# Patient Record
Sex: Male | Born: 1955
Health system: Southern US, Community
[De-identification: ages and names within clinical notes are randomized; demographics above are authoritative.]

## PROBLEM LIST (undated history)

## (undated) DIAGNOSIS — A549 Gonococcal infection, unspecified: Secondary | ICD-10-CM

## (undated) DIAGNOSIS — F200 Paranoid schizophrenia: Secondary | ICD-10-CM

## (undated) DIAGNOSIS — K5900 Constipation, unspecified: Secondary | ICD-10-CM

## (undated) DIAGNOSIS — R2689 Other abnormalities of gait and mobility: Secondary | ICD-10-CM

## (undated) DIAGNOSIS — IMO0002 Reserved for concepts with insufficient information to code with codable children: Secondary | ICD-10-CM

## (undated) DIAGNOSIS — F4310 Post-traumatic stress disorder, unspecified: Secondary | ICD-10-CM

## (undated) DIAGNOSIS — Z7409 Other reduced mobility: Secondary | ICD-10-CM

## (undated) DIAGNOSIS — C801 Malignant (primary) neoplasm, unspecified: Secondary | ICD-10-CM

## (undated) DIAGNOSIS — Z789 Other specified health status: Secondary | ICD-10-CM

## (undated) DIAGNOSIS — N319 Neuromuscular dysfunction of bladder, unspecified: Secondary | ICD-10-CM

## (undated) DIAGNOSIS — G8929 Other chronic pain: Secondary | ICD-10-CM

## (undated) HISTORY — PX: BACK SURGERY: SHX140

---

## 2006-12-13 ENCOUNTER — Emergency Department (HOSPITAL_COMMUNITY): Admission: EM | Admit: 2006-12-13 | Discharge: 2006-12-14 | Payer: Self-pay | Admitting: Emergency Medicine

## 2006-12-13 IMAGING — CR DG CHEST 2V
2 series · 2 of 2 positions shown · non-contrast
Comparison: none

CLINICAL DATA: Swelling in legs.  Weakness.  Fatigue. 
 CHEST ? 2 VIEW:

[w chest pa]
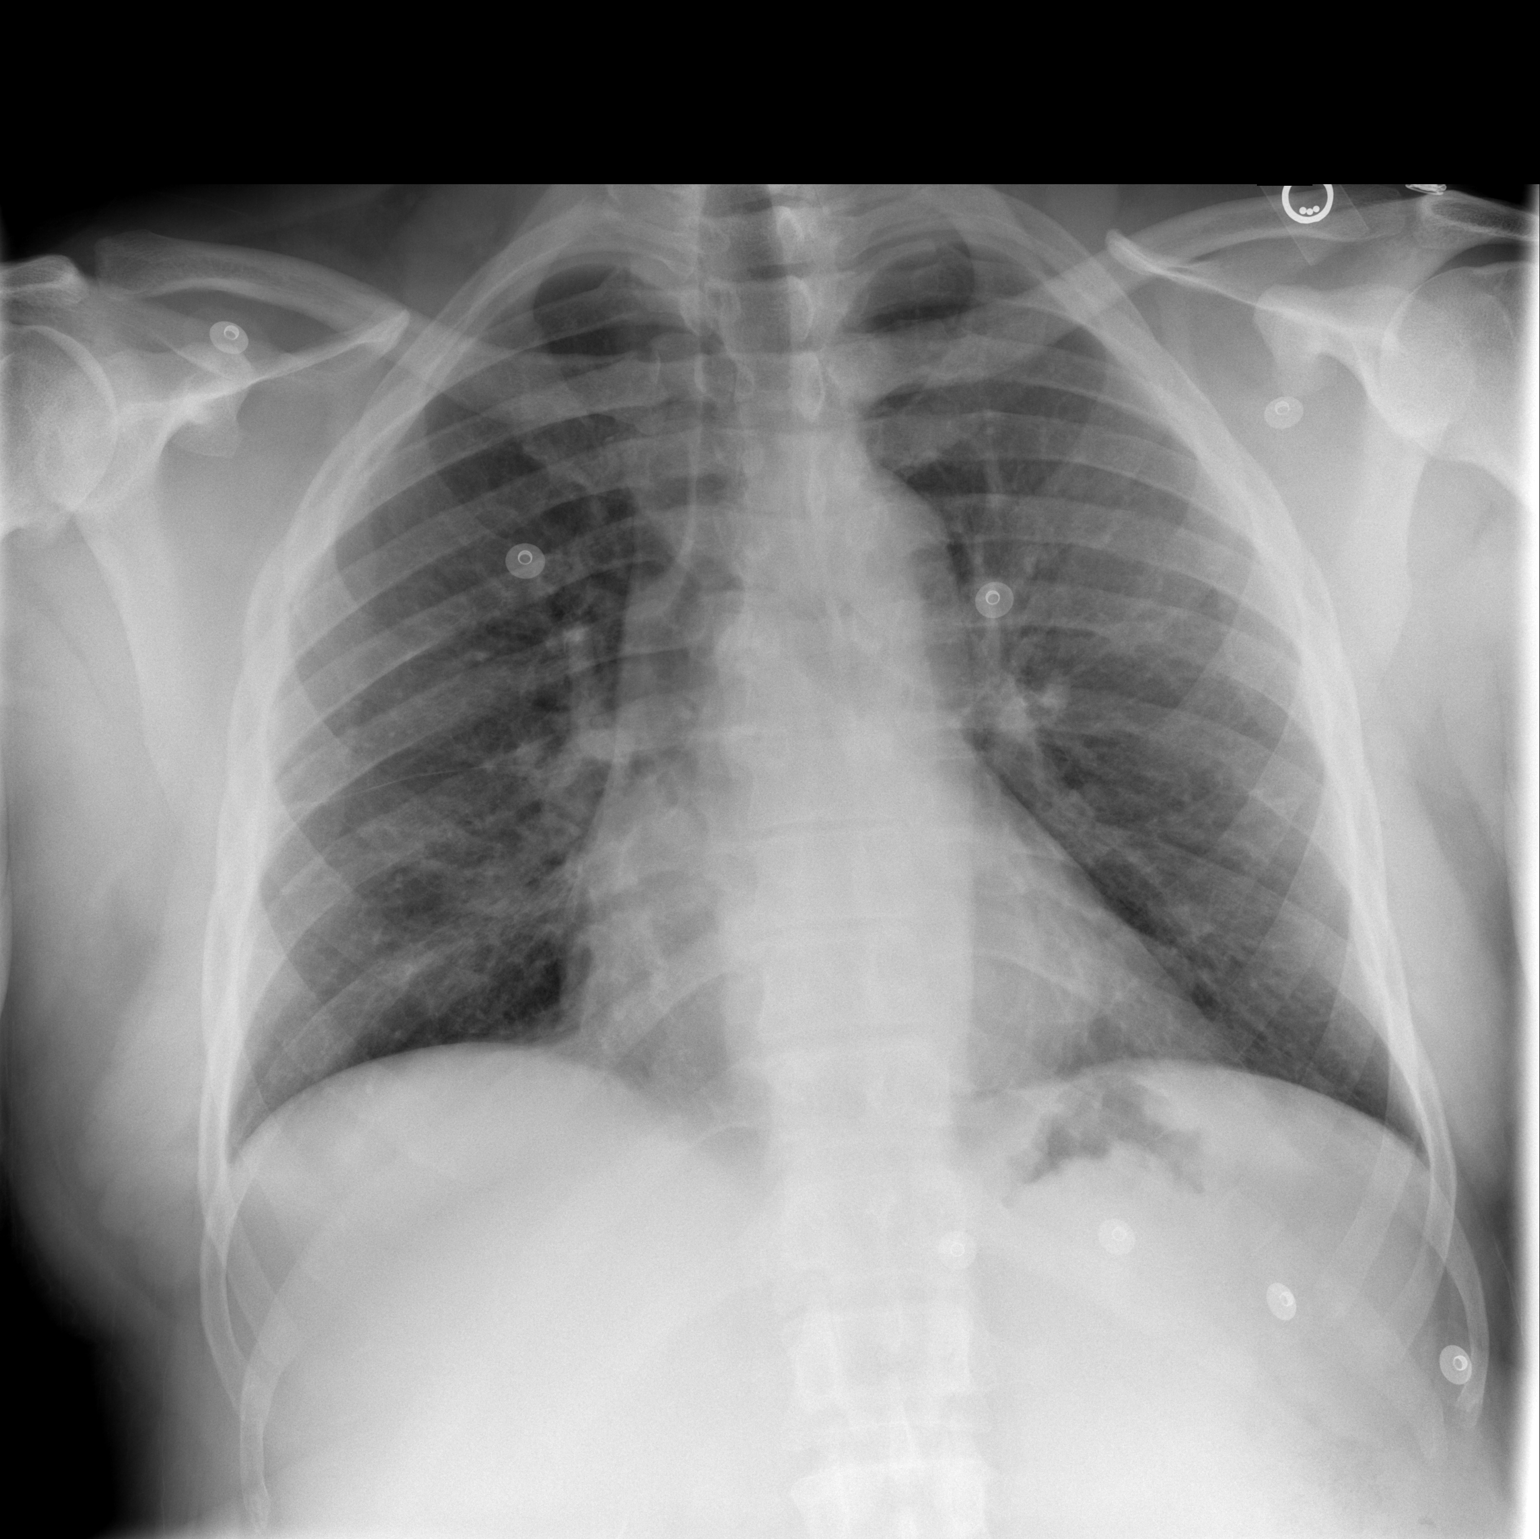

[w chest lat]
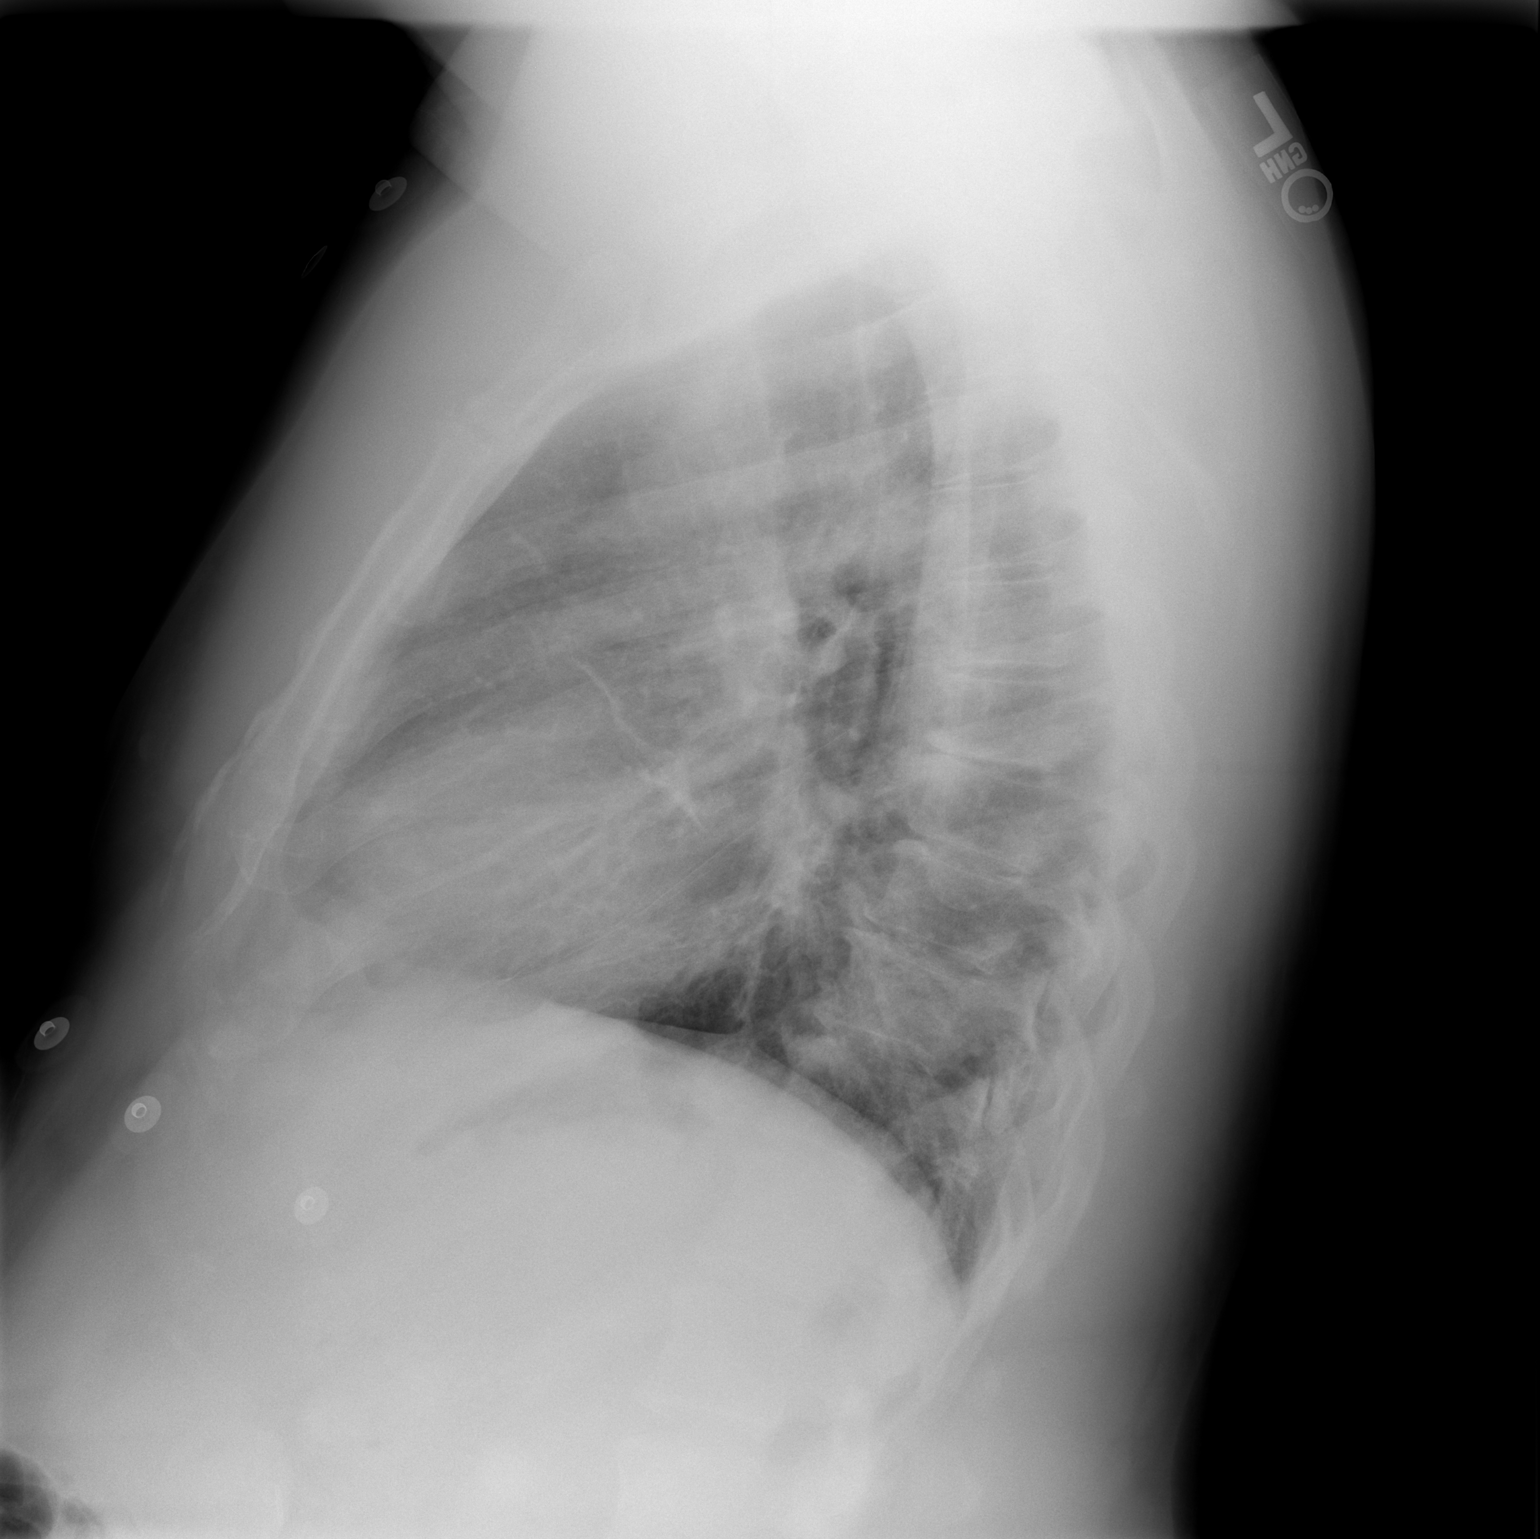

[2 of 2 positions shown; findings below may reference images not displayed]

FINDINGS: Two views of the chest show no active infiltrate or effusion.  The heart is within normal limits in size.   Mild degenerative changes are noted in the thoracic spine.
IMPRESSION: No active lung disease.

## 2011-03-03 LAB — COMPREHENSIVE METABOLIC PANEL
ALT: 30
Albumin: 3.6
Alkaline Phosphatase: 58
BUN: 10
Calcium: 9
Potassium: 3.6
Sodium: 138
Total Protein: 6.4

## 2011-03-03 LAB — DIFFERENTIAL
Basophils Relative: 0
Lymphs Abs: 1.6
Monocytes Absolute: 0.5
Monocytes Relative: 9
Neutro Abs: 2.8

## 2011-03-03 LAB — CBC
Platelets: 248
RDW: 13.4

## 2011-03-03 LAB — B-NATRIURETIC PEPTIDE (CONVERTED LAB): Pro B Natriuretic peptide (BNP): <30

## 2011-03-03 LAB — URINALYSIS, ROUTINE W REFLEX MICROSCOPIC
Protein, ur: NEGATIVE
Urobilinogen, UA: 0.2

## 2011-03-03 LAB — POCT CARDIAC MARKERS: CKMB, poc: 5.1

## 2016-01-21 ENCOUNTER — Emergency Department (HOSPITAL_COMMUNITY)
Admission: EM | Admit: 2016-01-21 | Discharge: 2016-01-21 | Disposition: A | Discharge status: Home / Self Care | Payer: Non-veteran care | Attending: Emergency Medicine | Admitting: Emergency Medicine

## 2016-01-21 ENCOUNTER — Encounter (HOSPITAL_COMMUNITY): Payer: Self-pay | Admitting: Emergency Medicine

## 2016-01-21 DIAGNOSIS — G8929 Other chronic pain: Secondary | ICD-10-CM | POA: Diagnosis not present

## 2016-01-21 DIAGNOSIS — H9202 Otalgia, left ear: Secondary | ICD-10-CM | POA: Insufficient documentation

## 2016-01-21 DIAGNOSIS — M545 Low back pain: Secondary | ICD-10-CM | POA: Diagnosis present

## 2016-01-21 DIAGNOSIS — M549 Dorsalgia, unspecified: Secondary | ICD-10-CM

## 2016-01-21 HISTORY — DX: Malignant (primary) neoplasm, unspecified: C80.1

## 2016-01-21 HISTORY — DX: Gonococcal infection, unspecified: A54.9

## 2016-01-21 HISTORY — DX: Reserved for concepts with insufficient information to code with codable children: IMO0002

## 2016-01-21 NOTE — ED Notes (Signed)
Bed: [id number] Expected date:  Expected time:  Means of arrival:  Comments: EMS- 60yo M, chronic back pain/CA Pt

## 2016-01-21 NOTE — ED Notes (Signed)
Pt ambulated into hallway and asked directions to the bus stop.  Pt reports that he will take the bus to [facility] and knows how to get home from there.  Security to take Pt to bus stop.

## 2016-01-21 NOTE — ED Provider Notes (Signed)
[facility] DEPT Provider Note   CSN: [id number] Arrival date & time: 01/21/16  0746     History   Chief Complaint Chief Complaint  Patient presents with  . Back Pain    HPI Timothy [name] is a 60 y.o. male.  60 yo M with a cc of back pain.  Has a hx of chronic back pain.  On arrival to the ED symptoms resolved. Now requesting I look in his ears.     The history is provided by the patient.  Back Pain   This is a new problem. The current episode started less than 1 hour ago. The problem occurs constantly. The problem has not changed since onset.The pain is associated with no known injury. The pain is present in the lumbar spine. The quality of the pain is described as aching. The pain does not radiate. The pain is at a severity of 6/10. The pain is mild. The symptoms are aggravated by bending. The pain is worse during the day. Pertinent negatives include no chest pain, no fever, no headaches and no abdominal pain. He has tried nothing for the symptoms. The treatment provided no relief.    Past Medical History:  Diagnosis Date  . Behavioral disorder   . Cancer ([place])   . Gonorrhea     There are no active problems to display for this patient.   History reviewed. No pertinent surgical history.     Home Medications    Prior to Admission medications   Not on File    Family History No family history on file.  Social History Social History  Substance Use Topics  . Smoking status: Never Smoker  . Smokeless tobacco: Never Used  . Alcohol use No     Allergies   Review of patient's allergies indicates not on file.   Review of Systems Review of Systems  Constitutional: Negative for chills and fever.  HENT: Negative for congestion and facial swelling.   Eyes: Negative for discharge and visual disturbance.  Respiratory: Negative for shortness of breath.   Cardiovascular: Negative for chest pain and palpitations.  Gastrointestinal: Negative for abdominal pain,  diarrhea and vomiting.  Musculoskeletal: Positive for back pain. Negative for arthralgias and myalgias.  Skin: Negative for color change and rash.  Neurological: Negative for tremors, syncope and headaches.  Psychiatric/Behavioral: Negative for confusion and dysphoric mood.     Physical Exam Updated Vital Signs BP 128/85 (BP Location: Left Arm)   Pulse 86   Temp 98.6 F (37 C) (Oral)   Resp 16   SpO2 97%   Physical Exam  Constitutional: He is oriented to person, place, and time. He appears well-developed and well-nourished.  HENT:  Head: Normocephalic and atraumatic.  Left TM normal  Eyes: Conjunctivae and EOM are normal. Pupils are equal, round, and reactive to light.  Neck: Normal range of motion. No JVD present.  Cardiovascular: Normal rate and regular rhythm.   Pulmonary/Chest: Effort normal. No stridor. No respiratory distress.  Abdominal: He exhibits no distension. There is no tenderness. There is no guarding.  Musculoskeletal: Normal range of motion. He exhibits no edema.  Neurological: He is alert and oriented to person, place, and time.  Skin: Skin is warm and dry.  Psychiatric: He has a normal mood and affect. His behavior is normal.     ED Treatments / Results  Labs (all labs ordered are listed, but only abnormal results are displayed) Labs Reviewed - No data to display  EKG  EKG  Interpretation None       Radiology No results found.  Procedures Procedures (including critical care time)  Medications Ordered in ED Medications - No data to display   Initial Impression / Assessment and Plan / ED Course  I have reviewed the triage vital signs and the nursing notes.  Pertinent labs & imaging results that were available during my care of the patient were reviewed by me and considered in my medical decision making (see chart for details).  Clinical Course    60 yo M with a cc of low back pain. Pain resolved spontaneously prior to my eval.  Patient  with chronic left ear pain but no other complaints, will have follow up with his PCP.   8:09 AM:  I have discussed the diagnosis/risks/treatment options with the patient and believe the pt to be eligible for discharge home to follow-up with PCP. We also discussed returning to the ED immediately if new or worsening sx occur. We discussed the sx which are most concerning (e.g., sudden worsening pain, fever, inability to tolerate by mouth) that necessitate immediate return. Medications administered to the patient during their visit and any new prescriptions provided to the patient are listed below.  Medications given during this visit Medications - No data to display   The patient appears reasonably screen and/or stabilized for discharge and I doubt any other medical condition or other [facility] requiring further screening, evaluation, or treatment in the ED at this time prior to discharge.    Final Clinical Impressions(s) / ED Diagnoses   Final diagnoses:  Otalgia of left ear  Chronic back pain    New Prescriptions New Prescriptions   No medications on file     Deno Etienne, DO 01/21/16 0809

## 2016-01-21 NOTE — ED Triage Notes (Signed)
Per EMS, Pt sts he fell a couple of days ago and hurts his knees and legs. Denies back injury at the time but today c/o back pain and L shoulder pain - at an incision site where he had "back cancer removed" a few years ago. Hx of chronic back pain. Pt also wants to be tested for AIDS. Pt sts he thinks he has AIDS because his caregiver gave him a black and yellow pill. A&Ox4.

## 2016-01-21 NOTE — ED Notes (Signed)
Upon assessment, pt denies any pain at this time. Sts "My pain is gone miraculously. It went away when you took my blood pressure." Pt A&O. Unsure of baseline, When told Dr. Tyrone Nine was the doctor today pt sts "Sleepy floyd?"  Pt sts, "I do think I have AIDS because I've been having lots of stupid sex."

## 2016-01-21 NOTE — ED Notes (Signed)
Pt asking staff to contact caregiver prior to transport.  Staff attempted to call number provided and it went to voicemail.  Pt informed and verbalized understanding.

## 2016-01-31 ENCOUNTER — Emergency Department (HOSPITAL_COMMUNITY)
Admission: EM | Admit: 2016-01-31 | Discharge: 2016-02-02 | Disposition: A | Discharge status: Home / Self Care | Payer: Non-veteran care | Attending: Emergency Medicine | Admitting: Emergency Medicine

## 2016-01-31 ENCOUNTER — Encounter (HOSPITAL_COMMUNITY): Payer: Self-pay | Admitting: Emergency Medicine

## 2016-01-31 DIAGNOSIS — Z0000 Encounter for general adult medical examination without abnormal findings: Secondary | ICD-10-CM

## 2016-01-31 DIAGNOSIS — F200 Paranoid schizophrenia: Secondary | ICD-10-CM | POA: Diagnosis present

## 2016-01-31 DIAGNOSIS — N390 Urinary tract infection, site not specified: Secondary | ICD-10-CM | POA: Insufficient documentation

## 2016-01-31 DIAGNOSIS — K5900 Constipation, unspecified: Secondary | ICD-10-CM

## 2016-01-31 DIAGNOSIS — Z79899 Other long term (current) drug therapy: Secondary | ICD-10-CM | POA: Insufficient documentation

## 2016-01-31 LAB — CBC WITH DIFFERENTIAL/PLATELET
BASOS ABS: 0 10*3/uL (ref 0.0–0.1)
BASOS PCT: 1 %
Eosinophils Absolute: 0.4 10*3/uL (ref 0.0–0.7)
Eosinophils Relative: 6 %
HEMATOCRIT: 40.1 % (ref 39.0–52.0)
HEMOGLOBIN: 13.7 g/dL (ref 13.0–17.0)
LYMPHS PCT: 17 %
Lymphs Abs: 1.3 10*3/uL (ref 0.7–4.0)
MCH: 29.5 pg (ref 26.0–34.0)
MCHC: 34.2 g/dL (ref 30.0–36.0)
MCV: 86.2 fL (ref 78.0–100.0)
MONOS PCT: 7 %
Monocytes Absolute: 0.5 10*3/uL (ref 0.1–1.0)
NEUTROS ABS: 5.4 10*3/uL (ref 1.7–7.7)
NEUTROS PCT: 69 %
Platelets: 264 10*3/uL (ref 150–400)
RBC: 4.65 MIL/uL (ref 4.22–5.81)
RDW: 13.9 % (ref 11.5–15.5)
WBC: 7.7 10*3/uL (ref 4.0–10.5)

## 2016-01-31 LAB — URINALYSIS, ROUTINE W REFLEX MICROSCOPIC
Bilirubin Urine: NEGATIVE
Glucose, UA: NEGATIVE mg/dL
Ketones, ur: NEGATIVE mg/dL
Nitrite: POSITIVE — AB
PROTEIN: NEGATIVE mg/dL
Specific Gravity, Urine: 1.019 (ref 1.005–1.030)
pH: 6.5 (ref 5.0–8.0)

## 2016-01-31 LAB — COMPREHENSIVE METABOLIC PANEL
ALBUMIN: 3.9 g/dL (ref 3.5–5.0)
ALT: 15 U/L — ABNORMAL LOW (ref 17–63)
ANION GAP: 6 (ref 5–15)
AST: 15 U/L (ref 15–41)
Alkaline Phosphatase: 94 U/L (ref 38–126)
BILIRUBIN TOTAL: 0.6 mg/dL (ref 0.3–1.2)
BUN: 7 mg/dL (ref 6–20)
CHLORIDE: 103 mmol/L (ref 101–111)
CO2: 29 mmol/L (ref 22–32)
Calcium: 9.1 mg/dL (ref 8.9–10.3)
Creatinine, Ser: 1.11 mg/dL (ref 0.61–1.24)
GFR calc Af Amer: >60 mL/min (ref >60)
GFR calc non Af Amer: >60 mL/min (ref >60)
GLUCOSE: 103 mg/dL — AB (ref 65–99)
POTASSIUM: 3.9 mmol/L (ref 3.5–5.1)
Sodium: 138 mmol/L (ref 135–145)
TOTAL PROTEIN: 7.3 g/dL (ref 6.5–8.1)

## 2016-01-31 LAB — RAPID URINE DRUG SCREEN, HOSP PERFORMED
Amphetamines: NOT DETECTED
BARBITURATES: NOT DETECTED
BENZODIAZEPINES: NOT DETECTED
COCAINE: NOT DETECTED
Opiates: NOT DETECTED
TETRAHYDROCANNABINOL: POSITIVE — AB

## 2016-01-31 LAB — URINE MICROSCOPIC-ADD ON

## 2016-01-31 LAB — CBG MONITORING, ED: GLUCOSE-CAPILLARY: 90 mg/dL (ref 65–99)

## 2016-01-31 LAB — ETHANOL

## 2016-01-31 MED ORDER — PANTOPRAZOLE SODIUM 40 MG PO TBEC
40.0000 mg | DELAYED_RELEASE_TABLET | Freq: Every day | ORAL | Status: DC
  Administered 2016-02-01 – 2016-02-02 (×2): 40 mg via ORAL
  Filled 2016-01-31 (×2): qty 1

## 2016-01-31 MED ORDER — CALCIUM CARBONATE-VITAMIN D 500-200 MG-UNIT PO TABS
1.0000 | ORAL_TABLET | Freq: Every day | ORAL | Status: DC
  Administered 2016-02-01 – 2016-02-02 (×2): 1 via ORAL
  Filled 2016-01-31 (×2): qty 1

## 2016-01-31 MED ORDER — POLYETHYLENE GLYCOL 3350 17 G PO PACK
17.0000 g | PACK | ORAL | Status: DC
  Administered 2016-02-01: 17 g via ORAL
  Filled 2016-01-31: qty 1

## 2016-01-31 MED ORDER — BISACODYL 5 MG PO TBEC
5.0000 mg | DELAYED_RELEASE_TABLET | Freq: Every day | ORAL | Status: DC | PRN
  Administered 2016-02-02: 5 mg via ORAL
  Filled 2016-01-31 (×2): qty 1

## 2016-01-31 MED ORDER — DIPHENHYDRAMINE HCL 25 MG PO CAPS
25.0000 mg | ORAL_CAPSULE | Freq: Every evening | ORAL | Status: DC | PRN
  Administered 2016-02-01 (×2): 25 mg via ORAL
  Filled 2016-01-31 (×2): qty 1

## 2016-01-31 MED ORDER — SULFAMETHOXAZOLE-TRIMETHOPRIM 800-160 MG PO TABS
1.0000 | ORAL_TABLET | Freq: Two times a day (BID) | ORAL | Status: DC
  Administered 2016-02-01 – 2016-02-02 (×4): 1 via ORAL
  Filled 2016-01-31 (×4): qty 1

## 2016-01-31 MED ORDER — BACLOFEN 5 MG HALF TABLET
5.0000 mg | ORAL_TABLET | Freq: Three times a day (TID) | ORAL | Status: DC | PRN
  Filled 2016-01-31: qty 1

## 2016-01-31 MED ORDER — LACTULOSE 10 GM/15ML PO SOLN
20.0000 g | ORAL | Status: DC
  Administered 2016-02-01: 20 g via ORAL
  Filled 2016-01-31: qty 30

## 2016-01-31 MED ORDER — DIAZEPAM 5 MG PO TABS: 2.5000 mg | ORAL_TABLET | Freq: Every evening | ORAL | Status: DC | PRN

## 2016-01-31 MED ORDER — OXYBUTYNIN CHLORIDE 5 MG PO TABS
5.0000 mg | ORAL_TABLET | Freq: Three times a day (TID) | ORAL | Status: DC | PRN
  Filled 2016-01-31: qty 1

## 2016-01-31 MED ORDER — HALOPERIDOL 5 MG PO TABS
5.0000 mg | ORAL_TABLET | Freq: Every day | ORAL | Status: DC
  Administered 2016-02-01 (×2): 5 mg via ORAL
  Filled 2016-01-31 (×2): qty 1

## 2016-01-31 NOTE — ED Notes (Signed)
Bed: WLPT4 Expected date:  Expected time:  Means of arrival:  Comments: 

## 2016-01-31 NOTE — ED Provider Notes (Signed)
[facility] DEPT Provider Note   CSN: [id number] Arrival date & time: 01/31/16  1749     History   Chief Complaint Chief Complaint  Patient presents with  . Altered Mental Status    HPI Timothy [name] is a 60 y.o. male.  Pt presents to the ED with altered mental status.  The pt's friend is with him and she said that he is usually seen at the [facility] for his schizophrenia.  The pt said that he has been taking his meds as directed, but his friend said that the [place] has been changing his meds around.  She said that he's been seeing things and has been calling the bank multiple times to check and see if he still has money in his account.  He thinks people are stealing from him.  She said that he has been giving it away.        Past Medical History:  Diagnosis Date  . Behavioral disorder   . Cancer ([place])   . Gonorrhea     There are no active problems to display for this patient.   History reviewed. No pertinent surgical history.     Home Medications    Prior to Admission medications   Medication Sig Start Date End Date Taking? Authorizing Provider  baclofen (LIORESAL) 10 MG tablet Take 5 mg by mouth 3 (three) times daily as needed for muscle spasms.   Yes Historical Provider, MD  bisacodyl (BISACODYL) 5 MG EC tablet Take 5 mg by mouth daily as needed for moderate constipation.   Yes Historical Provider, MD  Calcium Carb-Cholecalciferol (CALCIUM-VITAMIN D) 500-400 MG-UNIT TABS Take 1 tablet by mouth daily.   Yes Historical Provider, MD  diazepam (VALIUM) 5 MG tablet Take 2.5 mg by mouth at bedtime as needed for anxiety.   Yes Historical Provider, MD  diphenhydrAMINE (BENADRYL) 25 mg capsule Take 25 mg by mouth at bedtime as needed for itching or sleep.   Yes Historical Provider, MD  haloperidol (HALDOL) 5 MG tablet Take 5 mg by mouth at bedtime.   Yes Historical Provider, MD  haloperidol decanoate (HALDOL DECANOATE) 50 MG/ML injection Inject 50 mg into the muscle every  28 (twenty-eight) days.   Yes Historical Provider, MD  lactulose (CHRONULAC) 10 GM/15ML solution Take 20 g by mouth 3 (three) times a week.   Yes Historical Provider, MD  omeprazole (PRILOSEC) 20 MG capsule Take 40 mg by mouth 2 (two) times daily before a meal.   Yes Historical Provider, MD  oxybutynin (DITROPAN) 5 MG tablet Take 5 mg by mouth 3 (three) times daily as needed for bladder spasms.   Yes Historical Provider, MD  polyethylene glycol (MIRALAX / GLYCOLAX) packet Take 17 g by mouth 3 (three) times a week.   Yes Historical Provider, MD  pseudoephedrine (SUDAFED) 30 MG tablet Take 30 mg by mouth every 6 (six) hours as needed for congestion.   Yes Historical Provider, MD  VIAGRA 100 MG tablet Take 100 mg by mouth as needed for erectile dysfunction.  01/24/16  Yes Historical Provider, MD    Family History No family history on file.  Social History Social History  Substance Use Topics  . Smoking status: Never Smoker  . Smokeless tobacco: Never Used  . Alcohol use No     Allergies   Penicillins   Review of Systems Review of Systems  Psychiatric/Behavioral: Positive for confusion.  All other systems reviewed and are negative.    Physical Exam Updated Vital Signs  BP 133/91 (BP Location: Right Arm)   Pulse 76   Temp 98.5 F (36.9 C) (Oral)   Resp 16   Ht 6\' 2"  (1.88 m)   Wt 250 lb (113.4 kg)   SpO2 98%   BMI 32.10 kg/m   Physical Exam  Constitutional: He is oriented to person, place, and time. He appears well-developed and well-nourished.  HENT:  Head: Normocephalic and atraumatic.  Right Ear: External ear normal.  Left Ear: External ear normal.  Nose: Nose normal.  Mouth/Throat: Oropharynx is clear and moist.  Eyes: Conjunctivae and EOM are normal. Pupils are equal, round, and reactive to light.  Neck: Normal range of motion. Neck supple.  Cardiovascular: Normal rate, regular rhythm, normal heart sounds and intact distal pulses.   Pulmonary/Chest: Effort normal  and breath sounds normal.  Abdominal: Soft. Bowel sounds are normal.  Musculoskeletal: Normal range of motion.  Neurological: He is alert and oriented to person, place, and time.  Psychiatric: His speech is tangential. He is slowed. Thought content is paranoid. Cognition and memory are impaired.  Nursing note and vitals reviewed.    ED Treatments / Results  Labs (all labs ordered are listed, but only abnormal results are displayed) Labs Reviewed  COMPREHENSIVE METABOLIC PANEL - Abnormal; Notable for the following:       Result Value   Glucose, Bld 103 (*)    ALT 15 (*)    All other components within normal limits  URINE RAPID DRUG SCREEN, HOSP PERFORMED - Abnormal; Notable for the following:    Tetrahydrocannabinol POSITIVE (*)    All other components within normal limits  URINALYSIS, ROUTINE W REFLEX MICROSCOPIC (NOT AT [facility]) - Abnormal; Notable for the following:    APPearance CLOUDY (*)    Hgb urine dipstick SMALL (*)    Nitrite POSITIVE (*)    Leukocytes, UA SMALL (*)    All other components within normal limits  URINE MICROSCOPIC-ADD ON - Abnormal; Notable for the following:    Squamous Epithelial / LPF 0-5 (*)    Bacteria, UA MANY (*)    All other components within normal limits  ETHANOL  CBC WITH DIFFERENTIAL/PLATELET  CBG MONITORING, ED    EKG  EKG Interpretation None       Radiology No results found.  Procedures Procedures (including critical care time)  Medications Ordered in ED Medications  baclofen (LIORESAL) tablet 5 mg (not administered)  bisacodyl (DULCOLAX) EC tablet 5 mg (not administered)  Calcium-Vitamin D 500-400 MG-UNIT TABS 1 tablet (not administered)  diazepam (VALIUM) tablet 2.5 mg (not administered)  diphenhydrAMINE (BENADRYL) capsule 25 mg (not administered)  haloperidol (HALDOL) tablet 5 mg (not administered)  lactulose (CHRONULAC) 10 GM/15ML solution 20 g (not administered)  pantoprazole (PROTONIX) EC tablet 40 mg (not  administered)  oxybutynin (DITROPAN) tablet 5 mg (not administered)  polyethylene glycol (MIRALAX / GLYCOLAX) packet 17 g (not administered)  sulfamethoxazole-trimethoprim (BACTRIM DS,SEPTRA DS) 800-160 MG per tablet 1 tablet (not administered)     Initial Impression / Assessment and Plan / ED Course  I have reviewed the triage vital signs and the nursing notes.  Pertinent labs & imaging results that were available during my care of the patient were reviewed by me and considered in my medical decision making (see chart for details).  Clinical Course   Consult placed for TTS.  Pt will be treated for his UTI with Bactrim.    Pt signed out to Dr. Florina Ou pending TTS consult.  Final Clinical Impressions(s) / ED  Diagnoses   Final diagnoses:  Paranoid schizophrenia ([place])  UTI (lower urinary tract infection)    New Prescriptions New Prescriptions   No medications on file     Isla Pence, MD 02/05/16 1349

## 2016-01-31 NOTE — ED Triage Notes (Addendum)
Here via [facility] from car in parking lot. EMS reports altered mental status. Pt denies pain or injury. States that he has been under a lot of stress lately and called 911 because he felt like his BP was high and his heart was racing. Hx of schizophrenia and pt states he takes haldol daily and that he took his dose today. Patient with some confusion in triage.

## 2016-02-01 DIAGNOSIS — F200 Paranoid schizophrenia: Secondary | ICD-10-CM | POA: Diagnosis present

## 2016-02-01 MED ORDER — DICLOFENAC SODIUM 1 % TD GEL
2.0000 g | Freq: Four times a day (QID) | TRANSDERMAL | Status: DC
  Administered 2016-02-01 – 2016-02-02 (×2): 2 g via TOPICAL
  Filled 2016-02-01 (×2): qty 100

## 2016-02-01 NOTE — BHH Counselor (Addendum)
Clinician attempted assessment however pt was too drowsy. Clinician spoke to [place], [place] and asked her to removed TTS consult and resubmit once pt wakes.   Edd Fabian, MS, [facility], [facility] Triage Specialist [phone]

## 2016-02-01 NOTE — ED Notes (Signed)
Assumed care of patient from T. Waters, Therapist, sports.  Patient ambulated from hall to room with walker.  Patient self-cathed himself.  Placed on hospital bed.  Patient alert, cooperative and pleasant.

## 2016-02-01 NOTE — BH Assessment (Addendum)
[facility] Assessment Progress Note [employer] is at capacity per [facility] is at capacity per [place], [place] is at capacity per [facility] is at capacity per [employer]. Sent pt's referral information to [place], [place], [place], [place], [place], Teacher, music, [place], [place].

## 2016-02-01 NOTE — ED Notes (Signed)
Pt performing in and out cath on self, NA at bedside

## 2016-02-01 NOTE — ED Notes (Signed)
Pt's caregiver brought patient food to eat. RN allowed grapes, watermelon, and soda. RN explained to caregiver that outside food isn't allowed per policy but would allow pt to have fruit and drink. Caregiver also brought Voltaren cream from home and placed on pt's knees.

## 2016-02-01 NOTE — ED Notes (Signed)
Bed: WA30 Expected date:  Expected time:  Means of arrival:  Comments: 

## 2016-02-01 NOTE — ED Notes (Signed)
Patient's belongings placed in locker 41.
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
## 2016-02-02 ENCOUNTER — Emergency Department (HOSPITAL_COMMUNITY): Payer: Self-pay

## 2016-02-02 ENCOUNTER — Encounter (HOSPITAL_COMMUNITY): Payer: Self-pay | Admitting: Registered Nurse

## 2016-02-02 DIAGNOSIS — N390 Urinary tract infection, site not specified: Secondary | ICD-10-CM | POA: Insufficient documentation

## 2016-02-02 DIAGNOSIS — F200 Paranoid schizophrenia: Secondary | ICD-10-CM

## 2016-02-02 DIAGNOSIS — K5900 Constipation, unspecified: Secondary | ICD-10-CM

## 2016-02-02 IMAGING — CR DG CHEST 2V
2 series · 2 of 2 positions shown · non-contrast
Comparison: Chest radiograph [DATE]

CLINICAL DATA: Patient with tachycardia.

EXAM:
CHEST  2 VIEW

[w chest pa]
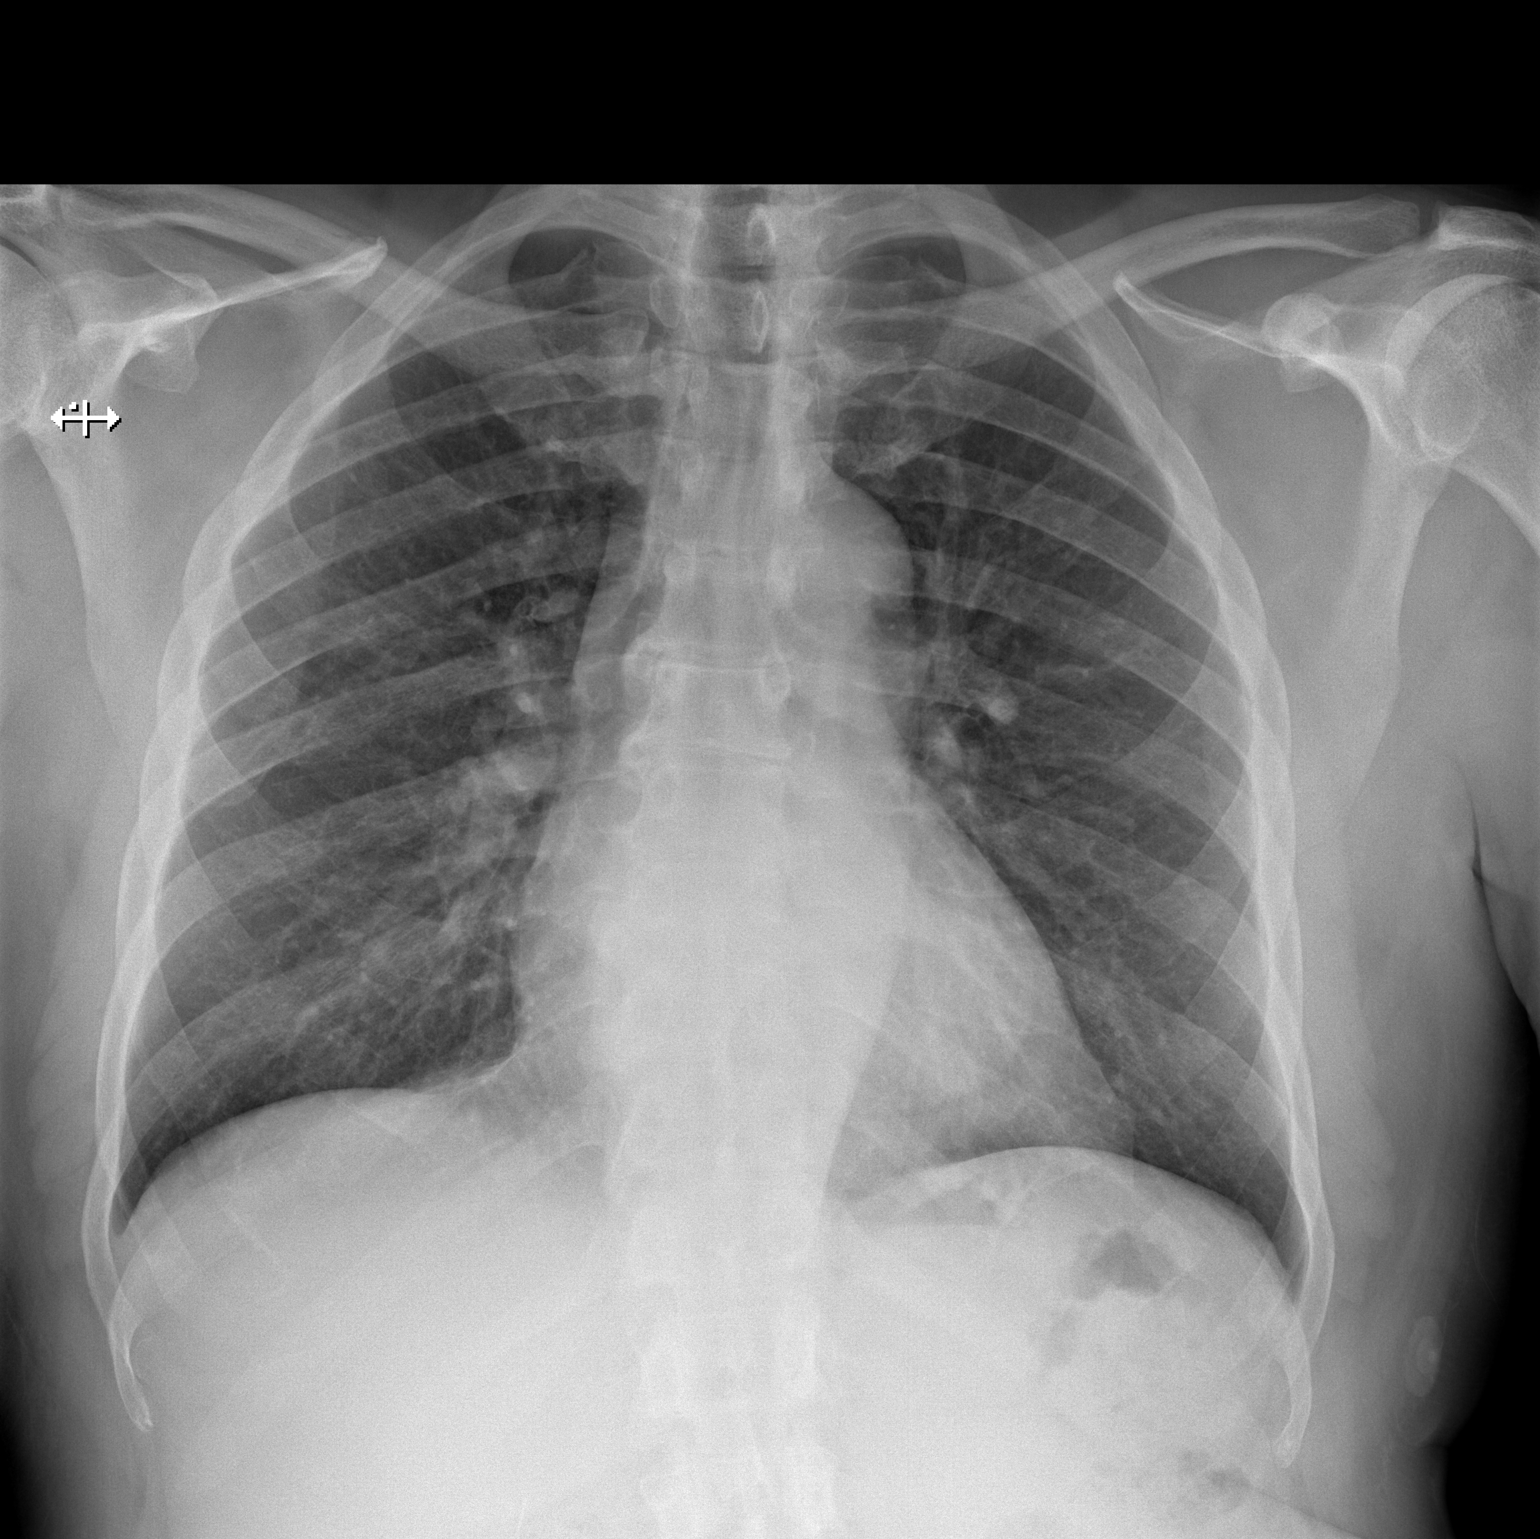

[w chest lat]
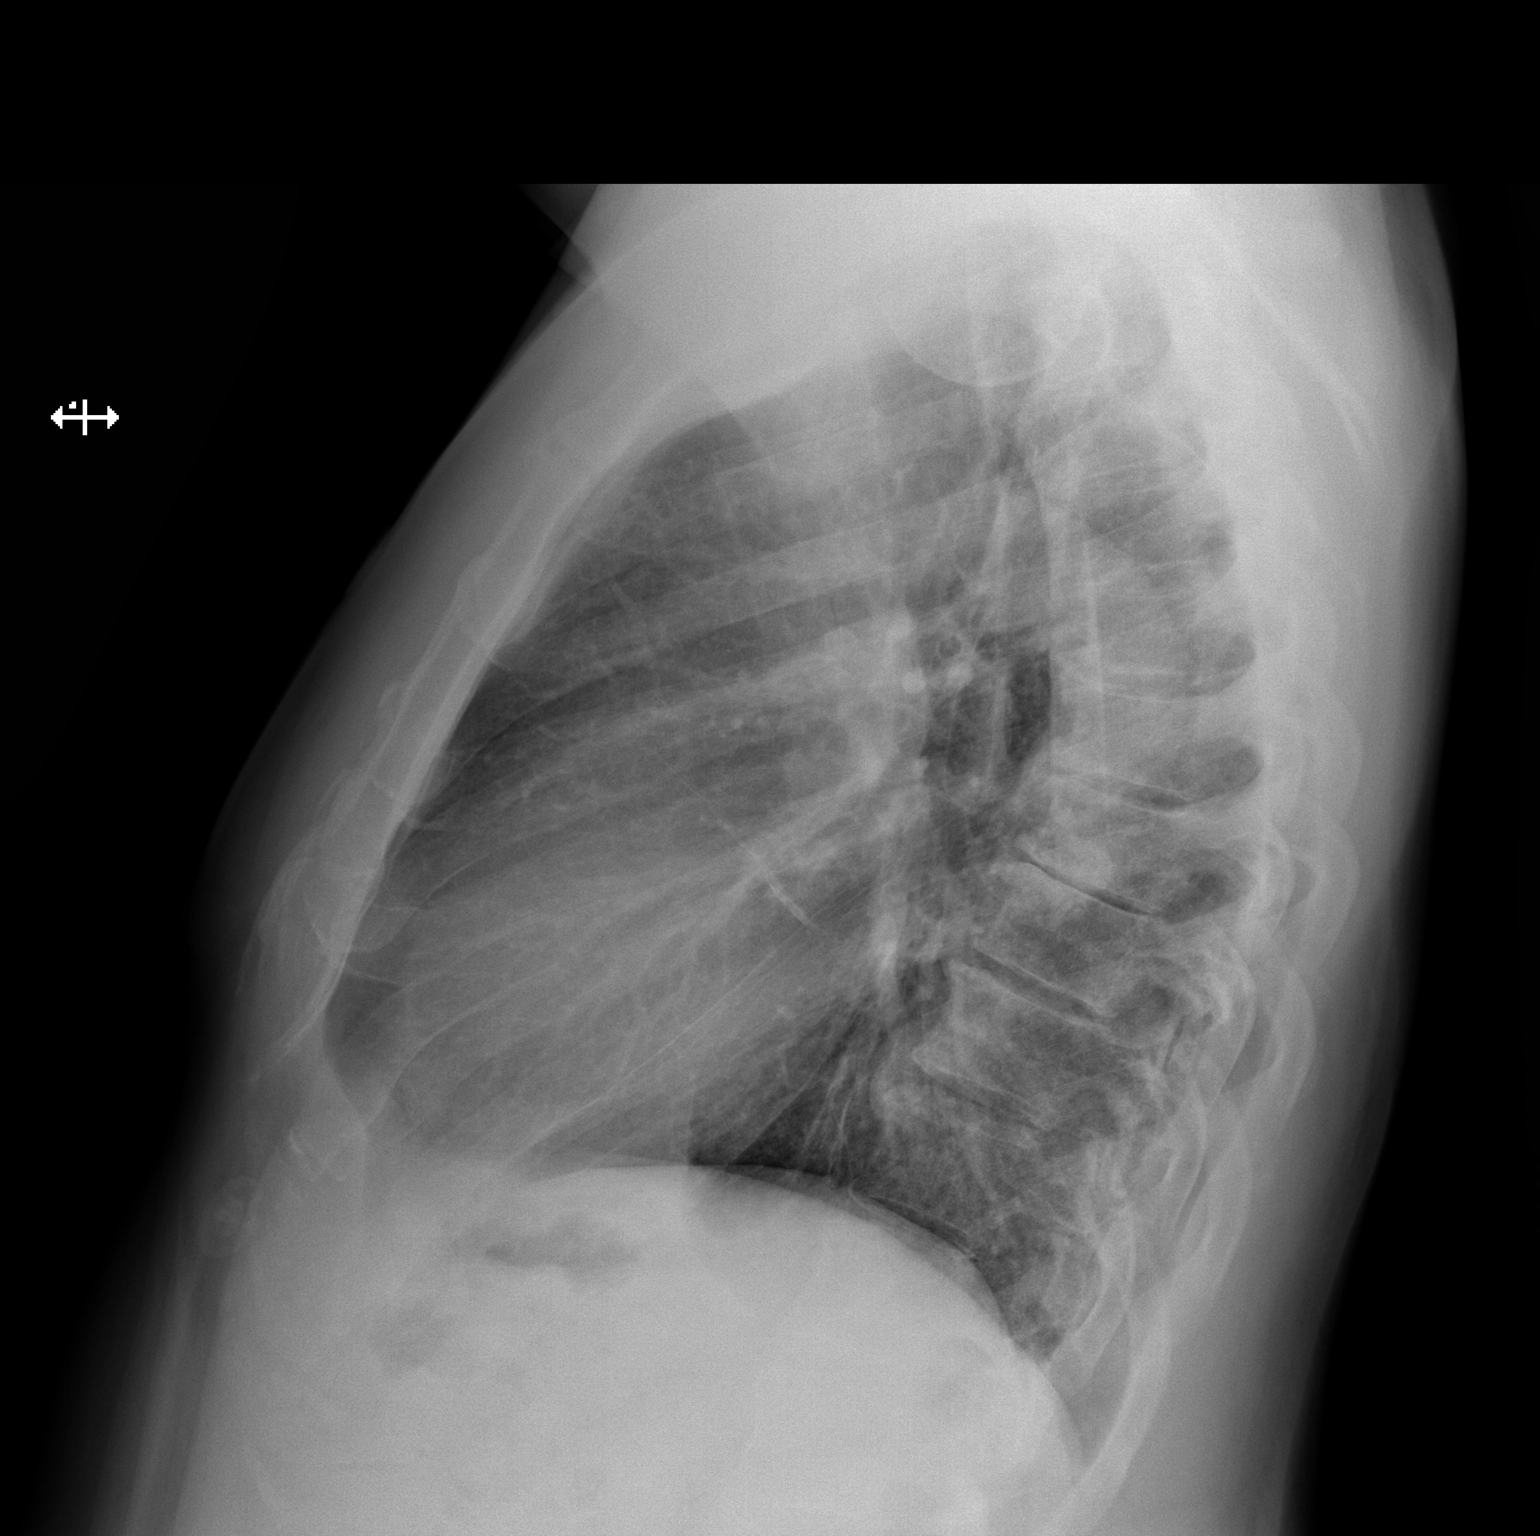

[2 of 2 positions shown; findings below may reference images not displayed]

FINDINGS: Normal cardiac contours. Mild tortuosity of the thoracic aorta. No
consolidative pulmonary opacities. No pleural effusion pneumothorax.
Thoracic spine degenerative changes.
IMPRESSION: No active cardiopulmonary disease.

## 2016-02-02 MED ORDER — SULFAMETHOXAZOLE-TRIMETHOPRIM 800-160 MG PO TABS: 1.0000 | ORAL_TABLET | Freq: Two times a day (BID) | ORAL | 0 refills | Status: DC

## 2016-02-02 MED ORDER — POLYVINYL ALCOHOL 1.4 % OP SOLN
1.0000 [drp] | OPHTHALMIC | Status: DC | PRN
  Filled 2016-02-02: qty 15

## 2016-02-02 NOTE — ED Notes (Signed)
Pt ate 50% of his breakfast 

## 2016-02-02 NOTE — Consult Note (Signed)
[place] Psychiatry Consult   Reason for Consult:  Altered Mental Status Referring Physician:  EDP Patient Identification: Timothy [name] MRN:  [id number] Principal Diagnosis: Constipation Diagnosis:   Patient Active Problem List   Diagnosis Date Noted  . Constipation [K59.00] 02/02/2016  . Paranoid schizophrenia ([place]) [F20.0] 02/01/2016    Total Time spent with patient: 45 minutes  Subjective:   Timothy [name] is a 60 y.o. male patient presented to [facility] via EMS with a friend who stated that patient was not himself and is usually treated at the [place].  HPI:  Timothy [name] 60 y.o. male  Patient seen by Dr. Darleene Cleaver and this provider.  Chart reviewed 02/02/16.   On evaluation:  [name] reports that he was having some constipation and called EMS.  Patient alert, oriented, calm, and cooperative during interview.  Patient denies suicidal/homicidal ideation, psychosis, and paranoia.  Patient voices that his main concern is being constipated.  Reports that he lives with male friend for 63 yrs.      Past Psychiatric History: Reports that he does have a prior history of psychiatric admission in various hospitals and states.  Denies prior history of suicidal ideation/attempt  Risk to Self: Suicidal Ideation: No Suicidal Intent: No Is patient at risk for suicide?: No Suicidal Plan?: No Access to Means: No What has been your use of drugs/alcohol within the last 12 months?: see above Intentional Self Injurious Behavior: None Risk to Others: Homicidal Ideation: No Thoughts of Harm to Others: No Current Homicidal Intent: No Current Homicidal Plan: No Access to Homicidal Means: No History of harm to others?: No Assessment of Violence: None Noted Does patient have access to weapons?: No Criminal Charges Pending?: No Does patient have a court date: No Prior Inpatient Therapy: Prior Inpatient Therapy: Yes Prior Therapy Dates: last IP stay was several years ago Prior  [facility]): [facility] Reason for Treatment: psychosomatic symptoms; delusions Prior Outpatient Therapy: Prior Outpatient Therapy: No Does patient have an ACCT team?: Yes Does patient have Intensive In-House Services?  : No Does patient have [facility] services? : No Does patient have P4CC services?: No  Past Medical History:  Past Medical History:  Diagnosis Date  . Behavioral disorder   . Cancer ([place])   . Gonorrhea    History reviewed. No pertinent surgical history. Family History: History reviewed. No pertinent family history. Family Psychiatric  History: Denies Social History:  History  Alcohol Use No     History  Drug use: Unknown    Social History   Social History  . Marital status: Single    Spouse name: N/A  . Number of children: N/A  . Years of education: N/A   Social History Main Topics  . Smoking status: Never Smoker  . Smokeless tobacco: Never Used  . Alcohol use No  . Drug use: Unknown  . Sexual activity: Not Asked   Other Topics Concern  . None   Social History Narrative  . None   Additional Social History:    Allergies:   Allergies  Allergen Reactions  . Penicillins Anaphylaxis    Has patient had a PCN reaction causing immediate rash, facial/tongue/throat swelling, SOB or lightheadedness with hypotension: Yes Has patient had a PCN reaction causing severe rash involving mucus membranes or skin necrosis: No Has patient had a PCN reaction that required hospitalization: Yes Has patient had a PCN reaction occurring within the last 10 years: No If all of the above answers are "NO", then may  proceed with Cephalosporin use.     Labs:  Results for orders placed or performed during the hospital encounter of 01/31/16 (from the past 48 hour(s))  CBG monitoring, ED     Status: None   Collection Time: 01/31/16  9:04 PM  Result Value Ref Range   Glucose-Capillary 90 65 - 99 mg/dL   Comment 1 Notify RN   Comprehensive metabolic panel      Status: Abnormal   Collection Time: 01/31/16  9:53 PM  Result Value Ref Range   Sodium 138 135 - 145 mmol/L   Potassium 3.9 3.5 - 5.1 mmol/L   Chloride 103 101 - 111 mmol/L   CO2 29 22 - 32 mmol/L   Glucose, Bld 103 (H) 65 - 99 mg/dL   BUN 7 6 - 20 mg/dL   Creatinine, Ser 1.11 0.61 - 1.24 mg/dL   Calcium 9.1 8.9 - 10.3 mg/dL   Total Protein 7.3 6.5 - 8.1 g/dL   Albumin 3.9 3.5 - 5.0 g/dL   AST 15 15 - 41 U/L   ALT 15 (L) 17 - 63 U/L   Alkaline Phosphatase 94 38 - 126 U/L   Total Bilirubin 0.6 0.3 - 1.2 mg/dL   GFR calc non Af Amer >60 >60 mL/min   GFR calc Af Amer >60 >60 mL/min    Comment: (NOTE) The eGFR has been calculated using the CKD EPI equation. This calculation has not been validated in all clinical situations. eGFR's persistently <60 mL/min signify possible Chronic Kidney Disease.    Anion gap 6 5 - 15  Ethanol     Status: None   Collection Time: 01/31/16  9:53 PM  Result Value Ref Range   Alcohol, Ethyl (B) <5 <5 mg/dL    Comment:        LOWEST DETECTABLE LIMIT FOR SERUM ALCOHOL IS 5 mg/dL FOR MEDICAL PURPOSES ONLY   CBC with Diff     Status: None   Collection Time: 01/31/16  9:53 PM  Result Value Ref Range   WBC 7.7 4.0 - 10.5 K/uL   RBC 4.65 4.22 - 5.81 MIL/uL   Hemoglobin 13.7 13.0 - 17.0 g/dL   HCT 40.1 39.0 - 52.0 %   MCV 86.2 78.0 - 100.0 fL   MCH 29.5 26.0 - 34.0 pg   MCHC 34.2 30.0 - 36.0 g/dL   RDW 13.9 11.5 - 15.5 %   Platelets 264 150 - 400 K/uL   Neutrophils Relative % 69 %   Neutro Abs 5.4 1.7 - 7.7 K/uL   Lymphocytes Relative 17 %   Lymphs Abs 1.3 0.7 - 4.0 K/uL   Monocytes Relative 7 %   Monocytes Absolute 0.5 0.1 - 1.0 K/uL   Eosinophils Relative 6 %   Eosinophils Absolute 0.4 0.0 - 0.7 K/uL   Basophils Relative 1 %   Basophils Absolute 0.0 0.0 - 0.1 K/uL  Urine rapid drug screen (hosp performed)not at [facility]     Status: Abnormal   Collection Time: 01/31/16 10:49 PM  Result Value Ref Range   Opiates NONE DETECTED NONE DETECTED    Cocaine NONE DETECTED NONE DETECTED   Benzodiazepines NONE DETECTED NONE DETECTED   Amphetamines NONE DETECTED NONE DETECTED   Tetrahydrocannabinol POSITIVE (A) NONE DETECTED   Barbiturates NONE DETECTED NONE DETECTED    Comment:        DRUG SCREEN FOR MEDICAL PURPOSES ONLY.  IF CONFIRMATION IS NEEDED FOR ANY PURPOSE, NOTIFY LAB WITHIN 5 DAYS.  LOWEST DETECTABLE LIMITS FOR URINE DRUG SCREEN Drug Class       Cutoff (ng/mL) Amphetamine      1000 Barbiturate      200 Benzodiazepine   940 Tricyclics       768 Opiates          300 Cocaine          300 THC              50   Urinalysis, Routine w reflex microscopic (not at [facility])     Status: Abnormal   Collection Time: 01/31/16 10:49 PM  Result Value Ref Range   Color, Urine YELLOW YELLOW   APPearance CLOUDY (A) CLEAR   Specific Gravity, Urine 1.019 1.005 - 1.030   pH 6.5 5.0 - 8.0   Glucose, UA NEGATIVE NEGATIVE mg/dL   Hgb urine dipstick SMALL (A) NEGATIVE   Bilirubin Urine NEGATIVE NEGATIVE   Ketones, ur NEGATIVE NEGATIVE mg/dL   Protein, ur NEGATIVE NEGATIVE mg/dL   Nitrite POSITIVE (A) NEGATIVE   Leukocytes, UA SMALL (A) NEGATIVE  Urine microscopic-add on     Status: Abnormal   Collection Time: 01/31/16 10:49 PM  Result Value Ref Range   Squamous Epithelial / LPF 0-5 (A) NONE SEEN   WBC, UA 0-5 0 - 5 WBC/hpf   RBC / HPF 0-5 0 - 5 RBC/hpf   Bacteria, UA MANY (A) NONE SEEN    Current Facility-Administered Medications  Medication Dose Route Frequency Provider Last Rate Last Dose  . baclofen (LIORESAL) tablet 5 mg  5 mg Oral TID PRN Isla Pence, MD      . bisacodyl (DULCOLAX) EC tablet 5 mg  5 mg Oral Daily PRN Isla Pence, MD   5 mg at 02/02/16 0348  . calcium-vitamin D (OSCAL WITH D) 500-200 MG-UNIT per tablet 1 tablet  1 tablet Oral Daily Isla Pence, MD   1 tablet at 02/02/16 0931  . diazepam (VALIUM) tablet 2.5 mg  2.5 mg Oral QHS PRN Isla Pence, MD      . diclofenac sodium (VOLTAREN) 1 %  transdermal gel 2 g  2 g Topical QID Leo Grosser, MD   2 g at 02/02/16 0931  . diphenhydrAMINE (BENADRYL) capsule 25 mg  25 mg Oral QHS PRN Isla Pence, MD   25 mg at 02/01/16 2111  . haloperidol (HALDOL) tablet 5 mg  5 mg Oral QHS Isla Pence, MD   5 mg at 02/01/16 2111  . lactulose (CHRONULAC) 10 GM/15ML solution 20 g  20 g Oral Once per day on Mon Wed Fri Isla Pence, MD   20 g at 02/01/16 0881  . oxybutynin (DITROPAN) tablet 5 mg  5 mg Oral TID PRN Isla Pence, MD      . pantoprazole (PROTONIX) EC tablet 40 mg  40 mg Oral Daily Isla Pence, MD   40 mg at 02/02/16 0931  . polyethylene glycol (MIRALAX / GLYCOLAX) packet 17 g  17 g Oral Once per day on Mon Wed Fri Isla Pence, MD   17 g at 02/01/16 1031  . polyvinyl alcohol (LIQUIFILM TEARS) 1.4 % ophthalmic solution 1 drop  1 drop Both Eyes PRN Delora Fuel, MD      . sulfamethoxazole-trimethoprim (BACTRIM DS,SEPTRA DS) 800-160 MG per tablet 1 tablet  1 tablet Oral Q12H Isla Pence, MD   1 tablet at 02/02/16 5945   Current Outpatient Prescriptions  Medication Sig Dispense Refill  . baclofen (LIORESAL) 10 MG tablet Take 5 mg by  mouth 3 (three) times daily as needed for muscle spasms.    . bisacodyl (BISACODYL) 5 MG EC tablet Take 5 mg by mouth daily as needed for moderate constipation.    . Calcium Carb-Cholecalciferol (CALCIUM-VITAMIN D) 500-400 MG-UNIT TABS Take 1 tablet by mouth daily.    . diazepam (VALIUM) 5 MG tablet Take 2.5 mg by mouth at bedtime as needed for anxiety.    . diphenhydrAMINE (BENADRYL) 25 mg capsule Take 25 mg by mouth at bedtime as needed for itching or sleep.    . haloperidol (HALDOL) 5 MG tablet Take 5 mg by mouth at bedtime.    . haloperidol decanoate (HALDOL DECANOATE) 50 MG/ML injection Inject 50 mg into the muscle every 28 (twenty-eight) days.    Marland Kitchen lactulose (CHRONULAC) 10 GM/15ML solution Take 20 g by mouth 3 (three) times a week.    Marland Kitchen omeprazole (PRILOSEC) 20 MG capsule Take 40 mg by mouth 2  (two) times daily before a meal.    . oxybutynin (DITROPAN) 5 MG tablet Take 5 mg by mouth 3 (three) times daily as needed for bladder spasms.    . polyethylene glycol (MIRALAX / GLYCOLAX) packet Take 17 g by mouth 3 (three) times a week.    . pseudoephedrine (SUDAFED) 30 MG tablet Take 30 mg by mouth every 6 (six) hours as needed for congestion.    Marland Kitchen VIAGRA 100 MG tablet Take 100 mg by mouth as needed for erectile dysfunction.   1    Musculoskeletal: Strength & Muscle Tone: within normal limits Gait & Station: normal Patient leans: N/A  Psychiatric Specialty Exam: Physical Exam  Constitutional: He is oriented to person, place, and time.  Neck: Normal range of motion.  Respiratory: Effort normal.  Neurological: He is alert and oriented to person, place, and time.    Review of Systems  Psychiatric/Behavioral: Negative for depression (Denies), hallucinations and suicidal ideas. The patient is not nervous/anxious.     Blood pressure 123/73, pulse 83, temperature 97.9 F (36.6 C), temperature source Oral, resp. rate 18, height _0  (1.88 m), weight 113.4 kg (250 lb), SpO2 99 %.Body mass index is 32.1 kg/m.  General Appearance: Casual  Eye Contact:  Good  Speech:  Clear and Coherent and Normal Rate  Volume:  Normal  Mood:  "Good"  Affect:  Appropriate  Thought Process:  Coherent  Orientation:  Full (Time, Place, and Person)  Thought Content:  WDL  Suicidal Thoughts:  No  Homicidal Thoughts:  No  Memory:  Immediate;   Fair Recent;   Fair Remote;   Fair  Judgement:  Fair  Insight:  Present  Psychomotor Activity:  Normal  Concentration:  Concentration: Fair and Attention Span: Fair  Recall:  Good  Fund of Knowledge:  Fair  Language:  Fair  Akathisia:  No  Handed:  Right  AIMS (if indicated):     Assets:  Communication Skills Housing Social Support  ADL's:  Intact  Cognition:  WNL  Sleep:        Treatment Plan Summary: Patient has been cleared by psychiatry.  EDP  may discharge once cleared medically:    Disposition: No evidence of imminent risk to self or others at present.   Patient does not meet criteria for psychiatric inpatient admission.  RankinDelphia Grates, NP 02/02/2016 10:59 AM  Patient seen face-to-face for psychiatric evaluation, chart reviewed and case discussed with the physician extender and developed treatment plan. Reviewed the information documented and agree with the treatment plan. Gamaliel Charney,  MD 

## 2016-02-02 NOTE — ED Notes (Signed)
[employer] called to verify whether or not patient had insurance before reviewing his chart. If pt does not have insurance he will have to pay out of pocket and they wanted to know if the patient is willing to do that. Pt asleep at the moment and caregiver has left. Ivy (psych team) made aware of situation and would pass on in shift report.

## 2016-02-02 NOTE — ED Notes (Signed)
TTS at bedside. 

## 2016-02-02 NOTE — ED Provider Notes (Signed)
Psych team has evaluated pt: no inpt criteria at this time and pt can be discharged. No clear indication for medical admission, with reassuring VS and WBC count. Will add UC, continue to tx UTI with bactrim. Pt has already called Caregiver to come pick him up and take him home. Will d/c stable.    Francine Graven, DO 02/02/16 1236

## 2016-02-02 NOTE — ED Notes (Signed)
Pt's belongings:  Still kept in locker # 30 (TCU).

## 2016-02-02 NOTE — ED Notes (Signed)
Pt belongings in  [address]

## 2016-02-02 NOTE — Discharge Instructions (Signed)
Take the prescription as directed.  Call your regular medical doctor and your mental health provider on Monday to schedule a follow up appointment within the next 3 days.  Return to the Emergency Department immediately sooner if worsening.

## 2016-02-02 NOTE — ED Notes (Signed)
Lou caregiver told case manager he was coming to get pt. Unable to get in touch with him since. Pt sts "I just want to go ahead and catch a  Cab." Pt given his belongings and currently changing.

## 2016-02-02 NOTE — ED Notes (Signed)
Pt appears to be sleeping in bed, covers on patient and lights turned out per request. Respirations even and unlabored.

## 2016-02-02 NOTE — ED Notes (Signed)
Bed: [id number] Expected date:  Expected time:  Means of arrival:  Comments: Pt from TCU

## 2016-02-26 ENCOUNTER — Emergency Department (HOSPITAL_COMMUNITY)
Admission: EM | Admit: 2016-02-26 | Discharge: 2016-02-26 | Disposition: A | Discharge status: Home / Self Care | Payer: Non-veteran care | Attending: Emergency Medicine | Admitting: Emergency Medicine

## 2016-02-26 ENCOUNTER — Encounter (HOSPITAL_COMMUNITY): Payer: Self-pay | Admitting: Emergency Medicine

## 2016-02-26 ENCOUNTER — Emergency Department (HOSPITAL_COMMUNITY): Payer: Non-veteran care

## 2016-02-26 DIAGNOSIS — R1032 Left lower quadrant pain: Secondary | ICD-10-CM | POA: Diagnosis present

## 2016-02-26 DIAGNOSIS — N390 Urinary tract infection, site not specified: Secondary | ICD-10-CM | POA: Diagnosis not present

## 2016-02-26 DIAGNOSIS — R109 Unspecified abdominal pain: Secondary | ICD-10-CM | POA: Insufficient documentation

## 2016-02-26 DIAGNOSIS — Y999 Unspecified external cause status: Secondary | ICD-10-CM | POA: Diagnosis not present

## 2016-02-26 DIAGNOSIS — Y939 Activity, unspecified: Secondary | ICD-10-CM | POA: Diagnosis not present

## 2016-02-26 DIAGNOSIS — Z79899 Other long term (current) drug therapy: Secondary | ICD-10-CM | POA: Insufficient documentation

## 2016-02-26 DIAGNOSIS — Y929 Unspecified place or not applicable: Secondary | ICD-10-CM | POA: Insufficient documentation

## 2016-02-26 DIAGNOSIS — W19XXXA Unspecified fall, initial encounter: Secondary | ICD-10-CM | POA: Insufficient documentation

## 2016-02-26 LAB — CBC WITH DIFFERENTIAL/PLATELET
BASOS ABS: 0.1 10*3/uL (ref 0.0–0.1)
Basophils Relative: 1 %
Eosinophils Absolute: 0.4 10*3/uL (ref 0.0–0.7)
Eosinophils Relative: 5 %
HEMATOCRIT: 36.8 % — AB (ref 39.0–52.0)
Hemoglobin: 12.4 g/dL — ABNORMAL LOW (ref 13.0–17.0)
LYMPHS ABS: 1.3 10*3/uL (ref 0.7–4.0)
LYMPHS PCT: 18 %
MCH: 29.3 pg (ref 26.0–34.0)
MCHC: 33.7 g/dL (ref 30.0–36.0)
MCV: 87 fL (ref 78.0–100.0)
Monocytes Absolute: 0.7 10*3/uL (ref 0.1–1.0)
Monocytes Relative: 10 %
Neutro Abs: 4.6 10*3/uL (ref 1.7–7.7)
Neutrophils Relative %: 66 %
Platelets: 250 10*3/uL (ref 150–400)
RBC: 4.23 MIL/uL (ref 4.22–5.81)
RDW: 13.8 % (ref 11.5–15.5)
WBC: 7 10*3/uL (ref 4.0–10.5)

## 2016-02-26 LAB — COMPREHENSIVE METABOLIC PANEL
ALK PHOS: 91 U/L (ref 38–126)
ALT: 28 U/L (ref 17–63)
AST: 24 U/L (ref 15–41)
Albumin: 3.8 g/dL (ref 3.5–5.0)
Anion gap: 7 (ref 5–15)
BUN: 11 mg/dL (ref 6–20)
CALCIUM: 8.7 mg/dL — AB (ref 8.9–10.3)
CHLORIDE: 102 mmol/L (ref 101–111)
CO2: 27 mmol/L (ref 22–32)
CREATININE: 0.99 mg/dL (ref 0.61–1.24)
GFR calc Af Amer: >60 mL/min (ref >60)
Glucose, Bld: 107 mg/dL — ABNORMAL HIGH (ref 65–99)
Potassium: 3.6 mmol/L (ref 3.5–5.1)
SODIUM: 136 mmol/L (ref 135–145)
Total Bilirubin: 0.4 mg/dL (ref 0.3–1.2)
Total Protein: 6.7 g/dL (ref 6.5–8.1)

## 2016-02-26 LAB — URINALYSIS, ROUTINE W REFLEX MICROSCOPIC
Bilirubin Urine: NEGATIVE
GLUCOSE, UA: NEGATIVE mg/dL
Ketones, ur: NEGATIVE mg/dL
Nitrite: POSITIVE — AB
PH: 6.5 (ref 5.0–8.0)
PROTEIN: NEGATIVE mg/dL
Specific Gravity, Urine: 1.005 (ref 1.005–1.030)

## 2016-02-26 LAB — LIPASE, BLOOD: Lipase: 27 U/L (ref 11–51)

## 2016-02-26 LAB — URINE MICROSCOPIC-ADD ON

## 2016-02-26 IMAGING — CT CT ABD-PELV W/ CM
2 of 5 series · 16 of 46 positions shown, 18 images · IV contrast (iopamidol)
Comparison: No priors.

CLINICAL DATA: 60-year-old male with history of abdominal pain
since 9 a.m. on [DATE]. History of fall on [DATE].

EXAM:
CT ABDOMEN AND PELVIS WITH CONTRAST
TECHNIQUE: Multidetector CT imaging of the abdomen and pelvis was performed
using the standard protocol following bolus administration of
intravenous contrast.
CONTRAST:  100mL [OO] IOPAMIDOL ([OO]) INJECTION 61%

[Series 2: abd/pel with · axial · 0.74mm/px · z∈[-480,-70]mm · 13 of 94 slices shown, 15 images]
[im 6/94  soft-tissue]
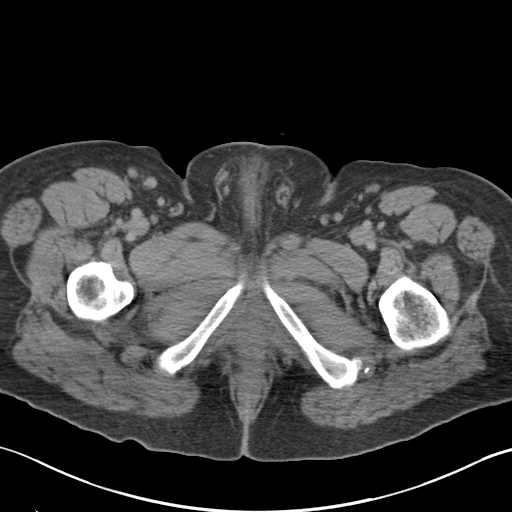
[im 6/94  bone]
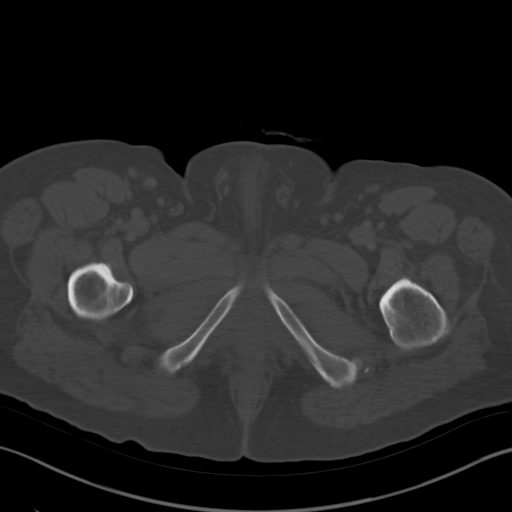
[im 11/94  soft-tissue]
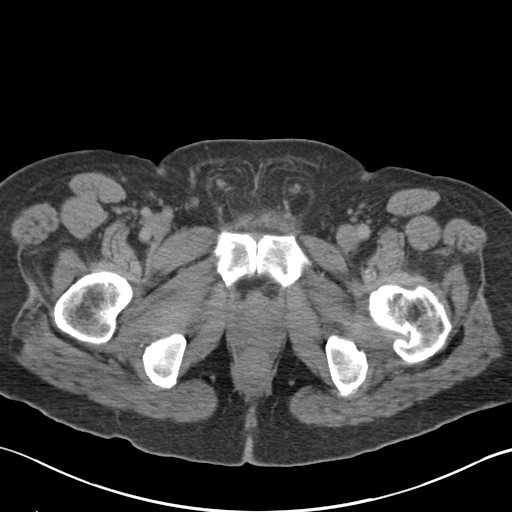
[im 22/94  soft-tissue]
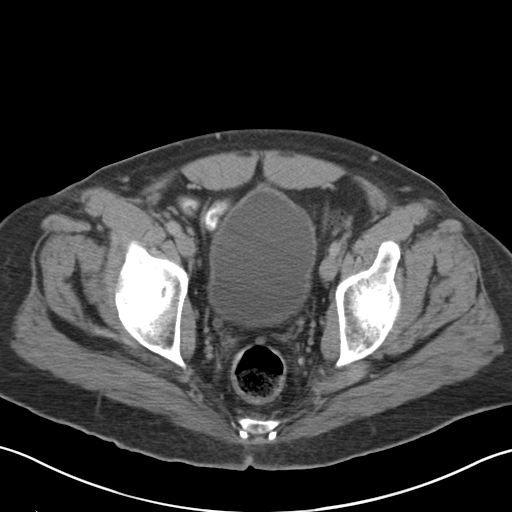
[im 28/94  soft-tissue]
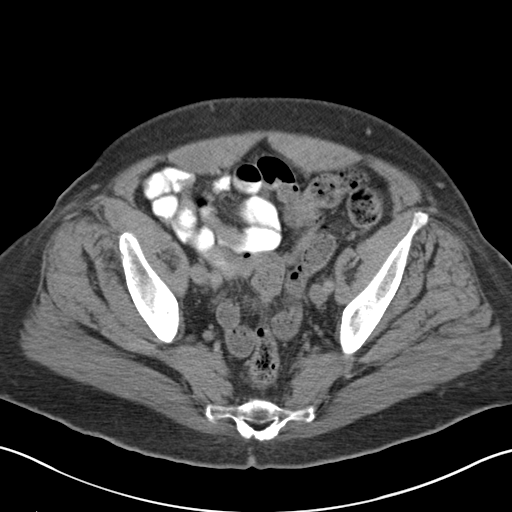
[im 33/94  soft-tissue]
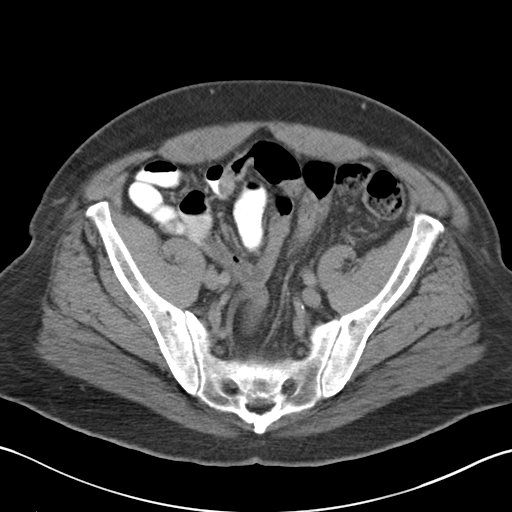
[im 39/94  soft-tissue]
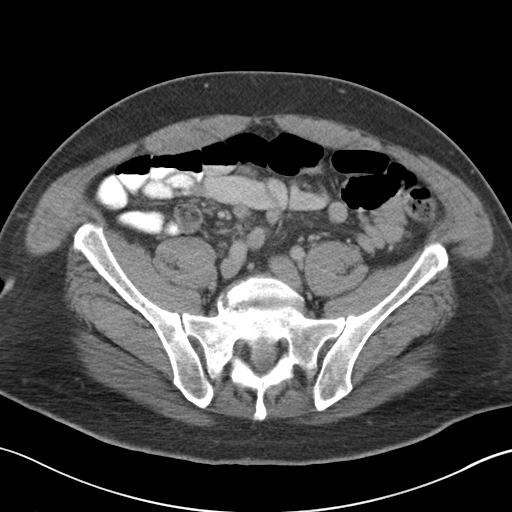
[im 50/94  soft-tissue]
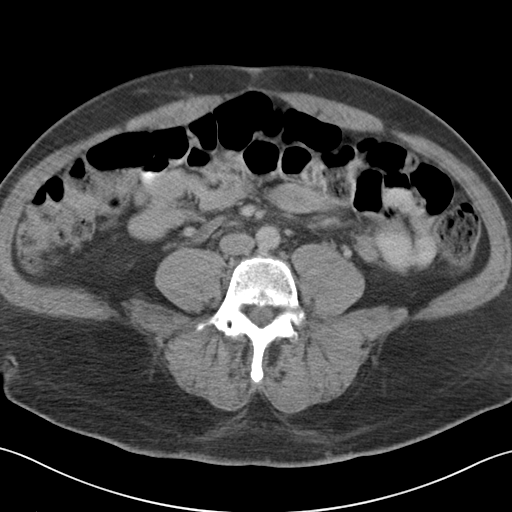
[im 55/94  soft-tissue]
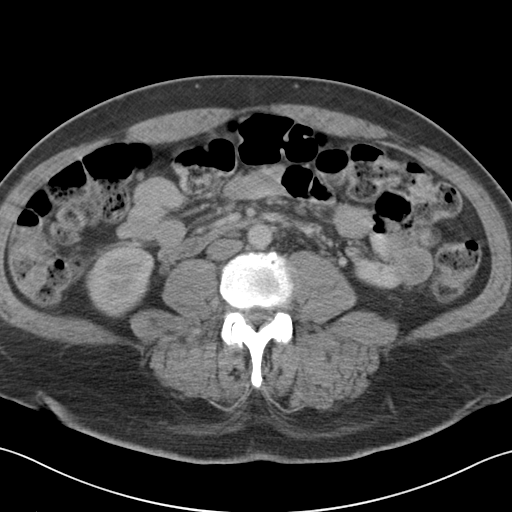
[im 61/94  soft-tissue]
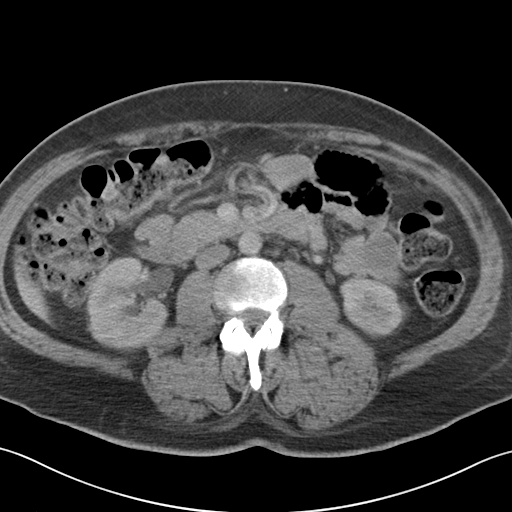
[im 61/94  bone]
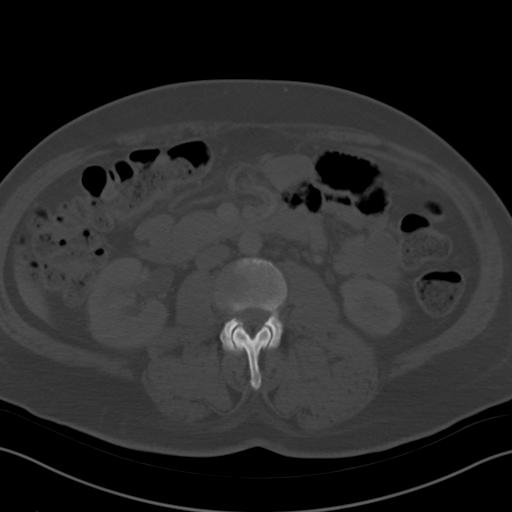
[im 66/94  soft-tissue]
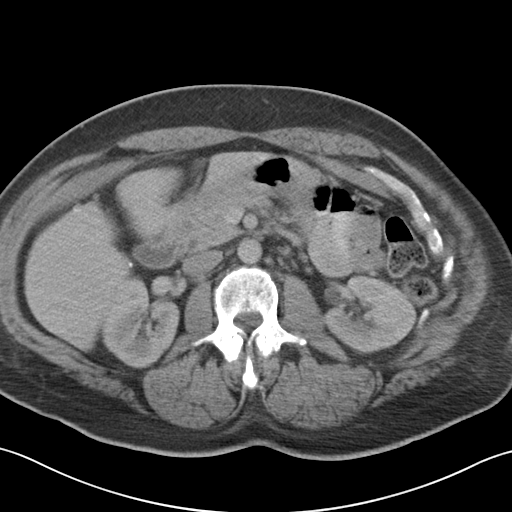
[im 72/94  soft-tissue]
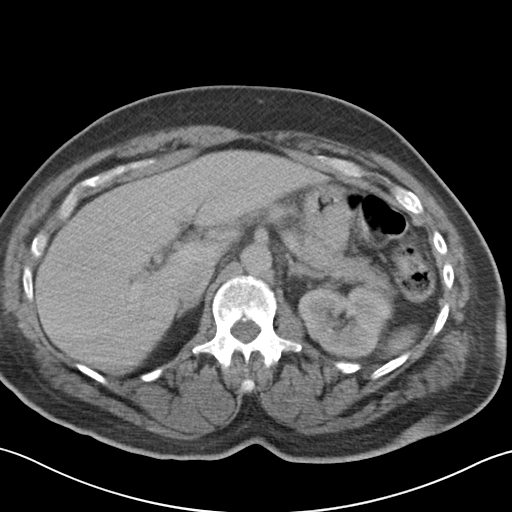
[im 83/94  soft-tissue]
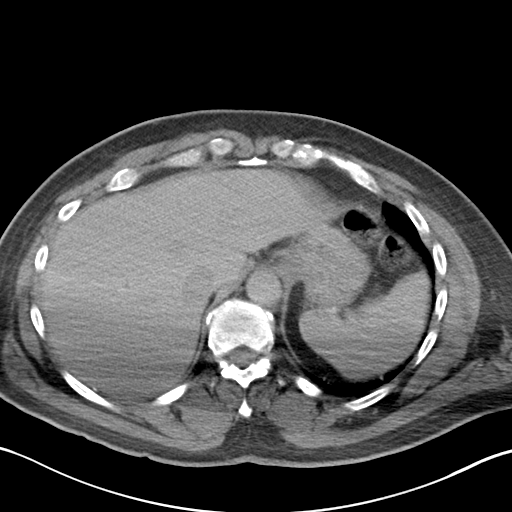
[im 88/94  soft-tissue]
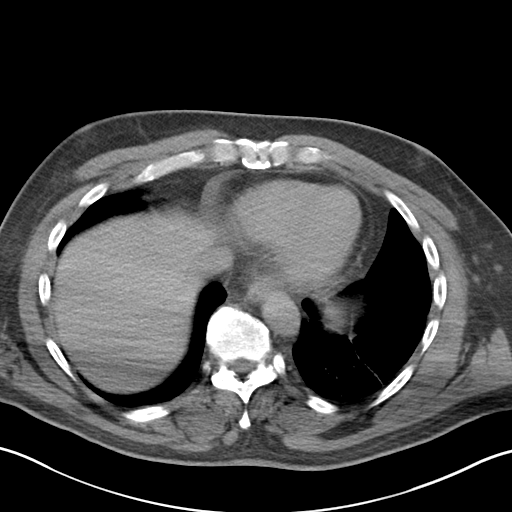

[Series 4: coronal a/|p · coronal · 0.74mm/px · 3 of 145 slices shown]
[im 49/145  soft-tissue]
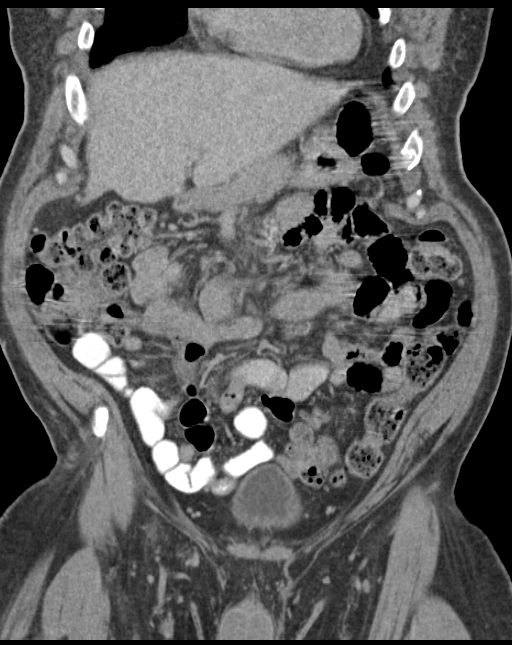
[im 65/145  soft-tissue]
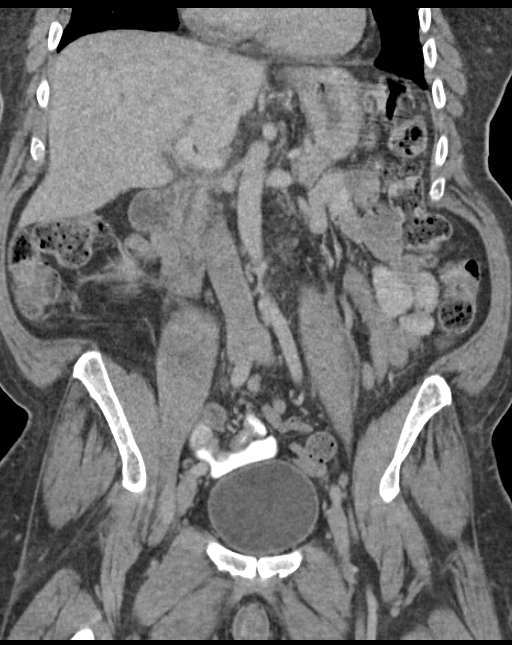
[im 81/145  soft-tissue]
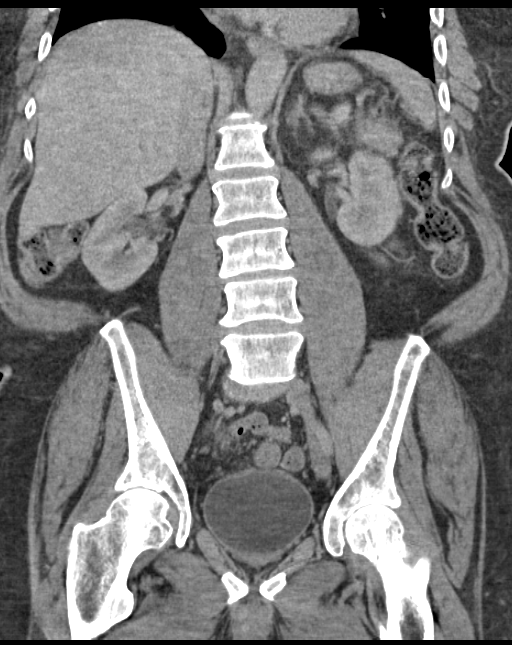

[16 of 46 positions shown; findings below may reference images not displayed]

FINDINGS: Lower chest: Mild scarring in the left lower lobe.

Hepatobiliary: Capsular calcifications overlying the right lobe of
the liver, likely sequela of remote trauma. No discrete cystic or
solid hepatic lesions. No intra or extrahepatic biliary ductal
dilatation. Status post cholecystectomy.

Pancreas: No pancreatic mass. No pancreatic ductal dilatation. No
pancreatic or peripancreatic fluid or inflammatory changes.

Spleen: Unremarkable.

Adrenals/Urinary Tract: Bilateral adrenal glands and bilateral
kidneys are normal in appearance. No hydroureteronephrosis. Urinary
bladder is normal in appearance.

Stomach/Bowel: The appearance of the stomach is normal. There is no
pathologic dilatation of small bowel or colon. Normal appendix.

Vascular/Lymphatic: Atherosclerosis in the abdominal aorta (mild),
without evidence of aneurysm or dissection in the abdominal or
pelvic vasculature. No lymphadenopathy noted in the abdomen or
pelvis.

Reproductive: Prostate gland and seminal vesicles are unremarkable
in appearance.

Other: No significant volume of ascites.  No pneumoperitoneum.

Musculoskeletal: There are no aggressive appearing lytic or blastic
lesions noted in the visualized portions of the skeleton.
IMPRESSION: 1. No acute findings in the abdomen or pelvis to account for the
patient's symptoms.
2. Normal appendix.
3. Aortic atherosclerosis (mild).
4. Additional incidental findings, as above.

## 2016-02-26 MED ORDER — IOPAMIDOL (ISOVUE-300) INJECTION 61%
100.0000 mL | Freq: Once | INTRAVENOUS | Status: AC | PRN
  Administered 2016-02-26: 100 mL via INTRAVENOUS

## 2016-02-26 MED ORDER — IOPAMIDOL (ISOVUE-300) INJECTION 61%
15.0000 mL | Freq: Once | INTRAVENOUS | Status: AC | PRN
  Administered 2016-02-26: 15 mL via ORAL

## 2016-02-26 MED ORDER — CEPHALEXIN 500 MG PO CAPS: 500.0000 mg | ORAL_CAPSULE | Freq: Four times a day (QID) | ORAL | 0 refills | Status: DC

## 2016-02-26 MED ORDER — CEPHALEXIN 500 MG PO CAPS
500.0000 mg | ORAL_CAPSULE | Freq: Once | ORAL | Status: AC
  Administered 2016-02-26: 500 mg via ORAL
  Filled 2016-02-26: qty 1

## 2016-02-26 NOTE — ED Notes (Signed)
Bed: [facility] Expected date:  Expected time:  Means of arrival:  Comments: 60 yo M Abd Pain

## 2016-02-26 NOTE — ED Notes (Signed)
Pt stated he is unable to give a urine sample pt stated he needed a catheter Sheran Luz made aware.

## 2016-02-26 NOTE — ED Provider Notes (Addendum)
[place] [facility] DEPT Provider Note   CSN: [id number] Arrival date & time: 02/26/16  0424     History   Chief Complaint Chief Complaint  Patient presents with  . Abdominal Pain    HPI Timothy [name] is a 60 y.o. male.  Patient has a history of schizophrenia and also history of urinary tract infections. He states that he fell and noted pain in his left lower abdomen. He denies actually hitting his abdomen. He denies nausea, vomiting. He denies constipation or diarrhea. He has had some sweats during the day but no fever or chills. He states he hit his head but denies loss of consciousness.   The history is provided by the patient.    Past Medical History:  Diagnosis Date  . Behavioral disorder   . Cancer ([place])   . Gonorrhea     Patient Active Problem List   Diagnosis Date Noted  . Constipation 02/02/2016  . UTI (lower urinary tract infection)   . Paranoid schizophrenia ([place]) 02/01/2016    No past surgical history on file.     Home Medications    Prior to Admission medications   Medication Sig Start Date End Date Taking? Authorizing Provider  baclofen (LIORESAL) 10 MG tablet Take 5 mg by mouth 3 (three) times daily as needed for muscle spasms.    Historical Provider, MD  bisacodyl (BISACODYL) 5 MG EC tablet Take 5 mg by mouth daily as needed for moderate constipation.    Historical Provider, MD  Calcium Carb-Cholecalciferol (CALCIUM-VITAMIN D) 500-400 MG-UNIT TABS Take 1 tablet by mouth daily.    Historical Provider, MD  diazepam (VALIUM) 5 MG tablet Take 2.5 mg by mouth at bedtime as needed for anxiety.    Historical Provider, MD  diphenhydrAMINE (BENADRYL) 25 mg capsule Take 25 mg by mouth at bedtime as needed for itching or sleep.    Historical Provider, MD  haloperidol (HALDOL) 5 MG tablet Take 5 mg by mouth at bedtime.    Historical Provider, MD  haloperidol decanoate (HALDOL DECANOATE) 50 MG/ML injection Inject 50 mg into the muscle every 28 (twenty-eight)  days.    Historical Provider, MD  lactulose (CHRONULAC) 10 GM/15ML solution Take 20 g by mouth 3 (three) times a week.    Historical Provider, MD  omeprazole (PRILOSEC) 20 MG capsule Take 40 mg by mouth 2 (two) times daily before a meal.    Historical Provider, MD  oxybutynin (DITROPAN) 5 MG tablet Take 5 mg by mouth 3 (three) times daily as needed for bladder spasms.    Historical Provider, MD  polyethylene glycol (MIRALAX / GLYCOLAX) packet Take 17 g by mouth 3 (three) times a week.    Historical Provider, MD  pseudoephedrine (SUDAFED) 30 MG tablet Take 30 mg by mouth every 6 (six) hours as needed for congestion.    Historical Provider, MD  sulfamethoxazole-trimethoprim (BACTRIM DS) 800-160 MG tablet Take 1 tablet by mouth 2 (two) times daily. 02/02/16   Francine Graven, DO  VIAGRA 100 MG tablet Take 100 mg by mouth as needed for erectile dysfunction.  01/24/16   Historical Provider, MD    Family History No family history on file.  Social History Social History  Substance Use Topics  . Smoking status: Never Smoker  . Smokeless tobacco: Never Used  . Alcohol use No     Allergies   Penicillins   Review of Systems Review of Systems  All other systems reviewed and are negative.    Physical Exam Updated Vital  Signs BP 124/78 (BP Location: Left Arm)   Pulse 93   Temp 98.1 F (36.7 C) (Oral)   Resp 16   SpO2 100%   Physical Exam  Nursing note and vitals reviewed.  60 year old male, resting comfortably and in no acute distress. Vital signs are Normal. Oxygen saturation is 100%, which is normal. Head is normocephalic and atraumatic. PERRLA, EOMI. Oropharynx is clear. Neck is nontender and supple without adenopathy or JVD. Back is nontender and there is no CVA tenderness. Lungs are clear without rales, wheezes, or rhonchi. Chest is nontender. Heart has regular rate and rhythm without murmur. Abdomen is soft, flat, with tenderness well localized in the left lower quadrant.  There is no rebound or guarding. There is no ecchymosis present. There are no masses or hepatosplenomegaly and peristalsis is hypoactive. Extremities have no cyanosis or edema, full range of motion is present. Skin is warm and dry without rash. Neurologic: Mental status is normal, cranial nerves are intact, there are no motor or sensory deficits.  ED Treatments / Results  Labs (all labs ordered are listed, but only abnormal results are displayed) Labs Reviewed  COMPREHENSIVE METABOLIC PANEL - Abnormal; Notable for the following:       Result Value   Glucose, Bld 107 (*)    Calcium 8.7 (*)    All other components within normal limits  CBC WITH DIFFERENTIAL/PLATELET - Abnormal; Notable for the following:    Hemoglobin 12.4 (*)    HCT 36.8 (*)    All other components within normal limits  URINALYSIS, ROUTINE W REFLEX MICROSCOPIC (NOT AT [facility]) - Abnormal; Notable for the following:    Hgb urine dipstick TRACE (*)    Nitrite POSITIVE (*)    Leukocytes, UA SMALL (*)    All other components within normal limits  URINE MICROSCOPIC-ADD ON - Abnormal; Notable for the following:    Squamous Epithelial / LPF 0-5 (*)    Bacteria, UA MANY (*)    All other components within normal limits  LIPASE, BLOOD   Radiology Ct Abdomen Pelvis W Contrast  Result Date: 02/26/2016 CLINICAL DATA:  60 year old male with history of abdominal pain since 9 a.m. on 02/25/2016. History of fall on 02/25/2016. EXAM: CT ABDOMEN AND PELVIS WITH CONTRAST TECHNIQUE: Multidetector CT imaging of the abdomen and pelvis was performed using the standard protocol following bolus administration of intravenous contrast. CONTRAST:  151mL ISOVUE-300 IOPAMIDOL (ISOVUE-300) INJECTION 61% COMPARISON:  No priors. FINDINGS: Lower chest: Mild scarring in the left lower lobe. Hepatobiliary: Capsular calcifications overlying the right lobe of the liver, likely sequela of remote trauma. No discrete cystic or solid hepatic lesions. No intra or  extrahepatic biliary ductal dilatation. Status post cholecystectomy. Pancreas: No pancreatic mass. No pancreatic ductal dilatation. No pancreatic or peripancreatic fluid or inflammatory changes. Spleen: Unremarkable. Adrenals/Urinary Tract: Bilateral adrenal glands and bilateral kidneys are normal in appearance. No hydroureteronephrosis. Urinary bladder is normal in appearance. Stomach/Bowel: The appearance of the stomach is normal. There is no pathologic dilatation of small bowel or colon. Normal appendix. Vascular/Lymphatic: Atherosclerosis in the abdominal aorta (mild), without evidence of aneurysm or dissection in the abdominal or pelvic vasculature. No lymphadenopathy noted in the abdomen or pelvis. Reproductive: Prostate gland and seminal vesicles are unremarkable in appearance. Other: No significant volume of ascites.  No pneumoperitoneum. Musculoskeletal: There are no aggressive appearing lytic or blastic lesions noted in the visualized portions of the skeleton. IMPRESSION: 1. No acute findings in the abdomen or pelvis to account for  the patient's symptoms. 2. Normal appendix. 3. Aortic atherosclerosis (mild). 4. Additional incidental findings, as above. Electronically Signed   By: Vinnie Langton M.D.   On: 02/26/2016 07:44    Procedures Procedures (including critical care time)  Medications Ordered in ED Medications  iopamidol (ISOVUE-300) 61 % injection 15 mL (15 mLs Oral Contrast Given 02/26/16 0522)  iopamidol (ISOVUE-300) 61 % injection 100 mL (100 mLs Intravenous Contrast Given 02/26/16 0704)     Initial Impression / Assessment and Plan / ED Course  I have reviewed the triage vital signs and the nursing notes.  Pertinent labs & imaging results that were available during my care of the patient were reviewed by me and considered in my medical decision making (see chart for details).  Clinical Course   Abdominal pain of uncertain cause. I did not see any evidence of trauma. Patient  is a poor and unreliable historian. Will send for CT to make sure there is no serious intra-abdominal process.  CT is unremarkable as is laboratory workup. Patient is reassured that there is no serious intra-abdominal injury or pathology. Advised to use acetaminophen as needed for pain. Incidental finding of urinary tract infection with positive nitrite and many bacteria. He is discharged with prescription for cephalexin. Return should symptoms worsen.  Final Clinical Impressions(s) / ED Diagnoses   Final diagnoses:  Abdominal pain, unspecified abdominal location  Fall, initial encounter    New Prescriptions New Prescriptions   No medications on file     Delora Fuel, MD [id number] [phone]    Delora Fuel, MD [id number] [id number]

## 2016-02-26 NOTE — ED Notes (Signed)
Bed: [facility] Expected date:  Expected time:  Means of arrival:  Comments: 

## 2016-02-26 NOTE — ED Notes (Signed)
[facility]- patients caregiver: [phone] and [phone] Writer called x2 with no answer either number

## 2016-02-26 NOTE — ED Notes (Signed)
Patient transported to CT 

## 2016-02-26 NOTE — ED Notes (Signed)
Patient was able to use urinal to collect sample

## 2016-02-26 NOTE — ED Triage Notes (Signed)
Patient BIB [facility] from home for abdominal pain that started yesterday around 9am. Patient reported to EMS that he fell earlier yesterday, EMS reports no signs of injury. Patient also c/o loss of sensation in lower legs at times but has no difficulty moving them. Patient ambulates with a walker. Has hx of behavioral issues and states he takes a haldol injection daily. Patient has a caregiver at home that was not present when patient was picked up. EMS states caregiver was asleep.

## 2016-02-26 NOTE — ED Notes (Signed)
Patient waiting for caregiver to pick him up.

## 2016-02-26 NOTE — Discharge Instructions (Addendum)
Take acetaminophen as needed for pain. ° °Return if symptoms are getting worse. °

## 2016-02-27 ENCOUNTER — Encounter (HOSPITAL_COMMUNITY): Payer: Self-pay | Admitting: Emergency Medicine

## 2016-02-27 ENCOUNTER — Emergency Department (HOSPITAL_COMMUNITY)
Admission: EM | Admit: 2016-02-27 | Discharge: 2016-02-28 | Disposition: A | Discharge status: Home / Self Care | Payer: Non-veteran care | Attending: Dermatology | Admitting: Dermatology

## 2016-02-27 DIAGNOSIS — Z5321 Procedure and treatment not carried out due to patient leaving prior to being seen by health care provider: Secondary | ICD-10-CM | POA: Diagnosis not present

## 2016-02-27 DIAGNOSIS — K5900 Constipation, unspecified: Secondary | ICD-10-CM | POA: Diagnosis present

## 2016-02-27 DIAGNOSIS — Z859 Personal history of malignant neoplasm, unspecified: Secondary | ICD-10-CM | POA: Insufficient documentation

## 2016-02-27 NOTE — ED Notes (Signed)
Pt approached RN and states he has been able to have a BM since arriving and is going home because symptoms has resolved; Pt was seen exiting the front door by RN

## 2016-02-27 NOTE — ED Triage Notes (Signed)
Patient here with problems with constipation.  He states that he has taken some lactulose without any effect.  Patient states that he heard his bladder rumble and then has a small BM.

## 2016-02-28 LAB — URINE CULTURE

## 2016-02-29 ENCOUNTER — Telehealth (HOSPITAL_BASED_OUTPATIENT_CLINIC_OR_DEPARTMENT_OTHER): Payer: Self-pay

## 2016-02-29 NOTE — Telephone Encounter (Signed)
Post ED Visit - Positive Culture Follow-up  Culture report reviewed by antimicrobial stewardship pharmacist:  []  Elenor Quinones, Pharm.D. []  Heide Guile, Pharm.D., BCPS []  Parks Neptune, Pharm.D. []  Alycia Rossetti, Pharm.D., BCPS []  [place], Pharm.D., BCPS, AAHIVP []  Legrand Como, Pharm.D., BCPS, AAHIVP []  Milus Glazier, Pharm.D. []  Rob Evette Doffing, Pharm.D. Dimitri Ped Pharm D Positive urine culture Treated with Cephalexin, organism sensitive to the same and no further patient follow-up is required at this time.  Genia Del 02/29/2016, 10:14 AM

## 2017-01-20 ENCOUNTER — Emergency Department
Admission: EM | Admit: 2017-01-20 | Discharge: 2017-01-20 | Disposition: A | Discharge status: Home / Self Care | Payer: Non-veteran care | Attending: Emergency Medicine | Admitting: Emergency Medicine

## 2017-01-20 ENCOUNTER — Emergency Department: Admission: EM | Admit: 2017-01-20 | Discharge: 2017-01-20 | Discharge status: Absent without leave | Payer: Self-pay

## 2017-01-20 DIAGNOSIS — Z79899 Other long term (current) drug therapy: Secondary | ICD-10-CM | POA: Diagnosis not present

## 2017-01-20 DIAGNOSIS — K5900 Constipation, unspecified: Secondary | ICD-10-CM | POA: Insufficient documentation

## 2017-01-20 DIAGNOSIS — Z87891 Personal history of nicotine dependence: Secondary | ICD-10-CM | POA: Diagnosis not present

## 2017-01-20 HISTORY — DX: Constipation, unspecified: K59.00

## 2017-01-20 NOTE — ED Notes (Signed)
Patient was incontinent to brown, liquid stool. Patient was taken into the bathroom by this RN and [place] [facility] RN and after he had another semi-liquid BM, patient was cleaned up and assisted with use of walker to the stretcher. Patient was given in and out red, rubber cath kit to self cath.

## 2017-01-20 NOTE — ED Notes (Addendum)
Patient discharge and follow up information reviewed with patient by ED nursing staff and patient given the opportunity to ask questions pertaining to ED visit and discharge plan of care. Patient advised that should symptoms not continue to improve, resolve entirely, or should new symptoms develop then a follow up visit with their PCP or a return visit to the ED may be warranted. Patient verbalized consent and understanding of discharge plan of care including potential need for further evaluation. Patient being discharged in stable condition per attending ED physician on duty.    Pt requesting to sit in wheelchair in ED entrance to wait for caregiver Lajoyce Lauber to arrive from [facility]. First RN notified of this.

## 2017-01-20 NOTE — ED Triage Notes (Signed)
Per EMS, pt from [facility] with complaints of constipation for 1 week, pt w/ hx of this post back cancer in 2008. EMS reports pt was given 4 suppositories, milk of magnesia and prune juice and had a BM prior to arrival and currently one here on arrival. Pt states he did have abd pain however that has lessened since using the BR. Pt A&O and in NAD at this time.

## 2017-01-20 NOTE — ED Provider Notes (Signed)
[facility] Emergency Department Provider Note  ____________________________________________   First MD Initiated Contact with Patient 01/20/17 1913     (approximate)  I have reviewed the triage vital signs and the nursing notes.   HISTORY  Chief Complaint Constipation   HPI Timothy [name] is a 61 y.o. male with a history of chronic constipation on multiple stool softeners was presenting to the emergency department today with constipation as well as lower abdominal pain. He says that he had not moved his bowels about a week. However, prior to me examining the patient he had a bowel movement which she described as loose. He says his pain is resolved at this time. He is denying any nausea or vomiting.   Past Medical History:  Diagnosis Date  . Behavioral disorder   . Cancer ([place])   . Constipation   . Gonorrhea     Patient Active Problem List   Diagnosis Date Noted  . Constipation 02/02/2016  . UTI (lower urinary tract infection)   . Paranoid schizophrenia ([place]) 02/01/2016    Past Surgical History:  Procedure Laterality Date  . BACK SURGERY      Prior to Admission medications   Medication Sig Start Date End Date Taking? Authorizing Provider  baclofen (LIORESAL) 10 MG tablet Take 5 mg by mouth 3 (three) times daily as needed for muscle spasms.    [provider]  bisacodyl (BISACODYL) 5 MG EC tablet Take 5 mg by mouth daily as needed for moderate constipation.    [provider]  Calcium Carb-Cholecalciferol (CALCIUM-VITAMIN D) 500-400 MG-UNIT TABS Take 1 tablet by mouth daily.    [provider]  cephALEXin (KEFLEX) 500 MG capsule Take 1 capsule (500 mg total) by mouth 4 (four) times daily. 32/99/24   Delora Fuel, MD  diazepam (VALIUM) 5 MG tablet Take 2.5 mg by mouth at bedtime as needed for anxiety.    [provider]  diphenhydrAMINE (BENADRYL) 25 mg capsule Take 25 mg by mouth at bedtime as needed for  itching or sleep.    [provider]  haloperidol (HALDOL) 5 MG tablet Take 5 mg by mouth at bedtime.    [provider]  haloperidol decanoate (HALDOL DECANOATE) 50 MG/ML injection Inject 50 mg into the muscle every 28 (twenty-eight) days.    [provider]  lactulose (CHRONULAC) 10 GM/15ML solution Take 20 g by mouth 3 (three) times a week.    [provider]  omeprazole (PRILOSEC) 20 MG capsule Take 40 mg by mouth 2 (two) times daily before a meal.    [provider]  oxybutynin (DITROPAN) 5 MG tablet Take 5 mg by mouth 3 (three) times daily as needed for bladder spasms.    [provider]  polyethylene glycol (MIRALAX / GLYCOLAX) packet Take 17 g by mouth 3 (three) times a week.    [provider]  pseudoephedrine (SUDAFED) 30 MG tablet Take 30 mg by mouth every 6 (six) hours as needed for congestion.    [provider]  sulfamethoxazole-trimethoprim (BACTRIM DS) 800-160 MG tablet Take 1 tablet by mouth 2 (two) times daily. 02/02/16   Francine Graven, DO  VIAGRA 100 MG tablet Take 100 mg by mouth as needed for erectile dysfunction.  01/24/16   [provider]    Allergies Penicillins  No family history on file.  Social History Social History  Substance Use Topics  . Smoking status: Former Research scientist (life sciences)  . Smokeless tobacco: Never Used  .  Alcohol use No    Review of Systems  Constitutional: No fever/chills Eyes: No visual changes. ENT: No sore throat. Cardiovascular: Denies chest pain. Respiratory: Denies shortness of breath. Gastrointestinal: No abdominal pain.  No nausea, no vomiting.   Genitourinary: Negative for dysuria. Musculoskeletal: Negative for back pain. Skin: Negative for rash. Neurological: Negative for headaches, focal weakness or numbness.   ____________________________________________   PHYSICAL EXAM:  VITAL SIGNS: ED Triage Vitals  Enc Vitals Group     BP 01/20/17 1915 124/76       Pulse Rate 01/20/17 1915 78     Resp 01/20/17 1915 18     Temp 01/20/17 1915 98.4 F (36.9 C)     Temp Source 01/20/17 1915 Oral     SpO2 01/20/17 1915 95 %     Weight 01/20/17 1917 255 lb (115.7 kg)     Height 01/20/17 1917 6\' 2"  (1.88 m)     Head Circumference --      Peak Flow --      Pain Score 01/20/17 1914 3     Pain Loc --      Pain Edu? --      Excl. in [place]? --     Constitutional: Alert and oriented. Well appearing and in no acute distress. Eyes: Conjunctivae are normal.  Head: Atraumatic. Nose: No congestion/rhinnorhea. Mouth/Throat: Mucous membranes are moist.  Neck: No stridor.   Cardiovascular: Normal rate, regular rhythm. Grossly normal heart sounds.   Respiratory: Normal respiratory effort.  No retractions. Lungs CTAB. Gastrointestinal: Soft and nontender. No distention. Digital rectal exam without any fecal impaction detected. Grossly brown stool on the glove. Musculoskeletal: No lower extremity tenderness nor edema.  No joint effusions. Neurologic:  Normal speech and language. No gross focal neurologic deficits are appreciated. Skin:  Skin is warm, dry and intact. No rash noted. Psychiatric: Mood and affect are normal. Speech and behavior are normal.  ____________________________________________   LABS (all labs ordered are listed, but only abnormal results are displayed)  Labs Reviewed - No data to display ____________________________________________  EKG   ____________________________________________  RADIOLOGY   ____________________________________________   PROCEDURES  Procedure(s) performed:   Procedures  Critical Care performed:   ____________________________________________   INITIAL IMPRESSION / ASSESSMENT AND PLAN / ED COURSE  Pertinent labs & imaging results that were available during my care of the patient were reviewed by me and considered in my medical decision making (see chart for details).  Patient with resolved  symptoms. He is asking for his bicycle to be increased to 15 mg as needed which she says is working better for him in the past. He is now at 5 mg per dose. I will write that he may use up to 15 mg per dose as needed on his discharge paperwork. He is understanding of plan and willing to comply. Patient will be discharged home.      ____________________________________________   FINAL CLINICAL IMPRESSION(S) / ED DIAGNOSES  Constipation.    NEW MEDICATIONS STARTED DURING THIS VISIT:  New Prescriptions   No medications on file     Note:  This document was prepared using Dragon voice recognition software and may include unintentional dictation errors.     Orbie Pyo, MD 01/20/17 2028

## 2017-01-20 NOTE — ED Notes (Signed)
Report given to [facility] at the [place] who will be here in about 30 minutes to pick up the patient.

## 2017-08-13 ENCOUNTER — Encounter (HOSPITAL_COMMUNITY): Payer: Self-pay | Admitting: Emergency Medicine

## 2017-08-13 ENCOUNTER — Other Ambulatory Visit: Payer: Self-pay

## 2017-08-13 ENCOUNTER — Emergency Department (HOSPITAL_COMMUNITY)
Admission: EM | Admit: 2017-08-13 | Discharge: 2017-08-13 | Disposition: A | Discharge status: Home / Self Care | Payer: Non-veteran care | Attending: Emergency Medicine | Admitting: Emergency Medicine

## 2017-08-13 DIAGNOSIS — Z859 Personal history of malignant neoplasm, unspecified: Secondary | ICD-10-CM | POA: Diagnosis not present

## 2017-08-13 DIAGNOSIS — G479 Sleep disorder, unspecified: Secondary | ICD-10-CM | POA: Insufficient documentation

## 2017-08-13 DIAGNOSIS — F515 Nightmare disorder: Secondary | ICD-10-CM | POA: Insufficient documentation

## 2017-08-13 DIAGNOSIS — Z008 Encounter for other general examination: Secondary | ICD-10-CM | POA: Diagnosis present

## 2017-08-13 DIAGNOSIS — F17200 Nicotine dependence, unspecified, uncomplicated: Secondary | ICD-10-CM | POA: Insufficient documentation

## 2017-08-13 DIAGNOSIS — Z79899 Other long term (current) drug therapy: Secondary | ICD-10-CM | POA: Diagnosis not present

## 2017-08-13 NOTE — ED Provider Notes (Signed)
[facility] DEPT Provider Note   CSN: [id number] Arrival date & time: 08/13/17  1275     History   Chief Complaint Chief Complaint  Patient presents with  . Medical Clearance    HPI Timothy [name] is a 62 y.o. male.  HPI  Pt is a 62 yo male who presents complaining of nightmares and feels as though his hydroxyzine is not helping. No HI or SI. No hallucinations. Care is provided by the [facility]. Reports a hx of PTSD. No other complaints at this time. He is prescribed vistaril and doesn't believe it is helping  Past Medical History:  Diagnosis Date  . Behavioral disorder   . Cancer ([place])   . Constipation   . Gonorrhea     Patient Active Problem List   Diagnosis Date Noted  . Constipation 02/02/2016  . UTI (lower urinary tract infection)   . Paranoid schizophrenia ([place]) 02/01/2016    Past Surgical History:  Procedure Laterality Date  . BACK SURGERY          Home Medications    Prior to Admission medications   Medication Sig Start Date End Date Taking? Authorizing Provider  baclofen (LIORESAL) 10 MG tablet Take 5 mg by mouth 3 (three) times daily as needed for muscle spasms.    [provider]  bisacodyl (BISACODYL) 5 MG EC tablet Take 5 mg by mouth daily as needed for moderate constipation.    [provider]  Calcium Carb-Cholecalciferol (CALCIUM-VITAMIN D) 500-400 MG-UNIT TABS Take 1 tablet by mouth daily.    [provider]  cephALEXin (KEFLEX) 500 MG capsule Take 1 capsule (500 mg total) by mouth 4 (four) times daily. 17/00/17   Delora Fuel, MD  diazepam (VALIUM) 5 MG tablet Take 2.5 mg by mouth at bedtime as needed for anxiety.    [provider]  diphenhydrAMINE (BENADRYL) 25 mg capsule Take 25 mg by mouth at bedtime as needed for itching or sleep.    [provider]  haloperidol (HALDOL) 5 MG tablet Take 5 mg by mouth at bedtime.    [provider]  haloperidol decanoate  (HALDOL DECANOATE) 50 MG/ML injection Inject 50 mg into the muscle every 28 (twenty-eight) days.    [provider]  lactulose (CHRONULAC) 10 GM/15ML solution Take 20 g by mouth 3 (three) times a week.    [provider]  omeprazole (PRILOSEC) 20 MG capsule Take 40 mg by mouth 2 (two) times daily before a meal.    [provider]  oxybutynin (DITROPAN) 5 MG tablet Take 5 mg by mouth 3 (three) times daily as needed for bladder spasms.    [provider]  polyethylene glycol (MIRALAX / GLYCOLAX) packet Take 17 g by mouth 3 (three) times a week.    [provider]  pseudoephedrine (SUDAFED) 30 MG tablet Take 30 mg by mouth every 6 (six) hours as needed for congestion.    [provider]  sulfamethoxazole-trimethoprim (BACTRIM DS) 800-160 MG tablet Take 1 tablet by mouth 2 (two) times daily. 02/02/16   Francine Graven, DO  VIAGRA 100 MG tablet Take 100 mg by mouth as needed for erectile dysfunction.  01/24/16   [provider]    Family History History reviewed. No pertinent family history.  Social History Social History   Tobacco Use  . Smoking status: Current Every Day Smoker  . Smokeless tobacco: Never Used  Substance Use Topics  . Alcohol use: No  . Drug use:  No     Allergies   Penicillins   Review of Systems Review of Systems  All other systems reviewed and are negative.    Physical Exam Updated Vital Signs BP 124/84 (BP Location: Left Arm)   Pulse (!) 108   Temp 98.7 F (37.1 C) (Oral)   Resp 18   Ht 6\' 2"  (1.88 m)   Wt 117.9 kg (260 lb)   SpO2 98%   BMI 33.38 kg/m   Physical Exam  Constitutional: He is oriented to person, place, and time. He appears well-developed and well-nourished.  HENT:  Head: Normocephalic.  Eyes: EOM are normal.  Neck: Normal range of motion.  Pulmonary/Chest: Effort normal.  Abdominal: He exhibits no distension.  Musculoskeletal: Normal range of motion.  Neurological: He  is alert and oriented to person, place, and time.  Psychiatric: He has a normal mood and affect. His behavior is normal. Judgment and thought content normal.  Nursing note and vitals reviewed.    ED Treatments / Results  Labs (all labs ordered are listed, but only abnormal results are displayed) Labs Reviewed - No data to display  EKG None  Radiology No results found.  Procedures Procedures (including critical care time)  Medications Ordered in ED Medications - No data to display   Initial Impression / Assessment and Plan / ED Course  I have reviewed the triage vital signs and the nursing notes.  Pertinent labs & imaging results that were available during my care of the patient were reviewed by me and considered in my medical decision making (see chart for details).     Well appearing. Medically clear. No indication for IVC or psych placement. Outpatient follow up with his team.   Final Clinical Impressions(s) / ED Diagnoses   Final diagnoses:  Sleep disturbance    ED Discharge Orders    None       Jola Schmidt, MD 08/13/17 [phone]

## 2017-08-13 NOTE — ED Triage Notes (Signed)
Pt states he went to the [facility] to get some medication for nightmares and what they gave him made the nightmares worse  Pt states he was given visteril   Pt denies SI/HI  Pt states he wants to talk to a psychiatrist  Pt also states he has an appointment in [place] this morning at [facility]

## 2018-12-05 ENCOUNTER — Other Ambulatory Visit: Payer: Self-pay

## 2018-12-05 ENCOUNTER — Emergency Department (HOSPITAL_COMMUNITY)
Admission: EM | Admit: 2018-12-05 | Discharge: 2018-12-05 | Disposition: A | Discharge status: Home / Self Care | Payer: No Typology Code available for payment source | Attending: Emergency Medicine | Admitting: Emergency Medicine

## 2018-12-05 DIAGNOSIS — Z79899 Other long term (current) drug therapy: Secondary | ICD-10-CM | POA: Insufficient documentation

## 2018-12-05 DIAGNOSIS — Z041 Encounter for examination and observation following transport accident: Secondary | ICD-10-CM | POA: Diagnosis present

## 2018-12-05 DIAGNOSIS — F17210 Nicotine dependence, cigarettes, uncomplicated: Secondary | ICD-10-CM | POA: Diagnosis not present

## 2018-12-05 MED ORDER — CYCLOBENZAPRINE HCL 10 MG PO TABS: 10.0000 mg | ORAL_TABLET | Freq: Two times a day (BID) | ORAL | 0 refills | Status: DC | PRN
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
## 2018-12-05 NOTE — ED Notes (Signed)
Patient verbalizes understanding of discharge instructions. Opportunity for questioning and answers were provided. Armband removed by staff, pt discharged from ED.  
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
## 2018-12-05 NOTE — ED Notes (Signed)
Pt tolerated PO fluids without vomiting. Complaints of nausea.

## 2019-07-30 ENCOUNTER — Ambulatory Visit (INDEPENDENT_AMBULATORY_CARE_PROVIDER_SITE_OTHER): Payer: No Typology Code available for payment source

## 2019-07-30 ENCOUNTER — Other Ambulatory Visit: Payer: Self-pay

## 2019-07-30 ENCOUNTER — Ambulatory Visit (HOSPITAL_COMMUNITY)
Admission: EM | Admit: 2019-07-30 | Discharge: 2019-07-30 | Disposition: A | Discharge status: Home / Self Care | Payer: No Typology Code available for payment source | Attending: Family Medicine | Admitting: Family Medicine

## 2019-07-30 ENCOUNTER — Encounter (HOSPITAL_COMMUNITY): Payer: Self-pay | Admitting: Family Medicine

## 2019-07-30 DIAGNOSIS — M25571 Pain in right ankle and joints of right foot: Secondary | ICD-10-CM | POA: Diagnosis not present

## 2019-07-30 DIAGNOSIS — S96911A Strain of unspecified muscle and tendon at ankle and foot level, right foot, initial encounter: Secondary | ICD-10-CM | POA: Diagnosis not present

## 2019-07-30 IMAGING — DX DG ANKLE COMPLETE 3+V*R*
3 series · 3 of 3 positions shown · non-contrast
Comparison: None.

CLINICAL DATA: Per pt: fell yesterday evening, injured the right
ankle. Pain and swelling is the right ankle, patient stated that the
outside of the right ankle, is where the pain is. Lateral malleolus
pain and swelling. Patient arrived using a walker, had difficulty
standing to get in the wheelchair. No diabetes. Patient stated he
has little to no feeling below the knees.

EXAM:
RIGHT ANKLE - COMPLETE 3+ VIEW

[ankle ap]
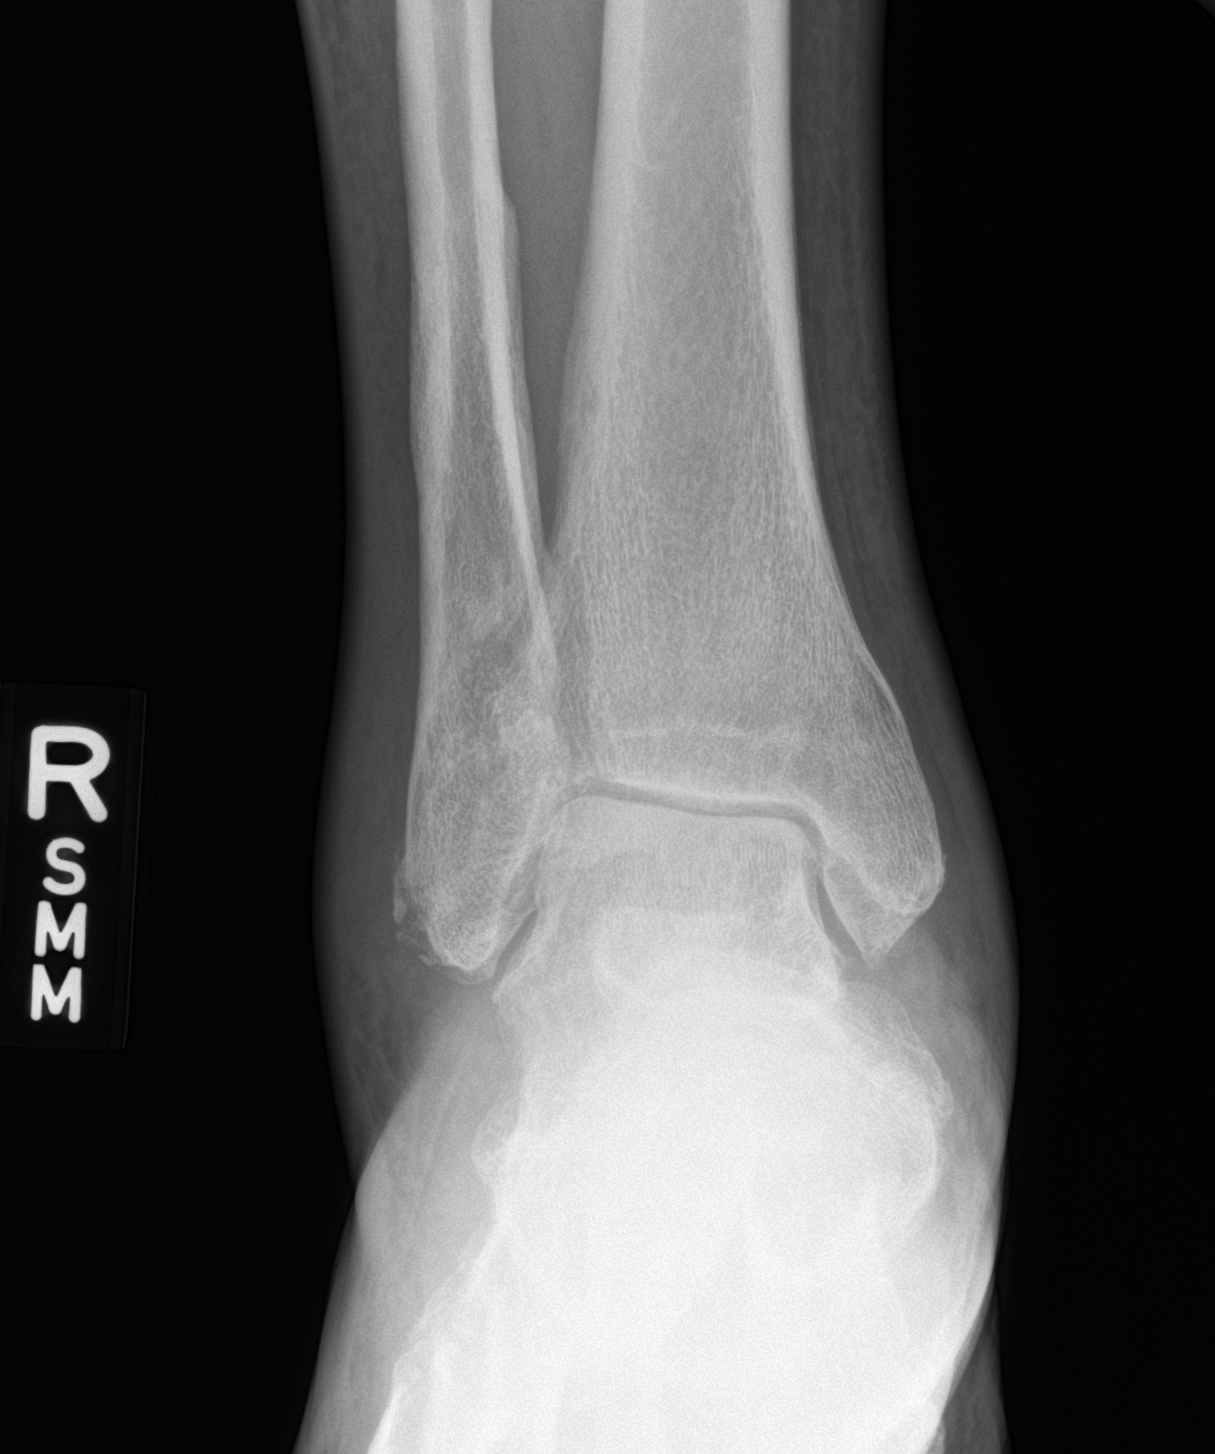

[ankle obl]
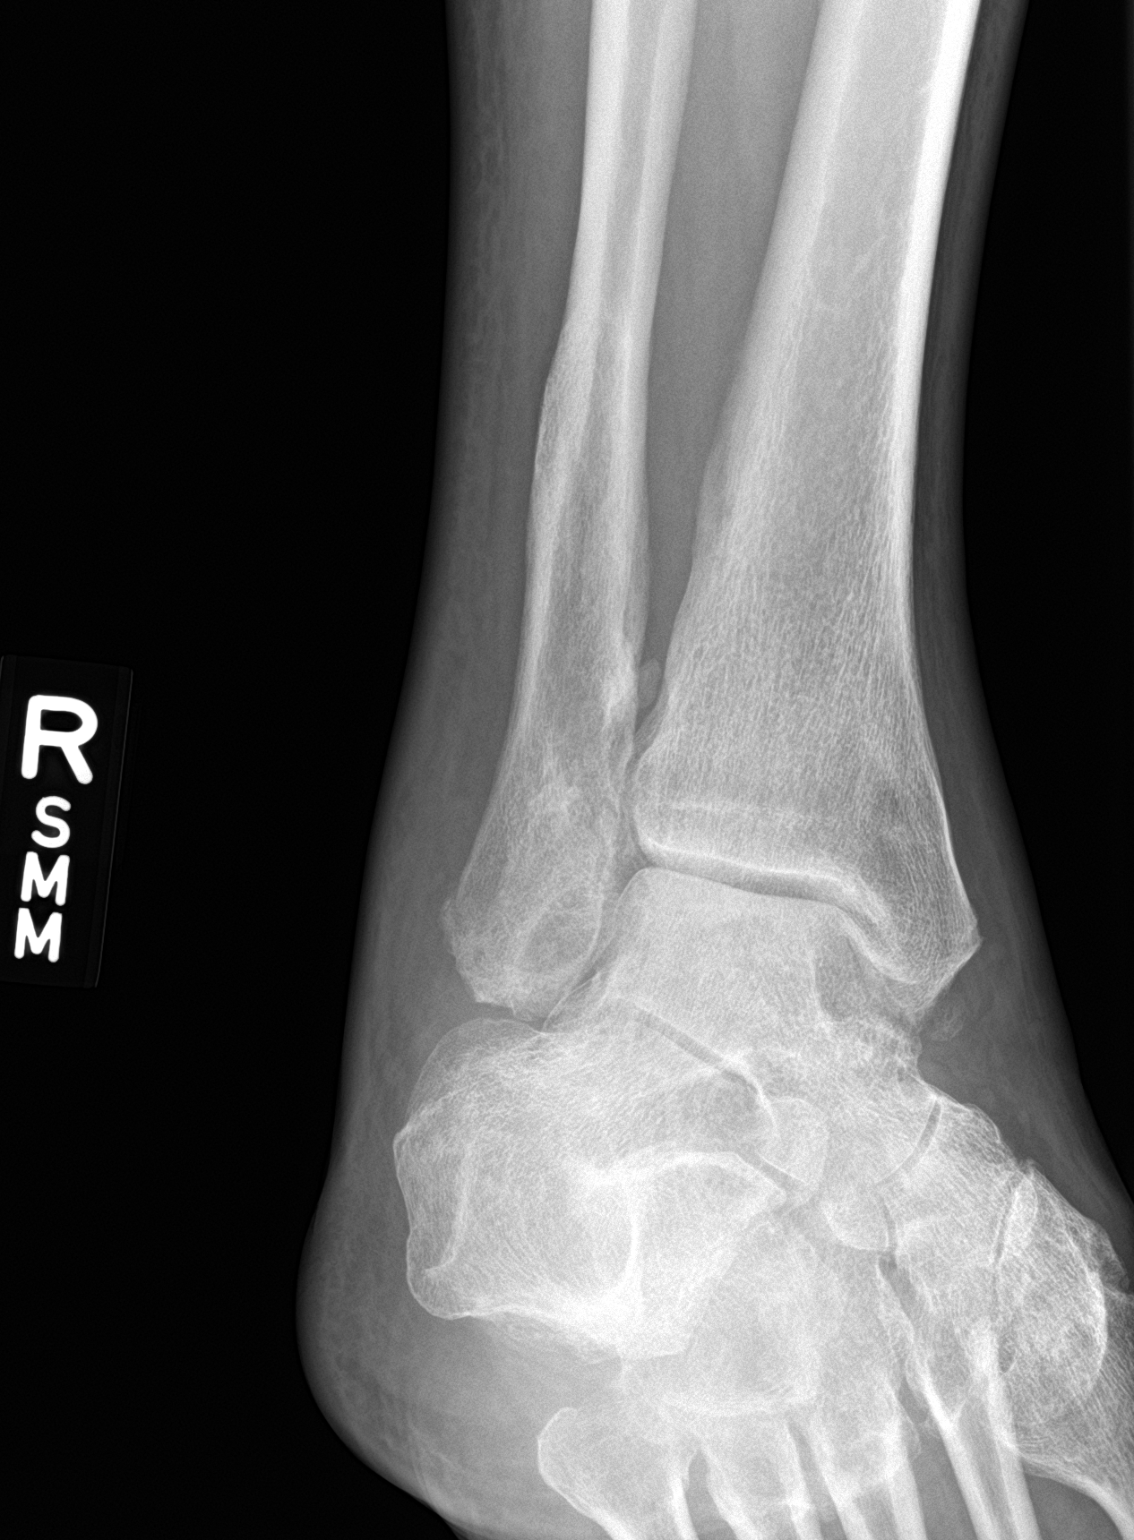

[ankle lat]
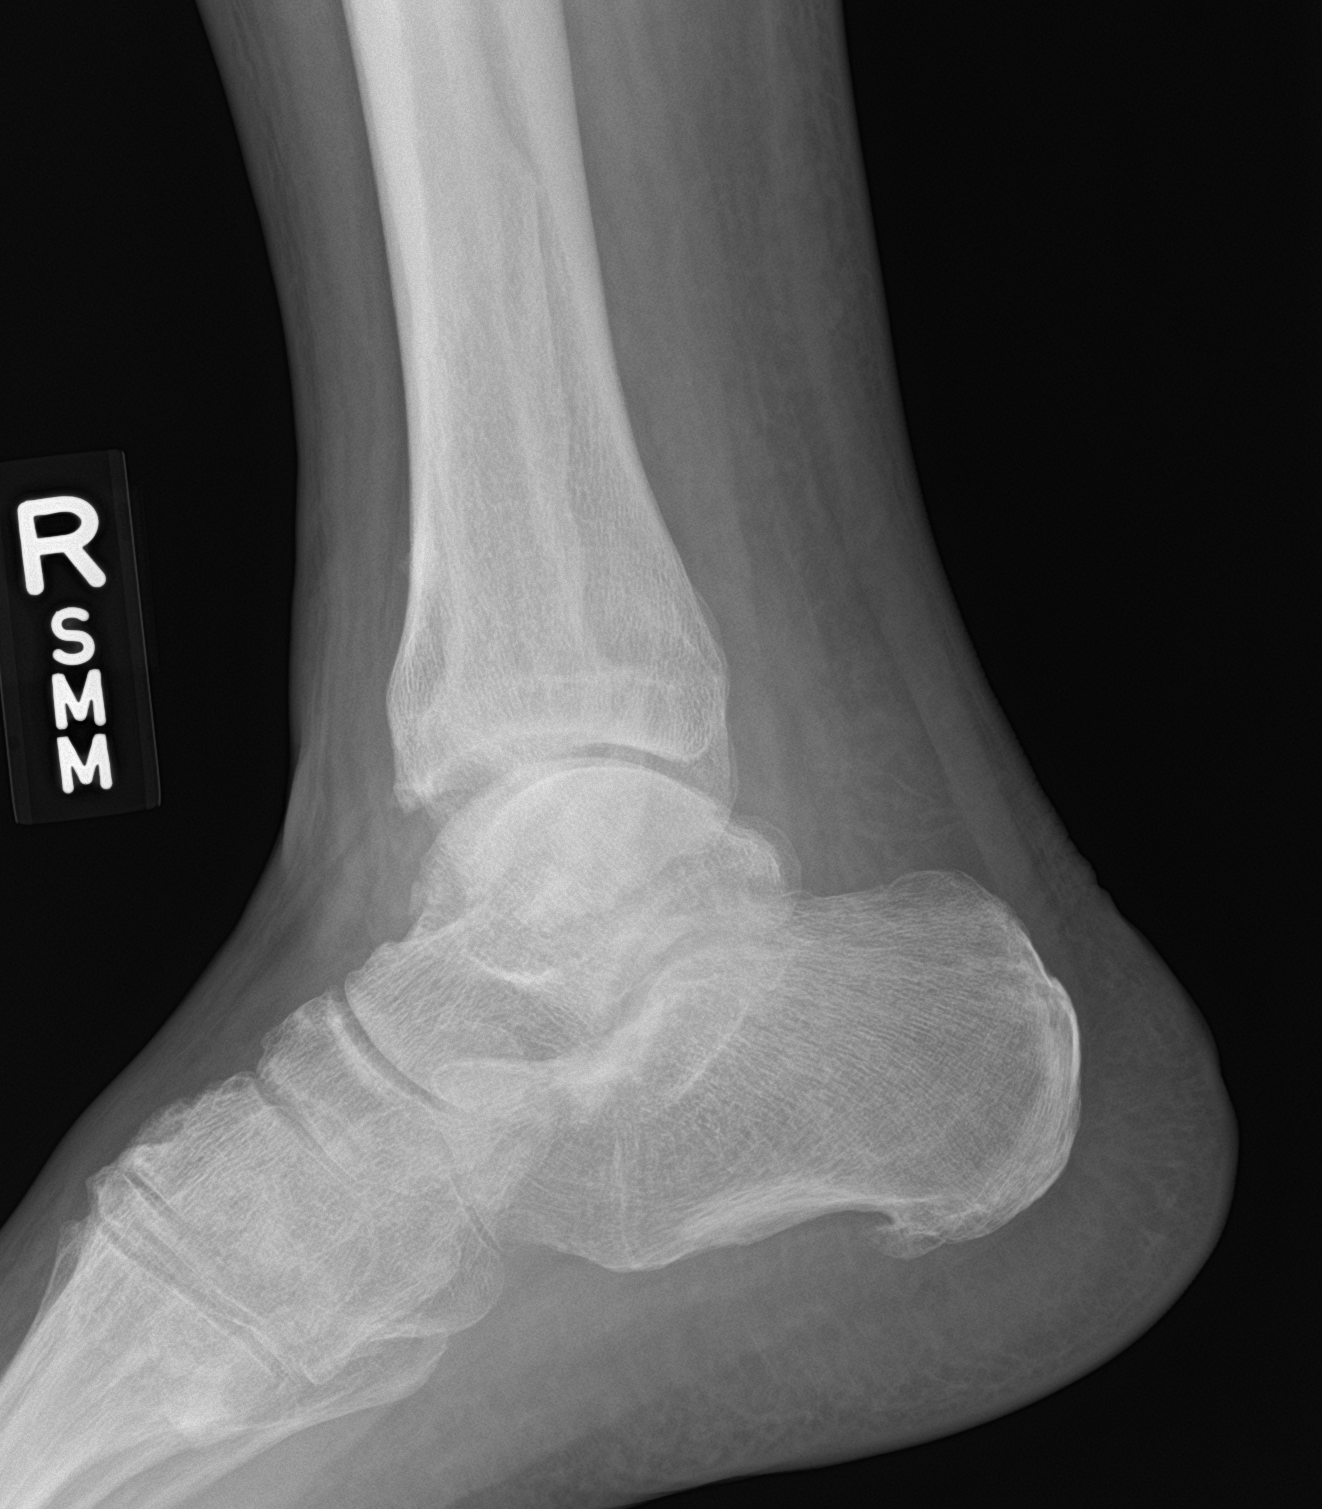

[3 of 3 positions shown; findings below may reference images not displayed]

FINDINGS: Cortical irregularity with small bone densities along the
inferolateral aspect of the distal fibula suggests small avulsion
fractures which may be recent or chronic.

No other evidence of a fracture.  Ankle joint is normally aligned.

Small plantar calcaneal spur.

Diffuse soft tissue swelling.
IMPRESSION: 1. Possible avulsion fractures from the lateral aspect of the distal
fibula, chronicity unclear. No other evidence of a fracture. No
dislocation.
2. Diffuse soft tissue swelling.

## 2019-07-30 NOTE — ED Provider Notes (Signed)
[name]    CSN: [id number] Arrival date & time: 07/30/19  1236      History   Chief Complaint Chief Complaint  Patient presents with  . Leg Pain    HPI Timothy [name] is a 64 y.o. male.   This is a 64 year old male making his initial visit to multiple urgent care.  He is complaining about an injury to his right foot.  Per patient fell x yesterday and now with right ankle pain.  Patient had a spinal cord tumor that was surgically removed in the past and has been suffering from neuropathy in both legs.  He says he has machine at massages his legs every day.  He relies on a walker to get around.  Patient suffers from schizophrenia.     Past Medical History:  Diagnosis Date  . Behavioral disorder   . Cancer ([place])   . Constipation   . Gonorrhea     Patient Active Problem List   Diagnosis Date Noted  . Constipation 02/02/2016  . UTI (lower urinary tract infection)   . Paranoid schizophrenia ([place]) 02/01/2016    Past Surgical History:  Procedure Laterality Date  . BACK SURGERY         Home Medications    Prior to Admission medications   Medication Sig Start Date End Date Taking? Authorizing Provider  baclofen (LIORESAL) 10 MG tablet Take 5 mg by mouth 3 (three) times daily as needed for muscle spasms.   Yes [provider]  bisacodyl (BISACODYL) 5 MG EC tablet Take 5 mg by mouth daily as needed for moderate constipation.   Yes [provider]  Calcium Carb-Cholecalciferol (CALCIUM-VITAMIN D) 500-400 MG-UNIT TABS Take 1 tablet by mouth daily.   Yes [provider]  diazepam (VALIUM) 5 MG tablet Take 2.5 mg by mouth at bedtime as needed for anxiety.   Yes [provider]  diphenhydrAMINE (BENADRYL) 25 mg capsule Take 25 mg by mouth at bedtime as needed for itching or sleep.   Yes [provider]  haloperidol (HALDOL) 5 MG tablet Take 5 mg by mouth at bedtime.   Yes [provider]  haloperidol  decanoate (HALDOL DECANOATE) 50 MG/ML injection Inject 50 mg into the muscle every 28 (twenty-eight) days.   Yes [provider]  lactulose (CHRONULAC) 10 GM/15ML solution Take 20 g by mouth 3 (three) times a week.   Yes [provider]  omeprazole (PRILOSEC) 20 MG capsule Take 40 mg by mouth 2 (two) times daily before a meal.   Yes [provider]  oxybutynin (DITROPAN) 5 MG tablet Take 5 mg by mouth 3 (three) times daily as needed for bladder spasms.   Yes [provider]  polyethylene glycol (MIRALAX / GLYCOLAX) packet Take 17 g by mouth 3 (three) times a week.   Yes [provider]  pseudoephedrine (SUDAFED) 30 MG tablet Take 30 mg by mouth every 6 (six) hours as needed for congestion.   Yes [provider]  VIAGRA 100 MG tablet Take 100 mg by mouth as needed for erectile dysfunction.  01/24/16  Yes [provider]  sulfamethoxazole-trimethoprim (BACTRIM DS) 800-160 MG tablet Take 1 tablet by mouth 2 (two) times daily. 02/02/16   Francine Graven, DO    Family History History reviewed. No pertinent family history.  Social History Social History   Tobacco Use  . Smoking status: Current Every Day Smoker  . Smokeless tobacco: Never Used  Substance Use Topics  .  Alcohol use: No  . Drug use: No     Allergies   Penicillins   Review of Systems Review of Systems  Musculoskeletal: Positive for arthralgias and joint swelling.  Neurological: Positive for weakness and numbness.  All other systems reviewed and are negative.    Physical Exam Triage Vital Signs ED Triage Vitals  Enc Vitals Group     BP      Pulse      Resp      Temp      Temp src      SpO2      Weight      Height      Head Circumference      Peak Flow      Pain Score      Pain Loc      Pain Edu?      Excl. in [place]?    No data found.  Updated Vital Signs BP (!) 143/83 (BP Location: Left Arm)   Pulse 80   Temp 98.4 F (36.9 C) (Oral)   Resp  16   Ht 6\' 2"  (1.88 m)   Wt 113.9 kg   SpO2 100%   BMI 32.23 kg/m    Physical Exam Vitals and nursing note reviewed.  Constitutional:      General: He is not in acute distress.    Appearance: Normal appearance. He is obese. He is not ill-appearing.  HENT:     Head: Normocephalic.     Mouth/Throat:     Mouth: Mucous membranes are moist.  Eyes:     Conjunctiva/sclera: Conjunctivae normal.  Pulmonary:     Effort: Pulmonary effort is normal.  Musculoskeletal:        General: Swelling and signs of injury present. No tenderness or deformity.     Cervical back: Normal range of motion and neck supple.     Right lower leg: Edema present.     Comments: Decreased dorsiflexion of right ankle  Neurological:     Mental Status: He is alert and oriented to person, place, and time.     Sensory: No sensory deficit.     Motor: Weakness present.     Coordination: Coordination abnormal.     Gait: Gait abnormal.  Psychiatric:     Comments: Patient speaks very slowly and I need to repeat instructions several times per in the process them.      UC Treatments / Results  Labs (all labs ordered are listed, but only abnormal results are displayed) Labs Reviewed - No data to display  EKG   Radiology Right ankle film shows irregular surface of distal fibula suggestive of old injury. Procedures Procedures (including critical care time)  Medications Ordered in UC Medications - No data to display  Initial Impression / Assessment and Plan / UC Course  I have reviewed the triage vital signs and the nursing notes.  Pertinent labs & imaging results that were available during my care of the patient were reviewed by me and considered in my medical decision making (see chart for details).    Final Clinical Impressions(s) / UC Diagnoses   Final diagnoses:  Strain of right ankle, initial encounter     Discharge Instructions     X-rays reveal no definite fracture.  Looks like you have had  some old injuries to that ankle that are causing some of the soreness.  At this point, you need to follow-up with your primary care doctor next week to check the ankle  and make sure it is healing normally.  In the meantime I want you to continue wearing the cam walker device when you are up and walking.  You can take it off when you go to bed.    ED Prescriptions    None     I have reviewed the PDMP during this encounter.   Robyn Haber, MD 07/30/19 [phone]

## 2019-07-30 NOTE — ED Triage Notes (Signed)
Per patient fell x yesterday and now with right pain.

## 2019-07-30 NOTE — Discharge Instructions (Addendum)
X-rays reveal no definite fracture.  Looks like you have had some old injuries to that ankle that are causing some of the soreness.  At this point, you need to follow-up with your primary care doctor next week to check the ankle and make sure it is healing normally.  In the meantime I want you to continue wearing the cam walker device when you are up and walking.  You can take it off when you go to bed.

## 2019-08-01 ENCOUNTER — Encounter (HOSPITAL_COMMUNITY): Payer: Self-pay

## 2019-08-01 ENCOUNTER — Ambulatory Visit (HOSPITAL_COMMUNITY)
Admission: EM | Admit: 2019-08-01 | Discharge: 2019-08-01 | Disposition: A | Discharge status: Home / Self Care | Payer: No Typology Code available for payment source | Attending: Family Medicine | Admitting: Family Medicine

## 2019-08-01 ENCOUNTER — Other Ambulatory Visit: Payer: Self-pay

## 2019-08-01 ENCOUNTER — Ambulatory Visit (INDEPENDENT_AMBULATORY_CARE_PROVIDER_SITE_OTHER): Payer: No Typology Code available for payment source

## 2019-08-01 DIAGNOSIS — M25572 Pain in left ankle and joints of left foot: Secondary | ICD-10-CM

## 2019-08-01 DIAGNOSIS — M79672 Pain in left foot: Secondary | ICD-10-CM | POA: Diagnosis not present

## 2019-08-01 IMAGING — DX DG ANKLE COMPLETE 3+V*L*
3 series · 3 of 3 positions shown · non-contrast
Comparison: None.

CLINICAL DATA: Status post fall 3 days ago.

EXAM:
LEFT ANKLE COMPLETE - 3+ VIEW

[ankle ap]
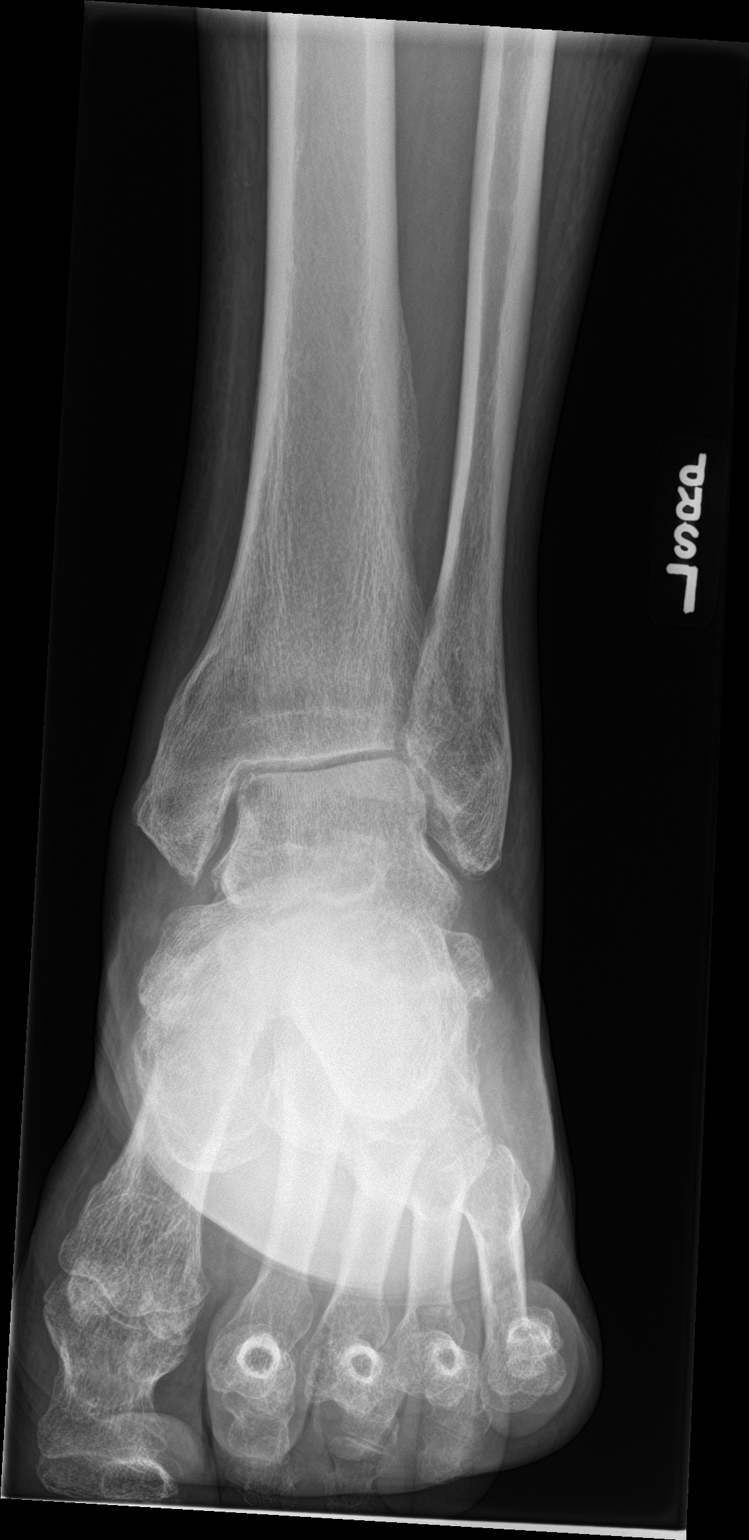

[ankle obl]
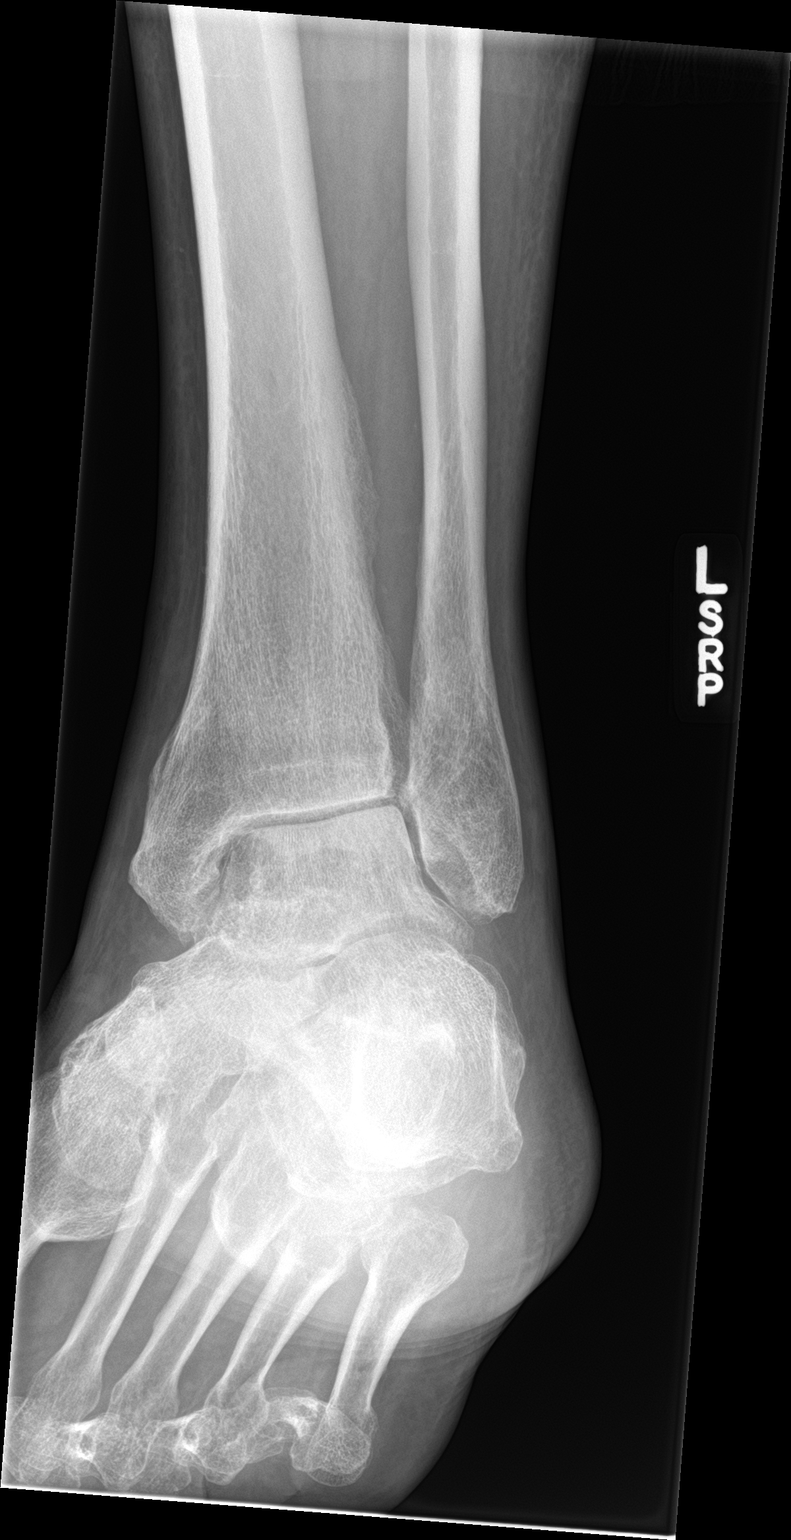

[ankle lat]
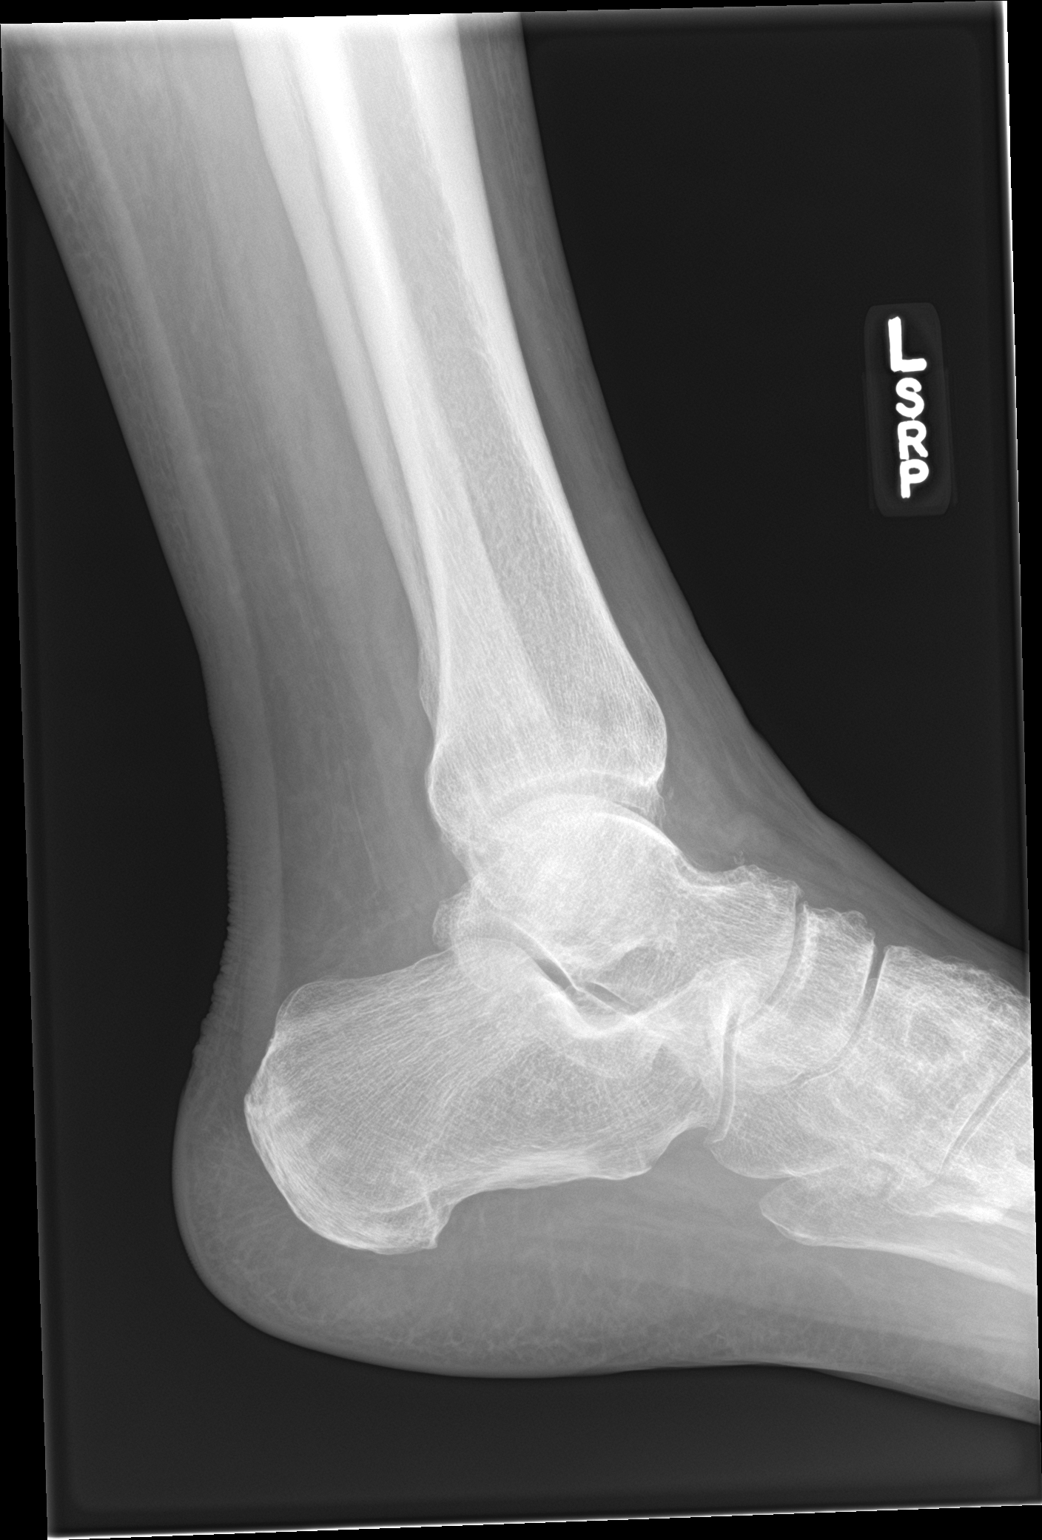

[3 of 3 positions shown; findings below may reference images not displayed]

FINDINGS: There is no evidence of fracture, dislocation, or joint effusion.
There is no evidence of arthropathy or other focal bone abnormality.
Soft tissues are unremarkable.
IMPRESSION: Negative.

## 2019-08-01 MED ORDER — NAPROXEN 375 MG PO TABS: 375.0000 mg | ORAL_TABLET | Freq: Two times a day (BID) | ORAL | 0 refills | Status: DC

## 2019-08-01 NOTE — ED Triage Notes (Signed)
Patient states that he is here for bilateral foot pain. Patient states that he was here on Saturday but that they only x-rayed his right foot and he feels like he needs to have his left foot x-rayed.

## 2019-08-01 NOTE — Discharge Instructions (Addendum)
Your xray does not demonstrate any bony findings or injury new today.  Ice, elevation of the foot to help with pain Use of ACE wrap.  Naproxen twice a day, take with food. Continue with gabapentin as prescribed.  Please follow up with your podiatrist for recheck.

## 2019-08-02 NOTE — ED Provider Notes (Signed)
[facility]    CSN: [id number] Arrival date & time: 08/01/19  1405      History   Chief Complaint Chief Complaint  Patient presents with  . Foot Pain    HPI Timothy [name] is a 64 y.o. male.   Timothy [name] presents with complaints of bilateral foot pain. He is ambulatory with a walker. He is in a CAM boot to right foot after he was evaluated on 3/13. He had a normal ankle xray on that day. He states that on 3/12 he had a fall and feels that he strained his feet. States that he has a history of paralysis and "retaught" himself to walk, therefore he has baseline neuropathy and weakness to legs and feet. He was started on gabapentin last week from his PCP, and he feels it made him weak which led to the fall. He follows with a podiatrist as well. He was recommended to xray his left foot as well due to persistent pain. He has been taking tylenol which hasn't helped with his pain. No redness swelling or warmth.    ROS per HPI, negative if not otherwise mentioned.      Past Medical History:  Diagnosis Date  . Behavioral disorder   . Cancer ([place])   . Constipation   . Gonorrhea     Patient Active Problem List   Diagnosis Date Noted  . Constipation 02/02/2016  . UTI (lower urinary tract infection)   . Paranoid schizophrenia ([place]) 02/01/2016    Past Surgical History:  Procedure Laterality Date  . BACK SURGERY         Home Medications    Prior to Admission medications   Medication Sig Start Date End Date Taking? Authorizing Provider  baclofen (LIORESAL) 10 MG tablet Take 5 mg by mouth 3 (three) times daily as needed for muscle spasms.   Yes [provider]  bisacodyl (BISACODYL) 5 MG EC tablet Take 5 mg by mouth daily as needed for moderate constipation.   Yes [provider]  Calcium Carb-Cholecalciferol (CALCIUM-VITAMIN D) 500-400 MG-UNIT TABS Take 1 tablet by mouth daily.   Yes [provider]  diazepam (VALIUM) 5 MG tablet  Take 2.5 mg by mouth at bedtime as needed for anxiety.   Yes [provider]  diphenhydrAMINE (BENADRYL) 25 mg capsule Take 25 mg by mouth at bedtime as needed for itching or sleep.   Yes [provider]  haloperidol (HALDOL) 5 MG tablet Take 5 mg by mouth at bedtime.   Yes [provider]  haloperidol decanoate (HALDOL DECANOATE) 50 MG/ML injection Inject 50 mg into the muscle every 28 (twenty-eight) days.   Yes [provider]  lactulose (CHRONULAC) 10 GM/15ML solution Take 20 g by mouth 3 (three) times a week.   Yes [provider]  omeprazole (PRILOSEC) 20 MG capsule Take 40 mg by mouth 2 (two) times daily before a meal.   Yes [provider]  oxybutynin (DITROPAN) 5 MG tablet Take 5 mg by mouth 3 (three) times daily as needed for bladder spasms.   Yes [provider]  polyethylene glycol (MIRALAX / GLYCOLAX) packet Take 17 g by mouth 3 (three) times a week.   Yes [provider]  pseudoephedrine (SUDAFED) 30 MG tablet Take 30 mg by mouth every 6 (six) hours as needed for congestion.   Yes [provider]  sulfamethoxazole-trimethoprim (BACTRIM DS) 800-160 MG tablet Take 1 tablet by mouth 2 (two) times daily. 02/02/16  Yes Francine Graven, DO  VIAGRA 100 MG tablet Take 100 mg by mouth as needed for erectile dysfunction.  01/24/16  Yes [provider]  naproxen (NAPROSYN) 375 MG tablet Take 1 tablet (375 mg total) by mouth 2 (two) times daily. 08/01/19   Zigmund Gottron, NP    Family History History reviewed. No pertinent family history.  Social History Social History   Tobacco Use  . Smoking status: Current Every Day Smoker    Packs/day: 1.00  . Smokeless tobacco: Never Used  Substance Use Topics  . Alcohol use: No  . Drug use: No     Allergies   Penicillins   Review of Systems Review of Systems   Physical Exam Triage Vital Signs ED Triage Vitals  Enc Vitals Group     BP 08/01/19  1523 136/85     Pulse Rate 08/01/19 1523 87     Resp 08/01/19 1523 18     Temp 08/01/19 1523 99.1 F (37.3 C)     Temp Source 08/01/19 1523 Oral     SpO2 08/01/19 1523 100 %     Weight 08/01/19 1521 249 lb 1.9 oz (113 kg)     Height --      Head Circumference --      Peak Flow --      Pain Score 08/01/19 1521 8     Pain Loc --      Pain Edu? --      Excl. in [place]? --    No data found.  Updated Vital Signs BP 136/85 (BP Location: Left Arm)   Pulse 87   Temp 99.1 F (37.3 C) (Oral)   Resp 18   Wt 249 lb 1.9 oz (113 kg)   SpO2 100%   BMI 31.99 kg/m   Visual Acuity Right Eye Distance:   Left Eye Distance:   Bilateral Distance:    Right Eye Near:   Left Eye Near:    Bilateral Near:     Physical Exam Constitutional:      Appearance: He is well-developed.  Cardiovascular:     Rate and Rhythm: Normal rate.  Pulmonary:     Effort: Pulmonary effort is normal.  Musculoskeletal:     Left ankle: No swelling, deformity, ecchymosis or lacerations. Tenderness present. Normal range of motion.     Comments: Soft tissue tenderness, primarily to anterior aspect of ankle although also with generalized ankle tenderness; ambulatory with walker; palpable pedal pulses  Skin:    General: Skin is warm and dry.  Neurological:     Mental Status: He is alert and oriented to person, place, and time.      UC Treatments / Results  Labs (all labs ordered are listed, but only abnormal results are displayed) Labs Reviewed - No data to display  EKG   Radiology DG Ankle Complete Left  Result Date: 08/01/2019 CLINICAL DATA:  Status post fall 3 days ago. EXAM: LEFT ANKLE COMPLETE - 3+ VIEW COMPARISON:  None. FINDINGS: There is no evidence of fracture, dislocation, or joint effusion. There is no evidence of arthropathy or other focal bone abnormality. Soft tissues are unremarkable. IMPRESSION: Negative. Electronically Signed   By: Virgina Norfolk M.D.   On: 08/01/2019 15:58     Procedures Procedures (including critical care time)  Medications Ordered in UC Medications - No data to display  Initial Impression / Assessment and Plan / UC Course  I have reviewed the triage vital signs and the nursing notes.  Pertinent labs & imaging results that were available during my care of the patient were reviewed by me and considered in my medical decision making (see chart for details).     Recent fall and new ankle pain. Normal films today. Patient asked both nursing staff and xray technician for percocet. Did not ask this provider, considering he feels gabapentin contributed to a fall already narcotic does not seem appropriate. Encouraged naproxen use, ace wrap applied. Encouraged follow up with his podiatrist. Patient verbalized understanding and agreeable to plan.   Final Clinical Impressions(s) / UC Diagnoses   Final diagnoses:  Foot pain, left     Discharge Instructions     Your xray does not demonstrate any bony findings or injury new today.  Ice, elevation of the foot to help with pain Use of ACE wrap.  Naproxen twice a day, take with food. Continue with gabapentin as prescribed.  Please follow up with your podiatrist for recheck.    ED Prescriptions    Medication Sig Dispense Auth. Provider   naproxen (NAPROSYN) 375 MG tablet Take 1 tablet (375 mg total) by mouth 2 (two) times daily. 20 tablet Augusto Gamble B, NP     I have reviewed the PDMP during this encounter.   Zigmund Gottron, NP 08/02/19 1221

## 2019-10-05 ENCOUNTER — Other Ambulatory Visit: Payer: Self-pay

## 2019-10-05 ENCOUNTER — Encounter (HOSPITAL_COMMUNITY): Payer: Self-pay

## 2019-10-05 ENCOUNTER — Ambulatory Visit (HOSPITAL_COMMUNITY)
Admission: EM | Admit: 2019-10-05 | Discharge: 2019-10-05 | Disposition: A | Discharge status: Home / Self Care | Payer: No Typology Code available for payment source | Attending: Physician Assistant | Admitting: Physician Assistant

## 2019-10-05 DIAGNOSIS — R5383 Other fatigue: Secondary | ICD-10-CM | POA: Diagnosis not present

## 2019-10-05 LAB — CBC
HCT: 46.1 % (ref 39.0–52.0)
Hemoglobin: 15.6 g/dL (ref 13.0–17.0)
MCH: 30.4 pg (ref 26.0–34.0)
MCHC: 33.8 g/dL (ref 30.0–36.0)
MCV: 89.7 fL (ref 80.0–100.0)
Platelets: 260 10*3/uL (ref 150–400)
RBC: 5.14 MIL/uL (ref 4.22–5.81)
RDW: 14.3 % (ref 11.5–15.5)
WBC: 6.8 10*3/uL (ref 4.0–10.5)
nRBC: 0 % (ref 0.0–0.2)

## 2019-10-05 LAB — TSH: TSH: 1.613 u[IU]/mL (ref 0.350–4.500)

## 2019-10-05 LAB — BASIC METABOLIC PANEL
Anion gap: 11 (ref 5–15)
BUN: 9 mg/dL (ref 8–23)
CO2: 25 mmol/L (ref 22–32)
Calcium: 9.2 mg/dL (ref 8.9–10.3)
Chloride: 105 mmol/L (ref 98–111)
Creatinine, Ser: 1.09 mg/dL (ref 0.61–1.24)
GFR calc Af Amer: >60 mL/min (ref >60)
GFR calc non Af Amer: >60 mL/min (ref >60)
Glucose, Bld: 104 mg/dL — ABNORMAL HIGH (ref 70–99)
Potassium: 3.7 mmol/L (ref 3.5–5.1)
Sodium: 141 mmol/L (ref 135–145)

## 2019-10-05 NOTE — ED Provider Notes (Signed)
[name]    CSN: [id number] Arrival date & time: 10/05/19  1723      History   Chief Complaint Chief Complaint  Patient presents with  . Fatigue    HPI Timothy [name] is a 64 y.o. male.   Patient with reported history of spinal cancer who receives most of his care through the [facility], reports for concern of 2 weeks of fatigue. He reports he would "like his blood tested." He reports for the last 2 weeks he has been tired throughout the day. He reports he is sleeping at night taking 6 mg of melatonin. He reports he was instructed by his primary provider to take 3 mg however has been taking 6. He does report some drowsiness in the morning however the fatigue persist throughout the day. He reports he feels a little weaker overall is not able to lift as much weights as he used to be. He denies that this is exertion related. Denies any chest pain, shortness of breath or feeling feel faint. He reports he had a follow-up with his neurosurgeon couple weeks ago and a talked about this and they recommended he have his blood test with his primary care. Reports he has a primary care point with the [place] on June 1. He has not contacted his primary care for this issue.  He reports also having a history of issues with his thyroid but is not currently take any medicines for this. He reports a colonoscopy a year and a half ago. He does report some recent dark-colored stool for a week however that is resolved. Denies blood in the stool. Denies any change in his urine. No frequent urination. Reports good appetite and has been eating. No change in tolerance to cold or heat.   Denies any history of diabetes or other chronic issues other than those reported above.     Past Medical History:  Diagnosis Date  . Behavioral disorder   . Cancer ([place])   . Constipation   . Gonorrhea     Patient Active Problem List   Diagnosis Date Noted  . Constipation 02/02/2016  . UTI (lower  urinary tract infection)   . Paranoid schizophrenia ([place]) 02/01/2016    Past Surgical History:  Procedure Laterality Date  . BACK SURGERY         Home Medications    Prior to Admission medications   Medication Sig Start Date End Date Taking? Authorizing Provider  haloperidol decanoate (HALDOL DECANOATE) 50 MG/ML injection Inject 50 mg into the muscle every 28 (twenty-eight) days.   Yes [provider]  sertraline (ZOLOFT) 50 MG tablet Take 50 mg by mouth daily.   Yes [provider]  baclofen (LIORESAL) 10 MG tablet Take 5 mg by mouth 3 (three) times daily as needed for muscle spasms.    [provider]  bisacodyl (BISACODYL) 5 MG EC tablet Take 5 mg by mouth daily as needed for moderate constipation.    [provider]  Calcium Carb-Cholecalciferol (CALCIUM-VITAMIN D) 500-400 MG-UNIT TABS Take 1 tablet by mouth daily.    [provider]  diazepam (VALIUM) 5 MG tablet Take 2.5 mg by mouth at bedtime as needed for anxiety.    [provider]  diphenhydrAMINE (BENADRYL) 25 mg capsule Take 25 mg by mouth at bedtime as needed for itching or sleep.    [provider]  haloperidol (HALDOL) 5 MG tablet Take 5 mg by mouth at bedtime.    [provider]  lactulose (CHRONULAC) 10 GM/15ML solution Take 20 g by mouth 3 (three) times a week.    [provider]  naproxen (NAPROSYN) 375 MG tablet Take 1 tablet (375 mg total) by mouth 2 (two) times daily. 08/01/19   Zigmund Gottron, NP  omeprazole (PRILOSEC) 20 MG capsule Take 40 mg by mouth 2 (two) times daily before a meal.    [provider]  oxybutynin (DITROPAN) 5 MG tablet Take 5 mg by mouth 3 (three) times daily as needed for bladder spasms.    [provider]  polyethylene glycol (MIRALAX / GLYCOLAX) packet Take 17 g by mouth 3 (three) times a week.    [provider]  pseudoephedrine (SUDAFED) 30 MG tablet Take 30 mg by mouth every 6  (six) hours as needed for congestion.    [provider]  sulfamethoxazole-trimethoprim (BACTRIM DS) 800-160 MG tablet Take 1 tablet by mouth 2 (two) times daily. 02/02/16   Francine Graven, DO  VIAGRA 100 MG tablet Take 100 mg by mouth as needed for erectile dysfunction.  01/24/16   [provider]    Family History History reviewed. No pertinent family history.  Social History Social History   Tobacco Use  . Smoking status: Current Every Day Smoker    Packs/day: 1.00  . Smokeless tobacco: Never Used  Substance Use Topics  . Alcohol use: No  . Drug use: No     Allergies   Penicillins   Review of Systems Review of Systems   Physical Exam Triage Vital Signs ED Triage Vitals  Enc Vitals Group     BP 10/05/19 1805 136/79     Pulse Rate 10/05/19 1805 95     Resp 10/05/19 1805 18     Temp 10/05/19 1805 98.4 F (36.9 C)     Temp Source 10/05/19 1805 Oral     SpO2 10/05/19 1805 99 %     Weight --      Height --      Head Circumference --      Peak Flow --      Pain Score 10/05/19 1801 0     Pain Loc --      Pain Edu? --      Excl. in [place]? --    No data found.  Updated Vital Signs BP 136/79 (BP Location: Right Arm)   Pulse 95   Temp 98.4 F (36.9 C) (Oral)   Resp 18   SpO2 99%   Visual Acuity Right Eye Distance:   Left Eye Distance:   Bilateral Distance:    Right Eye Near:   Left Eye Near:    Bilateral Near:     Physical Exam Vitals and nursing note reviewed.  Constitutional:      General: He is not in acute distress.    Appearance: He is well-developed. He is not ill-appearing or diaphoretic.  HENT:     Head: Normocephalic and atraumatic.  Cardiovascular:     Rate and Rhythm: Normal rate and regular rhythm.     Heart sounds: No murmur.  Pulmonary:     Effort: Pulmonary effort is normal. No respiratory distress.     Breath sounds: Normal breath sounds. No wheezing, rhonchi or rales.  Abdominal:     Palpations: Abdomen is soft.       Tenderness: There is no abdominal tenderness.  Musculoskeletal:     Cervical back: Neck supple.     Right lower leg: No edema.  Left lower leg: No edema.  Skin:    General: Skin is warm and dry.  Neurological:     Mental Status: He is alert.      UC Treatments / Results  Labs (all labs ordered are listed, but only abnormal results are displayed) Labs Reviewed  BASIC METABOLIC PANEL - Abnormal; Notable for the following components:      Result Value   Glucose, Bld 104 (*)    All other components within normal limits  CBC  TSH    EKG   Radiology No results found.  Procedures Procedures (including critical care time)  Medications Ordered in UC Medications - No data to display  Initial Impression / Assessment and Plan / UC Course  I have reviewed the triage vital signs and the nursing notes.  Pertinent labs & imaging results that were available during my care of the patient were reviewed by me and considered in my medical decision making (see chart for details).     #Fatigue Patient 64 year old male patient with primary care through the [place] system reporting for 2 weeks of fatigue. He does have some recent history of dark stools so possible GI bleed, this seems to have resolved there has been no frank blood.  No cardiac or pulmonary symptoms today.  BMP, CBC and TSH unrevealing for causes.  This may be secondary to too much melatonin or other unknown defined cause at this time.  Patient is to follow-up with his primary care with the [place].  Strict emergency department precautions were discussed.  Patient verbalized understanding he will follow up with the [facility]. Final Clinical Impressions(s) / UC Diagnoses   Final diagnoses:  Fatigue, unspecified type     Discharge Instructions     I have sent labs. I will notify you of findings requiring a conversation, otherwise they will be in your mychart.  Please call your primary care tomorrow to discuss sooner follow up and  evaluation of your fatigue  If you have shortness of breath, chest pain please go to the Emergency Department    ED Prescriptions    None     PDMP not reviewed this encounter.   Purnell Shoemaker, PA-C 10/05/19 2115

## 2019-10-05 NOTE — ED Triage Notes (Signed)
Pt c/o increase fatigue for approx 2 weeks and states "I hope I don't have COPD". But when asked, denies SOB.  States he was advised by his PMD to take 3mg  melatonin each night, but pt reports taking 6mg  every night and sleeping 8-10 hours day.  Denies abdom pain, n/v, dizziness or other complaint.

## 2019-10-05 NOTE — Discharge Instructions (Signed)
I have sent labs. I will notify you of findings requiring a conversation, otherwise they will be in your mychart.  Please call your primary care tomorrow to discuss sooner follow up and evaluation of your fatigue  If you have shortness of breath, chest pain please go to the Emergency Department

## 2019-12-13 ENCOUNTER — Other Ambulatory Visit: Payer: Self-pay

## 2019-12-13 ENCOUNTER — Emergency Department (HOSPITAL_COMMUNITY)
Admission: EM | Admit: 2019-12-13 | Discharge: 2019-12-13 | Disposition: A | Discharge status: Home / Self Care | Payer: No Typology Code available for payment source | Attending: Emergency Medicine | Admitting: Emergency Medicine

## 2019-12-13 ENCOUNTER — Encounter (HOSPITAL_COMMUNITY): Payer: Self-pay | Admitting: Emergency Medicine

## 2019-12-13 DIAGNOSIS — K625 Hemorrhage of anus and rectum: Secondary | ICD-10-CM | POA: Diagnosis present

## 2019-12-13 DIAGNOSIS — F17200 Nicotine dependence, unspecified, uncomplicated: Secondary | ICD-10-CM | POA: Insufficient documentation

## 2019-12-13 DIAGNOSIS — R195 Other fecal abnormalities: Secondary | ICD-10-CM

## 2019-12-13 DIAGNOSIS — K921 Melena: Secondary | ICD-10-CM | POA: Insufficient documentation

## 2019-12-13 LAB — COMPREHENSIVE METABOLIC PANEL
ALT: 14 U/L (ref 0–44)
AST: 15 U/L (ref 15–41)
Albumin: 4 g/dL (ref 3.5–5.0)
Alkaline Phosphatase: 81 U/L (ref 38–126)
Anion gap: 11 (ref 5–15)
BUN: 8 mg/dL (ref 8–23)
CO2: 24 mmol/L (ref 22–32)
Calcium: 8.9 mg/dL (ref 8.9–10.3)
Chloride: 104 mmol/L (ref 98–111)
Creatinine, Ser: 0.95 mg/dL (ref 0.61–1.24)
GFR calc Af Amer: >60 mL/min (ref >60)
GFR calc non Af Amer: >60 mL/min (ref >60)
Glucose, Bld: 81 mg/dL (ref 70–99)
Potassium: 3.9 mmol/L (ref 3.5–5.1)
Sodium: 139 mmol/L (ref 135–145)
Total Bilirubin: 0.7 mg/dL (ref 0.3–1.2)
Total Protein: 6.7 g/dL (ref 6.5–8.1)

## 2019-12-13 LAB — CBC
HCT: 45.6 % (ref 39.0–52.0)
Hemoglobin: 15 g/dL (ref 13.0–17.0)
MCH: 31 pg (ref 26.0–34.0)
MCHC: 32.9 g/dL (ref 30.0–36.0)
MCV: 94.2 fL (ref 80.0–100.0)
Platelets: 233 10*3/uL (ref 150–400)
RBC: 4.84 MIL/uL (ref 4.22–5.81)
RDW: 14.8 % (ref 11.5–15.5)
WBC: 6.6 10*3/uL (ref 4.0–10.5)
nRBC: 0 % (ref 0.0–0.2)

## 2019-12-13 LAB — POC OCCULT BLOOD, ED: Fecal Occult Bld: NEGATIVE

## 2019-12-13 NOTE — Discharge Instructions (Signed)
You have been evaluated for your symptoms.  Fortunately no evidence of any rectal bleed were noted on today's exam.  Your hemoglobin is normal.  Please follow-up closely with your primary care doctor for further management of your condition.

## 2019-12-13 NOTE — ED Triage Notes (Signed)
Patient is complaining of rectal bleeding for about three or four months.

## 2019-12-13 NOTE — ED Provider Notes (Signed)
[facility] DEPT Provider Note   CSN: [id number] Arrival date & time: 12/13/19  2005     History Chief Complaint  Patient presents with  . Rectal Bleeding    Timothy [name] is a 64 y.o. male.  The history is provided by the patient. No language interpreter was used.  Rectal Bleeding    64 year old male with history of paranoid schizophrenia, constipation, presenting with complaints of rectal bleeding.  Patient report for the past 3 to 4 months he has noticed intermittent episodes of black stools.  States it happened 3-4 times, last episode was may be a little over a week ago.  He attributed to having to do digital stimulation to help with his bowel movements.  He does not complain of any significant rectal pain.  He does not complain of any abdominal pain lightheadedness or dizziness.  He denies alcohol abuse or NSAID use.  He states he was told that his prostate was enlarged and he would need a biopsy by the [place].  He did have one scheduled however he missed his appointment and would like to come to the ER to have a biopsy of his prostate.  He is unsure if he had any colonoscopy in the past.  Denies Pepto-Bismol use or iron use  Past Medical History:  Diagnosis Date  . Behavioral disorder   . Cancer ([place])   . Constipation   . Gonorrhea     Patient Active Problem List   Diagnosis Date Noted  . Constipation 02/02/2016  . UTI (lower urinary tract infection)   . Paranoid schizophrenia ([name]) 02/01/2016    Past Surgical History:  Procedure Laterality Date  . BACK SURGERY         History reviewed. No pertinent family history.  Social History   Tobacco Use  . Smoking status: Current Every Day Smoker    Packs/day: 1.00  . Smokeless tobacco: Never Used  Substance Use Topics  . Alcohol use: No  . Drug use: No    Home Medications Prior to Admission medications   Medication Sig Start Date End Date Taking? Authorizing Provider  baclofen  (LIORESAL) 10 MG tablet Take 5 mg by mouth 3 (three) times daily as needed for muscle spasms.    [provider]  bisacodyl (BISACODYL) 5 MG EC tablet Take 5 mg by mouth daily as needed for moderate constipation.    [provider]  Calcium Carb-Cholecalciferol (CALCIUM-VITAMIN D) 500-400 MG-UNIT TABS Take 1 tablet by mouth daily.    [provider]  diazepam (VALIUM) 5 MG tablet Take 2.5 mg by mouth at bedtime as needed for anxiety.    [provider]  diphenhydrAMINE (BENADRYL) 25 mg capsule Take 25 mg by mouth at bedtime as needed for itching or sleep.    [provider]  haloperidol (HALDOL) 5 MG tablet Take 5 mg by mouth at bedtime.    [provider]  haloperidol decanoate (HALDOL DECANOATE) 50 MG/ML injection Inject 50 mg into the muscle every 28 (twenty-eight) days.    [provider]  lactulose (CHRONULAC) 10 GM/15ML solution Take 20 g by mouth 3 (three) times a week.    [provider]  naproxen (NAPROSYN) 375 MG tablet Take 1 tablet (375 mg total) by mouth 2 (two) times daily. 08/01/19   Zigmund Gottron, NP  omeprazole (PRILOSEC) 20 MG capsule Take 40 mg by mouth 2 (two) times daily before a meal.    [provider]  oxybutynin (  DITROPAN) 5 MG tablet Take 5 mg by mouth 3 (three) times daily as needed for bladder spasms.    [provider]  polyethylene glycol (MIRALAX / GLYCOLAX) packet Take 17 g by mouth 3 (three) times a week.    [provider]  pseudoephedrine (SUDAFED) 30 MG tablet Take 30 mg by mouth every 6 (six) hours as needed for congestion.    [provider]  sertraline (ZOLOFT) 50 MG tablet Take 50 mg by mouth daily.    [provider]  sulfamethoxazole-trimethoprim (BACTRIM DS) 800-160 MG tablet Take 1 tablet by mouth 2 (two) times daily. 02/02/16   Francine Graven, DO  VIAGRA 100 MG tablet Take 100 mg by mouth as needed for erectile dysfunction.  01/24/16    [provider]    Allergies    Penicillins  Review of Systems   Review of Systems  Gastrointestinal: Positive for hematochezia.  All other systems reviewed and are negative.   Physical Exam Updated Vital Signs BP (!) 129/75 (BP Location: Left Arm)   Pulse 89   Temp 98.5 F (36.9 C) (Oral)   Resp 19   Ht 6\' 2"  (1.88 m)   Wt (!) 114.8 kg   SpO2 95%   BMI 32.48 kg/m   Physical Exam Vitals and nursing note reviewed. Exam conducted with a chaperone present.  Constitutional:      General: He is not in acute distress.    Appearance: He is well-developed.  HENT:     Head: Atraumatic.  Eyes:     Conjunctiva/sclera: Conjunctivae normal.  Cardiovascular:     Rate and Rhythm: Normal rate and regular rhythm.     Pulses: Normal pulses.     Heart sounds: Normal heart sounds.  Pulmonary:     Effort: Pulmonary effort is normal.     Breath sounds: Normal breath sounds.  Abdominal:     Palpations: Abdomen is soft.     Tenderness: There is no abdominal tenderness.  Genitourinary:    Comments: Chaperone present during exam.  Normal rectal tone, no obvious mass, normal prostate, no thrombosed hemorrhoid, no anal fissure, normal color stool on glove Musculoskeletal:     Cervical back: Neck supple.  Skin:    Findings: No rash.  Neurological:     Mental Status: He is alert.     ED Results / Procedures / Treatments   Labs (all labs ordered are listed, but only abnormal results are displayed) Labs Reviewed  COMPREHENSIVE METABOLIC PANEL  CBC  POC OCCULT BLOOD, ED  TYPE AND SCREEN    EKG None  Radiology No results found.  Procedures Procedures (including critical care time)  Medications Ordered in ED Medications - No data to display  ED Course  I have reviewed the triage vital signs and the nursing notes.  Pertinent labs & imaging results that were available during my care of the patient were reviewed by me and considered in my medical decision making  (see chart for details).    MDM Rules/Calculators/A&P                          BP (!) 129/75 (BP Location: Left Arm)   Pulse 89   Temp 98.5 F (36.9 C) (Oral)   Resp 19   Ht 6\' 2"  (1.88 m)   Wt (!) 114.8 kg   SpO2 95%   BMI 32.48 kg/m   Final Clinical Impression(s) / ED Diagnoses Final diagnoses:  Dark  stools    Rx / DC Orders ED Discharge Orders    None     11:26 PM Patient was concern of having black stools and also request to have a biopsy of his prostate.  It appears his symptoms been ongoing for several months.  He has no associated pain with it.  He is currently resting comfortably in no acute discomfort.  Digital rectal exam unremarkable with normal color stool and fecal occult blood test is negative.  Labs are reassuring, no anemia.  I encourage patient to follow-up with his PCP for further management.   Domenic Moras, PA-C 12/13/19 2328    Molpus, Jenny Reichmann, MD 12/14/19 [phone]

## 2019-12-14 LAB — TYPE AND SCREEN
ABO/RH(D): B POS
Antibody Screen: NEGATIVE

## 2019-12-14 LAB — ABO/RH: ABO/RH(D): B POS

## 2020-06-18 ENCOUNTER — Ambulatory Visit (INDEPENDENT_AMBULATORY_CARE_PROVIDER_SITE_OTHER): Payer: No Typology Code available for payment source

## 2020-06-18 ENCOUNTER — Other Ambulatory Visit: Payer: Self-pay

## 2020-06-18 ENCOUNTER — Ambulatory Visit (HOSPITAL_COMMUNITY)
Admission: EM | Admit: 2020-06-18 | Discharge: 2020-06-18 | Disposition: A | Discharge status: Home / Self Care | Payer: No Typology Code available for payment source | Attending: Urgent Care | Admitting: Urgent Care

## 2020-06-18 ENCOUNTER — Encounter (HOSPITAL_COMMUNITY): Payer: Self-pay

## 2020-06-18 DIAGNOSIS — Z9889 Other specified postprocedural states: Secondary | ICD-10-CM

## 2020-06-18 DIAGNOSIS — C61 Malignant neoplasm of prostate: Secondary | ICD-10-CM

## 2020-06-18 DIAGNOSIS — M79662 Pain in left lower leg: Secondary | ICD-10-CM

## 2020-06-18 DIAGNOSIS — M79605 Pain in left leg: Secondary | ICD-10-CM

## 2020-06-18 DIAGNOSIS — M4316 Spondylolisthesis, lumbar region: Secondary | ICD-10-CM

## 2020-06-18 DIAGNOSIS — M62831 Muscle spasm of calf: Secondary | ICD-10-CM

## 2020-06-18 DIAGNOSIS — M5136 Other intervertebral disc degeneration, lumbar region: Secondary | ICD-10-CM

## 2020-06-18 IMAGING — DX DG LUMBAR SPINE COMPLETE 4+V
5 series · 5 of 5 positions shown · non-contrast
Comparison: None.

CLINICAL DATA: Left leg weakness and spasms. Patient reports left
leg spasm for years, worse recently.

EXAM:
LUMBAR SPINE - COMPLETE 4+ VIEW

[l-spine ap]
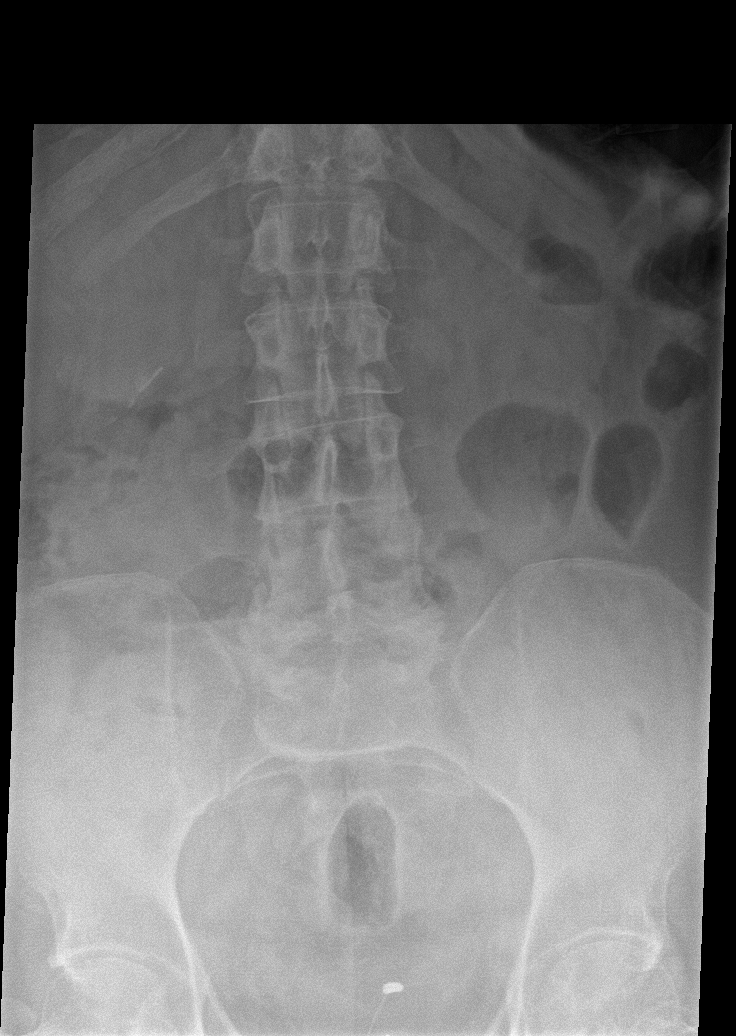

[l-spine obl (1 of 2)]
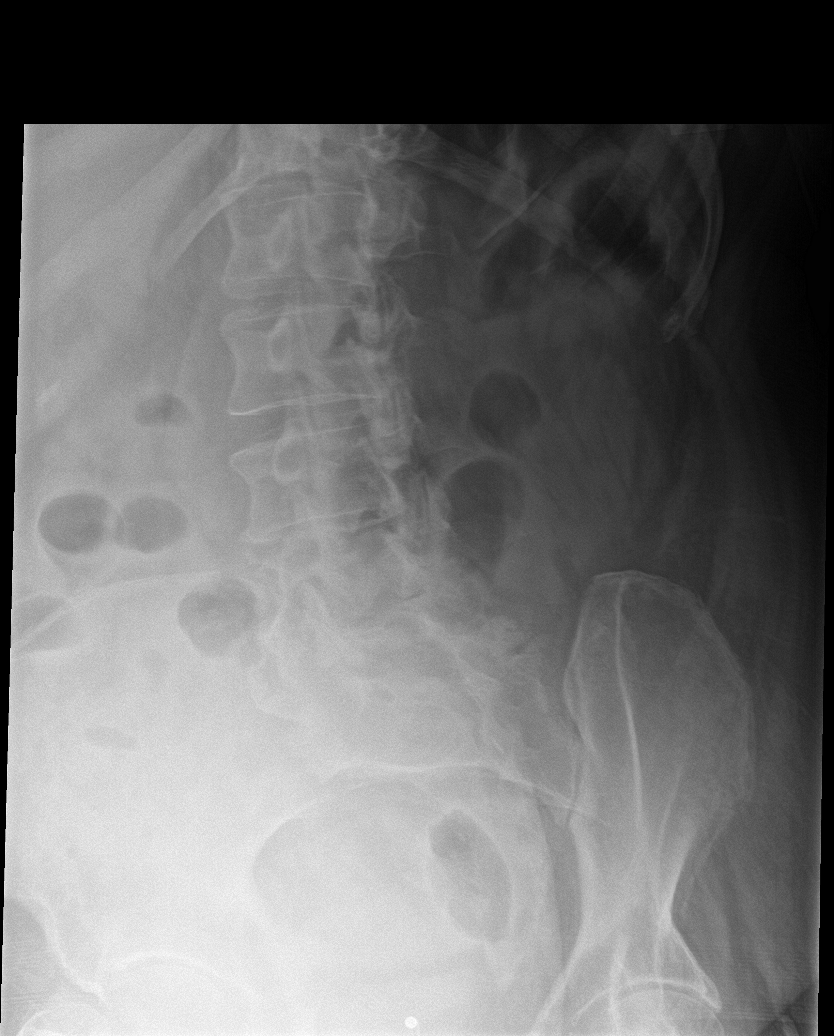

[l-spine obl (2 of 2)]
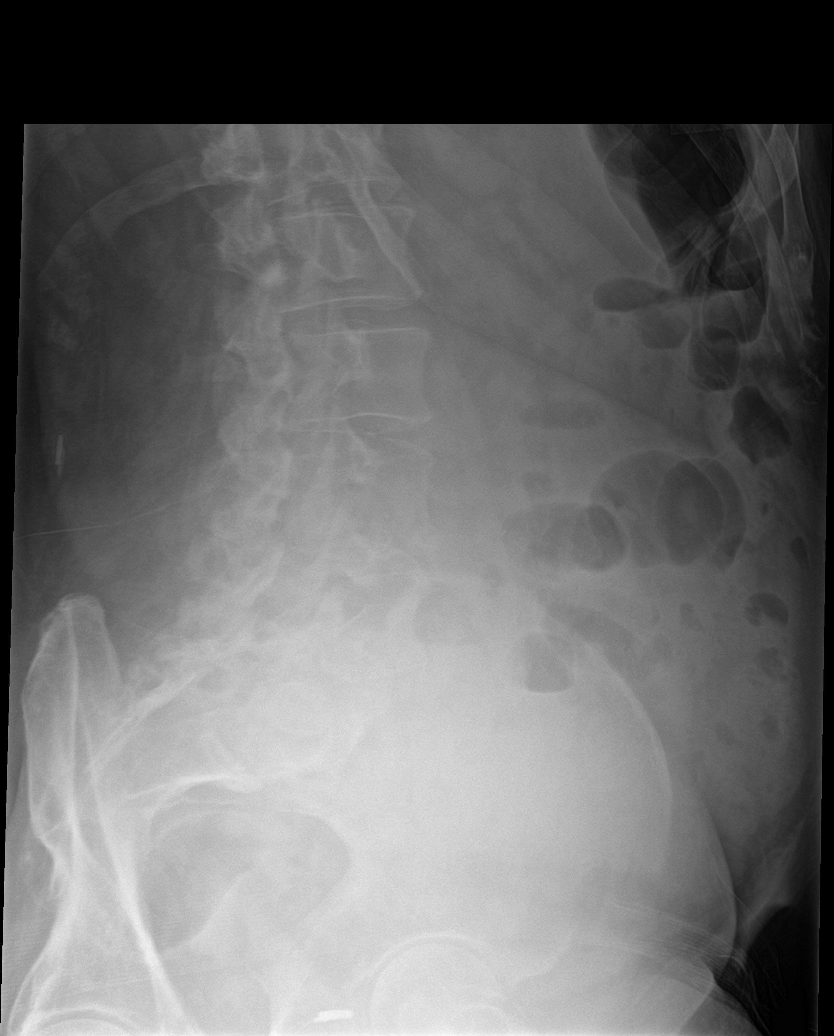

[l-spine lat]
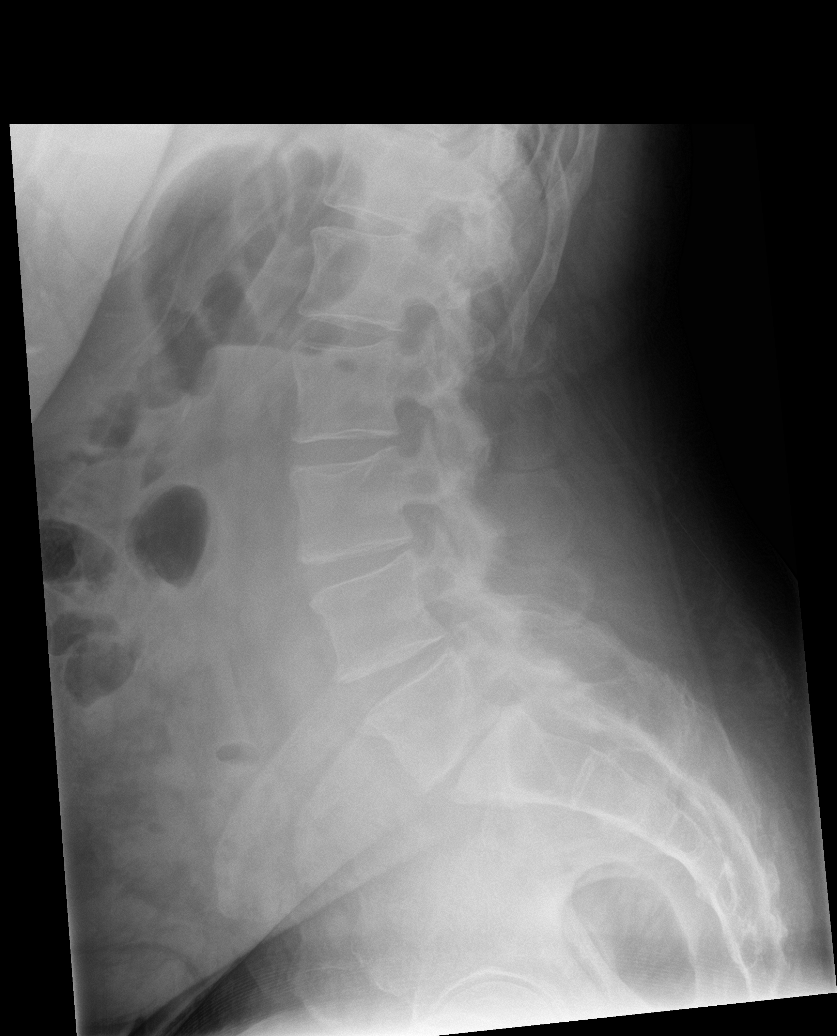

[l-spine spot]
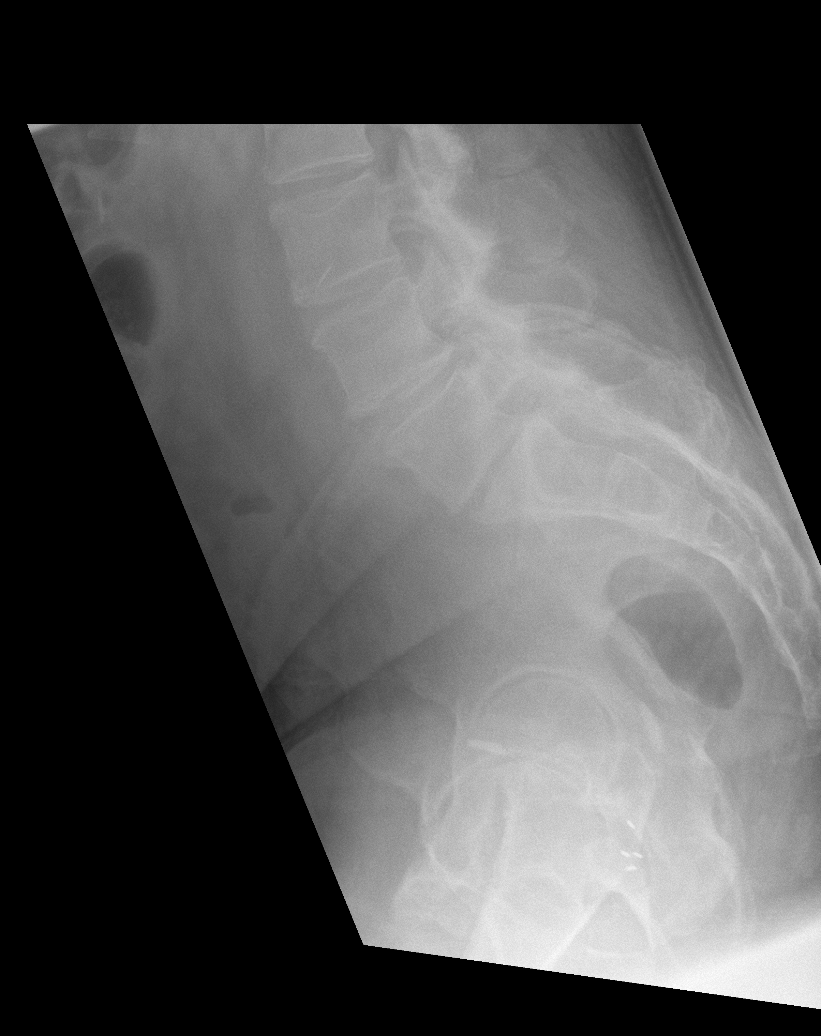

[5 of 5 positions shown; findings below may reference images not displayed]

FINDINGS: Mild broad-based dextroscoliotic curvature of the lumbar spine.
Trace anterolisthesis of L5 on S1, likely facet mediated with
prominent facet hypertrophy. No visualized pars interarticularis
defects. Vertebral body heights are normal. Mild disc space
narrowing at L3-L4, L4-L5, and L5-S1. Facet hypertrophy from L3-L4
through L5-S1. No fracture, evidence of focal lesion or bony
destruction. The sacroiliac joints are congruent.
IMPRESSION: 1. Degenerative disc disease and facet hypertrophy from L3-L4
through L5-S1.
2. Grade 1 anterolisthesis of L5 on S1 is likely facet mediated.

## 2020-06-18 MED ORDER — GABAPENTIN 300 MG PO CAPS: 300.0000 mg | ORAL_CAPSULE | Freq: Three times a day (TID) | ORAL | 1 refills | Status: DC

## 2020-06-18 MED ORDER — BACLOFEN 10 MG PO TABS: 10.0000 mg | ORAL_TABLET | Freq: Three times a day (TID) | ORAL | 1 refills | Status: DC | PRN

## 2020-06-18 NOTE — ED Provider Notes (Signed)
[place]   MRN: [id number] DOB: [birth year]  Subjective:   Timothy [name] is a 65 y.o. male presenting for several year history of persistent bilateral leg spasms now worse in the left leg and coming with associated sharp pains of the left leg. Has used baclofen, gabapentin in the past with some relief but does not have these medications now. Patient has a history of cancer of the spine, was operated in 2008.  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 He is supposed to be seen by a spinal cord injury doctor but has not been back to see them since before 2020 when COVID started.  Also recently completed radiation therapy for prostate cancer.  He is being followed by oncology now.  A procedure is planned for 2 months from now.  No current facility-administered medications for this encounter.  Current Outpatient Medications:  .  baclofen (LIORESAL) 10 MG tablet, Take 5 mg by mouth 3 (three) times daily as needed for muscle spasms., Disp: , Rfl:  .  bisacodyl (BISACODYL) 5 MG EC tablet, Take 5 mg by mouth daily as needed for moderate constipation., Disp: , Rfl:  .  Calcium Carb-Cholecalciferol (CALCIUM-VITAMIN D) 500-400 MG-UNIT TABS, Take 1 tablet by mouth daily., Disp: , Rfl:  .  diazepam (VALIUM) 5 MG tablet, Take 2.5 mg by mouth at bedtime as needed for anxiety., Disp: , Rfl:  .  diphenhydrAMINE (BENADRYL) 25 mg capsule, Take 25 mg by mouth at bedtime as needed for itching or sleep., Disp: , Rfl:  .  haloperidol (HALDOL) 5 MG tablet, Take 5 mg by mouth at bedtime., Disp: , Rfl:  .  haloperidol decanoate (HALDOL DECANOATE) 50 MG/ML injection, Inject 50 mg into the muscle every 28 (twenty-eight) days., Disp: , Rfl:  .  lactulose (CHRONULAC) 10 GM/15ML solution, Take 20 g by mouth 3 (three) times a week., Disp: , Rfl:  .  naproxen (NAPROSYN) 375 MG tablet, Take 1 tablet (375 mg total) by mouth 2 (two) times daily., Disp: 20 tablet, Rfl:  0 .  omeprazole (PRILOSEC) 20 MG capsule, Take 40 mg by mouth 2 (two) times daily before a meal., Disp: , Rfl:  .  oxybutynin (DITROPAN) 5 MG tablet, Take 5 mg by mouth 3 (three) times daily as needed for bladder spasms., Disp: , Rfl:  .  polyethylene glycol (MIRALAX / GLYCOLAX) packet, Take 17 g by mouth 3 (three) times a week., Disp: , Rfl:  .  pseudoephedrine (SUDAFED) 30 MG tablet, Take 30 mg by mouth every 6 (six) hours as needed for congestion., Disp: , Rfl:  .  sertraline (ZOLOFT) 50 MG tablet, Take 50 mg by mouth daily., Disp: , Rfl:  .  sulfamethoxazole-trimethoprim (BACTRIM DS) 800-160 MG tablet, Take 1 tablet by mouth 2 (two) times daily., Disp: 14 tablet, Rfl: 0 .  VIAGRA 100 MG tablet, Take 100 mg by mouth as needed for erectile dysfunction. , Disp: , Rfl: 1   Allergies  Allergen Reactions  . Penicillins Anaphylaxis    Has patient had a PCN reaction causing immediate rash, facial/tongue/throat swelling, SOB or lightheadedness with hypotension: Yes Has patient had a PCN reaction causing severe rash involving mucus membranes or skin necrosis: No Has patient had a PCN reaction that required hospitalization: Yes Has patient had a PCN reaction occurring within the last 10 years: No If all of the above answers are "NO", then may proceed with Cephalosporin use.  Past Medical History:  Diagnosis Date  . Behavioral disorder   . Cancer ([place])   . Constipation   . Gonorrhea      Past Surgical History:  Procedure Laterality Date  . BACK SURGERY      History reviewed. No pertinent family history.  Social History   Tobacco Use  . Smoking status: Current Every Day Smoker    Packs/day: 1.00  . Smokeless tobacco: Never Used  Substance Use Topics  . Alcohol use: No  . Drug use: No    ROS   Objective:   Vitals: BP 130/78 (BP Location: Left Arm)   Pulse 90   Temp 98.6 F (37 C)   Resp 20   SpO2 98%   Physical Exam Constitutional:      General: He is not in  acute distress.    Appearance: Normal appearance. He is well-developed and normal weight. He is not ill-appearing, toxic-appearing or diaphoretic.  HENT:     Head: Normocephalic and atraumatic.     Right Ear: External ear normal.     Left Ear: External ear normal.     Nose: Nose normal.     Mouth/Throat:     Pharynx: Oropharynx is clear.  Eyes:     General: No scleral icterus.       Right eye: No discharge.        Left eye: No discharge.     Extraocular Movements: Extraocular movements intact.     Pupils: Pupils are equal, round, and reactive to light.  Cardiovascular:     Rate and Rhythm: Normal rate.  Pulmonary:     Effort: Pulmonary effort is normal.  Musculoskeletal:     Cervical back: Normal range of motion.  Neurological:     Mental Status: He is alert and oriented to person, place, and time.  Psychiatric:        Mood and Affect: Mood normal.        Behavior: Behavior normal.        Thought Content: Thought content normal.        Judgment: Judgment normal.     DG Lumbar Spine Complete  Result Date: 06/18/2020 CLINICAL DATA:  Left leg weakness and spasms. Patient reports left leg spasm for years, worse recently. EXAM: LUMBAR SPINE - COMPLETE 4+ VIEW COMPARISON:  None. FINDINGS: Mild broad-based dextroscoliotic curvature of the lumbar spine. Trace anterolisthesis of L5 on S1, likely facet mediated with prominent facet hypertrophy. No visualized pars interarticularis defects. Vertebral body heights are normal. Mild disc space narrowing at L3-L4, L4-L5, and L5-S1. Facet hypertrophy from L3-L4 through L5-S1. No fracture, evidence of focal lesion or bony destruction. The sacroiliac joints are congruent. IMPRESSION: 1. Degenerative disc disease and facet hypertrophy from L3-L4 through L5-S1. 2. Grade 1 anterolisthesis of L5 on S1 is likely facet mediated. Electronically Signed   By: Keith Rake M.D.   On: 06/18/2020 17:53    Assessment and Plan :   I have reviewed the PDMP  during this encounter.  1. Left leg pain   2. Degenerative disc disease, lumbar   3. Anterolisthesis of lumbar spine   4. Prostate cancer ([place])   5. History of back surgery     Case discussed with Dr. Meda Coffee. Will have patient restart gabapentin, baclofen. Recommended patient follow up with the [facility] for a spine specialist and also recheck with his PCP through the [place]. Counseled patient on potential for adverse effects with medications prescribed/recommended today, ER and return-to-clinic precautions discussed,  patient verbalized understanding.    Jaynee Eagles, [place] 06/18/20 [id number]

## 2020-06-18 NOTE — ED Triage Notes (Signed)
Pt in with c/o left leg spasms that have been going on for years but seems to be worse lately  Pt has taken tylenol with no relief

## 2020-06-27 ENCOUNTER — Ambulatory Visit: Payer: Self-pay | Admitting: *Deleted

## 2020-06-27 NOTE — Telephone Encounter (Signed)
Pt reports has foley cath . States leaking earlier as he had "bag on wrong." States adjusted bag properly no longer leaking and draining. Advised on S/S UTI, bladder infection. Pt verbalizes understanding. Advised to CB if any symptoms, additional questions arise.

## 2020-07-09 ENCOUNTER — Ambulatory Visit (HOSPITAL_COMMUNITY)
Admission: EM | Admit: 2020-07-09 | Discharge: 2020-07-09 | Disposition: A | Discharge status: Home / Self Care | Payer: No Typology Code available for payment source | Attending: Student | Admitting: Student

## 2020-07-09 ENCOUNTER — Other Ambulatory Visit: Payer: Self-pay

## 2020-07-09 ENCOUNTER — Ambulatory Visit (HOSPITAL_COMMUNITY): Admission: EM | Admit: 2020-07-09 | Discharge: 2020-07-09 | Discharge status: Absent without leave | Payer: Self-pay

## 2020-07-09 ENCOUNTER — Ambulatory Visit (INDEPENDENT_AMBULATORY_CARE_PROVIDER_SITE_OTHER): Payer: No Typology Code available for payment source

## 2020-07-09 ENCOUNTER — Encounter (HOSPITAL_COMMUNITY): Payer: Self-pay | Admitting: Emergency Medicine

## 2020-07-09 DIAGNOSIS — Z131 Encounter for screening for diabetes mellitus: Secondary | ICD-10-CM

## 2020-07-09 DIAGNOSIS — W19XXXA Unspecified fall, initial encounter: Secondary | ICD-10-CM | POA: Diagnosis not present

## 2020-07-09 DIAGNOSIS — M25471 Effusion, right ankle: Secondary | ICD-10-CM

## 2020-07-09 DIAGNOSIS — S93491A Sprain of other ligament of right ankle, initial encounter: Secondary | ICD-10-CM | POA: Diagnosis not present

## 2020-07-09 DIAGNOSIS — M25571 Pain in right ankle and joints of right foot: Secondary | ICD-10-CM

## 2020-07-09 LAB — CBG MONITORING, ED: Glucose-Capillary: 102 mg/dL — ABNORMAL HIGH (ref 70–99)

## 2020-07-09 IMAGING — DX DG ANKLE COMPLETE 3+V*R*
3 series · 3 of 3 positions shown · non-contrast
Comparison: None.

CLINICAL DATA: Ankle pain and swelling post fall

EXAM:
RIGHT ANKLE - COMPLETE 3+ VIEW

[ankle ap]
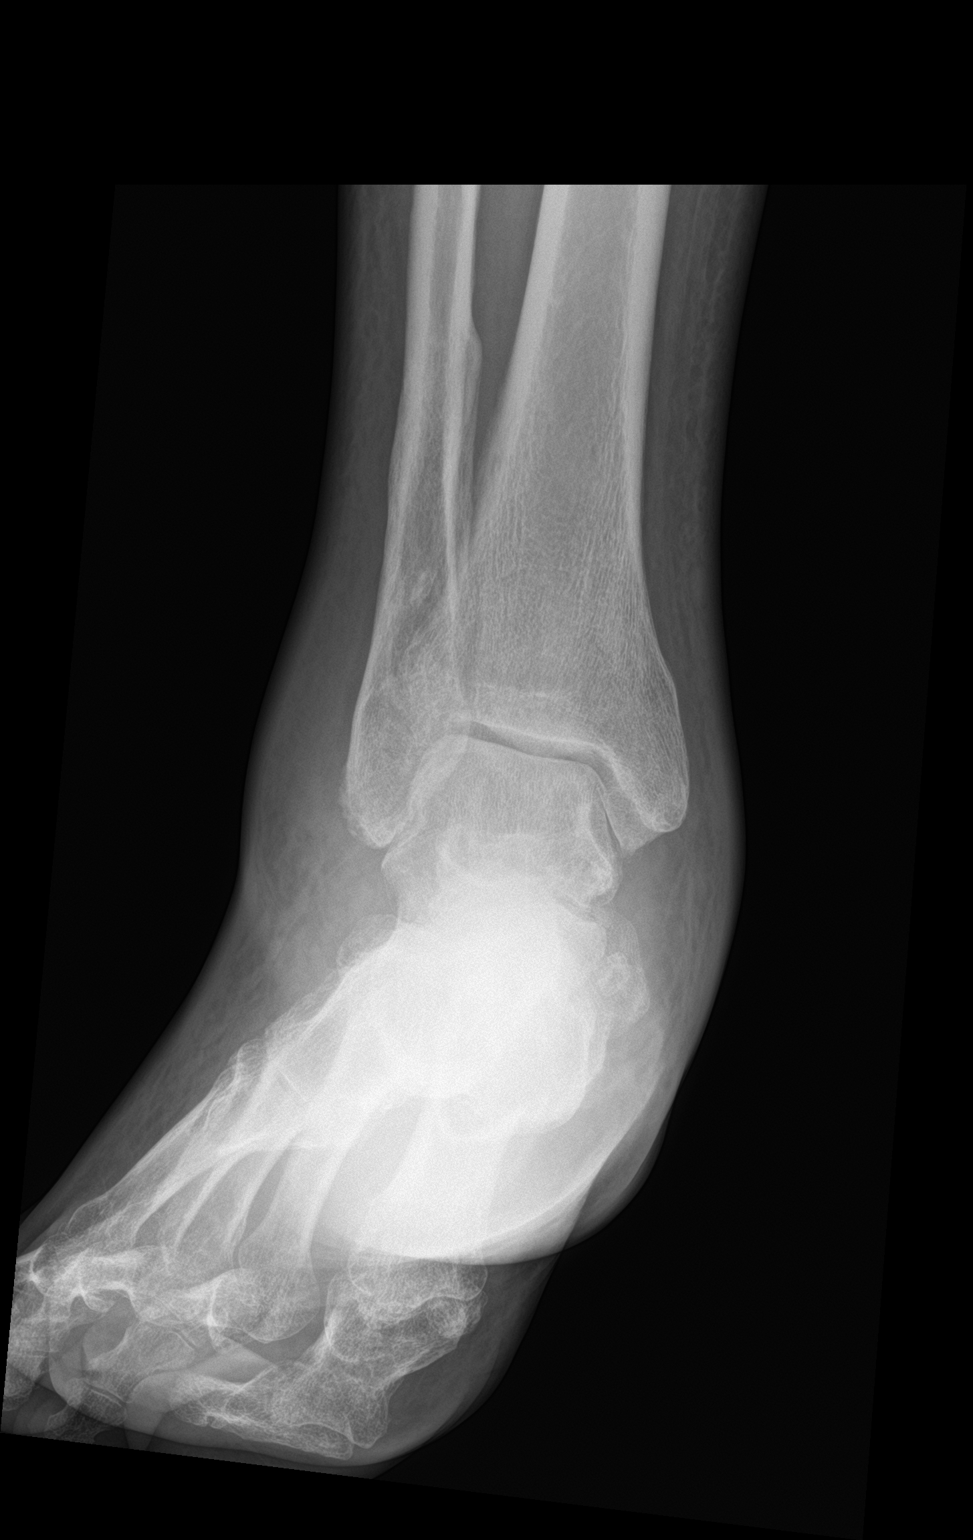

[ankle obl]
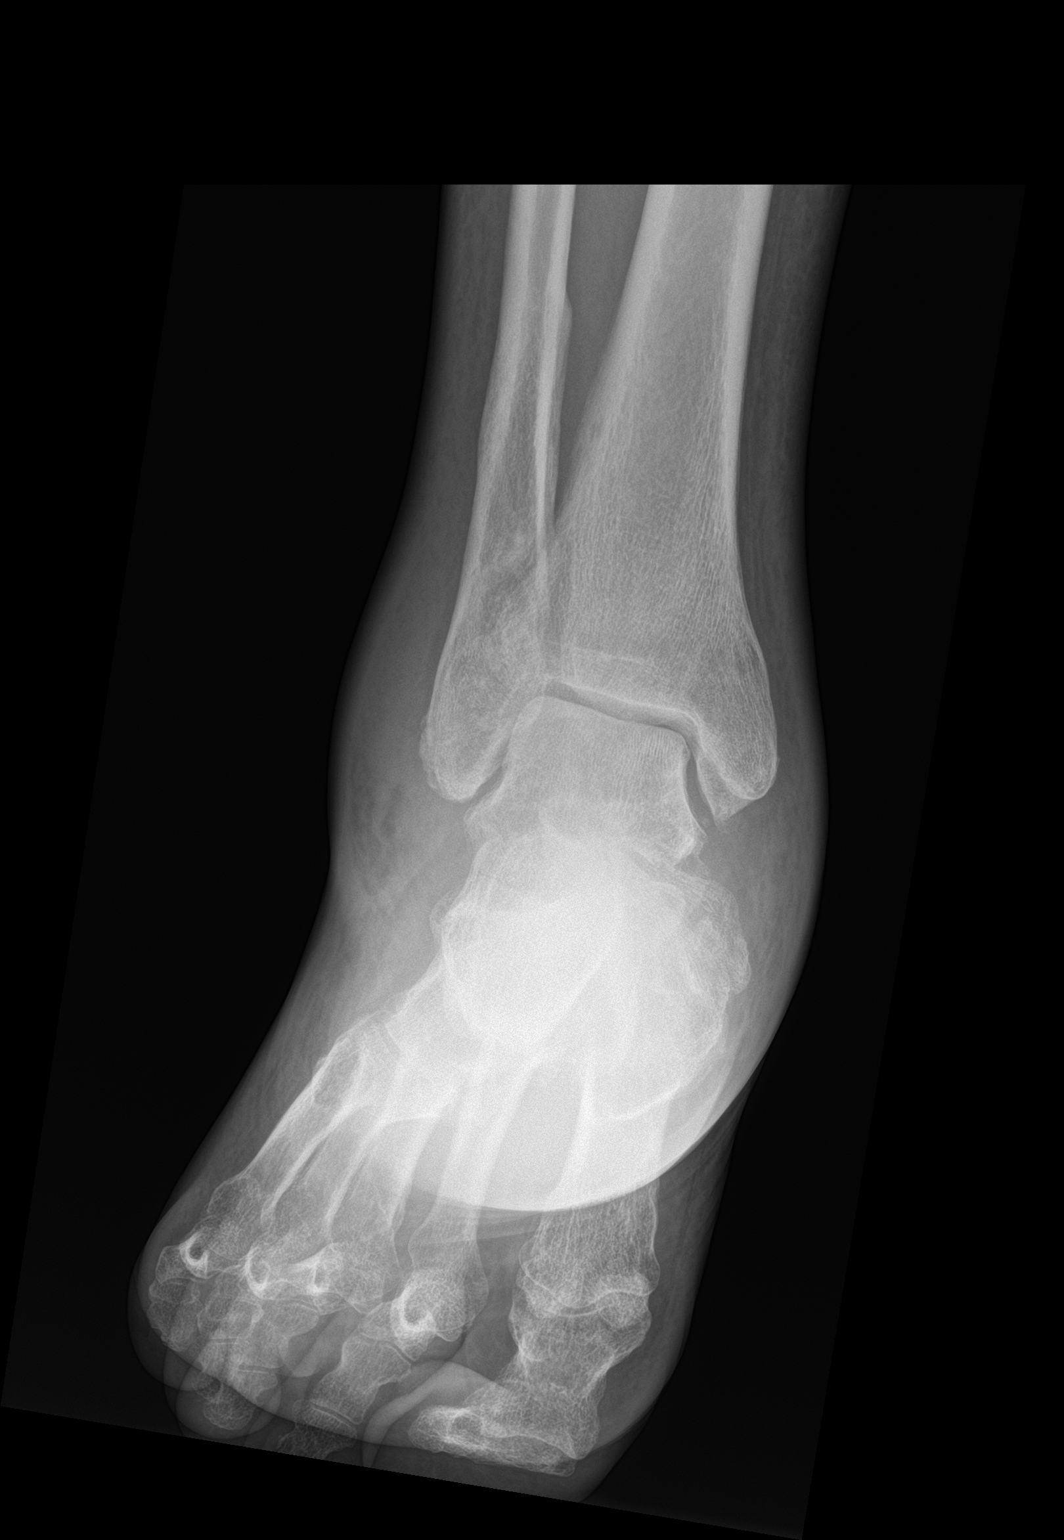

[ankle lat]
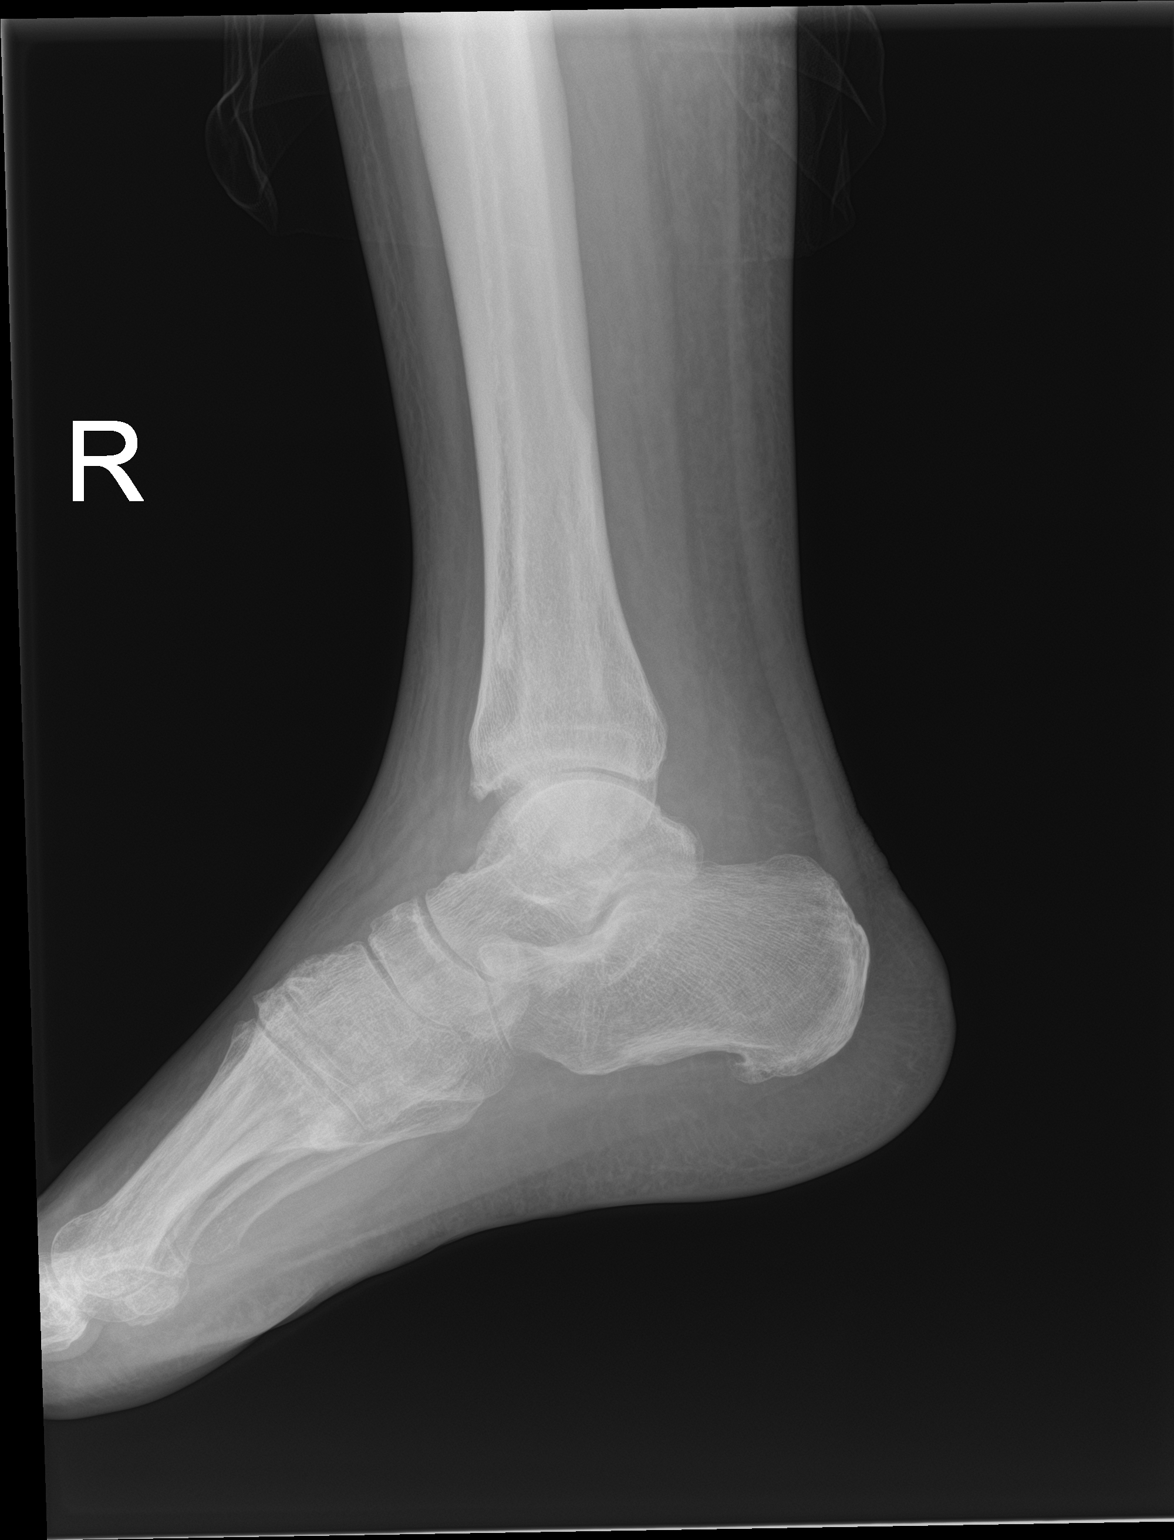

[3 of 3 positions shown; findings below may reference images not displayed]

FINDINGS: Diffuse soft tissue swelling at the ankle. Alignment is anatomic. No
acute fracture. Joint spaces are preserved.
IMPRESSION: Diffuse soft tissue swelling without acute fracture.

## 2020-07-09 NOTE — ED Triage Notes (Signed)
Pt presents today with pain and swelling to right ankle. He reported twisting foot last Saturday, 07/07/20. He is able to ambulate. No deformity noted.

## 2020-07-09 NOTE — ED Provider Notes (Signed)
[facility]    CSN: [id number] Arrival date & time: 07/09/20  1040      History   Chief Complaint Chief Complaint  Patient presents with   Foot Injury    right    HPI Timothy [name] is a 65 y.o. male presenting following twisting R ankle 3 days ago. History spinal cord cancer per pt, with residual decreased sensation in extremities; takes gabapentin and baclofen due to this. Walks with walker at baseline, and is using this to ambulate today without issue. States that he tripped 3 days ago. Since then he's noted swelling of his R ankle. Denies pain, as he cannot feel below the waist. Denies other injuries. Notes distant history fracture R ankle which did not receive treatment. Denies head trauma, headaches, dizziness, LOC, chest pain, shortness of breath.   HPI  Past Medical History:  Diagnosis Date   Behavioral disorder    Cancer [facility])    Constipation    Gonorrhea     Patient Active Problem List   Diagnosis Date Noted   Constipation 02/02/2016   UTI (lower urinary tract infection)    Paranoid schizophrenia ([place]) 02/01/2016    Past Surgical History:  Procedure Laterality Date   BACK SURGERY         Home Medications    Prior to Admission medications   Medication Sig Start Date End Date Taking? Authorizing Provider  baclofen (LIORESAL) 10 MG tablet Take 1 tablet (10 mg total) by mouth 3 (three) times daily as needed for muscle spasms. 06/18/20  Yes Jaynee Eagles, PA-C  gabapentin (NEURONTIN) 300 MG capsule Take 1 capsule (300 mg total) by mouth 3 (three) times daily. 06/18/20  Yes Jaynee Eagles, PA-C  bisacodyl (BISACODYL) 5 MG EC tablet Take 5 mg by mouth daily as needed for moderate constipation.    [provider]  Calcium Carb-Cholecalciferol (CALCIUM-VITAMIN D) 500-400 MG-UNIT TABS Take 1 tablet by mouth daily.    [provider]  diazepam (VALIUM) 5 MG tablet Take 2.5 mg by mouth at bedtime as needed for anxiety.    [provider]  diphenhydrAMINE (BENADRYL) 25 mg capsule Take 25 mg by mouth at bedtime as needed for itching or sleep.    [provider]  haloperidol (HALDOL) 5 MG tablet Take 5 mg by mouth at bedtime.    [provider]  haloperidol decanoate (HALDOL DECANOATE) 50 MG/ML injection Inject 50 mg into the muscle every 28 (twenty-eight) days.    [provider]  lactulose (CHRONULAC) 10 GM/15ML solution Take 20 g by mouth 3 (three) times a week.    [provider]  naproxen (NAPROSYN) 375 MG tablet Take 1 tablet (375 mg total) by mouth 2 (two) times daily. 08/01/19   Zigmund Gottron, NP  omeprazole (PRILOSEC) 20 MG capsule Take 40 mg by mouth 2 (two) times daily before a meal.    [provider]  oxybutynin (DITROPAN) 5 MG tablet Take 5 mg by mouth 3 (three) times daily as needed for bladder spasms.    [provider]  polyethylene glycol (MIRALAX / GLYCOLAX) packet Take 17 g by mouth 3 (three) times a week.    [provider]  pseudoephedrine (SUDAFED) 30 MG tablet Take 30 mg by mouth every 6 (six) hours as needed for congestion.    [provider]  sertraline (ZOLOFT) 50 MG tablet Take 50 mg by mouth daily.    [provider]  sulfamethoxazole-trimethoprim (BACTRIM DS) 800-160 MG  tablet Take 1 tablet by mouth 2 (two) times daily. 02/02/16   Francine Graven, DO  VIAGRA 100 MG tablet Take 100 mg by mouth as needed for erectile dysfunction.  01/24/16   [provider]    Family History History reviewed. No pertinent family history.  Social History Social History   Tobacco Use   Smoking status: Current Every Day Smoker    Packs/day: 1.00   Smokeless tobacco: Never Used  Substance Use Topics   Alcohol use: No   Drug use: No     Allergies   Penicillins   Review of Systems Review of Systems  Musculoskeletal:       Right ankle swelling   All other systems reviewed and are  negative.    Physical Exam Triage Vital Signs ED Triage Vitals  Enc Vitals Group     BP 07/09/20 1100 129/68     Pulse Rate 07/09/20 1100 (!) 101     Resp 07/09/20 1100 20     Temp 07/09/20 1100 98.9 F (37.2 C)     Temp Source 07/09/20 1100 Oral     SpO2 07/09/20 1100 100 %     Weight --      Height --      Head Circumference --      Peak Flow --      Pain Score 07/09/20 1056 5     Pain Loc --      Pain Edu? --      Excl. in [place]? --    No data found.  Updated Vital Signs BP 129/68 (BP Location: Left Arm)    Pulse (!) 101    Temp 98.9 F (37.2 C) (Oral)    Resp 20    SpO2 100%   Visual Acuity Right Eye Distance:   Left Eye Distance:   Bilateral Distance:    Right Eye Near:   Left Eye Near:    Bilateral Near:     Physical Exam Vitals reviewed.  Constitutional:      Appearance: Normal appearance.  Cardiovascular:     Rate and Rhythm: Normal rate and regular rhythm.     Heart sounds: Normal heart sounds.  Pulmonary:     Effort: Pulmonary effort is normal.     Breath sounds: Normal breath sounds.  Musculoskeletal:     Comments: R ankle with swelling over lateral mallelous. No ecchymosis or bony deformity palpated. Ankle and toe ROM intact. Cap refill <2 seconds. Decreased sensation at baseline.   No other bony deformity, echymosis, tenderness.   Walks with walker at baseline   Neurological:     General: No focal deficit present.     Mental Status: He is alert and oriented to person, place, and time.     Comments: CN 2-12 grossly intact.   Psychiatric:        Mood and Affect: Mood normal.        Behavior: Behavior normal.        Thought Content: Thought content normal.        Judgment: Judgment normal.      UC Treatments / Results  Labs (all labs ordered are listed, but only abnormal results are displayed) Labs Reviewed  CBG MONITORING, ED - Abnormal; Notable for the following components:      Result Value   Glucose-Capillary 102 (*)    All other  components within normal limits    EKG   Radiology DG Ankle Complete Right  Result Date: 07/09/2020 CLINICAL  DATA:  Ankle pain and swelling post fall EXAM: RIGHT ANKLE - COMPLETE 3+ VIEW COMPARISON:  None. FINDINGS: Diffuse soft tissue swelling at the ankle. Alignment is anatomic. No acute fracture. Joint spaces are preserved. IMPRESSION: Diffuse soft tissue swelling without acute fracture. Electronically Signed   By: Macy Mis M.D.   On: 07/09/2020 12:07    Procedures Procedures (including critical care time)  Medications Ordered in UC Medications - No data to display  Initial Impression / Assessment and Plan / UC Course  I have reviewed the triage vital signs and the nursing notes.  Pertinent labs & imaging results that were available during my care of the patient were reviewed by me and considered in my medical decision making (see chart for details).      This patient is a 65 year old male presenting with L ankle injury. History spinal cord surgery and walks with walker at baseline.   Xray L ankle Diffuse soft tissue swelling without acute fracture. CAM boot applied. Strict return precautions as below.   Patient requests CBD today. He denies history of diabetes. Nonfasting CBG 102.   Spent over 40 minutes obtaining H&P, performing physical, interpreting films, discussing results, treatment plan and plan for follow-up with patient. Patient agrees with plan.    Final Clinical Impressions(s) / UC Diagnoses   Final diagnoses:  Sprain of anterior talofibular ligament of right ankle, initial encounter  Diabetes mellitus screening     Discharge Instructions     -Keep your boot on during the day/ while walking until the swelling subsides.  -If your foot is getting worse instead of better, or if symptoms persist >7 days, follow-up with orthopedist. Information below.     ED Prescriptions    None     PDMP not reviewed this encounter.   Hazel Sams,  PA-C 07/09/20 1232

## 2020-07-09 NOTE — Discharge Instructions (Signed)
-  Keep your boot on during the day/ while walking until the swelling subsides.  -If your foot is getting worse instead of better, or if symptoms persist >7 days, follow-up with orthopedist. Information below.

## 2020-07-19 ENCOUNTER — Other Ambulatory Visit: Payer: Self-pay

## 2020-07-19 ENCOUNTER — Ambulatory Visit (HOSPITAL_COMMUNITY)
Admission: RE | Admit: 2020-07-19 | Discharge: 2020-07-19 | Disposition: A | Discharge status: Home / Self Care | Payer: No Typology Code available for payment source | Source: Ambulatory Visit | Attending: Student | Admitting: Student

## 2020-07-19 ENCOUNTER — Encounter (HOSPITAL_COMMUNITY): Payer: Self-pay

## 2020-07-19 VITALS — BP 127/82 | HR 81 | Temp 98.9°F | Resp 19

## 2020-07-19 DIAGNOSIS — R159 Full incontinence of feces: Secondary | ICD-10-CM | POA: Diagnosis not present

## 2020-07-19 DIAGNOSIS — K6289 Other specified diseases of anus and rectum: Secondary | ICD-10-CM

## 2020-07-19 DIAGNOSIS — H538 Other visual disturbances: Secondary | ICD-10-CM | POA: Diagnosis not present

## 2020-07-19 DIAGNOSIS — Z9989 Dependence on other enabling machines and devices: Secondary | ICD-10-CM

## 2020-07-19 DIAGNOSIS — J3089 Other allergic rhinitis: Secondary | ICD-10-CM | POA: Diagnosis not present

## 2020-07-19 DIAGNOSIS — S93491D Sprain of other ligament of right ankle, subsequent encounter: Secondary | ICD-10-CM

## 2020-07-19 LAB — POC OCCULT BLOOD, ED: Fecal Occult Bld: NEGATIVE

## 2020-07-19 MED ORDER — CETIRIZINE HCL 10 MG PO TABS: 10.0000 mg | ORAL_TABLET | Freq: Every day | ORAL | 2 refills | Status: DC

## 2020-07-19 NOTE — Discharge Instructions (Signed)
-  Please follow-up with your primary care provider for your multiple chronic conditions.  Your primary care is better suited to evaluate your chronic conditions and send you to the appropriate specialist for these.  If your primary care is the [place], they should be able to do this for you. -You need an eye exam by an ophthalmologist.  It sounds like you already have one scheduled.  Please follow-up with them.  We cannot provide a full eye exam here.  If you have any new symptoms like vision changes, dizziness, worst headache of life, vision loss, head straight to the ER for urgent evaluation. -For your rectal pain, also follow-up with your primary care provider. You may need another colonoscopy, which they can order for you. -For your foot pain, please follow-up with an orthopedist as advised at your last visit.  I put information for this below again.  Please call them to schedule appointment.  You can also follow-up with your primary care provider for this. -Your sore throat is caused by allergies.  I sent a prescription for Zyrtec.  Please start this.

## 2020-07-19 NOTE — ED Notes (Signed)
This RN chaperoned provider while she performed rectal exam.

## 2020-07-19 NOTE — ED Provider Notes (Addendum)
Timothy [name]    CSN: [id number] Arrival date & time: 07/19/20  1125      History   Chief Complaint Chief Complaint  Patient presents with  . Appointment  . Blurred Vision  . Spasms  . Sore Throat  . Rectal Pain    HPI DONATELLO KLEVE is a 65 y.o. male presenting with multiple complaints: Blurred vision, muscle spasms, sore throat, rectal pain, incontinence, foot pain.  He states that the [place] told him to come here for evaluation of his blurred vision and rectal pain., despite being his primary care.  This patient has medical history including paranoid schizophrenia, constipation, UTI, spinal cancer with subsequent surgery and numbness below the bellybutton, walks with walker due to this. -Patient states that he has suffered from bowel and bladder incontinence for over 30 years, related to spinal surgery.  He states that he has to perform daily digital stimulation, and he experiences pain during this.  Denies any rectal pain other than when he is doing a disimpaction.  Denies BRBPR, dark tarry stool, abdominal pain, change in bowel or bladder function.  He is incontinent at baseline and wears diapers. He states he had a colonoscopy 2-3 years ago which was normal. -He also states that he has had blurred vision when he lays on his back for over 30 years.  Some double vision centrally as well.  He denies absolutely any changes in this.  Wears reading glasses but no contacts.  He also states that his eyelids seem to spasm sometimes, and he wants this fully worked up as well.  Denies vision changes, vision loss, blurred vision, headaches, dizziness, pain with eye movement, redness, burning, discharge in the morning. -He also states that his right ankle continues to hurt.  I fully evaluated this last time he was here, and diagnosed him with a sprain of the anterior talofibular ligament.  I advised him to follow-up with orthopedist, which he has not done.  He complains of continued pain and  swelling.  Using his boot as directed and walking with walker. Denies new trauma. -He states he has had a sore throat for the last 3 weeks.  Endorses seasonal allergies which are currently exacerbated and he is taking no medications for this. Denies fevers/chills, n/v/d, shortness of breath, chest pain, cough, congestion, facial pain, teeth pain, headaches, loss of taste/smell, swollen lymph nodes, ear pain. Declines covid test.  HPI  Past Medical History:  Diagnosis Date  . Behavioral disorder   . Cancer ([place])   . Constipation   . Gonorrhea     Patient Active Problem List   Diagnosis Date Noted  . Constipation 02/02/2016  . UTI (lower urinary tract infection)   . Paranoid schizophrenia ([place]) 02/01/2016    Past Surgical History:  Procedure Laterality Date  . BACK SURGERY         Home Medications    Prior to Admission medications   Medication Sig Start Date End Date Taking? Authorizing Provider  cetirizine (ZYRTEC ALLERGY) 10 MG tablet Take 1 tablet (10 mg total) by mouth daily. 07/19/20  Yes Hazel Sams, PA-C  baclofen (LIORESAL) 10 MG tablet Take 1 tablet (10 mg total) by mouth 3 (three) times daily as needed for muscle spasms. 06/18/20   Jaynee Eagles, PA-C  bisacodyl (BISACODYL) 5 MG EC tablet Take 5 mg by mouth daily as needed for moderate constipation.    [provider]  Calcium Carb-Cholecalciferol (CALCIUM-VITAMIN D) 500-400 MG-UNIT TABS Take 1 tablet  by mouth daily.    [provider]  diazepam (VALIUM) 5 MG tablet Take 2.5 mg by mouth at bedtime as needed for anxiety.    [provider]  diphenhydrAMINE (BENADRYL) 25 mg capsule Take 25 mg by mouth at bedtime as needed for itching or sleep.    [provider]  gabapentin (NEURONTIN) 300 MG capsule Take 1 capsule (300 mg total) by mouth 3 (three) times daily. 06/18/20   Jaynee Eagles, PA-C  haloperidol (HALDOL) 5 MG tablet Take 5 mg by mouth at bedtime.    [provider]   haloperidol decanoate (HALDOL DECANOATE) 50 MG/ML injection Inject 50 mg into the muscle every 28 (twenty-eight) days.    [provider]  lactulose (CHRONULAC) 10 GM/15ML solution Take 20 g by mouth 3 (three) times a week.    [provider]  naproxen (NAPROSYN) 375 MG tablet Take 1 tablet (375 mg total) by mouth 2 (two) times daily. 08/01/19   Zigmund Gottron, NP  omeprazole (PRILOSEC) 20 MG capsule Take 40 mg by mouth 2 (two) times daily before a meal.    [provider]  oxybutynin (DITROPAN) 5 MG tablet Take 5 mg by mouth 3 (three) times daily as needed for bladder spasms.    [provider]  polyethylene glycol (MIRALAX / GLYCOLAX) packet Take 17 g by mouth 3 (three) times a week.    [provider]  pseudoephedrine (SUDAFED) 30 MG tablet Take 30 mg by mouth every 6 (six) hours as needed for congestion.    [provider]  sertraline (ZOLOFT) 50 MG tablet Take 50 mg by mouth daily.    [provider]  sulfamethoxazole-trimethoprim (BACTRIM DS) 800-160 MG tablet Take 1 tablet by mouth 2 (two) times daily. 02/02/16   Francine Graven, DO  VIAGRA 100 MG tablet Take 100 mg by mouth as needed for erectile dysfunction.  01/24/16   [provider]    Family History History reviewed. No pertinent family history.  Social History Social History   Tobacco Use  . Smoking status: Current Every Day Smoker    Packs/day: 1.00  . Smokeless tobacco: Never Used  Substance Use Topics  . Alcohol use: No  . Drug use: No     Allergies   Penicillins   Review of Systems Review of Systems  Constitutional: Negative for appetite change, chills and fever.  HENT: Positive for sore throat. Negative for congestion, ear pain, rhinorrhea, sinus pressure and sinus pain.   Eyes: Positive for visual disturbance. Negative for photophobia, pain, discharge, redness and itching.  Respiratory: Negative for cough, chest tightness, shortness of  breath and wheezing.   Cardiovascular: Negative for chest pain and palpitations.  Gastrointestinal: Positive for rectal pain. Negative for abdominal distention, abdominal pain, anal bleeding, blood in stool, constipation, diarrhea, nausea and vomiting.  Genitourinary: Negative for dysuria, frequency and urgency.  Musculoskeletal: Negative for myalgias.       Right ankle pain   Neurological: Negative for dizziness, weakness and headaches.  Psychiatric/Behavioral: Negative for confusion.  All other systems reviewed and are negative.    Physical Exam Triage Vital Signs ED Triage Vitals  Enc Vitals Group     BP 07/19/20 1212 127/82     Pulse Rate 07/19/20 1212 81     Resp 07/19/20 1212 19     Temp 07/19/20 1212 98.9 F (37.2 C)     Temp Source 07/19/20 1212 Oral     SpO2 07/19/20 1212 97 %  Weight --      Height --      Head Circumference --      Peak Flow --      Pain Score 07/19/20 1210 4     Pain Loc --      Pain Edu? --      Excl. in [place]? --    No data found.  Updated Vital Signs BP 127/82 (BP Location: Right Arm)   Pulse 81   Temp 98.9 F (37.2 C) (Oral)   Resp 19   SpO2 97%   Visual Acuity Right Eye Distance:   Left Eye Distance:   Bilateral Distance:    Right Eye Near:   Left Eye Near:    Bilateral Near:     Vision 20/25 bilaterally, not wearing glasses.   Physical Exam Vitals reviewed. Exam conducted with a chaperone present.  Constitutional:      Appearance: He is obese.  HENT:     Head: Normocephalic and atraumatic.     Right Ear: Tympanic membrane normal. Tympanic membrane is not erythematous, retracted or bulging.     Left Ear: Tympanic membrane normal. Tympanic membrane is not erythematous, retracted or bulging.     Ears:     Comments: Hearing aids in place    Nose: No congestion.     Mouth/Throat:     Mouth: Mucous membranes are moist.     Pharynx: Oropharynx is clear. Uvula midline. No oropharyngeal exudate, posterior oropharyngeal  erythema or uvula swelling.     Tonsils: No tonsillar exudate or tonsillar abscesses. 0 on the right. 0 on the left.  Eyes:     General: Lids are normal. Vision grossly intact. Gaze aligned appropriately. No visual field deficit.       Right eye: No foreign body, discharge or hordeolum.        Left eye: No foreign body, discharge or hordeolum.     Extraocular Movements: Extraocular movements intact.     Right eye: No nystagmus.     Left eye: No nystagmus.     Conjunctiva/sclera: Conjunctivae normal.     Right eye: Right conjunctiva is not injected. No chemosis, exudate or hemorrhage.    Left eye: Left conjunctiva is not injected. No chemosis, exudate or hemorrhage.    Pupils: Pupils are equal, round, and reactive to light.     Right eye: Pupil is reactive and not sluggish.     Left eye: Pupil is reactive and not sluggish.     Visual Fields: Right eye visual fields normal and left eye visual fields normal.     Comments: PERRLA, EOMI, full visual fields  Cardiovascular:     Rate and Rhythm: Normal rate and regular rhythm.     Heart sounds: Normal heart sounds.  Pulmonary:     Effort: Pulmonary effort is normal.     Breath sounds: Normal breath sounds and air entry.  Abdominal:     General: Bowel sounds are normal.     Palpations: Abdomen is soft.     Tenderness: There is no abdominal tenderness. There is no right CVA tenderness, left CVA tenderness, guarding or rebound. Negative signs include Murphy's sign, Rovsing's sign, McBurney's sign and psoas sign.     Comments: Pt does not have feeling below the waist  Genitourinary:    Comments: Patient wearing diaper Significant stool in diaper and on buttocks Rectum appears normal, without hernia or fissure.no internal hemorrhoids palpated. Stool appears light brown. Musculoskeletal:     Comments:  Right ankle with swelling and faint ecchymosis over lateral malleolus. ROM intact. Cap refill <2 seconds, DP 2+. Decreased sensation at  baseline.  No other tenderness, abrasion, ecchymosis, deformity.  Lymphadenopathy:     Cervical: No cervical adenopathy.  Neurological:     Mental Status: He is alert.  Psychiatric:        Mood and Affect: Mood normal.     Comments: Forgetful      UC Treatments / Results  Labs (all labs ordered are listed, but only abnormal results are displayed) Labs Reviewed  POC OCCULT BLOOD, ED    EKG   Radiology No results found.  Procedures Procedures (including critical care time)  Medications Ordered in UC Medications - No data to display  Initial Impression / Assessment and Plan / UC Course  I have reviewed the triage vital signs and the nursing notes.  Pertinent labs & imaging results that were available during my care of the patient were reviewed by me and considered in my medical decision making (see chart for details).      This patient is a 65 year old male presenting with multiple complaints: Rectal pain, blurred vision, allergic rhinitis/sore throat, incontinence, ankle strain.  Patient states he has had rectal pain and blurred vision for decades, but [facility] despite being his primary [facility].  For rectal pain, benign exam.  Negative occult blood. Suspect pain is related to the manual disimpaction he does daily.  Last colonoscopy was 2 years ago and was normal per patient.  Advised him to follow-up with his primary care for this, would consider another colonoscopy.  For ankle pain, patient did not follow-up with Ortho as advised.  I again provided this information. Continue CAM boot and using walker until then.  For sore throat, symptoms consistent with allergic rhinitis.  Zyrtec sent.  He declines Covid test today.  For blurred vision, benign exam today.  Visual acuity intact.  Discussed that he must follow-up with an ophthalmologist who can provide him with a full dilated eye exam.  This chart was dictated using voice  recognition software, Dragon. Despite the best efforts of this provider to proofread and correct errors, errors may still occur which can change documentation meaning.  I am coding this visit a Level 5, as we discussed 5 separate conditions, both acute and chronic. Multiple of these conditions, including blurred vision and rectal pain, can be emergent and life threatening. I also prescribed medications for management of these symptoms. I spent >50 minutes with this patient, evaluating his many complaints.   Final Clinical Impressions(s) / UC Diagnoses   Final diagnoses:  Rectal pain  Blurred vision  Seasonal allergic rhinitis due to other allergic trigger  Full incontinence of feces  Sprain of anterior talofibular ligament of right ankle, subsequent encounter  Uses walker     Discharge Instructions     -Please follow-up with your primary care provider for your multiple chronic conditions.  Your primary care is better suited to evaluate your chronic conditions and send you to the appropriate specialist for these.  If your primary care is the [place], they should be able to do this for you. -You need an eye exam by an ophthalmologist.  It sounds like you already have one scheduled.  Please follow-up with them.  We cannot provide a full eye exam here.  If you have any new symptoms like vision changes, dizziness, worst headache of life, vision loss, head straight  to the ER for urgent evaluation. -For your rectal pain, also follow-up with your primary care provider. You may need another colonoscopy, which they can order for you. -For your foot pain, please follow-up with an orthopedist as advised at your last visit.  I put information for this below again.  Please call them to schedule appointment.  You can also follow-up with your primary care provider for this. -Your sore throat is caused by allergies.  I sent a prescription for Zyrtec.  Please start this.    ED Prescriptions    Medication Sig  Dispense Auth. Provider   cetirizine (ZYRTEC ALLERGY) 10 MG tablet Take 1 tablet (10 mg total) by mouth daily. 30 tablet Hazel Sams, PA-C     PDMP not reviewed this encounter.   Hazel Sams, PA-C 07/19/20 1322    Hazel Sams, PA-C 07/19/20 1323
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
## 2020-10-20 ENCOUNTER — Encounter (HOSPITAL_COMMUNITY): Payer: Self-pay | Admitting: Emergency Medicine

## 2020-10-20 ENCOUNTER — Ambulatory Visit (HOSPITAL_COMMUNITY)
Admission: EM | Admit: 2020-10-20 | Discharge: 2020-10-20 | Disposition: A | Discharge status: Home / Self Care | Payer: No Typology Code available for payment source | Attending: Family Medicine | Admitting: Family Medicine

## 2020-10-20 ENCOUNTER — Ambulatory Visit (INDEPENDENT_AMBULATORY_CARE_PROVIDER_SITE_OTHER): Payer: No Typology Code available for payment source

## 2020-10-20 ENCOUNTER — Ambulatory Visit (HOSPITAL_COMMUNITY): Payer: No Typology Code available for payment source

## 2020-10-20 ENCOUNTER — Other Ambulatory Visit: Payer: Self-pay

## 2020-10-20 DIAGNOSIS — S39012A Strain of muscle, fascia and tendon of lower back, initial encounter: Secondary | ICD-10-CM

## 2020-10-20 DIAGNOSIS — S161XXA Strain of muscle, fascia and tendon at neck level, initial encounter: Secondary | ICD-10-CM

## 2020-10-20 DIAGNOSIS — S29019A Strain of muscle and tendon of unspecified wall of thorax, initial encounter: Secondary | ICD-10-CM

## 2020-10-20 DIAGNOSIS — S29012A Strain of muscle and tendon of back wall of thorax, initial encounter: Secondary | ICD-10-CM | POA: Diagnosis not present
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
## 2020-10-20 MED ORDER — KETOROLAC TROMETHAMINE 30 MG/ML IJ SOLN
INTRAMUSCULAR | Status: AC
  Filled 2020-10-20: qty 1

## 2020-10-20 MED ORDER — CYCLOBENZAPRINE HCL 5 MG PO TABS: 5.0000 mg | ORAL_TABLET | Freq: Every evening | ORAL | 0 refills | Status: DC | PRN

## 2020-10-20 MED ORDER — KETOROLAC TROMETHAMINE 30 MG/ML IJ SOLN
30.0000 mg | Freq: Once | INTRAMUSCULAR | Status: AC
  Administered 2020-10-20: 30 mg via INTRAMUSCULAR

## 2020-10-20 MED ORDER — NAPROXEN 500 MG PO TABS: 500.0000 mg | ORAL_TABLET | Freq: Two times a day (BID) | ORAL | 0 refills | Status: DC | PRN
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
## 2020-10-20 NOTE — Discharge Instructions (Addendum)
Copied from Radiology reports -  "1. No acute findings. No fracture or acute subluxation seen in the lumbar spine. 2. Mild degenerative change within the lower lumbar spine."  "Negative clearing view of cervical spine. No acute fracture or evidence of acute vertebral body subluxation is identified."  "1. No acute findings. No fracture or acute vertebral body subluxation seen. 2. Degenerative spondylosis within the mid and lower lumbar spine has increased, now mild to moderate in degree, with associated mild osseous spurring. 3. Increased kyphosis, moderate in degree.  Stable mild scoliosis."

## 2020-11-20 ENCOUNTER — Encounter (HOSPITAL_COMMUNITY): Payer: Self-pay | Admitting: Emergency Medicine

## 2020-11-20 ENCOUNTER — Ambulatory Visit (HOSPITAL_COMMUNITY)
Admission: EM | Admit: 2020-11-20 | Discharge: 2020-11-20 | Disposition: A | Discharge status: Home / Self Care | Payer: No Typology Code available for payment source | Attending: Emergency Medicine | Admitting: Emergency Medicine

## 2020-11-20 ENCOUNTER — Other Ambulatory Visit: Payer: Self-pay

## 2020-11-20 ENCOUNTER — Ambulatory Visit (INDEPENDENT_AMBULATORY_CARE_PROVIDER_SITE_OTHER): Payer: No Typology Code available for payment source

## 2020-11-20 ENCOUNTER — Ambulatory Visit (HOSPITAL_COMMUNITY)
Admission: EM | Admit: 2020-11-20 | Discharge: 2020-11-20 | Disposition: A | Discharge status: Home / Self Care | Payer: Self-pay

## 2020-11-20 DIAGNOSIS — W19XXXA Unspecified fall, initial encounter: Secondary | ICD-10-CM

## 2020-11-20 DIAGNOSIS — M25511 Pain in right shoulder: Secondary | ICD-10-CM

## 2020-11-20 IMAGING — DX DG SHOULDER 2+V*R*
4 series · 4 of 4 positions shown · non-contrast
Comparison: None.

CLINICAL DATA: Shoulder pain fall

EXAM:
RIGHT SHOULDER - 2+ VIEW

[shoulder ap]
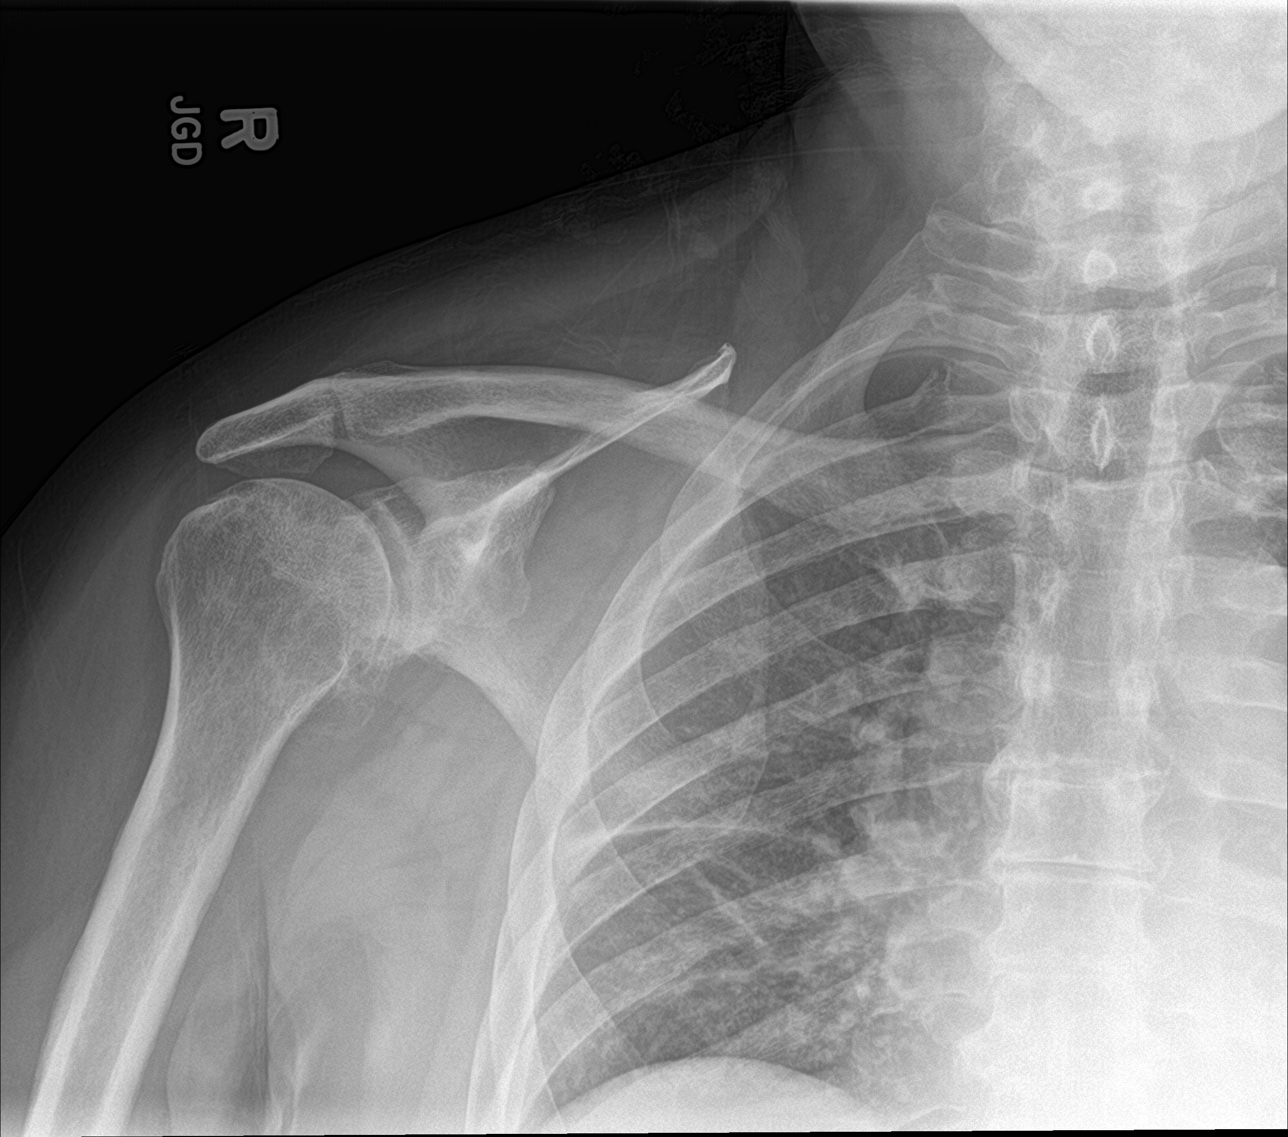

[shoulder grashey]
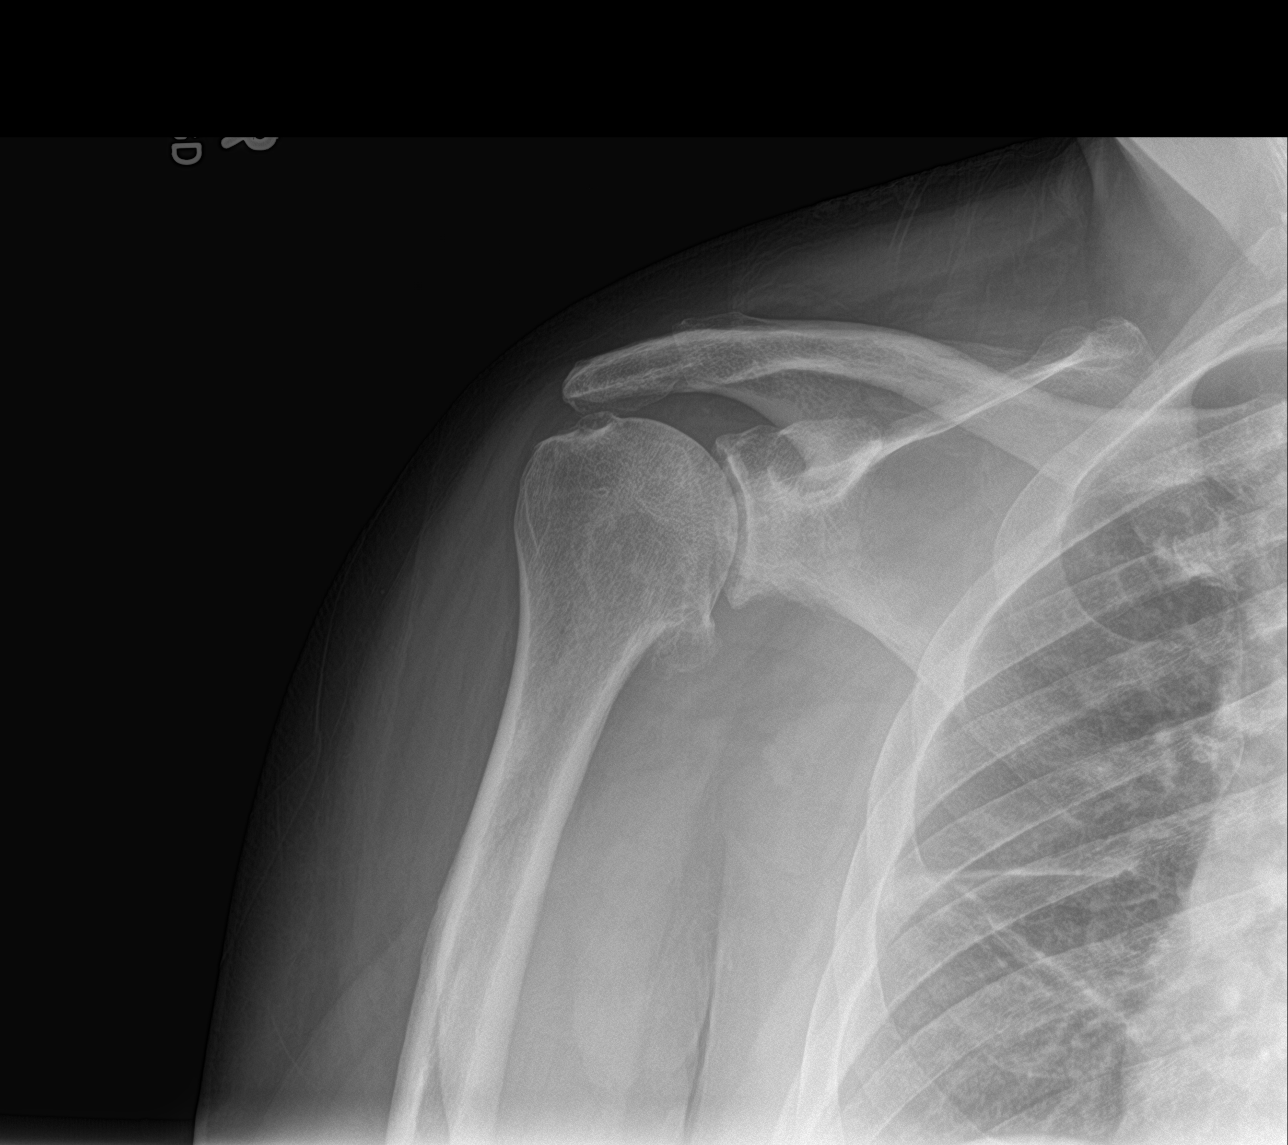

[shoulder y-view]
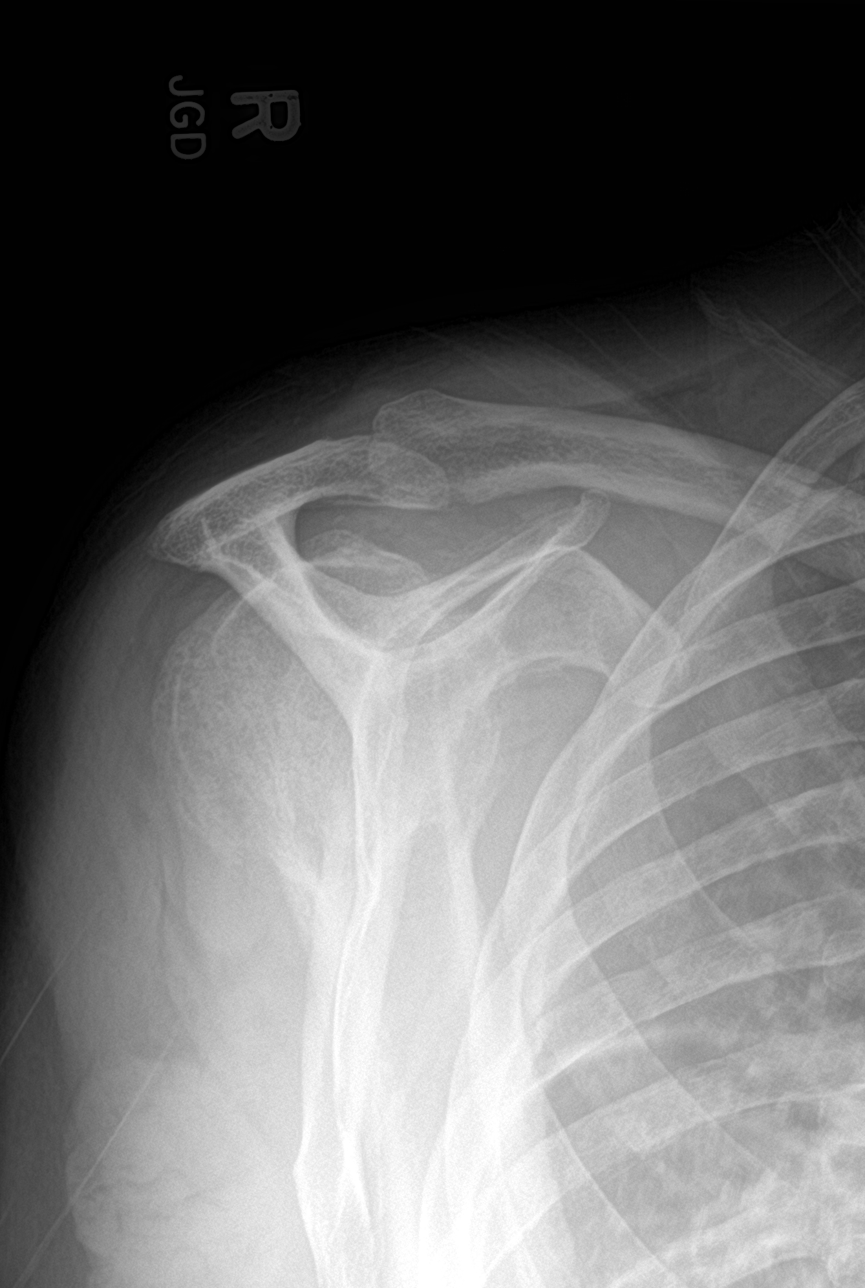

[shoulder axial]
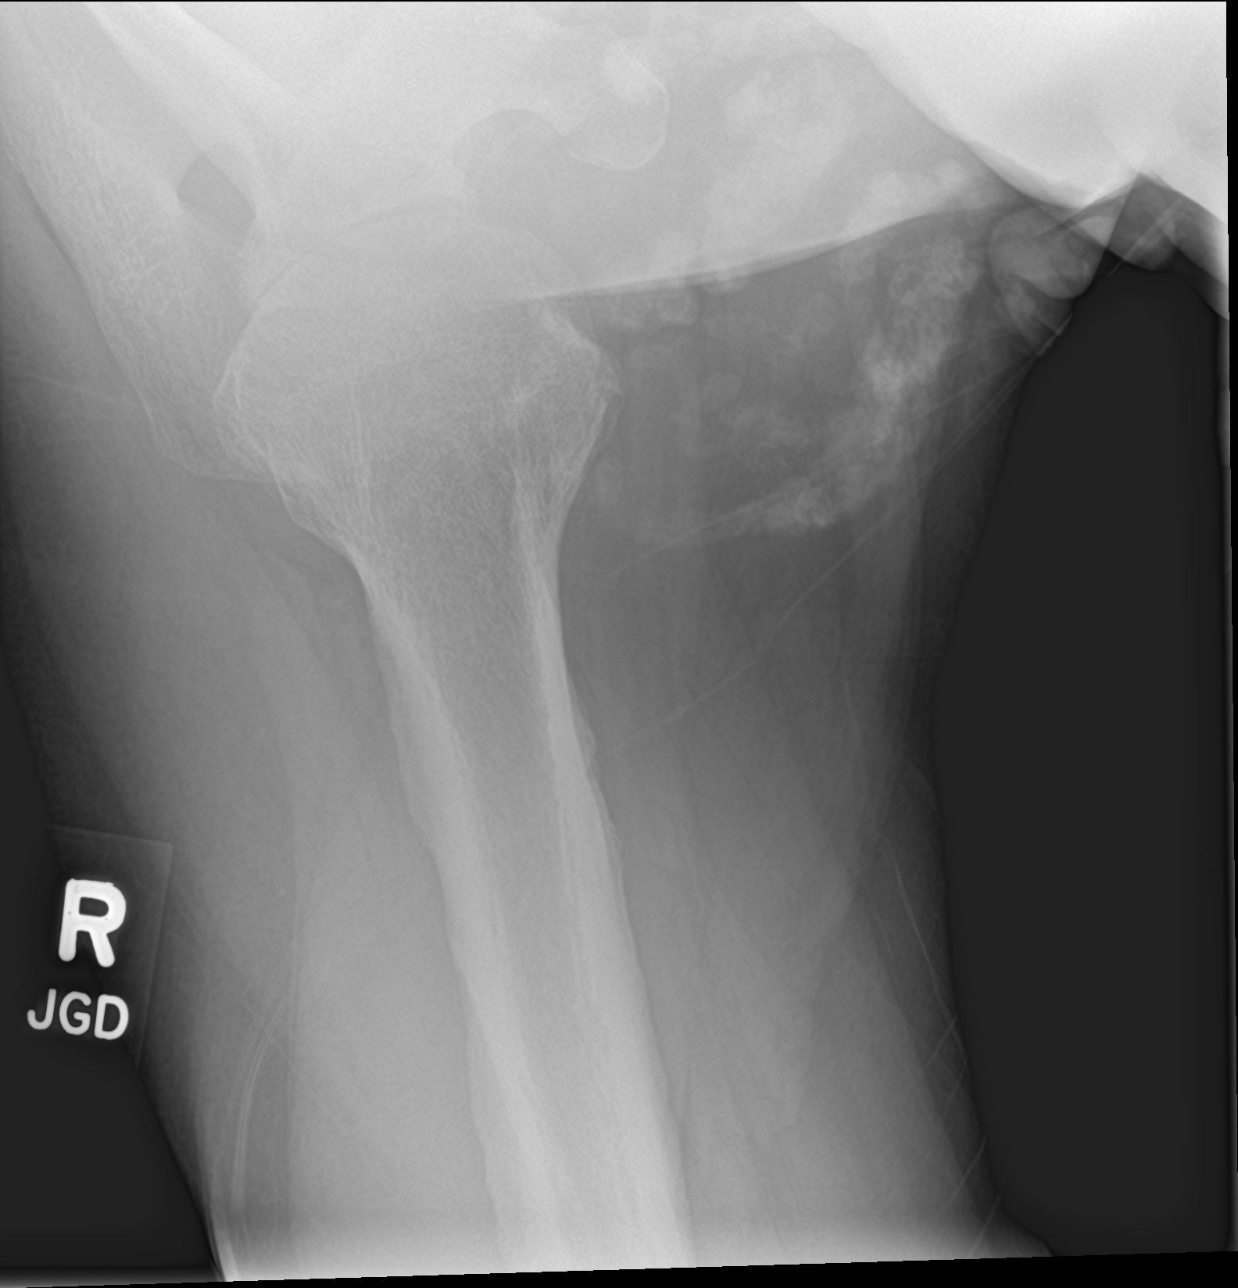

[4 of 4 positions shown; findings below may reference images not displayed]

FINDINGS: AC joint is intact. No fracture or dislocation. Advanced
glenohumeral degenerative change with potential late it osteophyte
inferiorly. Slightly narrowed subacromial space. Probable artifact
on axillary view
IMPRESSION: 1. No acute osseous abnormality
2. Advanced degenerative changes of the glenohumeral interval.
3. Slightly narrowed subacromial space suggesting rotator cuff
disease

## 2020-11-20 MED ORDER — NAPROXEN 500 MG PO TABS: 500.0000 mg | ORAL_TABLET | Freq: Two times a day (BID) | ORAL | 0 refills | Status: DC | PRN

## 2020-11-20 MED ORDER — DICLOFENAC SODIUM 1 % EX GEL: 2.0000 g | Freq: Four times a day (QID) | CUTANEOUS | 0 refills | Status: DC

## 2020-11-20 NOTE — Discharge Instructions (Addendum)
Patient is a high fall risk, make sure there is good lighting, supportive shoes and has assistive equipment such as walker available   Can use naproxen twice a day for the next 5 days then as needed to help with pain  Can use diclofenac gel four times a day for additional comfort  Can follow up with orthopedic specialist if pain persist

## 2020-11-20 NOTE — ED Triage Notes (Signed)
Pt presents with right shoulder pain xs 1 week. States slipped getting out of bed and landed on right shoulder.

## 2020-11-20 NOTE — ED Provider Notes (Signed)
[name]    CSN: [id number] Arrival date & time: 11/20/20  1630      History   Chief Complaint Chief Complaint  Patient presents with   Shoulder Pain    Right    HPI Timothy [name] is a 65 y.o. male.   Patient presents with right shoulder pain when lifting arm above head after attempting to pull self off floor after falling from bed this morning. Denies numbness and tingling, prior injury or trauma. Has history of frequent falls.   Past Medical History:  Diagnosis Date   Behavioral disorder    Cancer [facility])    Constipation    Gonorrhea     Patient Active Problem List   Diagnosis Date Noted   Constipation 02/02/2016   UTI (lower urinary tract infection)    Paranoid schizophrenia ([place]) 02/01/2016    Past Surgical History:  Procedure Laterality Date   BACK SURGERY         Home Medications    Prior to Admission medications   Medication Sig Start Date End Date Taking? Authorizing Provider  diclofenac Sodium (VOLTAREN) 1 % GEL Apply 2 g topically 4 (four) times daily. 11/20/20  Yes Naiara Lombardozzi, Leitha Schuller, NP  baclofen (LIORESAL) 10 MG tablet Take 1 tablet (10 mg total) by mouth 3 (three) times daily as needed for muscle spasms. 06/18/20   Jaynee Eagles, PA-C  bisacodyl (DULCOLAX) 5 MG EC tablet Take 5 mg by mouth daily as needed for moderate constipation.    [provider]  Calcium Carb-Cholecalciferol (CALCIUM-VITAMIN D) 500-400 MG-UNIT TABS Take 1 tablet by mouth daily.    [provider]  cetirizine (ZYRTEC ALLERGY) 10 MG tablet Take 1 tablet (10 mg total) by mouth daily. 07/19/20   Hazel Sams, PA-C  cyclobenzaprine (FLEXERIL) 5 MG tablet Take 1 tablet (5 mg total) by mouth at bedtime as needed for muscle spasms. Do not drink alcohol, drive, or take sleeping medication while taking this medication.  May cause drowsiness. 10/20/20   Volney American, PA-C  diazepam (VALIUM) 5 MG tablet Take 2.5 mg by mouth at bedtime as needed for  anxiety. Patient not taking: Reported on 10/20/2020    [provider]  diphenhydrAMINE (BENADRYL) 25 mg capsule Take 25 mg by mouth at bedtime as needed for itching or sleep.    [provider]  gabapentin (NEURONTIN) 300 MG capsule Take 1 capsule (300 mg total) by mouth 3 (three) times daily. Patient not taking: Reported on 10/20/2020 06/18/20   Jaynee Eagles, PA-C  haloperidol (HALDOL) 5 MG tablet Take 5 mg by mouth at bedtime.    [provider]  haloperidol decanoate (HALDOL DECANOATE) 50 MG/ML injection Inject 50 mg into the muscle every 28 (twenty-eight) days.    [provider]  lactulose (CHRONULAC) 10 GM/15ML solution Take 20 g by mouth 3 (three) times a week.    [provider]  naproxen (NAPROSYN) 500 MG tablet Take 1 tablet (500 mg total) by mouth 2 (two) times daily as needed. 11/20/20   Meko Masterson, Leitha Schuller, NP  omeprazole (PRILOSEC) 20 MG capsule Take 40 mg by mouth 2 (two) times daily before a meal.    [provider]  oxybutynin (DITROPAN) 5 MG tablet Take 5 mg by mouth 3 (three) times daily as needed for bladder spasms.    [provider]  polyethylene glycol (MIRALAX / GLYCOLAX) packet Take 17 g by mouth 3 (three) times a week.    [provider]  pseudoephedrine (SUDAFED) 30 MG tablet Take 30 mg by mouth every 6 (six) hours as needed for congestion. Patient not taking: Reported on 10/20/2020    [provider]  sertraline (ZOLOFT) 50 MG tablet Take 50 mg by mouth daily.    [provider]  sulfamethoxazole-trimethoprim (BACTRIM DS) 800-160 MG tablet Take 1 tablet by mouth 2 (two) times daily. Patient not taking: Reported on 10/20/2020 02/02/16   Francine Graven, DO  VIAGRA 100 MG tablet Take 100 mg by mouth as needed for erectile dysfunction.  01/24/16   [provider]    Family History Family History  Family history unknown: Yes    Social History Social History   Tobacco Use   Smoking  status: Every Day    Packs/day: 1.00    Pack years: 0.00    Types: Cigarettes   Smokeless tobacco: Never  Vaping Use   Vaping Use: Never used  Substance Use Topics   Alcohol use: No   Drug use: No     Allergies   Penicillins   Review of Systems Review of Systems  Constitutional: Negative.   Respiratory: Negative.    Cardiovascular: Negative.   Musculoskeletal: Negative.   Skin: Negative.     Physical Exam Triage Vital Signs ED Triage Vitals  Enc Vitals Group     BP 11/20/20 1722 138/86     Pulse Rate 11/20/20 1722 81     Resp 11/20/20 1722 18     Temp 11/20/20 1722 98.7 F (37.1 C)     Temp Source 11/20/20 1722 Oral     SpO2 11/20/20 1722 100 %     Weight --      Height --      Head Circumference --      Peak Flow --      Pain Score 11/20/20 1719 4     Pain Loc --      Pain Edu? --      Excl. in [place]? --    No data found.  Updated Vital Signs BP 138/86 (BP Location: Right Arm)   Pulse 81   Temp 98.7 F (37.1 C) (Oral)   Resp 18   SpO2 100%   Visual Acuity Right Eye Distance:   Left Eye Distance:   Bilateral Distance:    Right Eye Near:   Left Eye Near:    Bilateral Near:     Physical Exam Constitutional:      Appearance: Normal appearance. He is normal weight.  HENT:     Head: Normocephalic.  Eyes:     Extraocular Movements: Extraocular movements intact.  Pulmonary:     Effort: Pulmonary effort is normal.  Musculoskeletal:     Cervical back: Normal range of motion.     Comments: Tenderness over anterior of shoulder, positive Hawking sign, positive Neer signs, no swelling or erythema noted, ROM intact but elicits pain   Skin:    General: Skin is warm and dry.  Neurological:     General: No focal deficit present.     Mental Status: He is alert and oriented to person, place, and time. Mental status is at baseline.  Psychiatric:        Mood and Affect: Mood normal.        Behavior: Behavior normal.     UC Treatments / Results   Labs (all labs ordered are listed, but only abnormal results are displayed) Labs Reviewed - No data to display  EKG   Radiology  DG Shoulder Right  Result Date: 11/20/2020 CLINICAL DATA:  Shoulder pain fall EXAM: RIGHT SHOULDER - 2+ VIEW COMPARISON:  None. FINDINGS: AC joint is intact. No fracture or dislocation. Advanced glenohumeral degenerative change with potential late it osteophyte inferiorly. Slightly narrowed subacromial space. Probable artifact on axillary view IMPRESSION: 1. No acute osseous abnormality 2. Advanced degenerative changes of the glenohumeral interval. 3. Slightly narrowed subacromial space suggesting rotator cuff disease Electronically Signed   By: Donavan Foil M.D.   On: 11/20/2020 17:58    Procedures Procedures (including critical care time)  Medications Ordered in UC Medications - No data to display  Initial Impression / Assessment and Plan / UC Course  I have reviewed the triage vital signs and the nursing notes.  Pertinent labs & imaging results that were available during my care of the patient were reviewed by me and considered in my medical decision making (see chart for details).  Acute pain of right shoulder  X-ray- degenerative changes and rotator cuff disease, defer to results for details  Naproxen 500 mg bid for 5 days then as needed Diclofenac gel 1% 4 times a day prn Orthopedic follow up for persistent pain  Final Clinical Impressions(s) / UC Diagnoses   Final diagnoses:  Acute pain of right shoulder     Discharge Instructions      Patient is a high fall risk, make sure there is good lighting, supportive shoes and has assistive equipment such as walker available   Can use naproxen twice a day for the next 5 days then as needed to help with pain  Can use diclofenac gel four times a day for additional comfort  Can follow up with orthopedic specialist if pain persist    ED Prescriptions     Medication Sig Dispense Auth. Provider    naproxen (NAPROSYN) 500 MG tablet Take 1 tablet (500 mg total) by mouth 2 (two) times daily as needed. 30 tablet Suhey Radford R, NP   diclofenac Sodium (VOLTAREN) 1 % GEL Apply 2 g topically 4 (four) times daily. 50 g Hans Eden, NP      PDMP not reviewed this encounter.   Hans Eden, [place] 11/20/20 [phone]

## 2021-02-22 ENCOUNTER — Other Ambulatory Visit: Payer: Self-pay

## 2021-02-22 DIAGNOSIS — Z85828 Personal history of other malignant neoplasm of skin: Secondary | ICD-10-CM | POA: Insufficient documentation

## 2021-02-22 DIAGNOSIS — M545 Low back pain, unspecified: Secondary | ICD-10-CM | POA: Diagnosis not present

## 2021-02-22 DIAGNOSIS — R531 Weakness: Secondary | ICD-10-CM | POA: Diagnosis not present

## 2021-02-22 DIAGNOSIS — R600 Localized edema: Secondary | ICD-10-CM | POA: Insufficient documentation

## 2021-02-22 DIAGNOSIS — F17210 Nicotine dependence, cigarettes, uncomplicated: Secondary | ICD-10-CM | POA: Insufficient documentation

## 2021-02-23 ENCOUNTER — Emergency Department (HOSPITAL_COMMUNITY): Payer: No Typology Code available for payment source

## 2021-02-23 ENCOUNTER — Emergency Department (HOSPITAL_COMMUNITY)
Admission: EM | Admit: 2021-02-23 | Discharge: 2021-02-23 | Disposition: A | Discharge status: Home / Self Care | Payer: No Typology Code available for payment source | Attending: Emergency Medicine | Admitting: Emergency Medicine

## 2021-02-23 ENCOUNTER — Encounter (HOSPITAL_COMMUNITY): Payer: Self-pay | Admitting: Emergency Medicine

## 2021-02-23 DIAGNOSIS — M545 Low back pain, unspecified: Secondary | ICD-10-CM

## 2021-02-23 LAB — URINALYSIS, ROUTINE W REFLEX MICROSCOPIC
Bilirubin Urine: NEGATIVE
Glucose, UA: NEGATIVE mg/dL
Ketones, ur: NEGATIVE mg/dL
Leukocytes,Ua: NEGATIVE
Nitrite: NEGATIVE
Protein, ur: NEGATIVE mg/dL
Specific Gravity, Urine: 1.002 — ABNORMAL LOW (ref 1.005–1.030)
pH: 6 (ref 5.0–8.0)

## 2021-02-23 LAB — COMPREHENSIVE METABOLIC PANEL
ALT: 16 U/L (ref 0–44)
AST: 16 U/L (ref 15–41)
Albumin: 3.9 g/dL (ref 3.5–5.0)
Alkaline Phosphatase: 86 U/L (ref 38–126)
Anion gap: 10 (ref 5–15)
BUN: 7 mg/dL — ABNORMAL LOW (ref 8–23)
CO2: 24 mmol/L (ref 22–32)
Calcium: 8.8 mg/dL — ABNORMAL LOW (ref 8.9–10.3)
Chloride: 101 mmol/L (ref 98–111)
Creatinine, Ser: 0.92 mg/dL (ref 0.61–1.24)
GFR, Estimated: >60 mL/min (ref >60)
Glucose, Bld: 100 mg/dL — ABNORMAL HIGH (ref 70–99)
Potassium: 3.3 mmol/L — ABNORMAL LOW (ref 3.5–5.1)
Sodium: 135 mmol/L (ref 135–145)
Total Bilirubin: 0.4 mg/dL (ref 0.3–1.2)
Total Protein: 6.9 g/dL (ref 6.5–8.1)

## 2021-02-23 LAB — CBC WITH DIFFERENTIAL/PLATELET
Abs Immature Granulocytes: 0.03 10*3/uL (ref 0.00–0.07)
Basophils Absolute: 0 10*3/uL (ref 0.0–0.1)
Basophils Relative: 1 %
Eosinophils Absolute: 0.5 10*3/uL (ref 0.0–0.5)
Eosinophils Relative: 7 %
HCT: 37.2 % — ABNORMAL LOW (ref 39.0–52.0)
Hemoglobin: 12.7 g/dL — ABNORMAL LOW (ref 13.0–17.0)
Immature Granulocytes: 0 %
Lymphocytes Relative: 18 %
Lymphs Abs: 1.2 10*3/uL (ref 0.7–4.0)
MCH: 31.2 pg (ref 26.0–34.0)
MCHC: 34.1 g/dL (ref 30.0–36.0)
MCV: 91.4 fL (ref 80.0–100.0)
Monocytes Absolute: 0.6 10*3/uL (ref 0.1–1.0)
Monocytes Relative: 10 %
Neutro Abs: 4.3 10*3/uL (ref 1.7–7.7)
Neutrophils Relative %: 64 %
Platelets: 220 10*3/uL (ref 150–400)
RBC: 4.07 MIL/uL — ABNORMAL LOW (ref 4.22–5.81)
RDW: 12.8 % (ref 11.5–15.5)
WBC: 6.7 10*3/uL (ref 4.0–10.5)
nRBC: 0 % (ref 0.0–0.2)

## 2021-02-23 LAB — TROPONIN I (HIGH SENSITIVITY)
Troponin I (High Sensitivity): 25 ng/L — ABNORMAL HIGH (ref <18)
Troponin I (High Sensitivity): 21 ng/L — ABNORMAL HIGH (ref <18)

## 2021-02-23 LAB — BRAIN NATRIURETIC PEPTIDE: B Natriuretic Peptide: 9.2 pg/mL (ref 0.0–100.0)

## 2021-02-23 IMAGING — DX DG CHEST 1V PORT
2 series · 2 of 2 positions shown · non-contrast
Comparison: [DATE]

CLINICAL DATA: Weakness. Ongoing issues with urinary tract
infection on Cipro.

EXAM:
PORTABLE CHEST 1 VIEW

[chest ap (1 of 2)]
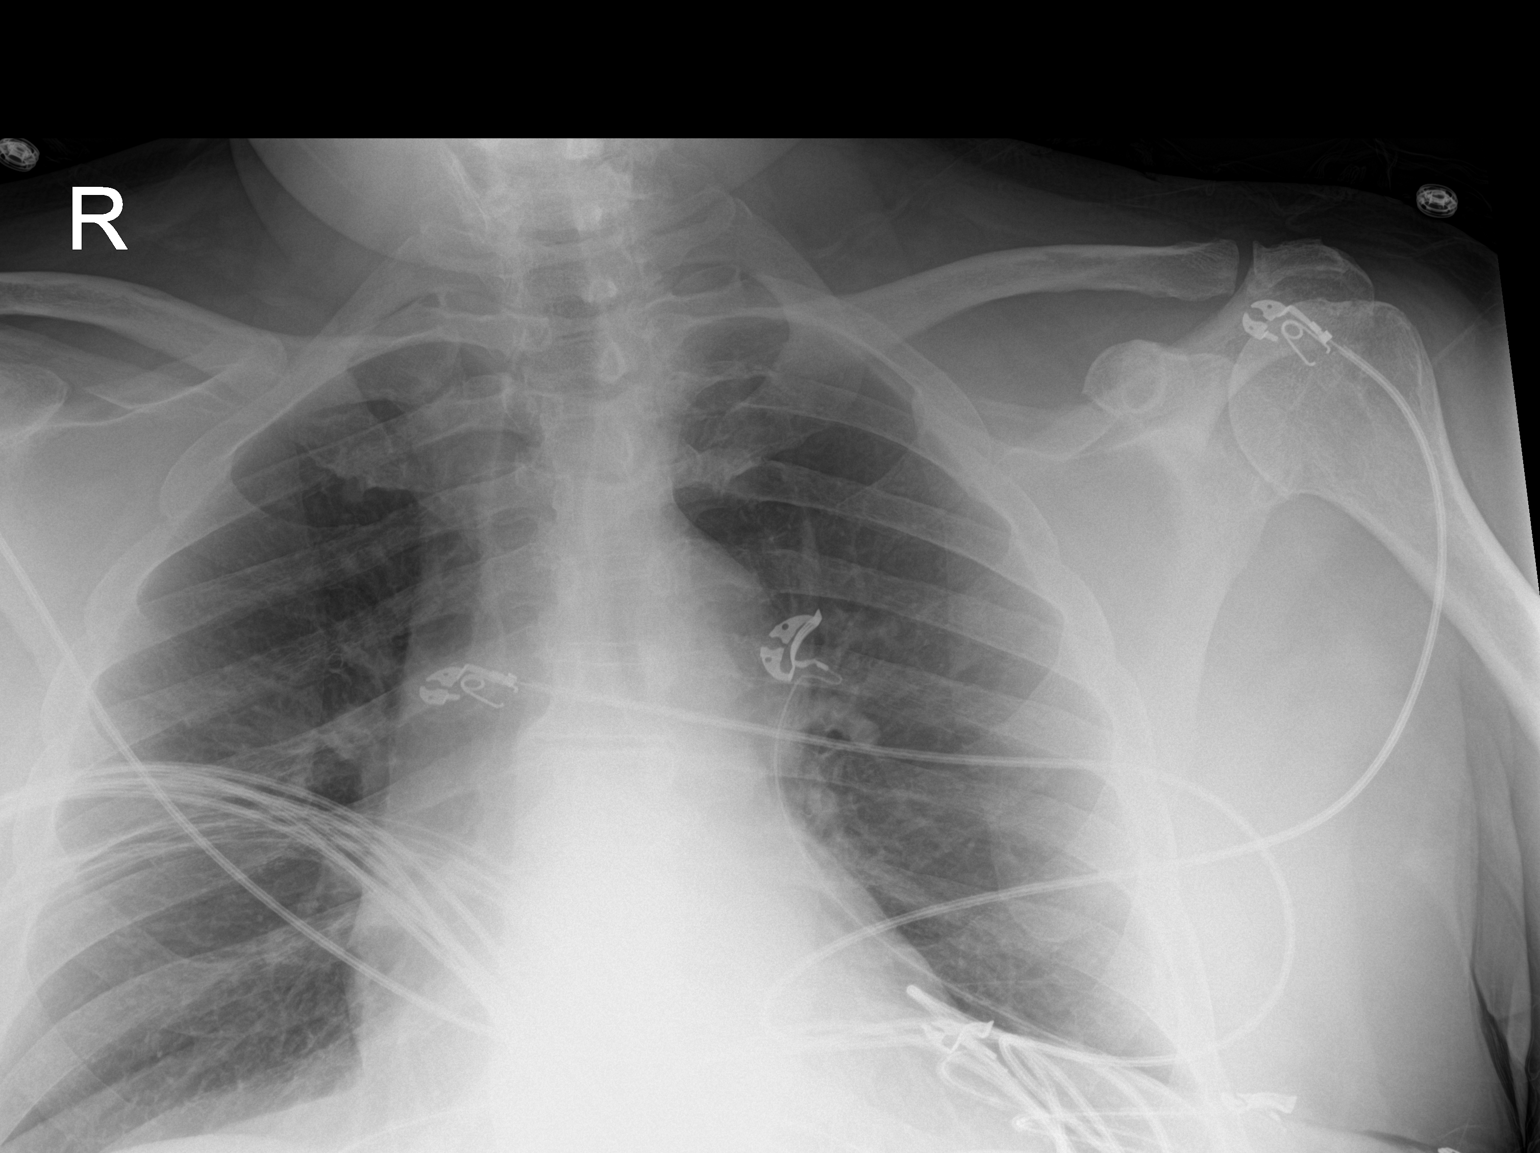

[chest ap (2 of 2)]
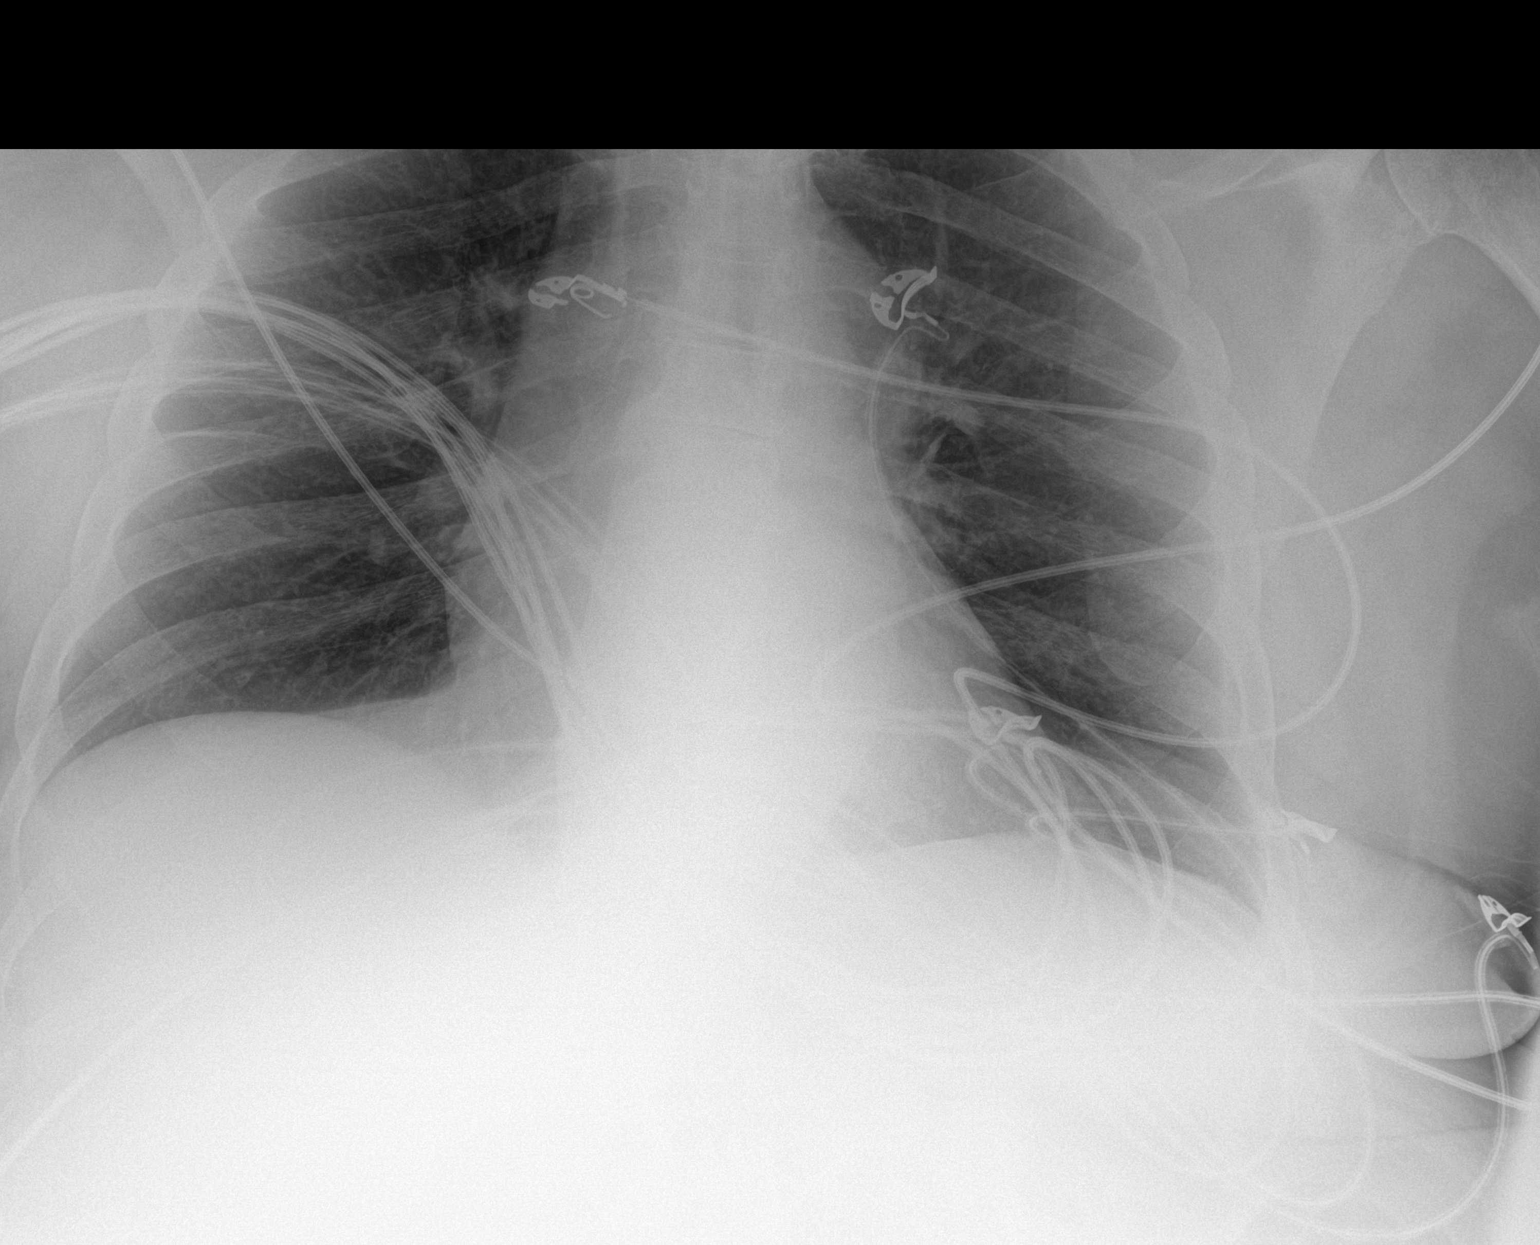

[2 of 2 positions shown; findings below may reference images not displayed]

FINDINGS: Shallow inspiration. Heart size and pulmonary vascularity are
normal. Lungs are clear. No pleural effusions. No pneumothorax.
Mediastinal contours appear intact.
IMPRESSION: No active disease.

## 2021-02-23 IMAGING — MR MR LUMBAR SPINE W/O CM
4 of 5 series · 28 of 48 positions shown · non-contrast
Comparison: Lumbar radiographs [DATE] and earlier. CT Abdomen
and Pelvis [DATE].

CLINICAL DATA: 65-year-old male with low back pain. History of
prior surgery.

EXAM:
MRI LUMBAR SPINE WITHOUT CONTRAST
TECHNIQUE: Multiplanar, multisequence MR imaging of the lumbar spine was
performed. No intravenous contrast was administered.

[Series 5: T1 · sagittal · 4.0mm · 0.81mm/px · 4 of 17 slices shown (1 of 2)]
[im 1/17]
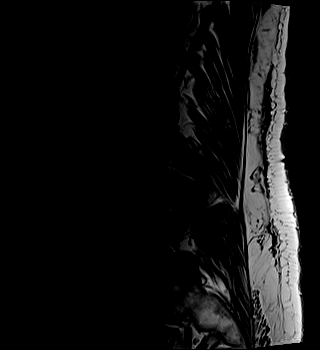
[im 6/17]
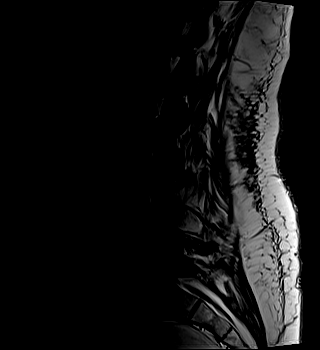
[im 11/17]
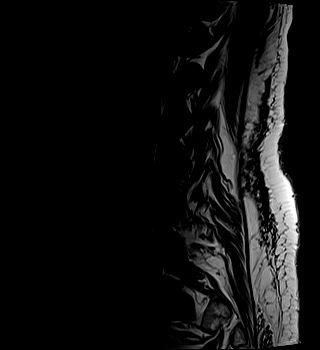
[im 17/17]
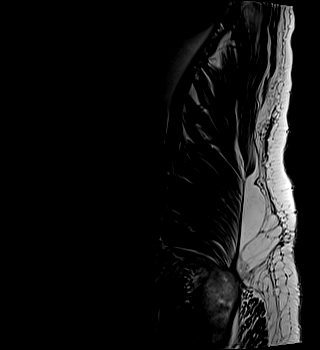

[Series 6: T2 · sagittal · 4.0mm · 0.81mm/px · 6 of 20 slices shown (1 of 2)]
[im 1/20]
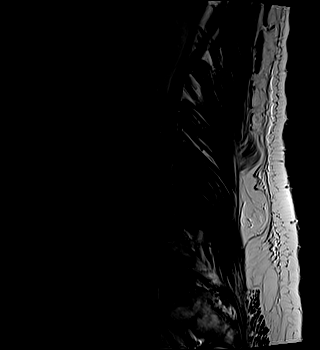
[im 4/20]
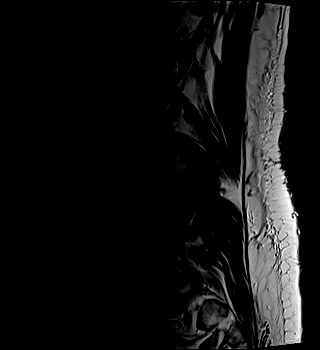
[im 8/20]
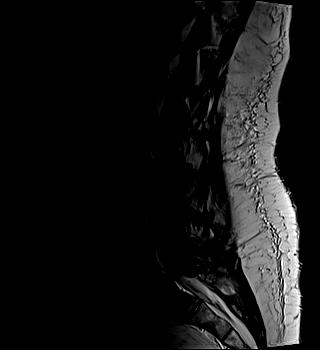
[im 12/20]
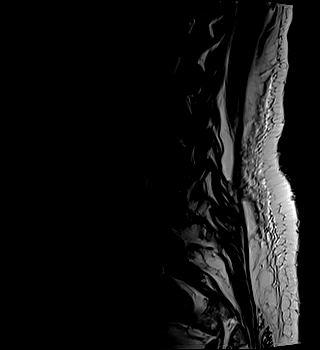
[im 16/20]
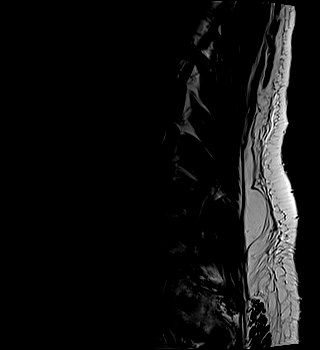
[im 20/20]
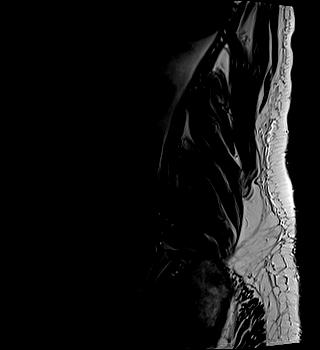

[Series 8: T2 · axial · 4.0mm · 0.62mm/px · z∈[-196,+63]mm · 10 of 57 slices shown (2 of 2)]
[im 4/57]
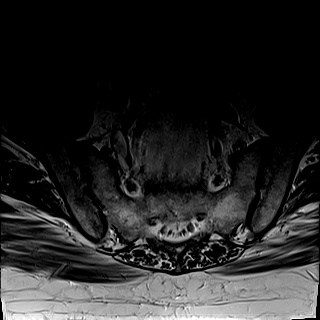
[im 8/57]
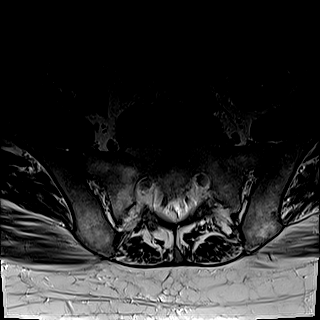
[im 12/57]
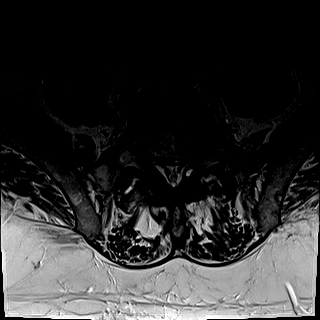
[im 19/57]
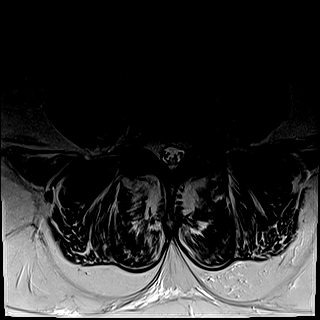
[im 27/57]
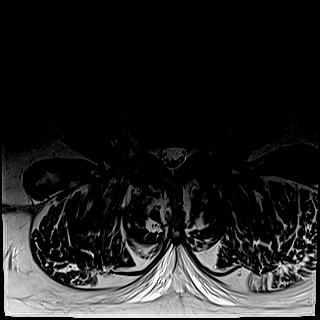
[im 30/57]
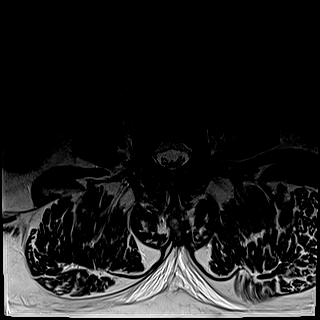
[im 34/57]
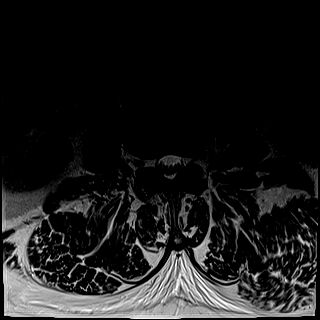
[im 42/57]
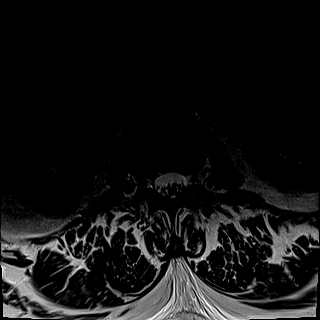
[im 49/57]
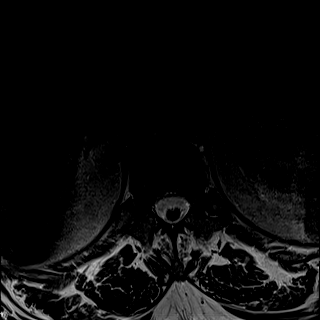
[im 57/57]
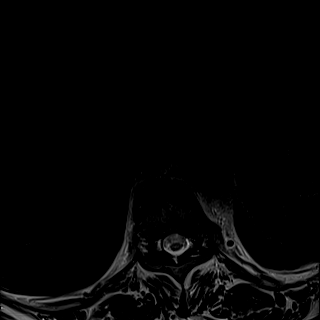

[Series 9: T1 · axial · 4.0mm · 0.39mm/px · z∈[-196,+24]mm · 8 of 57 slices shown (2 of 2)]
[im 4/57]
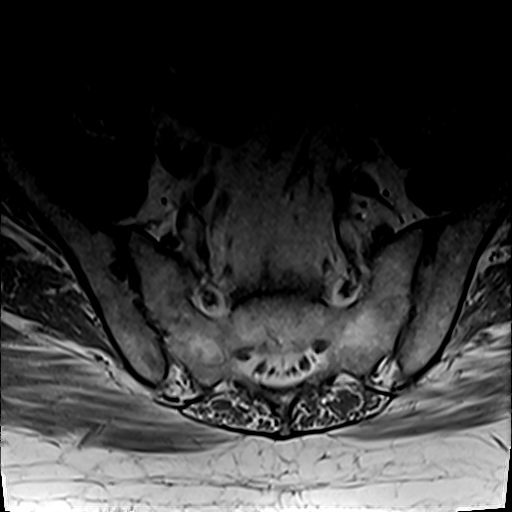
[im 8/57]
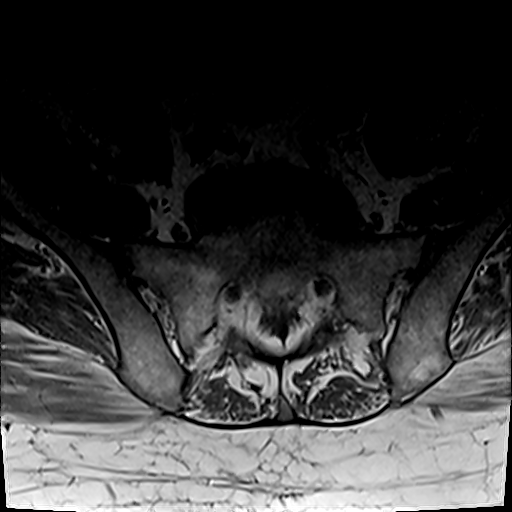
[im 12/57]
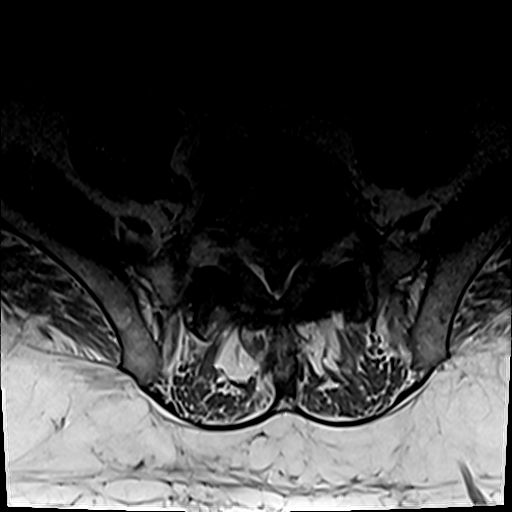
[im 19/57]
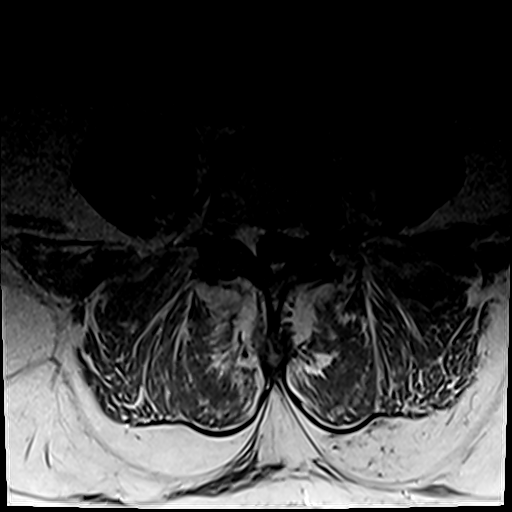
[im 27/57]
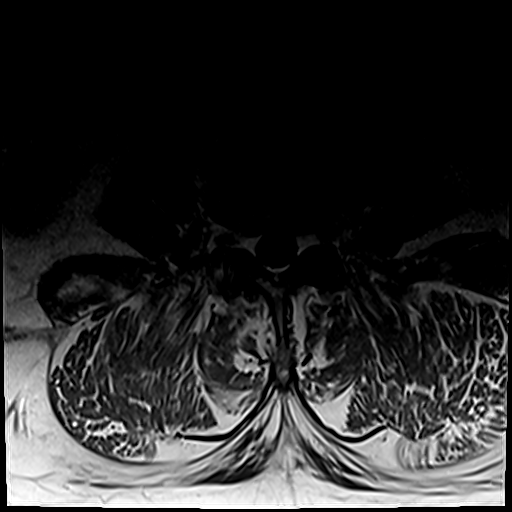
[im 30/57]
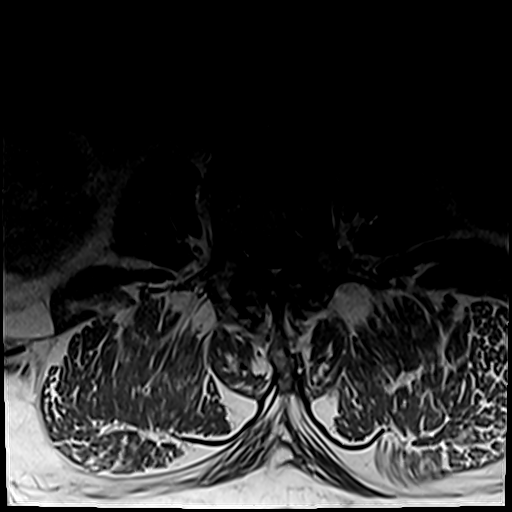
[im 34/57]
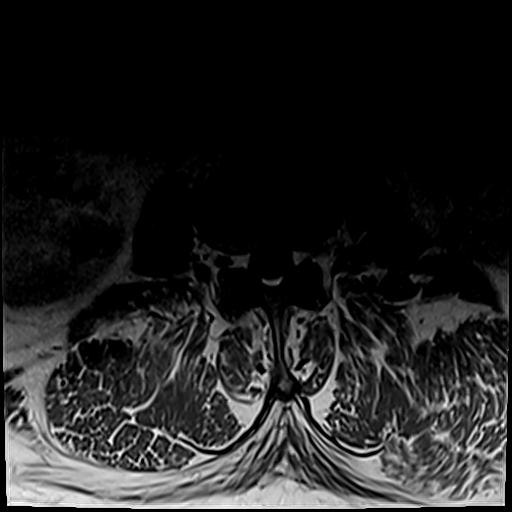
[im 49/57]
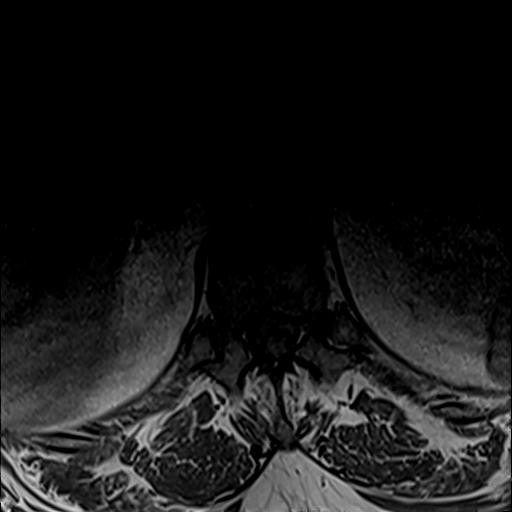

[28 of 48 positions shown; findings below may reference images not displayed]

FINDINGS: Segmentation:  Normal on the comparison radiographs.

Alignment: Mild grade 1 anterolisthesis has developed at L5-S1 since
[UV]. Otherwise stable lumbar lordosis.

Vertebrae: Mix of acute and chronic degenerative marrow signal
changes at the L5-S1 endplates, and also affecting the left
posterior elements at that level. See additional details below.
Normal background bone marrow signal. Intact visible sacrum and SI
joints.

Conus medullaris and cauda equina: Conus extends to the L1 level. No
lower spinal cord or conus signal abnormality.

Paraspinal and other soft tissues: Visualized abdominal viscera and
paraspinal soft tissues are within normal limits. Partially visible
urinary bladder distension in the pelvis.

Disc levels:

T11-T12 through L1-L2: Negative aside from facet hypertrophy.

L2-L3: Mild foraminal and far lateral disc bulging greater on the
left. Moderate facet and ligament flavum hypertrophy. Mild epidural
lipomatosis. Subtle left lateral recess annular fissure of the disc.
No spinal or lateral recess stenosis. Mild left L2 foraminal
stenosis.

L3-L4: Disc desiccation and mild circumferential disc bulge.
Moderate facet and ligament flavum hypertrophy. No spinal or lateral
recess stenosis. Mild L3 foraminal stenosis.

L4-L5: Disc desiccation. Left eccentric circumferential disc bulge
with endplate spurring. Moderate to severe facet and ligament flavum
hypertrophy. Capacious facet joints containing fluid. Epidural
lipomatosis.

Moderate spinal stenosis, maximal just below the disc space.
Moderate to severe left L4 foraminal stenosis, in part related to
asymmetric foraminal disc. Mild left lateral recess stenosis (left
L5 nerve level, see below). Mild right L4 foraminal stenosis.

L5-S1: Grade 1 anterolisthesis with disc space loss. Circumferential
disc osteophyte complex and severe facet hypertrophy. Moderate to
severe ligament flavum hypertrophy. Bilateral degenerative facet
joint fluid. And on the left side there are 3 distinct degenerative
synovial cysts identified. The largest is biconvex measuring up to
12 mm in length, about 5 mm diameter. See series 8, image 46 and
series 7, image 14. That lesion affects the left lateral recess at
the descending left S1 nerve level with stenosis. A smaller 4-5 mm
diameter cyst abuts the exiting left L5 nerve and contributes to the
moderate left neural foraminal stenosis (series 8, image 44). A
small left far lateral cyst should not result in neural compromise
on image 43. Superimposed epidural lipomatosis here. Moderate to
severe overall spinal stenosis. Mild to moderate right lateral
recess stenosis.
IMPRESSION: 1. Severe facet arthropathy and advanced disc degeneration at L5-S1
in the setting of mild grade 1 anterolisthesis which is new since
[UV]. Multiple left side synovial cysts contribute to stenosis at
the both exiting left L5 and descending left S1 nerve levels.
Correlate for corresponding radiculitis. And there is moderate to
severe overall spinal stenosis there.

2. Moderate multifactorial spinal stenosis at L4-L5, with up to
severe left L4 foraminal stenosis in part related to foraminal disc
herniation. Correlate for left L4 radiculitis.

3. Up to moderate lumbar facet arthropathy elsewhere, but relatively
mild disc degeneration.

## 2021-02-23 MED ORDER — IBUPROFEN 200 MG PO TABS
600.0000 mg | ORAL_TABLET | Freq: Once | ORAL | Status: AC
  Administered 2021-02-23: 600 mg via ORAL
  Filled 2021-02-23: qty 3

## 2021-02-23 MED ORDER — SODIUM CHLORIDE 0.9 % IV BOLUS
500.0000 mL | Freq: Once | INTRAVENOUS | Status: AC
  Administered 2021-02-23: 500 mL via INTRAVENOUS

## 2021-02-23 MED ORDER — NAPROXEN 500 MG PO TABS: 500.0000 mg | ORAL_TABLET | Freq: Two times a day (BID) | ORAL | 0 refills | Status: DC | PRN

## 2021-02-23 NOTE — ED Triage Notes (Addendum)
Patient here from home brought in by aide reporting ongoing issues with UTI while on Cipro. Reports increase weakness in legs states that he already has difficulty ambulating but normally can walk with walker. Incontinent of feces and urine.

## 2021-02-23 NOTE — ED Provider Notes (Signed)
[facility] DEPT Provider Note   CSN: [id number] Arrival date & time: 02/22/21  2339     History Chief Complaint  Patient presents with   Urinary Tract Infection    Timothy [name] is a 65 y.o. male.  Patient is a 65 year old male with past medical history of schizophrenia, chronic low back pain.  Patient presenting today for evaluation of weakness.  He tells me he was diagnosed with a urinary tract infection at the [facility] hospital 2 days ago.  He was told if he was not better in the next 2 days to return.  He did not feel like driving to [place], so presents here to [facility].  He denies to me he is having any fevers or chills.  He does describe some lower abdominal and back discomfort.  He denies to me he is having dysuria, but is not feeling better after taking Cipro.  Patient reports a history of spinal tumor removed surgically in 2008.  He tells me he was paralyzed for 5 years before he was able to ambulate again with a walker.  Over the past 2 days, he has developed an inability to ambulate using his walker.  He is complaining of pain in his back and legs.  The history is provided by the patient.      Past Medical History:  Diagnosis Date   Behavioral disorder    Cancer [facility])    Constipation    Gonorrhea     Patient Active Problem List   Diagnosis Date Noted   Constipation 02/02/2016   UTI (lower urinary tract infection)    Paranoid schizophrenia ([place]) 02/01/2016    Past Surgical History:  Procedure Laterality Date   BACK SURGERY         Family History  Family history unknown: Yes    Social History   Tobacco Use   Smoking status: Every Day    Packs/day: 1.00    Types: Cigarettes   Smokeless tobacco: Never  Vaping Use   Vaping Use: Never used  Substance Use Topics   Alcohol use: No   Drug use: No    Home Medications Prior to Admission medications   Medication Sig Start Date End Date Taking? Authorizing Provider   baclofen (LIORESAL) 10 MG tablet Take 1 tablet (10 mg total) by mouth 3 (three) times daily as needed for muscle spasms. 06/18/20   Jaynee Eagles, PA-C  bisacodyl (DULCOLAX) 5 MG EC tablet Take 5 mg by mouth daily as needed for moderate constipation.    [provider]  Calcium Carb-Cholecalciferol (CALCIUM-VITAMIN D) 500-400 MG-UNIT TABS Take 1 tablet by mouth daily.    [provider]  cetirizine (ZYRTEC ALLERGY) 10 MG tablet Take 1 tablet (10 mg total) by mouth daily. 07/19/20   Hazel Sams, PA-C  cyclobenzaprine (FLEXERIL) 5 MG tablet Take 1 tablet (5 mg total) by mouth at bedtime as needed for muscle spasms. Do not drink alcohol, drive, or take sleeping medication while taking this medication.  May cause drowsiness. 10/20/20   Volney American, PA-C  diazepam (VALIUM) 5 MG tablet Take 2.5 mg by mouth at bedtime as needed for anxiety. Patient not taking: Reported on 10/20/2020    [provider]  diclofenac Sodium (VOLTAREN) 1 % GEL Apply 2 g topically 4 (four) times daily. 11/20/20   Hans Eden, NP  diphenhydrAMINE (BENADRYL) 25 mg capsule Take 25 mg by mouth at bedtime as needed for itching or sleep.  [provider]  gabapentin (NEURONTIN) 300 MG capsule Take 1 capsule (300 mg total) by mouth 3 (three) times daily. Patient not taking: Reported on 10/20/2020 06/18/20   Jaynee Eagles, PA-C  haloperidol (HALDOL) 5 MG tablet Take 5 mg by mouth at bedtime.    [provider]  haloperidol decanoate (HALDOL DECANOATE) 50 MG/ML injection Inject 50 mg into the muscle every 28 (twenty-eight) days.    [provider]  lactulose (CHRONULAC) 10 GM/15ML solution Take 20 g by mouth 3 (three) times a week.    [provider]  naproxen (NAPROSYN) 500 MG tablet Take 1 tablet (500 mg total) by mouth 2 (two) times daily as needed. 11/20/20   White, Leitha Schuller, NP  omeprazole (PRILOSEC) 20 MG capsule Take 40 mg by mouth 2 (two) times daily before a  meal.    [provider]  oxybutynin (DITROPAN) 5 MG tablet Take 5 mg by mouth 3 (three) times daily as needed for bladder spasms.    [provider]  polyethylene glycol (MIRALAX / GLYCOLAX) packet Take 17 g by mouth 3 (three) times a week.    [provider]  pseudoephedrine (SUDAFED) 30 MG tablet Take 30 mg by mouth every 6 (six) hours as needed for congestion. Patient not taking: Reported on 10/20/2020    [provider]  sertraline (ZOLOFT) 50 MG tablet Take 50 mg by mouth daily.    [provider]  sulfamethoxazole-trimethoprim (BACTRIM DS) 800-160 MG tablet Take 1 tablet by mouth 2 (two) times daily. Patient not taking: Reported on 10/20/2020 02/02/16   Francine Graven, DO  VIAGRA 100 MG tablet Take 100 mg by mouth as needed for erectile dysfunction.  01/24/16   [provider]    Allergies    Penicillins  Review of Systems   Review of Systems  All other systems reviewed and are negative.  Physical Exam Updated Vital Signs BP 132/87 (BP Location: Left Wrist)   Pulse 99   Temp 97.8 F (36.6 C) (Oral)   Resp 16   Ht 6\' 1"  (1.854 m)   Wt 119.7 kg   SpO2 97%   BMI 34.83 kg/m   Physical Exam Vitals and nursing note reviewed.  Constitutional:      General: He is not in acute distress.    Appearance: He is well-developed. He is not diaphoretic.  HENT:     Head: Normocephalic and atraumatic.  Cardiovascular:     Rate and Rhythm: Normal rate and regular rhythm.     Heart sounds: No murmur heard.   No friction rub.  Pulmonary:     Effort: Pulmonary effort is normal. No respiratory distress.     Breath sounds: Normal breath sounds. No wheezing or rales.  Abdominal:     General: Bowel sounds are normal. There is no distension.     Palpations: Abdomen is soft.     Tenderness: There is no abdominal tenderness.  Musculoskeletal:        General: Normal range of motion.     Cervical back: Normal range of motion and neck supple.   Skin:    General: Skin is warm and dry.  Neurological:     Mental Status: He is alert and oriented to person, place, and time.     Coordination: Coordination normal.     Comments: Patient is a wake and alert.  Cranial nerves appear intact.  Strength is 5 out of 5 in both upper extremities.  Strength is 3+ out  of 5 in both lower extremities.  He does have 1+ pitting edema of the legs bilaterally.  He is noted to have outward deviation of both feet.  I am unable to elicit reflexes in the lower extremities.    ED Results / Procedures / Treatments   Labs (all labs ordered are listed, but only abnormal results are displayed) Labs Reviewed - No data to display  EKG None  Radiology No results found.  Procedures Procedures   Medications Ordered in ED Medications  sodium chloride 0.9 % bolus 500 mL (has no administration in time range)    ED Course  I have reviewed the triage vital signs and the nursing notes.  Pertinent labs & imaging results that were available during my care of the patient were reviewed by me and considered in my medical decision making (see chart for details).    MDM Rules/Calculators/A&P  Patient is a 65 year old male with past medical history as per HPI.  He presents today with complaints of difficulty ambulating.  He tells me he was diagnosed with a UTI by his primary doctor at the [facility] hospital 2 days ago.  He was started on Cipro and since then has developed an inability to ambulate.  This is what brings him to the ER today.  He is also complaining of pain in his back and legs.  Patient's work-up thus far unremarkable.  He has no elevation of white count and UA is not suggestive of UTI.    I am uncertain as to the etiology of his inability to ambulate, but as he is complaining of back and leg pain and has history of prior spinal tumor, I feel as though an MRI is indicated.  Pain treatment will remain in the emergency department to undergo this study.  Care will  be signed out to oncoming provider at shift change with disposition pending.  Final Clinical Impression(s) / ED Diagnoses Final diagnoses:  None    Rx / DC Orders ED Discharge Orders     None        Veryl Speak, MD 02/23/21 [id number]

## 2021-02-23 NOTE — ED Provider Notes (Signed)
Patient signed out to me by previous provider. Please refer to their note for full HPI.  Briefly this is a 65 year old male with past medical history of reported spinal tumor status postresection with some residual paralysis and chronic lower back pain.  He presented here with difficulty ambulating the last 2 days and lower back pain.  Metabolic work-up was unremarkable but MRI was recommended for her worsening weakness with ambulation.  Patient follows with a spine doctor in [place] where the majority of his [place] care is.  We do not have access to those documents. Physical Exam  BP (!) 151/98 (BP Location: Left Wrist)   Pulse 86   Temp 97.8 F (36.6 C) (Oral)   Resp 19   Ht 6\' 1"  (1.854 m)   Wt 119.7 kg   SpO2 100%   BMI 34.83 kg/m   Physical Exam Vitals and nursing note reviewed.  Constitutional:      Appearance: Normal appearance.  HENT:     Head: Normocephalic.     Mouth/Throat:     Mouth: Mucous membranes are moist.  Cardiovascular:     Rate and Rhythm: Normal rate.  Pulmonary:     Effort: Pulmonary effort is normal. No respiratory distress.  Abdominal:     Palpations: Abdomen is soft.     Tenderness: There is no abdominal tenderness.  Skin:    General: Skin is warm.  Neurological:     Mental Status: He is alert and oriented to person, place, and time. Mental status is at baseline.     Comments: Weak in the bilateral lower extremities but able to raise them off the bed, equal dorsi and plantar flexion, sensory intact, unable to do reflexes as the legs are very large  Psychiatric:        Mood and Affect: Mood normal.    ED Course/Procedures     Procedures  MDM   MRI shows L4-L5 and L5-S1 disc herniations with foraminal stenosis and spinal stenosis.  In regards to these findings patient appears neurologically intact in regards to his baseline but does endorse difficulty with walking secondary to lower back pain.  I spoke with on-call neurosurgeon who feels these  findings are stable and without any acute neurologic deficits from baseline on exam there does not appear to be a neurosurgical emergency.  Discussed with the patient physical therapy evaluation and potential placement of facility.  Patient declines this.  Patient's been able to get up and ambulate with assistance and has a wheelchair at home.  Social work is seen the patient and we will do a face-to-face order for evaluation and home PT.  Patient at this time appears safe and stable for discharge and will be treated as an outpatient.  Discharge plan and strict return to ED precautions discussed, patient verbalizes understanding and agreement.       Lorelle Gibbs, DO 02/23/21 1443

## 2021-02-23 NOTE — Discharge Instructions (Addendum)
You have been seen and discharged from the emergency department.  Your MRI showed advanced disc degeneration at L5-S1 with grade 1 anterior listhesis.  You have stenosis at L5 and S1 with overall moderate spinal stenosis.  There is also left L4 foraminal stenosis with disc herniation.  Follow-up with your primary provider and neurosurgery for reevaluation and further care. Take home medications as prescribed. If you have any worsening symptoms or further concerns for your health please return to an emergency department for further evaluation.

## 2021-02-24 LAB — URINE CULTURE: Culture: <10000 — AB

## 2021-02-27 ENCOUNTER — Emergency Department (HOSPITAL_BASED_OUTPATIENT_CLINIC_OR_DEPARTMENT_OTHER)
Admission: EM | Admit: 2021-02-27 | Discharge: 2021-02-27 | Disposition: A | Discharge status: Home / Self Care | Payer: No Typology Code available for payment source | Attending: Emergency Medicine | Admitting: Emergency Medicine

## 2021-02-27 ENCOUNTER — Encounter (HOSPITAL_BASED_OUTPATIENT_CLINIC_OR_DEPARTMENT_OTHER): Payer: Self-pay | Admitting: Obstetrics and Gynecology

## 2021-02-27 ENCOUNTER — Other Ambulatory Visit: Payer: Self-pay

## 2021-02-27 DIAGNOSIS — R062 Wheezing: Secondary | ICD-10-CM

## 2021-02-27 DIAGNOSIS — M549 Dorsalgia, unspecified: Secondary | ICD-10-CM | POA: Diagnosis present

## 2021-02-27 DIAGNOSIS — Z8589 Personal history of malignant neoplasm of other organs and systems: Secondary | ICD-10-CM | POA: Diagnosis not present

## 2021-02-27 DIAGNOSIS — M47816 Spondylosis without myelopathy or radiculopathy, lumbar region: Secondary | ICD-10-CM | POA: Insufficient documentation

## 2021-02-27 DIAGNOSIS — J441 Chronic obstructive pulmonary disease with (acute) exacerbation: Secondary | ICD-10-CM | POA: Diagnosis not present

## 2021-02-27 DIAGNOSIS — F17210 Nicotine dependence, cigarettes, uncomplicated: Secondary | ICD-10-CM | POA: Insufficient documentation

## 2021-02-27 DIAGNOSIS — M545 Low back pain, unspecified: Secondary | ICD-10-CM

## 2021-02-27 MED ORDER — CYCLOBENZAPRINE HCL 10 MG PO TABS
10.0000 mg | ORAL_TABLET | Freq: Two times a day (BID) | ORAL | 0 refills | Status: DC | PRN
Start: 2021-02-27 — End: 2023-07-02

## 2021-02-27 MED ORDER — ALBUTEROL SULFATE HFA 108 (90 BASE) MCG/ACT IN AERS
2.0000 | INHALATION_SPRAY | Freq: Once | RESPIRATORY_TRACT | Status: AC
  Administered 2021-02-27: 2 via RESPIRATORY_TRACT

## 2021-02-27 MED ORDER — IPRATROPIUM-ALBUTEROL 0.5-2.5 (3) MG/3ML IN SOLN: 3.0000 mL | Freq: Once | RESPIRATORY_TRACT | Status: DC

## 2021-02-27 MED ORDER — METHYLPREDNISOLONE 4 MG PO TBPK: ORAL_TABLET | ORAL | 0 refills | Status: DC

## 2021-02-27 MED ORDER — KETOROLAC TROMETHAMINE 30 MG/ML IJ SOLN
30.0000 mg | Freq: Once | INTRAMUSCULAR | Status: AC
  Administered 2021-02-27: 30 mg via INTRAMUSCULAR
  Filled 2021-02-27: qty 1

## 2021-02-27 MED ORDER — ALBUTEROL SULFATE HFA 108 (90 BASE) MCG/ACT IN AERS
2.0000 | INHALATION_SPRAY | Freq: Once | RESPIRATORY_TRACT | Status: AC
  Administered 2021-02-27: 2 via RESPIRATORY_TRACT
  Filled 2021-02-27: qty 6.7

## 2021-02-27 MED ORDER — IPRATROPIUM-ALBUTEROL 0.5-2.5 (3) MG/3ML IN SOLN
3.0000 mL | Freq: Once | RESPIRATORY_TRACT | Status: AC
  Administered 2021-02-27: 3 mL via RESPIRATORY_TRACT
  Filled 2021-02-27: qty 3

## 2021-02-27 MED ORDER — OXYCODONE-ACETAMINOPHEN 5-325 MG PO TABS: 1.0000 | ORAL_TABLET | Freq: Four times a day (QID) | ORAL | 0 refills | Status: DC | PRN

## 2021-02-27 MED ORDER — METHYLPREDNISOLONE SODIUM SUCC 125 MG IJ SOLR
125.0000 mg | Freq: Once | INTRAMUSCULAR | Status: AC
  Administered 2021-02-27: 125 mg via INTRAMUSCULAR
  Filled 2021-02-27: qty 2

## 2021-02-27 MED ORDER — OXYCODONE-ACETAMINOPHEN 5-325 MG PO TABS
1.0000 | ORAL_TABLET | Freq: Once | ORAL | Status: AC
Start: 2021-02-27 — End: 2021-02-27
  Administered 2021-02-27: 1 via ORAL
  Filled 2021-02-27: qty 1

## 2021-02-27 NOTE — ED Triage Notes (Signed)
Patient reports to the ER for spinal stenosis. Reports he was seen at [facility] recently and was told to follow up here. Patient states he tried to go to the [place] and was given a shot of pain medication and sent home. Patient denies having anyone to follow up with for a spinal doctor but is concerned about the increase in pain. Patient reports he is constipated but has control of bowel and bladder.

## 2021-02-27 NOTE — ED Provider Notes (Signed)
[facility] EMERGENCY DEPT Provider Note   CSN: [id number] Arrival date & time: 02/27/21  1252     History Chief Complaint  Patient presents with   Back Pain    Timothy [name] is a 65 y.o. male.  HPI     65yo male with history of smoking, prior back surgery for spinal tumor (per pt), schizophrenia, recent visits to our ED and the [facility] ED presents with concern for continuing back pain and wheezing.   Seen at [facility] ED yesterday, was given shots and felt better but was not given rx Has had weakness in legs, has been for along time.  Had surgery for spinal cord cancer and was paralyzed for 7 years at [facility], then could walk again, the for about a week he felt he couldn't walk but after yesterday he had a shot and was able to walk about 30 minutes later and now denies new difficulties walking Tylenol arthritis,  [facility], has not seen them yet Percocet has worked for pain in the past but does not have rx now  Had a little bit of wheezing yesterday, worsened today. Cough started yesterday.  Nonproductive.  No chest pain.  No fever. Smoking up until recently  Quit smoking now.   Here for concern for back pain, wheezing, worried has COPD Shortness of breath, nurse thought he had COPD Treated for arthritis at [facility], has arthritis, spasms with that  Back pain started about one week ago, had problems standing and walking, went to [facility] dr. Wilburn Mylar, had some injections and walked out after being paralyzed for over a week     Always self catheterize, uses special catheter, has neurogenic bladder chronically, goes while sleeping and wears condom catheterization    Past Medical History:  Diagnosis Date   Behavioral disorder    Cancer [facility])    Constipation    Gonorrhea     Patient Active Problem List   Diagnosis Date Noted   Constipation 02/02/2016   UTI (lower urinary tract infection)    Paranoid  schizophrenia ([place]) 02/01/2016    Past Surgical History:  Procedure Laterality Date   BACK SURGERY         Family History  Family history unknown: Yes    Social History   Tobacco Use   Smoking status: Every Day    Packs/day: 1.00    Types: Cigarettes   Smokeless tobacco: Never  Vaping Use   Vaping Use: Never used  Substance Use Topics   Alcohol use: No   Drug use: No    Home Medications Prior to Admission medications   Medication Sig Start Date End Date Taking? Authorizing Provider  cyclobenzaprine (FLEXERIL) 10 MG tablet Take 1 tablet (10 mg total) by mouth 2 (two) times daily as needed for muscle spasms. 02/27/21  Yes Gareth Morgan, MD  methylPREDNISolone (MEDROL DOSEPAK) 4 MG TBPK tablet See instructions 02/27/21  Yes Gareth Morgan, MD  oxyCODONE-acetaminophen (PERCOCET/ROXICET) 5-325 MG tablet Take 1 tablet by mouth every 6 (six) hours as needed for severe pain. 02/27/21  Yes Gareth Morgan, MD  bisacodyl (DULCOLAX) 5 MG EC tablet Take 5 mg by mouth daily as needed for moderate constipation.    [provider]  Calcium Carb-Cholecalciferol (CALCIUM-VITAMIN D) 500-400 MG-UNIT TABS Take 1 tablet by mouth daily.    [provider]  cetirizine (ZYRTEC ALLERGY) 10 MG tablet Take 1 tablet (10 mg total) by mouth  daily. 07/19/20   Hazel Sams, PA-C  diazepam (VALIUM) 5 MG tablet Take 2.5 mg by mouth at bedtime as needed for anxiety. Patient not taking: Reported on 10/20/2020    [provider]  diclofenac Sodium (VOLTAREN) 1 % GEL Apply 2 g topically 4 (four) times daily. 11/20/20   Hans Eden, NP  diphenhydrAMINE (BENADRYL) 25 mg capsule Take 25 mg by mouth at bedtime as needed for itching or sleep.    [provider]  gabapentin (NEURONTIN) 300 MG capsule Take 1 capsule (300 mg total) by mouth 3 (three) times daily. Patient not taking: Reported on 10/20/2020 06/18/20   Jaynee Eagles, PA-C  haloperidol (HALDOL) 5 MG tablet Take 5 mg  by mouth at bedtime.    [provider]  haloperidol decanoate (HALDOL DECANOATE) 50 MG/ML injection Inject 50 mg into the muscle every 28 (twenty-eight) days.    [provider]  lactulose (CHRONULAC) 10 GM/15ML solution Take 20 g by mouth 3 (three) times a week.    [provider]  naproxen (NAPROSYN) 500 MG tablet Take 1 tablet (500 mg total) by mouth 2 (two) times daily as needed. 02/23/21   Horton, Drue Dun M, DO  omeprazole (PRILOSEC) 20 MG capsule Take 40 mg by mouth 2 (two) times daily before a meal.    [provider]  oxybutynin (DITROPAN) 5 MG tablet Take 5 mg by mouth 3 (three) times daily as needed for bladder spasms.    [provider]  polyethylene glycol (MIRALAX / GLYCOLAX) packet Take 17 g by mouth 3 (three) times a week.    [provider]  pseudoephedrine (SUDAFED) 30 MG tablet Take 30 mg by mouth every 6 (six) hours as needed for congestion. Patient not taking: Reported on 10/20/2020    [provider]  sertraline (ZOLOFT) 50 MG tablet Take 50 mg by mouth daily.    [provider]  sulfamethoxazole-trimethoprim (BACTRIM DS) 800-160 MG tablet Take 1 tablet by mouth 2 (two) times daily. Patient not taking: Reported on 10/20/2020 02/02/16   Francine Graven, DO  VIAGRA 100 MG tablet Take 100 mg by mouth as needed for erectile dysfunction.  01/24/16   [provider]    Allergies    Penicillins  Review of Systems   Review of Systems  Constitutional:  Positive for fatigue. Negative for fever.  HENT:  Negative for sore throat.   Eyes:  Negative for visual disturbance.  Respiratory:  Positive for cough and shortness of breath.   Cardiovascular:  Negative for chest pain.  Gastrointestinal:  Negative for abdominal pain, nausea and vomiting.  Genitourinary:  Positive for difficulty urinating (self cath).  Musculoskeletal:  Positive for back pain. Negative for neck stiffness.  Skin:  Negative for rash.   Neurological:  Negative for syncope, weakness, numbness and headaches.   Physical Exam Updated Vital Signs BP (!) 142/83 (BP Location: Right Arm)   Pulse (!) 115   Temp 98.7 F (37.1 C) (Oral)   Resp 16   SpO2 92%   Physical Exam Vitals and nursing note reviewed.  Constitutional:      General: He is not in acute distress.    Appearance: He is well-developed. He is not diaphoretic.  HENT:     Head: Normocephalic and atraumatic.  Eyes:     Conjunctiva/sclera: Conjunctivae normal.  Cardiovascular:     Rate and Rhythm: Normal rate and regular rhythm.     Heart sounds: Normal heart sounds. No murmur heard.  No friction rub. No gallop.  Pulmonary:     Effort: Pulmonary effort is normal. No respiratory distress.     Breath sounds: Wheezing present. No rales.  Abdominal:     General: There is no distension.     Palpations: Abdomen is soft.     Tenderness: There is no abdominal tenderness. There is no guarding.  Musculoskeletal:        General: Tenderness (lower back) present.     Cervical back: Normal range of motion.  Skin:    General: Skin is warm and dry.  Neurological:     Mental Status: He is alert and oriented to person, place, and time.     Comments: Normal strength bilateral LE Reports altered sensation bottom of feet bilaterally    ED Results / Procedures / Treatments   Labs (all labs ordered are listed, but only abnormal results are displayed) Labs Reviewed - No data to display  EKG None  Radiology No results found.  Procedures Procedures   Medications Ordered in ED Medications  methylPREDNISolone sodium succinate (SOLU-MEDROL) 125 mg/2 mL injection 125 mg (125 mg Intramuscular Given 02/27/21 1502)  ipratropium-albuterol (DUONEB) 0.5-2.5 (3) MG/3ML nebulizer solution 3 mL (3 mLs Nebulization Given 02/27/21 1451)  oxyCODONE-acetaminophen (PERCOCET/ROXICET) 5-325 MG per tablet 1 tablet (1 tablet Oral Given 02/27/21 1502)  ketorolac (TORADOL) 30 MG/ML  injection 30 mg (30 mg Intramuscular Given 02/27/21 1501)  albuterol (VENTOLIN HFA) 108 (90 Base) MCG/ACT inhaler 2 puff (2 puffs Inhalation Given 02/27/21 1451)  albuterol (VENTOLIN HFA) 108 (90 Base) MCG/ACT inhaler 2 puff (2 puffs Inhalation Given 02/27/21 1546)    ED Course  I have reviewed the triage vital signs and the nursing notes.  Pertinent labs & imaging results that were available during my care of the patient were reviewed by me and considered in my medical decision making (see chart for details).    MDM Rules/Calculators/A&P                            65yo male with history of smoking, prior back surgery for spinal tumor (per pt), schizophrenia, recent visits to our ED and the [facility] ED presents with concern for continuing back pain and wheezing.   Patient has a normal neurologic exam and denies any new urinary retention or overflow incontinence, stool incontinence, saddle anesthesia, fever, IV drug use, trauma, chronic steroid use or immunocompromise and have low suspicion suspicion for cauda equina, fracture, epidural abscess, or vertebral osteomyelitis--had MRI just days ago which showed severe facet arthropathy, advanced disc degeneration, spinal stenosis.  Recommend outpatient follow up with spine physician and pain doctor regarding this. Reviewed in [place] drug database and wrote rx for percocet after discussing risks and benefits. Given medrol dosepack.    Regarding wheezing/dyspnea.  Exam and history consistent with suspected COPD given wheezing and smoking history.  Reviewed recent labwork and  CXR from 10/8.  No significant anemia, electrolyte abnormalities, troponin 20s and stable, BNP normal, no sign of CHF/ACS/pneumonia/pneumothorax. No asymmetric leg swelling, wheezing most consistent with suspected COPD. Given solumedrol, duonebs and albuterol inhaler to use at home. Is improved after treatments, has mild persistent wheeze, however no hypoxia, normal RR, and reports feeling  better.  Discharging with medrol dosepack to help with both back pain and suspected COPD. Recommend PCP follow up.    Final Clinical Impression(s) / ED Diagnoses Final diagnoses:  COPD exacerbation ([place])  Facet arthritis of lumbar region  Acute  bilateral low back pain without sciatica  Wheezing    Rx / DC Orders ED Discharge Orders          Ordered    oxyCODONE-acetaminophen (PERCOCET/ROXICET) 5-325 MG tablet  Every 6 hours PRN        02/27/21 1519    methylPREDNISolone (MEDROL DOSEPAK) 4 MG TBPK tablet        02/27/21 1519    cyclobenzaprine (FLEXERIL) 10 MG tablet  2 times daily PRN        02/27/21 1519             Gareth Morgan, MD 02/28/21 0725

## 2021-02-28 ENCOUNTER — Emergency Department (HOSPITAL_COMMUNITY)
Admission: EM | Admit: 2021-02-28 | Discharge: 2021-03-01 | Disposition: A | Discharge status: Home / Self Care | Payer: No Typology Code available for payment source | Attending: Emergency Medicine | Admitting: Emergency Medicine

## 2021-02-28 ENCOUNTER — Encounter (HOSPITAL_COMMUNITY): Payer: Self-pay | Admitting: *Deleted

## 2021-02-28 ENCOUNTER — Ambulatory Visit (INDEPENDENT_AMBULATORY_CARE_PROVIDER_SITE_OTHER): Payer: Medicare Other

## 2021-02-28 ENCOUNTER — Ambulatory Visit (HOSPITAL_COMMUNITY)
Admission: RE | Admit: 2021-02-28 | Discharge: 2021-02-28 | Disposition: A | Discharge status: Home / Self Care | Payer: Medicare Other | Source: Ambulatory Visit | Attending: Emergency Medicine | Admitting: Emergency Medicine

## 2021-02-28 ENCOUNTER — Encounter (HOSPITAL_COMMUNITY): Payer: Self-pay

## 2021-02-28 VITALS — BP 132/74 | HR 102 | Temp 99.4°F

## 2021-02-28 DIAGNOSIS — Z20822 Contact with and (suspected) exposure to covid-19: Secondary | ICD-10-CM | POA: Insufficient documentation

## 2021-02-28 DIAGNOSIS — J441 Chronic obstructive pulmonary disease with (acute) exacerbation: Secondary | ICD-10-CM | POA: Diagnosis not present

## 2021-02-28 DIAGNOSIS — J189 Pneumonia, unspecified organism: Secondary | ICD-10-CM | POA: Diagnosis not present

## 2021-02-28 DIAGNOSIS — Z8589 Personal history of malignant neoplasm of other organs and systems: Secondary | ICD-10-CM | POA: Diagnosis not present

## 2021-02-28 DIAGNOSIS — R059 Cough, unspecified: Secondary | ICD-10-CM | POA: Diagnosis present

## 2021-02-28 DIAGNOSIS — F17210 Nicotine dependence, cigarettes, uncomplicated: Secondary | ICD-10-CM | POA: Insufficient documentation

## 2021-02-28 DIAGNOSIS — R0600 Dyspnea, unspecified: Secondary | ICD-10-CM

## 2021-02-28 DIAGNOSIS — R051 Acute cough: Secondary | ICD-10-CM

## 2021-02-28 LAB — BASIC METABOLIC PANEL
Anion gap: 12 (ref 5–15)
BUN: 12 mg/dL (ref 8–23)
CO2: 24 mmol/L (ref 22–32)
Calcium: 9.2 mg/dL (ref 8.9–10.3)
Chloride: 98 mmol/L (ref 98–111)
Creatinine, Ser: 0.88 mg/dL (ref 0.61–1.24)
GFR, Estimated: >60 mL/min (ref >60)
Glucose, Bld: 131 mg/dL — ABNORMAL HIGH (ref 70–99)
Potassium: 4.2 mmol/L (ref 3.5–5.1)
Sodium: 134 mmol/L — ABNORMAL LOW (ref 135–145)

## 2021-02-28 LAB — CBC WITH DIFFERENTIAL/PLATELET
Abs Immature Granulocytes: 0.08 10*3/uL — ABNORMAL HIGH (ref 0.00–0.07)
Basophils Absolute: 0 10*3/uL (ref 0.0–0.1)
Basophils Relative: 0 %
Eosinophils Absolute: 0 10*3/uL (ref 0.0–0.5)
Eosinophils Relative: 0 %
HCT: 36.1 % — ABNORMAL LOW (ref 39.0–52.0)
Hemoglobin: 12.3 g/dL — ABNORMAL LOW (ref 13.0–17.0)
Immature Granulocytes: 1 %
Lymphocytes Relative: 5 %
Lymphs Abs: 0.4 10*3/uL — ABNORMAL LOW (ref 0.7–4.0)
MCH: 31.2 pg (ref 26.0–34.0)
MCHC: 34.1 g/dL (ref 30.0–36.0)
MCV: 91.6 fL (ref 80.0–100.0)
Monocytes Absolute: 0.4 10*3/uL (ref 0.1–1.0)
Monocytes Relative: 5 %
Neutro Abs: 7.2 10*3/uL (ref 1.7–7.7)
Neutrophils Relative %: 89 %
Platelets: 202 10*3/uL (ref 150–400)
RBC: 3.94 MIL/uL — ABNORMAL LOW (ref 4.22–5.81)
RDW: 13 % (ref 11.5–15.5)
WBC: 8.1 10*3/uL (ref 4.0–10.5)
nRBC: 0 % (ref 0.0–0.2)

## 2021-02-28 LAB — RESP PANEL BY RT-PCR (FLU A&B, COVID) ARPGX2
Influenza A by PCR: NEGATIVE
Influenza B by PCR: NEGATIVE
SARS Coronavirus 2 by RT PCR: NEGATIVE

## 2021-02-28 IMAGING — DX DG CHEST 2V
2 series · 2 of 2 positions shown · non-contrast
Comparison: [DATE]

CLINICAL DATA: Dyspnea, hypoxia

EXAM:
CHEST - 2 VIEW

[chest pa]
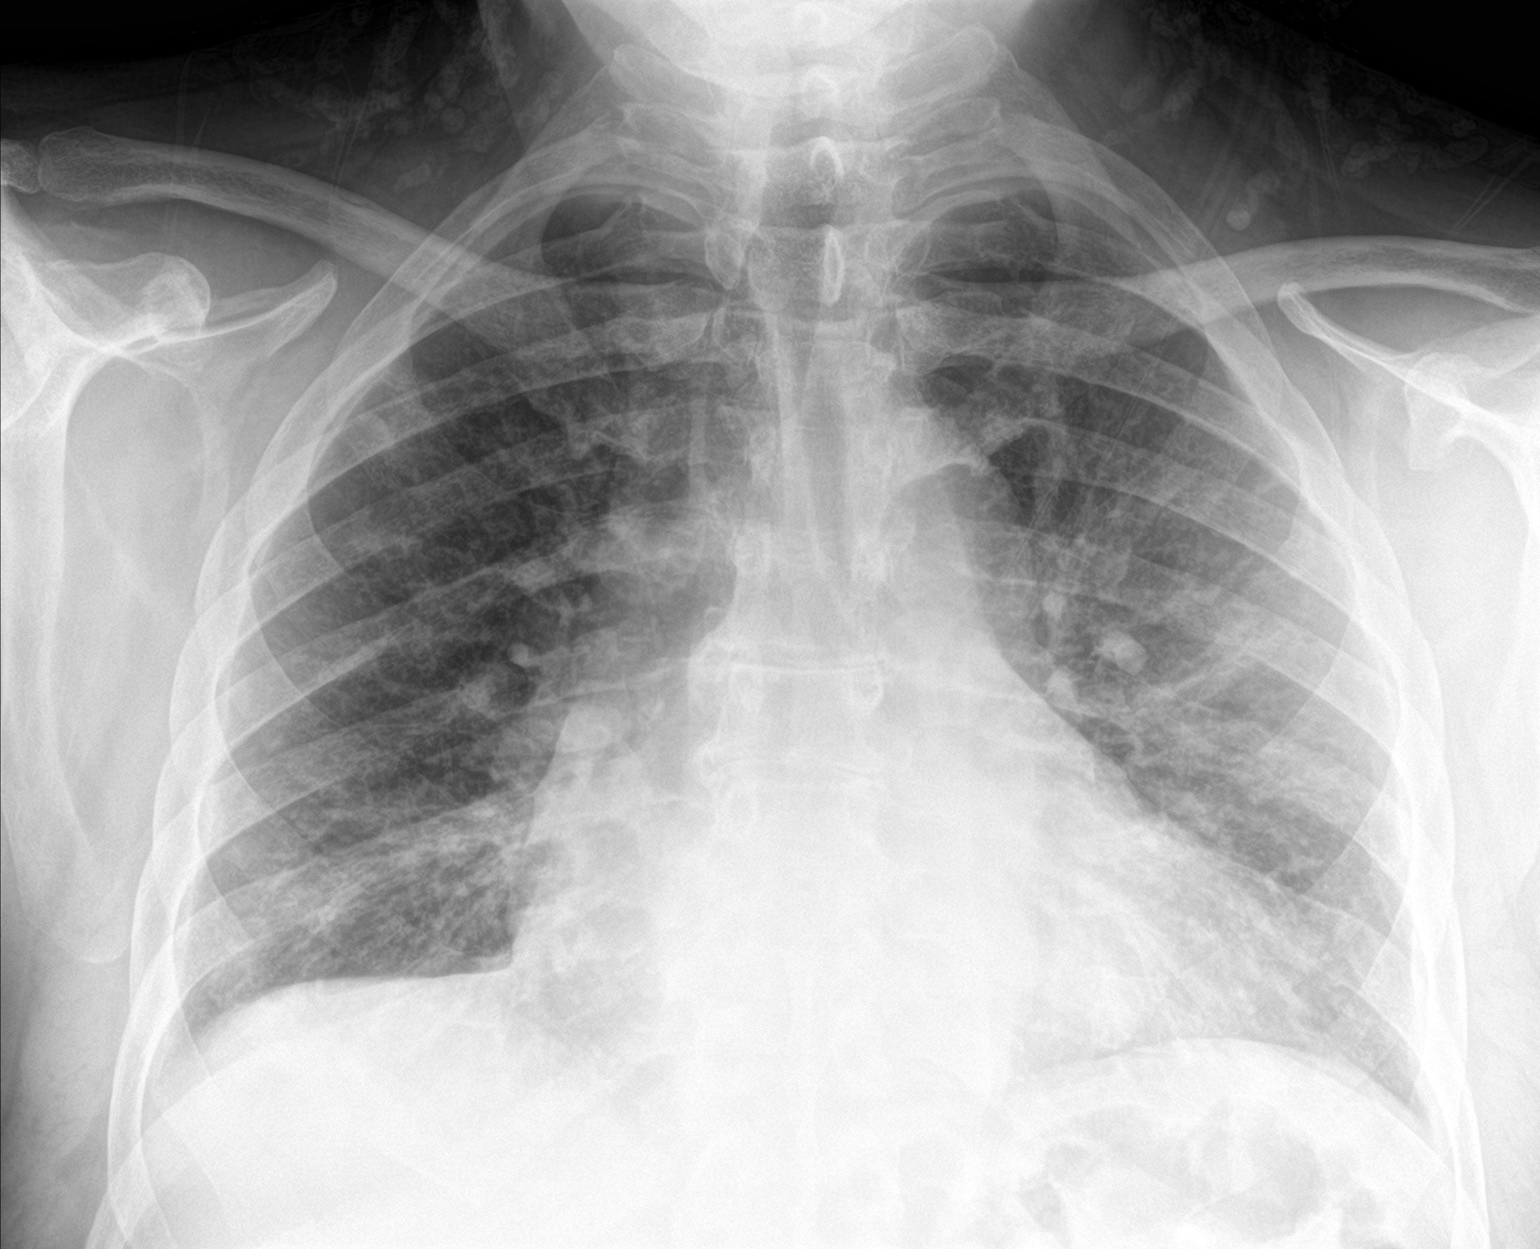

[chest lat]
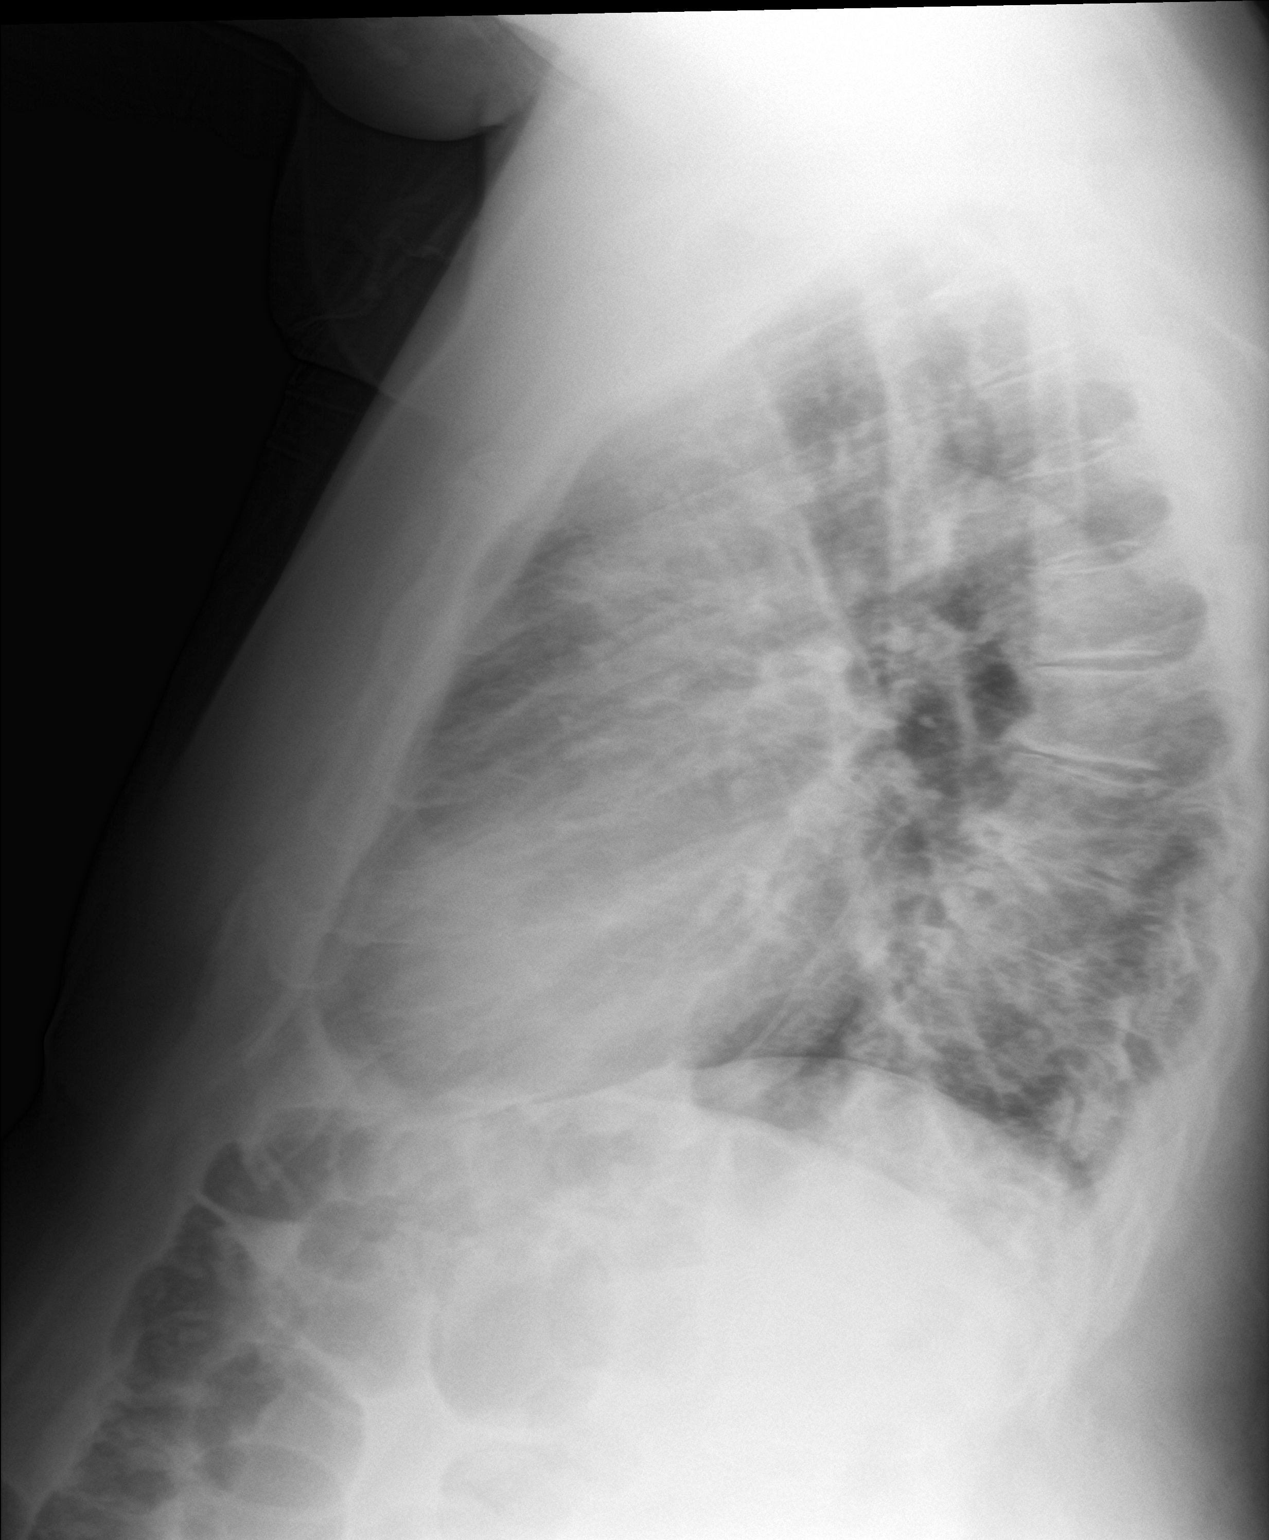

[2 of 2 positions shown; findings below may reference images not displayed]

FINDINGS: The lungs are symmetrically well inflated. Since the prior
examination, there has developed fine basilar predominant
interstitial pulmonary infiltrate most suggestive of interstitial
pulmonary edema, though acute infection could appear similarly. No
pneumothorax or pleural effusion. Cardiac size is at the upper
limits of normal. No acute bone abnormality.
IMPRESSION: Interval development of mild interstitial pulmonary infiltrate,
edema versus infection.

## 2021-02-28 NOTE — ED Provider Notes (Signed)
Emergency Medicine Provider Triage Evaluation Note  Timothy [name] , a 65 y.o. male  was evaluated in triage.  Pt complains of shortness of breath over the last few days.  He was seen and evaluated at urgent care earlier today after not responding to outpatient steroids given from another emergency room provider the day before.  Chest x-ray showed possible multifocal pneumonia he was sent to the emergency department for further evaluation.  He reports associated productive cough, chest heaviness, and chills.  He denies fever, loss of consciousness.  Review of Systems  Positive:  Negative: See above   Physical Exam  BP (!) 138/96 (BP Location: Right Arm)   Pulse 89   Temp 99.1 F (37.3 C) (Oral)   Resp 20   SpO2 99%  Gen:   Awake, no distress   Resp:  Normal effort, diffuse rhonchi, coughing on exam  MSK:   Moves extremities without difficulty  Other:    Medical Decision Making  Medically screening exam initiated at 9:50 PM.  Appropriate orders placed.  Timothy [name] was informed that the remainder of the evaluation will be completed by another provider, this initial triage assessment does not replace that evaluation, and the importance of remaining in the ED until their evaluation is complete.     Timothy Limes, PA-C 02/28/21 2153    Timothy Belling, MD 02/28/21 2230

## 2021-02-28 NOTE — ED Notes (Signed)
Patient is being discharged from the Urgent Care and sent to the Emergency Department via POV . Per Kara Mead MD, patient is in need of higher level of care due to Hypoxia with chest xray changes. Patient is aware and verbalizes understanding of plan of care.  Vitals:   02/28/21 1916  BP: 132/74  Pulse: (!) 102  Temp: 99.4 F (37.4 C)  SpO2: 93%

## 2021-02-28 NOTE — ED Triage Notes (Signed)
Patient here for an appt. Patient SOB started before presented to ED. Was given Inhaler in ED.

## 2021-02-28 NOTE — ED Triage Notes (Signed)
Pt reports feeling bad x 3 days with sob, cough, fatigue. Denies fever. Sent here to r/o covid/pneumonia. No resp distress is noted at triage.

## 2021-02-28 NOTE — Discharge Instructions (Addendum)
Go to the emergency department at [facility] right now.  drive your car to the front, put on the flashers and get out.  They should be able to park the car for you.  I am concerned that you have pneumonia, most specifically COVID-pneumonia, and that you are not responding to outpatient treatment.  Let them know if your shortness of breath gets worse.

## 2021-02-28 NOTE — ED Provider Notes (Signed)
HPI  SUBJECTIVE:  Timothy [name] is a 65 y.o. male who presents with 3 days of shortness of breath, wheezing.  Reports nasal congestion and chest congestion for the past 2 days.  No fevers, body aches, headaches, loss of sense of smell or taste, sore throat, chest pain, nausea, diarrhea, abdominal pain.  He reports some vomiting. He was seen in the ED yesterday on 10/12 for wheezing, cough, shortness of breath, and back pain.  His chest x-ray and labs from 10/8 were reviewed, labs, chest x-ray was normal at that time, so it was not repeated.  He was thought to have COPD, given Solu-Medrol, duo nebs and albuterol inhaler, Medrol Dosepak to use at home.  Patient states that he has been taking 3 puffs of his albuterol inhaler using a spacer every 3-4 hours and is taking the Medrol Dosepak without improvement in his symptoms.  Symptoms are worse with exertion.  No known COVID exposure.  He got 2 doses of the COVID booster.  He has a past medical history of schizophrenia, spinal cord cancer status post back surgery with a neurogenic bladder, he smoked 2 packs a day for many years, quit yesterday.  He has has a history of hypertension and prostate cancer.  No history of diabetes.  PMD: [facility].  Past Medical History:  Diagnosis Date   Behavioral disorder    Cancer ([place])    Constipation    Gonorrhea     Past Surgical History:  Procedure Laterality Date   BACK SURGERY      Family History  Family history unknown: Yes    Social History   Tobacco Use   Smoking status: Every Day    Packs/day: 1.00    Types: Cigarettes   Smokeless tobacco: Never  Vaping Use   Vaping Use: Never used  Substance Use Topics   Alcohol use: No   Drug use: No    No current facility-administered medications for this encounter.  Current Outpatient Medications:    bisacodyl (DULCOLAX) 5 MG EC tablet, Take 5 mg by mouth daily as needed for moderate constipation., Disp: , Rfl:    Calcium Carb-Cholecalciferol  (CALCIUM-VITAMIN D) 500-400 MG-UNIT TABS, Take 1 tablet by mouth daily., Disp: , Rfl:    cetirizine (ZYRTEC ALLERGY) 10 MG tablet, Take 1 tablet (10 mg total) by mouth daily., Disp: 30 tablet, Rfl: 2   cyclobenzaprine (FLEXERIL) 10 MG tablet, Take 1 tablet (10 mg total) by mouth 2 (two) times daily as needed for muscle spasms., Disp: 20 tablet, Rfl: 0   diazepam (VALIUM) 5 MG tablet, Take 2.5 mg by mouth at bedtime as needed for anxiety. (Patient not taking: Reported on 10/20/2020), Disp: , Rfl:    diclofenac Sodium (VOLTAREN) 1 % GEL, Apply 2 g topically 4 (four) times daily., Disp: 50 g, Rfl: 0   diphenhydrAMINE (BENADRYL) 25 mg capsule, Take 25 mg by mouth at bedtime as needed for itching or sleep., Disp: , Rfl:    gabapentin (NEURONTIN) 300 MG capsule, Take 1 capsule (300 mg total) by mouth 3 (three) times daily. (Patient not taking: Reported on 10/20/2020), Disp: 60 capsule, Rfl: 1   haloperidol (HALDOL) 5 MG tablet, Take 5 mg by mouth at bedtime., Disp: , Rfl:    haloperidol decanoate (HALDOL DECANOATE) 50 MG/ML injection, Inject 50 mg into the muscle every 28 (twenty-eight) days., Disp: , Rfl:    lactulose (CHRONULAC) 10 GM/15ML solution, Take 20 g by mouth 3 (three) times a week., Disp: ,  Rfl:    methylPREDNISolone (MEDROL DOSEPAK) 4 MG TBPK tablet, See instructions, Disp: 21 each, Rfl: 0   naproxen (NAPROSYN) 500 MG tablet, Take 1 tablet (500 mg total) by mouth 2 (two) times daily as needed., Disp: 30 tablet, Rfl: 0   omeprazole (PRILOSEC) 20 MG capsule, Take 40 mg by mouth 2 (two) times daily before a meal., Disp: , Rfl:    oxybutynin (DITROPAN) 5 MG tablet, Take 5 mg by mouth 3 (three) times daily as needed for bladder spasms., Disp: , Rfl:    oxyCODONE-acetaminophen (PERCOCET/ROXICET) 5-325 MG tablet, Take 1 tablet by mouth every 6 (six) hours as needed for severe pain., Disp: 12 tablet, Rfl: 0   polyethylene glycol (MIRALAX / GLYCOLAX) packet, Take 17 g by mouth 3 (three) times a week., Disp:  , Rfl:    pseudoephedrine (SUDAFED) 30 MG tablet, Take 30 mg by mouth every 6 (six) hours as needed for congestion. (Patient not taking: Reported on 10/20/2020), Disp: , Rfl:    sertraline (ZOLOFT) 50 MG tablet, Take 50 mg by mouth daily., Disp: , Rfl:    sulfamethoxazole-trimethoprim (BACTRIM DS) 800-160 MG tablet, Take 1 tablet by mouth 2 (two) times daily. (Patient not taking: Reported on 10/20/2020), Disp: 14 tablet, Rfl: 0   VIAGRA 100 MG tablet, Take 100 mg by mouth as needed for erectile dysfunction. , Disp: , Rfl: 1  Allergies  Allergen Reactions   Penicillins Anaphylaxis    Has patient had a PCN reaction causing immediate rash, facial/tongue/throat swelling, SOB or lightheadedness with hypotension: Yes Has patient had a PCN reaction causing severe rash involving mucus membranes or skin necrosis: No Has patient had a PCN reaction that required hospitalization: Yes Has patient had a PCN reaction occurring within the last 10 years: No If all of the above answers are "NO", then may proceed with Cephalosporin use.      ROS  As noted in HPI.   Physical Exam  BP 132/74 (BP Location: Right Arm)   Pulse (!) 102   Temp 99.4 F (37.4 C) (Oral)   SpO2 93%   Constitutional: Well developed, well nourished, appears anxious Eyes:  EOMI, conjunctiva normal bilaterally HENT: Normocephalic, atraumatic,mucus membranes moist Respiratory: Normal inspiratory effort, poor air movement, diffuse rales and rhonchi throughout all lung fields. Cardiovascular: Regular tachycardia, no murmurs rubs or gallop GI: nondistended skin: No rash, skin intact Musculoskeletal: no deformities Neurologic: Alert & oriented x 3, no focal neuro deficits Psychiatric: Speech and behavior appropriate   ED Course   Medications - No data to display  Orders Placed This Encounter  Procedures   DG Chest 2 View    Standing Status:   Standing    Number of Occurrences:   1    Order Specific Question:   Reason for  Exam (SYMPTOM  OR DIAGNOSIS REQUIRED)    Answer:   SAOB, hypoxia 93%    No results found for this or any previous visit (from the past 24 hour(s)). DG Chest 2 View  Result Date: 02/28/2021 CLINICAL DATA:  Dyspnea, hypoxia EXAM: CHEST - 2 VIEW COMPARISON:  02/23/2021 FINDINGS: The lungs are symmetrically well inflated. Since the prior examination, there has developed fine basilar predominant interstitial pulmonary infiltrate most suggestive of interstitial pulmonary edema, though acute infection could appear similarly. No pneumothorax or pleural effusion. Cardiac size is at the upper limits of normal. No acute bone abnormality. IMPRESSION: Interval development of mild interstitial pulmonary infiltrate, edema versus infection. Electronically Signed   By: Cassandria Anger  Christa See M.D.   On: 02/28/2021 19:54    ED Clinical Impression  1. COPD exacerbation ([place])   2. Multifocal pneumonia      ED Assessment/Plan  Outside records reviewed.  As noted in HPI.  Reviewed imaging independently.  Chest x-ray with interval development of bilateral pulmonary infiltrates.  Edema versus infection.  See radiology report for full details.  O2 sat on 10/8 100%.  Today/yesterday it is 92 to 93% in triage, he has been tachycardic on both visits.  He is satting somewhere between 87 and 89% on room air on my exam.  Heart rate is in the 90s to low 100s.  Chest x-ray shows interval development of bilateral pulmonary infiltrates, edema versus infection.    Patient is speaking in full sentences, however, he is short of breath with talking.  I am concerned that he has a multifocal pneumonia, specifically COVID-pneumonia along with COPD exacerbation that is not responding to outpatient treatment.  I am concerned that he may need admission.  Patient states that he can go to the emergency department immediately via private vehicle.  He does not appear to be in imminent respiratory failure.  We discussed going by EMS, but feel that it  would be faster for him to go by private vehicle.  Patient states that he can make it down there.  Discussed  imaging, rationale for transfer to the emergency department with patient.  He agrees with plan  No orders of the defined types were placed in this encounter.     *This clinic note was created using Dragon dictation software. Therefore, there may be occasional mistakes despite careful proofreading.  ?    Melynda Ripple, MD 02/28/21 2013

## 2021-03-01 ENCOUNTER — Emergency Department (HOSPITAL_COMMUNITY): Payer: No Typology Code available for payment source

## 2021-03-01 IMAGING — CR DG CHEST 2V
2 series · 2 of 2 positions shown · non-contrast
Comparison: [DATE]

CLINICAL DATA: Cough and shortness of breath.

EXAM:
CHEST - 2 VIEW

[chest lat]
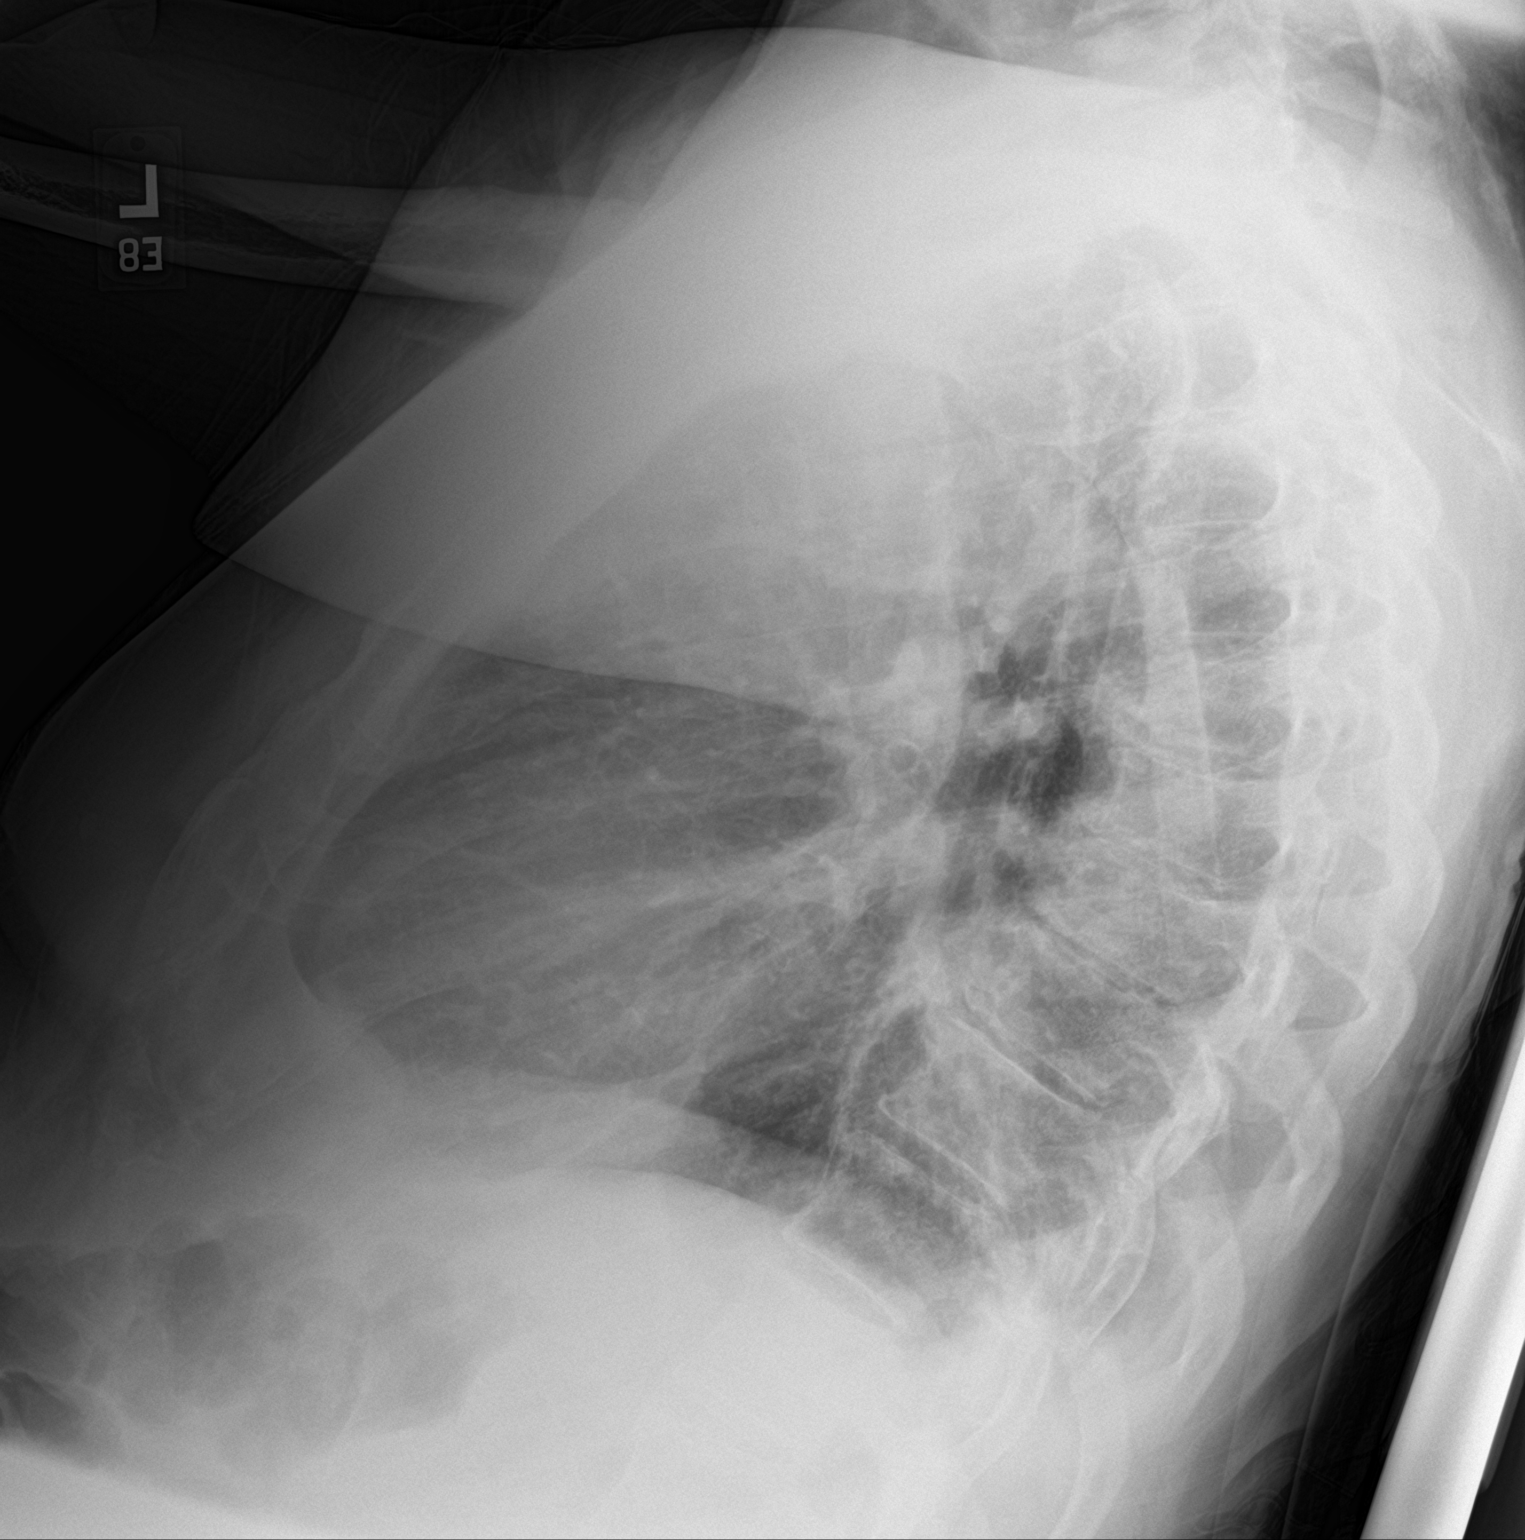

[chest ap]
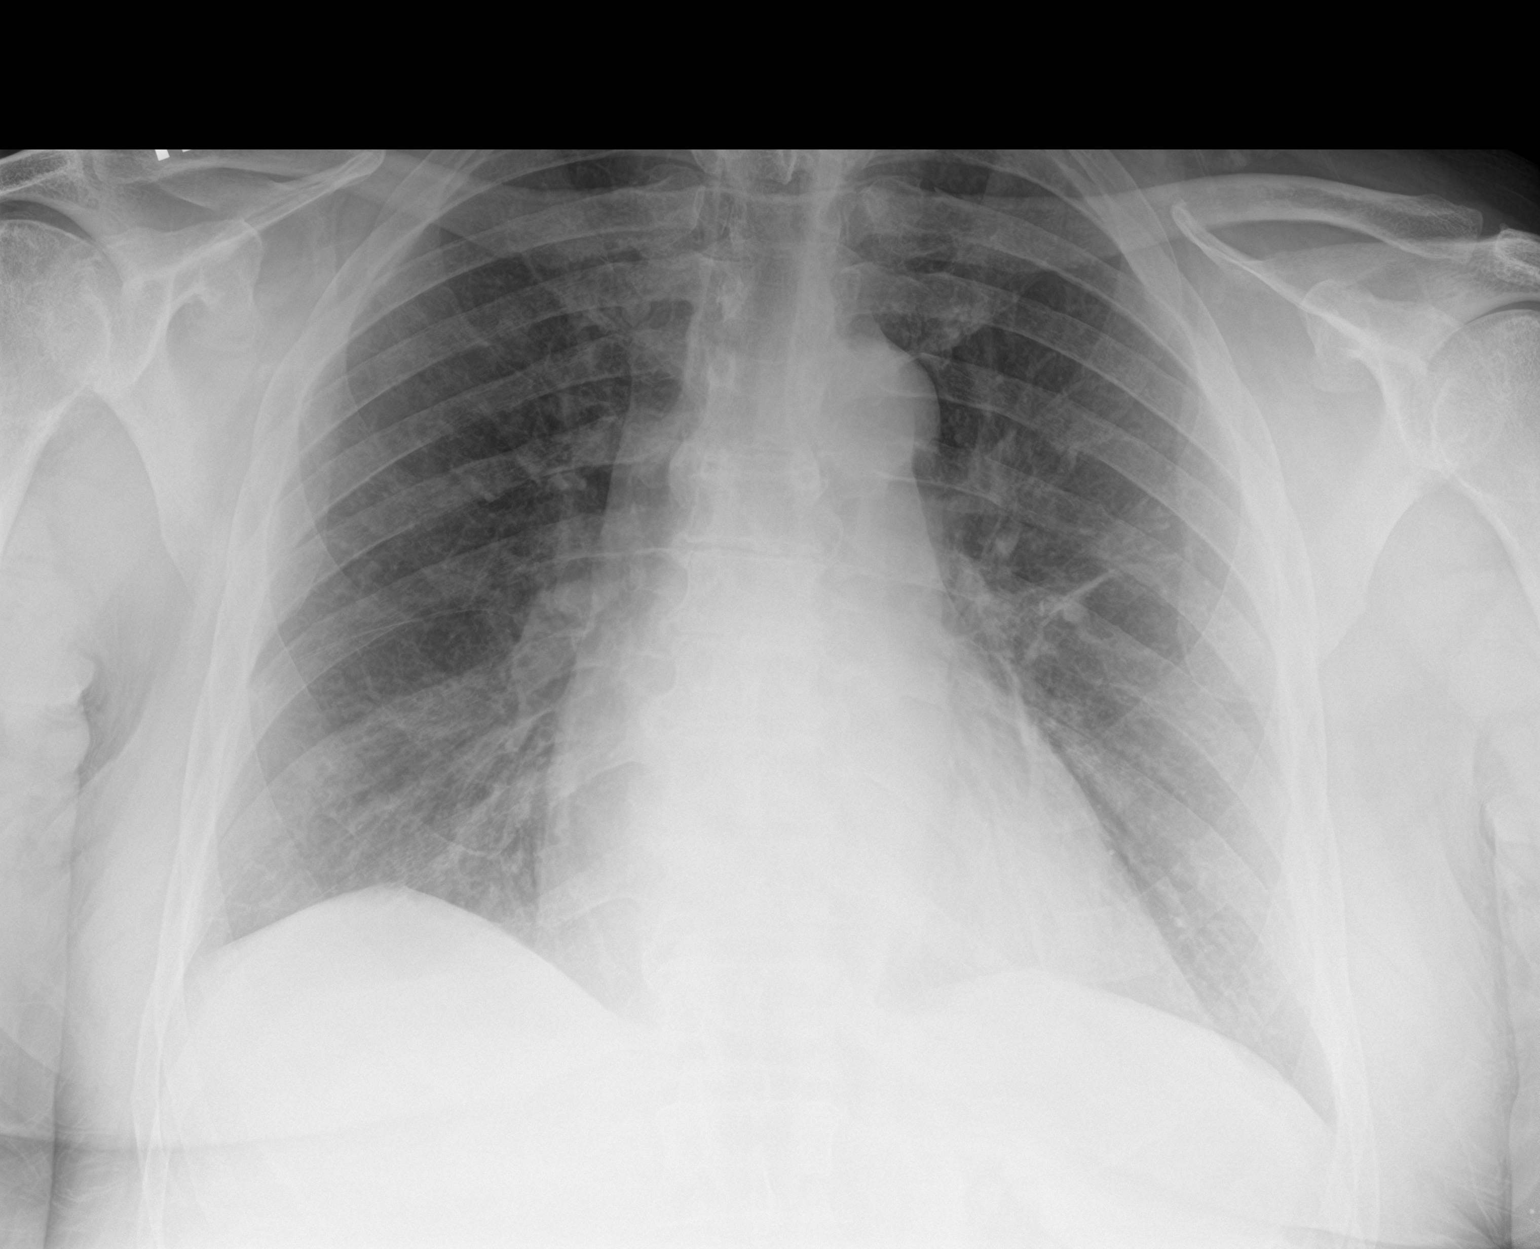

[2 of 2 positions shown; findings below may reference images not displayed]

FINDINGS: The cardiac silhouette remains upper limits of normal in size. Mild
interstitial densities are present in the lungs bilaterally,
improved from prior. There may be trace bilateral pleural effusions.
No pneumothorax is identified. No acute osseous abnormality is seen.
IMPRESSION: Improvement of mild bilateral interstitial densities which may
reflect mild edema or infection.

## 2021-03-01 MED ORDER — AEROCHAMBER PLUS FLO-VU LARGE MISC
1.0000 | Freq: Once | Status: AC
  Administered 2021-03-01: 1

## 2021-03-01 MED ORDER — ALBUTEROL SULFATE HFA 108 (90 BASE) MCG/ACT IN AERS
2.0000 | INHALATION_SPRAY | Freq: Once | RESPIRATORY_TRACT | Status: AC
  Administered 2021-03-01: 2 via RESPIRATORY_TRACT
  Filled 2021-03-01: qty 6.7

## 2021-03-01 MED ORDER — PREDNISONE 10 MG PO TABS: 30.0000 mg | ORAL_TABLET | Freq: Every day | ORAL | 0 refills | Status: DC

## 2021-03-01 MED ORDER — PROMETHAZINE-DM 6.25-15 MG/5ML PO SYRP: 5.0000 mL | ORAL_SOLUTION | Freq: Four times a day (QID) | ORAL | 0 refills | Status: DC | PRN

## 2021-03-01 MED ORDER — BENZONATATE 100 MG PO CAPS: 100.0000 mg | ORAL_CAPSULE | Freq: Three times a day (TID) | ORAL | 0 refills | Status: DC

## 2021-03-01 MED ORDER — AZITHROMYCIN 250 MG PO TABS: 250.0000 mg | ORAL_TABLET | Freq: Every day | ORAL | 0 refills | Status: DC

## 2021-03-01 NOTE — ED Provider Notes (Signed)
[facility] EMERGENCY DEPARTMENT Provider Note   CSN: [id number] Arrival date & time: 02/28/21  2032     History Chief Complaint  Patient presents with   Cough   Shortness of Breath    Timothy [name] is a 66 y.o. male.  HPI Patient is a 65 year old male with a past medical history significant for schizophrenia, history of COPD although does not seem that he has had formal testing/PFTs.  He states that he stopped smoking over the past week.  Seems to have been coughing over the past 3 days.  Was originally not productive but now productive of some whitish sputum.  He states that he has been taking his medications as prescribed.  Does have some chronic pain issues but is prescribed medicine for pain.  Also states that he is an issue of spinal cord tumor states that he is cared for by [facility] for his medical problems.  Denies any new weakness or numbness no injuries or chest pain.  Denies fever. Was seen 2 days ago at ER and prescribed methylprednisolone  He was prescribed cough medicine and steroids 2 days ago but states he has not taken any of these medications.     Past Medical History:  Diagnosis Date   Behavioral disorder    Cancer [facility]    Constipation    Gonorrhea     Patient Active Problem List   Diagnosis Date Noted   Constipation 02/02/2016   UTI (lower urinary tract infection)    Paranoid schizophrenia ([place]) 02/01/2016    Past Surgical History:  Procedure Laterality Date   BACK SURGERY         Family History  Family history unknown: Yes    Social History   Tobacco Use   Smoking status: Every Day    Packs/day: 1.00    Types: Cigarettes   Smokeless tobacco: Never  Vaping Use   Vaping Use: Never used  Substance Use Topics   Alcohol use: No   Drug use: No    Home Medications Prior to Admission medications   Medication Sig Start Date End Date Taking? Authorizing Provider  azithromycin (ZITHROMAX) 250 MG tablet Take 1 tablet  (250 mg total) by mouth daily. Take first 2 tablets together, then 1 every day until finished. 03/01/21  Yes Sahar Ryback S, PA  benzonatate (TESSALON) 100 MG capsule Take 1 capsule (100 mg total) by mouth every 8 (eight) hours. 03/01/21  Yes Ladarian Bonczek S, PA  promethazine-dextromethorphan (PROMETHAZINE-DM) 6.25-15 MG/5ML syrup Take 5 mLs by mouth 4 (four) times daily as needed for cough. 03/01/21  Yes Delanie Tirrell S, PA  bisacodyl (DULCOLAX) 5 MG EC tablet Take 5 mg by mouth daily as needed for moderate constipation.    [provider]  Calcium Carb-Cholecalciferol (CALCIUM-VITAMIN D) 500-400 MG-UNIT TABS Take 1 tablet by mouth daily.    [provider]  cetirizine (ZYRTEC ALLERGY) 10 MG tablet Take 1 tablet (10 mg total) by mouth daily. 07/19/20   Hazel Sams, PA-C  cyclobenzaprine (FLEXERIL) 10 MG tablet Take 1 tablet (10 mg total) by mouth 2 (two) times daily as needed for muscle spasms. 02/27/21   Gareth Morgan, MD  diazepam (VALIUM) 5 MG tablet Take 2.5 mg by mouth at bedtime as needed for anxiety. Patient not taking: Reported on 10/20/2020    [provider]  diclofenac Sodium (VOLTAREN) 1 % GEL Apply 2 g topically 4 (four) times daily. 11/20/20   Hans Eden, NP  diphenhydrAMINE (  BENADRYL) 25 mg capsule Take 25 mg by mouth at bedtime as needed for itching or sleep.    [provider]  gabapentin (NEURONTIN) 300 MG capsule Take 1 capsule (300 mg total) by mouth 3 (three) times daily. Patient not taking: Reported on 10/20/2020 06/18/20   Jaynee Eagles, PA-C  haloperidol (HALDOL) 5 MG tablet Take 5 mg by mouth at bedtime.    [provider]  haloperidol decanoate (HALDOL DECANOATE) 50 MG/ML injection Inject 50 mg into the muscle every 28 (twenty-eight) days.    [provider]  lactulose (CHRONULAC) 10 GM/15ML solution Take 20 g by mouth 3 (three) times a week.    [provider]  methylPREDNISolone (MEDROL DOSEPAK) 4 MG  TBPK tablet See instructions 02/27/21   Gareth Morgan, MD  naproxen (NAPROSYN) 500 MG tablet Take 1 tablet (500 mg total) by mouth 2 (two) times daily as needed. 02/23/21   Horton, Drue Dun M, DO  omeprazole (PRILOSEC) 20 MG capsule Take 40 mg by mouth 2 (two) times daily before a meal.    [provider]  oxybutynin (DITROPAN) 5 MG tablet Take 5 mg by mouth 3 (three) times daily as needed for bladder spasms.    [provider]  oxyCODONE-acetaminophen (PERCOCET/ROXICET) 5-325 MG tablet Take 1 tablet by mouth every 6 (six) hours as needed for severe pain. 02/27/21   Gareth Morgan, MD  polyethylene glycol (MIRALAX / GLYCOLAX) packet Take 17 g by mouth 3 (three) times a week.    [provider]  pseudoephedrine (SUDAFED) 30 MG tablet Take 30 mg by mouth every 6 (six) hours as needed for congestion. Patient not taking: Reported on 10/20/2020    [provider]  sertraline (ZOLOFT) 50 MG tablet Take 50 mg by mouth daily.    [provider]  sulfamethoxazole-trimethoprim (BACTRIM DS) 800-160 MG tablet Take 1 tablet by mouth 2 (two) times daily. Patient not taking: Reported on 10/20/2020 02/02/16   Francine Graven, DO  VIAGRA 100 MG tablet Take 100 mg by mouth as needed for erectile dysfunction.  01/24/16   [provider]    Allergies    Penicillins  Review of Systems   Review of Systems  Constitutional:  Negative for chills and fever.  HENT:  Negative for congestion.   Eyes:  Negative for pain.  Respiratory:  Positive for shortness of breath and wheezing. Negative for cough.   Cardiovascular:  Negative for chest pain and leg swelling.  Gastrointestinal:  Negative for abdominal pain and vomiting.  Genitourinary:  Negative for dysuria.  Musculoskeletal:  Negative for myalgias.  Skin:  Negative for rash.  Neurological:  Negative for dizziness and headaches.   Physical Exam Updated Vital Signs BP (!) 148/82 (BP Location: Right Arm)   Pulse  89   Temp 99.3 F (37.4 C) (Oral)   Resp 18   SpO2 99%   Physical Exam Vitals and nursing note reviewed.  Constitutional:      General: He is not in acute distress. HENT:     Head: Normocephalic and atraumatic.     Nose: Nose normal.  Eyes:     General: No scleral icterus. Cardiovascular:     Rate and Rhythm: Normal rate and regular rhythm.     Pulses: Normal pulses.     Heart sounds: Normal heart sounds.  Pulmonary:     Effort: Pulmonary effort is normal. No respiratory distress.     Breath sounds: Wheezing present.     Comments: No tachypnea, no  increased work of breathing, aspiration normal length.  Faint expiratory wheezing noted.  Good air movement. Abdominal:     Palpations: Abdomen is soft.     Tenderness: There is no abdominal tenderness.  Musculoskeletal:     Cervical back: Normal range of motion.     Right lower leg: No edema.     Left lower leg: No edema.  Skin:    General: Skin is warm and dry.     Capillary Refill: Capillary refill takes less than 2 seconds.  Neurological:     Mental Status: He is alert. Mental status is at baseline.  Psychiatric:        Mood and Affect: Mood normal.        Behavior: Behavior normal.    ED Results / Procedures / Treatments   Labs (all labs ordered are listed, but only abnormal results are displayed) Labs Reviewed  CBC WITH DIFFERENTIAL/PLATELET - Abnormal; Notable for the following components:      Result Value   RBC 3.94 (*)    Hemoglobin 12.3 (*)    HCT 36.1 (*)    Lymphs Abs 0.4 (*)    Abs Immature Granulocytes 0.08 (*)    All other components within normal limits  BASIC METABOLIC PANEL - Abnormal; Notable for the following components:   Sodium 134 (*)    Glucose, Bld 131 (*)    All other components within normal limits  RESP PANEL BY RT-PCR (FLU A&B, COVID) ARPGX2    EKG None  Radiology DG Chest 2 View  Result Date: 03/01/2021 CLINICAL DATA:  Cough and shortness of breath. EXAM: CHEST - 2 VIEW  COMPARISON:  02/28/2021 FINDINGS: The cardiac silhouette remains upper limits of normal in size. Mild interstitial densities are present in the lungs bilaterally, improved from prior. There may be trace bilateral pleural effusions. No pneumothorax is identified. No acute osseous abnormality is seen. IMPRESSION: Improvement of mild bilateral interstitial densities which may reflect mild edema or infection. Electronically Signed   By: Logan Bores M.D.   On: 03/01/2021 07:39   DG Chest 2 View  Result Date: 02/28/2021 CLINICAL DATA:  Dyspnea, hypoxia EXAM: CHEST - 2 VIEW COMPARISON:  02/23/2021 FINDINGS: The lungs are symmetrically well inflated. Since the prior examination, there has developed fine basilar predominant interstitial pulmonary infiltrate most suggestive of interstitial pulmonary edema, though acute infection could appear similarly. No pneumothorax or pleural effusion. Cardiac size is at the upper limits of normal. No acute bone abnormality. IMPRESSION: Interval development of mild interstitial pulmonary infiltrate, edema versus infection. Electronically Signed   By: Fidela Salisbury M.D.   On: 02/28/2021 19:54    Procedures Procedures   Medications Ordered in ED Medications  albuterol (VENTOLIN HFA) 108 (90 Base) MCG/ACT inhaler 2 puff (2 puffs Inhalation Given 03/01/21 0856)  AeroChamber Plus Flo-Vu Large MISC 1 each (1 each Other Given 03/01/21 6301)    ED Course  I have reviewed the triage vital signs and the nursing notes.  Pertinent labs & imaging results that were available during my care of the patient were reviewed by me and considered in my medical decision making (see chart for details).    MDM Rules/Calculators/A&P                           Patient with a history of schizophrenia presented to the ER today with shortness of breath and wheezing he was prescribed methylprednisolone and cough medication at last ER  visit 2 days ago but has not taken these medications.  He  does not take any medicine  On examination today patient has some faint wheezing.  Provided with albuterol inhaler treatment with improvement of his shortness of breath.  Physical exam is otherwise unremarkable.  He is not having any chest pain.  No exertional symptoms or difficulty breathing when laying flat no orthopnea or PND  COVID influenza negative.  CBC without leukocytosis or anemia of significance, BMP unremarkable chest x-ray personally reviewed.  I do not see any focal infiltrate concerning for pneumonia however given radiology read the patient has some developing mild interstitial infiltrate will prescribe azithromycin along with prednisone benzonatate and Promethazine DM.  Patient given return precautions  He understands plans.  Ambulates without difficulty.  All questions answered the best my ability.  Final Clinical Impression(s) / ED Diagnoses Final diagnoses:  Acute cough  COPD exacerbation ([place])    Rx / DC Orders ED Discharge Orders          Ordered    azithromycin (ZITHROMAX) 250 MG tablet  Daily        03/01/21 0809    benzonatate (TESSALON) 100 MG capsule  Every 8 hours        03/01/21 0809    promethazine-dextromethorphan (PROMETHAZINE-DM) 6.25-15 MG/5ML syrup  4 times daily PRN        03/01/21 0809    predniSONE (DELTASONE) 10 MG tablet  Daily,   Status:  Discontinued        03/01/21 [place], Tayonna Bacha S, PA 03/01/21 1044    Dorie Rank, MD 03/02/21 1504

## 2021-03-01 NOTE — Discharge Instructions (Addendum)
Please use the cough medications I prescribed you as well as the antibiotic and prednisone.  Please continue taking the methylprednisolone that you were prescribed 2 days ago you do not need to take any additional prednisone/steroids.  The [employer] or benzonatate is a nondrowsy cough medication while the Promethazine DM will help with the cough but does cause some drowsiness.  Drink plenty of water.  Return to the ER for any new or concerning symptoms.

## 2021-03-04 ENCOUNTER — Encounter (HOSPITAL_COMMUNITY): Payer: Self-pay | Admitting: *Deleted

## 2021-04-12 ENCOUNTER — Encounter (HOSPITAL_COMMUNITY): Payer: Self-pay

## 2021-04-12 ENCOUNTER — Emergency Department (HOSPITAL_COMMUNITY)
Admission: EM | Admit: 2021-04-12 | Discharge: 2021-04-12 | Disposition: A | Discharge status: Home / Self Care | Payer: No Typology Code available for payment source | Attending: Emergency Medicine | Admitting: Emergency Medicine

## 2021-04-12 ENCOUNTER — Other Ambulatory Visit: Payer: Self-pay

## 2021-04-12 DIAGNOSIS — R531 Weakness: Secondary | ICD-10-CM | POA: Diagnosis not present

## 2021-04-12 DIAGNOSIS — F17210 Nicotine dependence, cigarettes, uncomplicated: Secondary | ICD-10-CM | POA: Insufficient documentation

## 2021-04-12 DIAGNOSIS — M5441 Lumbago with sciatica, right side: Secondary | ICD-10-CM | POA: Diagnosis not present

## 2021-04-12 DIAGNOSIS — M545 Low back pain, unspecified: Secondary | ICD-10-CM | POA: Diagnosis present

## 2021-04-12 DIAGNOSIS — M5442 Lumbago with sciatica, left side: Secondary | ICD-10-CM | POA: Diagnosis not present

## 2021-04-12 DIAGNOSIS — Z85828 Personal history of other malignant neoplasm of skin: Secondary | ICD-10-CM | POA: Insufficient documentation

## 2021-04-12 MED ORDER — METHYLPREDNISOLONE SODIUM SUCC 125 MG IJ SOLR: 125.0000 mg | Freq: Once | INTRAMUSCULAR | Status: DC

## 2021-04-12 MED ORDER — KETOROLAC TROMETHAMINE 30 MG/ML IJ SOLN
30.0000 mg | Freq: Once | INTRAMUSCULAR | Status: AC
  Administered 2021-04-12: 30 mg via INTRAMUSCULAR
  Filled 2021-04-12: qty 1

## 2021-04-12 MED ORDER — METHYLPREDNISOLONE SODIUM SUCC 125 MG IJ SOLR
125.0000 mg | Freq: Once | INTRAMUSCULAR | Status: AC
  Administered 2021-04-12: 125 mg via INTRAMUSCULAR
  Filled 2021-04-12: qty 2

## 2021-04-12 MED ORDER — KETOROLAC TROMETHAMINE 30 MG/ML IJ SOLN: 30.0000 mg | Freq: Once | INTRAMUSCULAR | Status: DC

## 2021-04-12 NOTE — ED Provider Notes (Signed)
Emergency Medicine Provider Triage Evaluation Note  Timothy [name] , a 65 y.o. male  was evaluated in triage.  Pt complains of low back pain secondary to history of spinal stenosis.  States that when he was here in October he had Toradol and Solu-Medrol with significant improvement in his pain and that he was able to walk much more comfortably for several days afterwards.  He presents today requesting the same.  He denies any increased numbness, tingling, or weakness in his legs.  Denies any saddle anesthesia.  Denies any recent fevers, chills, nausea, vomiting, sick contacts.  Review of Systems  Positive: Low back pain Negative: Saddle anesthesia, numbness, tingling or weakness in the legs increased from baseline  Physical Exam  BP 131/81 (BP Location: Left Arm)   Pulse 89   Temp 98.5 F (36.9 C)   Resp 18   Ht 6\' 1"  (1.854 m)   Wt 127 kg   SpO2 100%   BMI 36.94 kg/m  Gen:   Awake, no distress   Resp:  Normal effort  MSK:   Moves extremities without difficulty  Other:  Sensation and motor function intact in the lower extremities.  Tenderness palpation of the lumbar spine particularly lumbar paraspinous musculature.  Medical Decision Making  Medically screening exam initiated at 3:33 PM.  Appropriate orders placed.  [name] was informed that the remainder of the evaluation will be completed by another provider, this initial triage assessment does not replace that evaluation, and the importance of remaining in the ED until their evaluation is complete.  No red flags for back pain on today's MSE exam or reported in HPI.  This chart was dictated using voice recognition software, Dragon. Despite the best efforts of this provider to proofread and correct errors, errors may still occur which can change documentation meaning.    Emeline Darling, PA-C 04/12/21 1611    Jeanell Sparrow, DO 04/13/21 1621

## 2021-04-12 NOTE — Discharge Instructions (Signed)
1) Please review your MRI results below and follow up with your PCP to discuss referral to spine surgeon.  2) Tylenol and motrin as needed for pain.  3) Return with worsening back pain, numbness, urinary or fecal incontinence, fevers.  MRI Lumbar Spine 02/23/21: Severe facet arthropathy and advanced disc degeneration at L5-S1 in the setting of mild grade 1 anterolisthesis which is new since 2017. Multiple left side synovial cysts contribute to stenosis at the both exiting left L5 and descending left S1 nerve levels. Correlate for corresponding radiculitis. And there is moderate to severe overall spinal stenosis there. 2.   Moderate multifactorial spinal stenosis at L4-L5, with up to severe left L4 foraminal stenosis in part related to foraminal disc herniation. Correlate for left L4 radiculitis.

## 2021-04-12 NOTE — ED Provider Notes (Signed)
[facility] EMERGENCY DEPARTMENT Provider Note   CSN: [id number] Arrival date & time: 04/12/21  1416     History Chief Complaint  Patient presents with   Back Pain    Spinal Stenosis     Timothy [name] is a 65 y.o. male.  The history is provided by the patient and medical records.  Back Pain Location:  Lumbar spine Quality:  Aching Radiates to: bilateral legs. Pain severity:  Severe Pain is:  Same all the time Onset quality:  Gradual Duration:  3 days Timing:  Constant Progression:  Worsening Chronicity:  Recurrent Context comment:  Hx spinal cord ca, known spinal stenosis Relieved by:  Nothing Worsened by:  Ambulation and movement Associated symptoms: no abdominal pain, no chest pain, no dysuria and no fever     MRI Lumbar Spine 02/23/21: Severe facet arthropathy and advanced disc degeneration at L5-S1 in the setting of mild grade 1 anterolisthesis which is new since 2017. Multiple left side synovial cysts contribute to stenosis at the both exiting left L5 and descending left S1 nerve levels. Correlate for corresponding radiculitis. And there is moderate to severe overall spinal stenosis there. 2.   Moderate multifactorial spinal stenosis at L4-L5, with up to severe left L4 foraminal stenosis in part related to foraminal disc herniation. Correlate for left L4 radiculitis.      Past Medical History:  Diagnosis Date   Behavioral disorder    Cancer [facility])    Constipation    Gonorrhea     Patient Active Problem List   Diagnosis Date Noted   Constipation 02/02/2016   UTI (lower urinary tract infection)    Paranoid schizophrenia ([place]) 02/01/2016    Past Surgical History:  Procedure Laterality Date   BACK SURGERY         Family History  Family history unknown: Yes    Social History   Tobacco Use   Smoking status: Every Day    Packs/day: 1.00    Types: Cigarettes   Smokeless tobacco: Never  Vaping Use   Vaping Use: Never  used  Substance Use Topics   Alcohol use: No   Drug use: No    Home Medications Prior to Admission medications   Medication Sig Start Date End Date Taking? Authorizing Provider  azithromycin (ZITHROMAX) 250 MG tablet Take 1 tablet (250 mg total) by mouth daily. Take first 2 tablets together, then 1 every day until finished. 03/01/21   Tedd Sias, PA  benzonatate (TESSALON) 100 MG capsule Take 1 capsule (100 mg total) by mouth every 8 (eight) hours. 03/01/21   Tedd Sias, PA  bisacodyl (DULCOLAX) 5 MG EC tablet Take 5 mg by mouth daily as needed for moderate constipation.    [provider]  Calcium Carb-Cholecalciferol (CALCIUM-VITAMIN D) 500-400 MG-UNIT TABS Take 1 tablet by mouth daily.    [provider]  cetirizine (ZYRTEC ALLERGY) 10 MG tablet Take 1 tablet (10 mg total) by mouth daily. 07/19/20   Hazel Sams, PA-C  cyclobenzaprine (FLEXERIL) 10 MG tablet Take 1 tablet (10 mg total) by mouth 2 (two) times daily as needed for muscle spasms. 02/27/21   Gareth Morgan, MD  diazepam (VALIUM) 5 MG tablet Take 2.5 mg by mouth at bedtime as needed for anxiety. Patient not taking: Reported on 10/20/2020    [provider]  diclofenac Sodium (VOLTAREN) 1 % GEL Apply 2 g topically 4 (four) times daily. 11/20/20   Hans Eden, NP  diphenhydrAMINE (BENADRYL)  25 mg capsule Take 25 mg by mouth at bedtime as needed for itching or sleep.    [provider]  gabapentin (NEURONTIN) 300 MG capsule Take 1 capsule (300 mg total) by mouth 3 (three) times daily. Patient not taking: Reported on 10/20/2020 06/18/20   Jaynee Eagles, PA-C  haloperidol (HALDOL) 5 MG tablet Take 5 mg by mouth at bedtime.    [provider]  haloperidol decanoate (HALDOL DECANOATE) 50 MG/ML injection Inject 50 mg into the muscle every 28 (twenty-eight) days.    [provider]  lactulose (CHRONULAC) 10 GM/15ML solution Take 20 g by mouth 3 (three) times a week.     [provider]  methylPREDNISolone (MEDROL DOSEPAK) 4 MG TBPK tablet See instructions 02/27/21   Gareth Morgan, MD  naproxen (NAPROSYN) 500 MG tablet Take 1 tablet (500 mg total) by mouth 2 (two) times daily as needed. 02/23/21   Horton, Drue Dun M, DO  omeprazole (PRILOSEC) 20 MG capsule Take 40 mg by mouth 2 (two) times daily before a meal.    [provider]  oxybutynin (DITROPAN) 5 MG tablet Take 5 mg by mouth 3 (three) times daily as needed for bladder spasms.    [provider]  oxyCODONE-acetaminophen (PERCOCET/ROXICET) 5-325 MG tablet Take 1 tablet by mouth every 6 (six) hours as needed for severe pain. 02/27/21   Gareth Morgan, MD  polyethylene glycol (MIRALAX / GLYCOLAX) packet Take 17 g by mouth 3 (three) times a week.    [provider]  promethazine-dextromethorphan (PROMETHAZINE-DM) 6.25-15 MG/5ML syrup Take 5 mLs by mouth 4 (four) times daily as needed for cough. 03/01/21   Tedd Sias, PA  pseudoephedrine (SUDAFED) 30 MG tablet Take 30 mg by mouth every 6 (six) hours as needed for congestion. Patient not taking: Reported on 10/20/2020    [provider]  sertraline (ZOLOFT) 50 MG tablet Take 50 mg by mouth daily.    [provider]  sulfamethoxazole-trimethoprim (BACTRIM DS) 800-160 MG tablet Take 1 tablet by mouth 2 (two) times daily. Patient not taking: Reported on 10/20/2020 02/02/16   Francine Graven, DO  VIAGRA 100 MG tablet Take 100 mg by mouth as needed for erectile dysfunction.  01/24/16   [provider]    Allergies    Penicillins  Review of Systems   Review of Systems  Constitutional:  Negative for chills and fever.  HENT:  Negative for ear pain and sore throat.   Eyes:  Negative for pain and visual disturbance.  Respiratory:  Negative for cough and shortness of breath.   Cardiovascular:  Negative for chest pain and palpitations.  Gastrointestinal:  Negative for abdominal pain and vomiting.   Genitourinary:  Negative for dysuria and hematuria.  Musculoskeletal:  Positive for back pain and gait problem. Negative for arthralgias.  Skin:  Negative for color change and rash.  Neurological:  Negative for seizures and syncope.  All other systems reviewed and are negative.  Physical Exam Updated Vital Signs BP (!) 142/76   Pulse 80   Temp 98.9 F (37.2 C)   Resp 16   Ht 6\' 1"  (1.854 m)   Wt 127 kg   SpO2 99%   BMI 36.94 kg/m   Physical Exam Vitals and nursing note reviewed.  Constitutional:      General: He is not in acute distress.    Appearance: Normal appearance. He is well-developed.  HENT:     Head: Normocephalic and atraumatic.     Right Ear: External  ear normal.     Left Ear: External ear normal.     Nose: Nose normal. No congestion.     Mouth/Throat:     Mouth: Mucous membranes are moist.     Pharynx: Oropharynx is clear. No posterior oropharyngeal erythema.  Eyes:     Extraocular Movements: Extraocular movements intact.     Conjunctiva/sclera: Conjunctivae normal.     Pupils: Pupils are equal, round, and reactive to light.  Cardiovascular:     Rate and Rhythm: Normal rate and regular rhythm.     Pulses: Normal pulses.     Heart sounds: No murmur heard. Pulmonary:     Effort: Pulmonary effort is normal. No respiratory distress.     Breath sounds: Normal breath sounds. No wheezing, rhonchi or rales.  Abdominal:     General: Abdomen is flat. Bowel sounds are normal.     Palpations: Abdomen is soft.     Tenderness: There is no abdominal tenderness. There is no guarding or rebound.  Musculoskeletal:        General: Tenderness (Lower L spine diffuse tenderness to palpation, no step offs or deformities) present. No swelling, deformity or signs of injury. Normal range of motion.     Cervical back: Normal range of motion and neck supple. No rigidity.     Right lower leg: No edema.     Left lower leg: No edema.  Skin:    General: Skin is warm and dry.      Capillary Refill: Capillary refill takes less than 2 seconds.     Findings: No rash.  Neurological:     General: No focal deficit present.     Mental Status: He is alert and oriented to person, place, and time.     Cranial Nerves: No cranial nerve deficit.     Sensory: No sensory deficit.     Motor: Weakness (symmetric lower extremities, 4/5) present.     Coordination: Coordination normal.     Gait: Gait abnormal (arthralgic gaits, ambulates w/ walker).  Psychiatric:        Mood and Affect: Mood normal.    ED Results / Procedures / Treatments   Labs (all labs ordered are listed, but only abnormal results are displayed) Labs Reviewed - No data to display  EKG None  Radiology No results found.  Procedures Procedures   Medications Ordered in ED Medications  ketorolac (TORADOL) 30 MG/ML injection 30 mg (30 mg Intramuscular Given 04/12/21 1712)  methylPREDNISolone sodium succinate (SOLU-MEDROL) 125 mg/2 mL injection 125 mg (125 mg Intramuscular Given 04/12/21 1712)    ED Course  I have reviewed the triage vital signs and the nursing notes.  Pertinent labs & imaging results that were available during my care of the patient were reviewed by me and considered in my medical decision making (see chart for details).    MDM Rules/Calculators/A&P                          65 y/o male w/ known spinal cord stenosis presenting w/ acute on chronic lower back pain. Afebrile, vitally stable here. Nontoxic appearing. Not tachycardic. No hx IVDU. Not on anticoagulation. Low concerns for epidural abscess or hematoma. No new falls or trauma. No saddle anesthesia, worsening urinary or fecal incontinence. Low concern for acute fx or spinal cord injury. No urinary sxs or cva tenderness. Low concern for uti or pyelo. Patient has f/u w/ pcp on Wednesday and is working on referral  to spine surgery team for further management of known spinal stenosis. Given IM toradol and solu medrol. Able to stand and  ambulate w/ walker. Reports improvement in pain. Appropriate for dc home. Strict return precautions provided.    Final Clinical Impression(s) / ED Diagnoses Final diagnoses:  Acute bilateral low back pain with bilateral sciatica    Rx / DC Orders ED Discharge Orders     None        Idamae Lusher, MD 04/13/21 1236    Luna Fuse, MD 04/17/21 1430

## 2021-04-12 NOTE — ED Triage Notes (Signed)
Pt here for spinal stenosis. Pt stating that he had two steroid shots back in October. Pt stated that he wants to get those same shots. Pt endorsing numbness and tingling in legs and inability to walk at this time.

## 2021-04-20 ENCOUNTER — Other Ambulatory Visit: Payer: Self-pay

## 2021-04-20 ENCOUNTER — Encounter (HOSPITAL_COMMUNITY): Payer: Self-pay | Admitting: Emergency Medicine

## 2021-04-20 ENCOUNTER — Emergency Department (HOSPITAL_COMMUNITY): Payer: No Typology Code available for payment source

## 2021-04-20 ENCOUNTER — Emergency Department (HOSPITAL_COMMUNITY)
Admission: EM | Admit: 2021-04-20 | Discharge: 2021-04-20 | Disposition: A | Discharge status: Home / Self Care | Payer: No Typology Code available for payment source | Attending: Emergency Medicine | Admitting: Emergency Medicine

## 2021-04-20 DIAGNOSIS — M545 Low back pain, unspecified: Secondary | ICD-10-CM | POA: Insufficient documentation

## 2021-04-20 DIAGNOSIS — K5900 Constipation, unspecified: Secondary | ICD-10-CM | POA: Diagnosis not present

## 2021-04-20 DIAGNOSIS — Z85848 Personal history of malignant neoplasm of other parts of nervous tissue: Secondary | ICD-10-CM | POA: Insufficient documentation

## 2021-04-20 DIAGNOSIS — G8929 Other chronic pain: Secondary | ICD-10-CM | POA: Insufficient documentation

## 2021-04-20 DIAGNOSIS — F17210 Nicotine dependence, cigarettes, uncomplicated: Secondary | ICD-10-CM | POA: Diagnosis not present

## 2021-04-20 DIAGNOSIS — R197 Diarrhea, unspecified: Secondary | ICD-10-CM | POA: Diagnosis not present

## 2021-04-20 LAB — CBC WITH DIFFERENTIAL/PLATELET
Abs Immature Granulocytes: 0.02 10*3/uL (ref 0.00–0.07)
Basophils Absolute: 0 10*3/uL (ref 0.0–0.1)
Basophils Relative: 1 %
Eosinophils Absolute: 0.4 10*3/uL (ref 0.0–0.5)
Eosinophils Relative: 7 %
HCT: 39.5 % (ref 39.0–52.0)
Hemoglobin: 13.4 g/dL (ref 13.0–17.0)
Immature Granulocytes: 0 %
Lymphocytes Relative: 17 %
Lymphs Abs: 1.1 10*3/uL (ref 0.7–4.0)
MCH: 31.2 pg (ref 26.0–34.0)
MCHC: 33.9 g/dL (ref 30.0–36.0)
MCV: 91.9 fL (ref 80.0–100.0)
Monocytes Absolute: 0.5 10*3/uL (ref 0.1–1.0)
Monocytes Relative: 8 %
Neutro Abs: 4.3 10*3/uL (ref 1.7–7.7)
Neutrophils Relative %: 67 %
Platelets: 227 10*3/uL (ref 150–400)
RBC: 4.3 MIL/uL (ref 4.22–5.81)
RDW: 13 % (ref 11.5–15.5)
WBC: 6.3 10*3/uL (ref 4.0–10.5)
nRBC: 0 % (ref 0.0–0.2)

## 2021-04-20 LAB — BASIC METABOLIC PANEL
Anion gap: 7 (ref 5–15)
BUN: 7 mg/dL — ABNORMAL LOW (ref 8–23)
CO2: 28 mmol/L (ref 22–32)
Calcium: 9.2 mg/dL (ref 8.9–10.3)
Chloride: 101 mmol/L (ref 98–111)
Creatinine, Ser: 0.78 mg/dL (ref 0.61–1.24)
GFR, Estimated: >60 mL/min (ref >60)
Glucose, Bld: 105 mg/dL — ABNORMAL HIGH (ref 70–99)
Potassium: 3.7 mmol/L (ref 3.5–5.1)
Sodium: 136 mmol/L (ref 135–145)

## 2021-04-20 IMAGING — MR MR LUMBAR SPINE WO/W CM
4 of 7 series · 19 of 48 positions shown · IV contrast (gadavist)
Comparison: [DATE]

CLINICAL DATA: Low back pain

EXAM:
MRI LUMBAR SPINE WITHOUT AND WITH CONTRAST
TECHNIQUE: Multiplanar and multiecho pulse sequences of the lumbar spine were
obtained without and with intravenous contrast.
CONTRAST:  10mL GADAVIST GADOBUTROL 1 MMOL/ML IV SOLN

[Series 2: T2 · sagittal · 4.0mm · 0.55mm/px · 5 of 17 slices shown (1 of 2)]
[im 1/17]
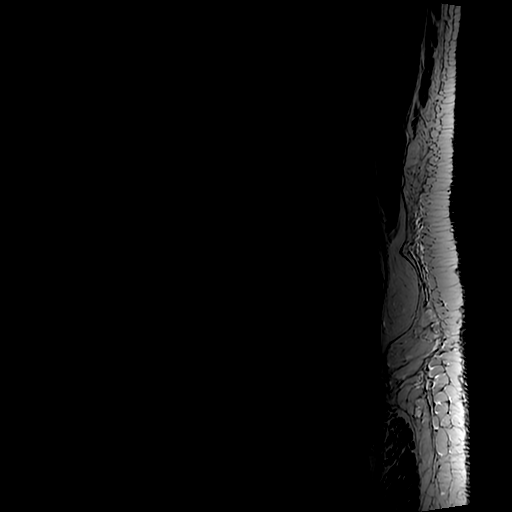
[im 5/17]
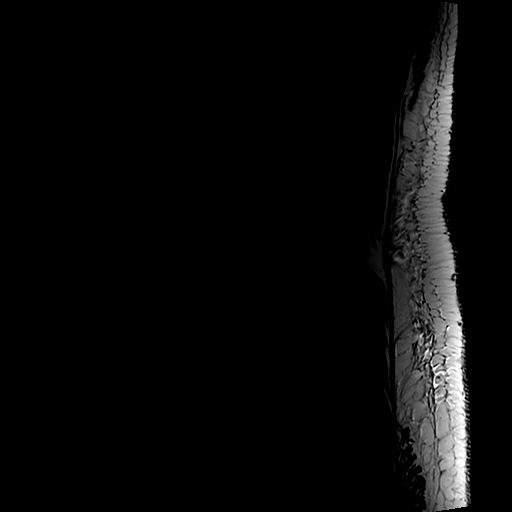
[im 9/17]
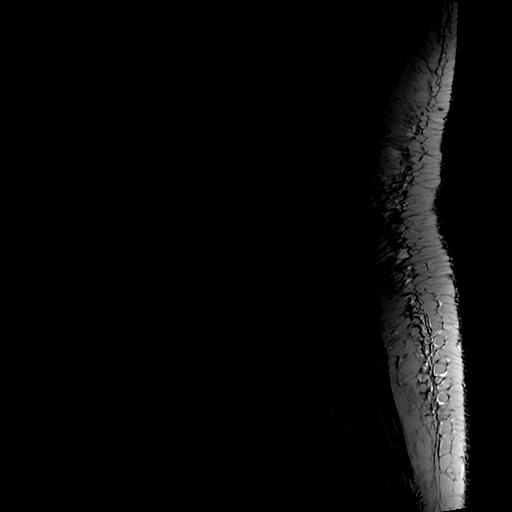
[im 13/17]
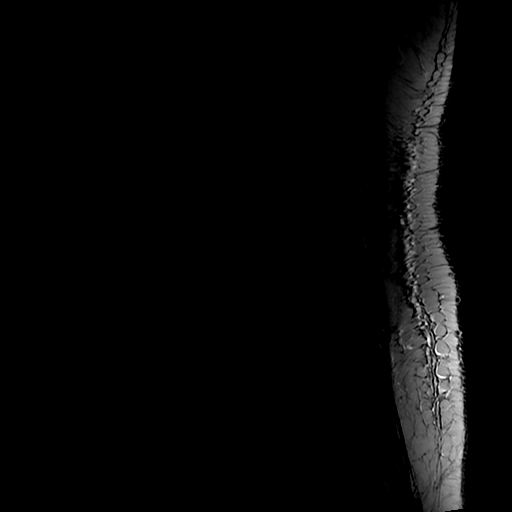
[im 17/17]
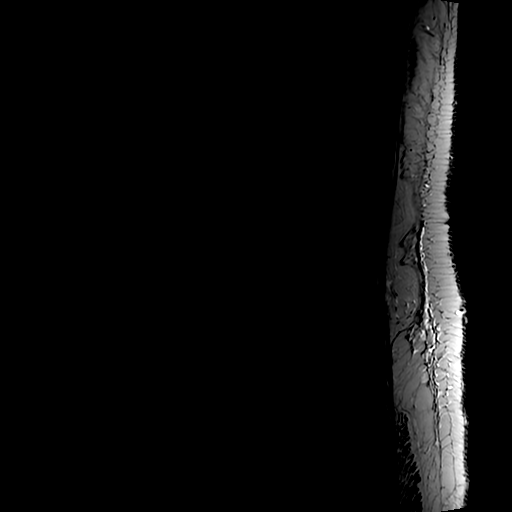

[Series 4: T1 · sagittal · 4.0mm · 0.55mm/px · 3 of 17 slices shown (1 of 2)]
[im 1/17]
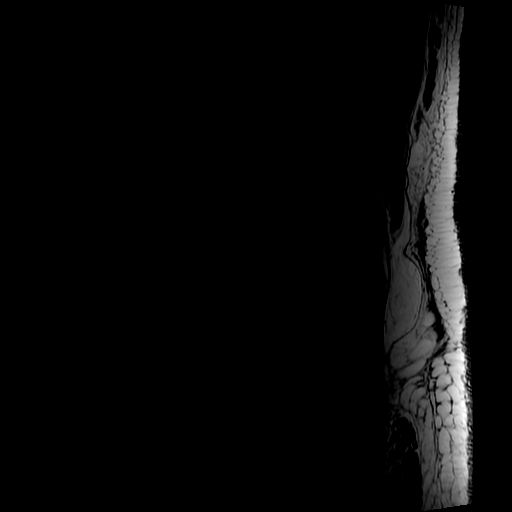
[im 11/17]
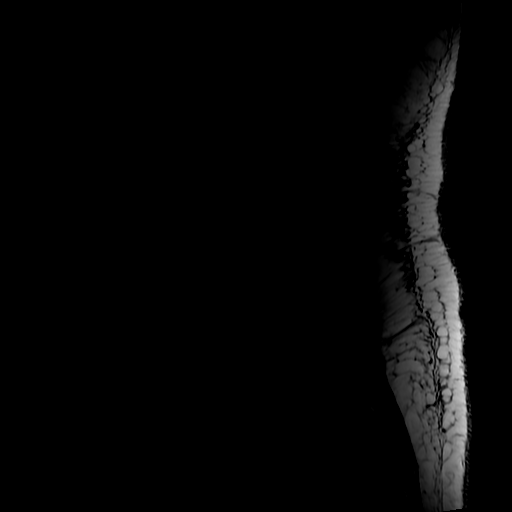
[im 17/17]
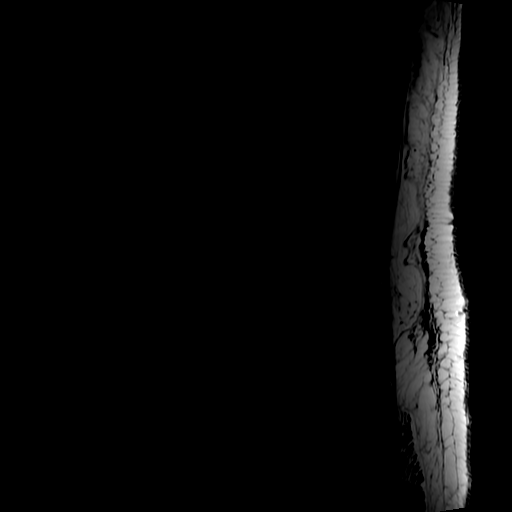

[Series 5: T2 · axial · 4.0mm · 0.39mm/px · z∈[-60,+168]mm · 8 of 43 slices shown (2 of 2)]
[im 1/43]
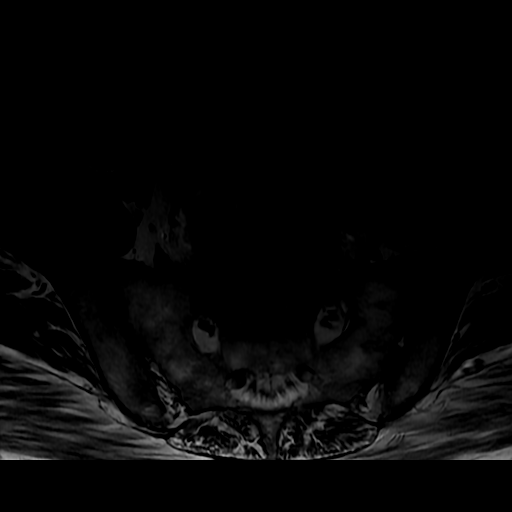
[im 5/43]
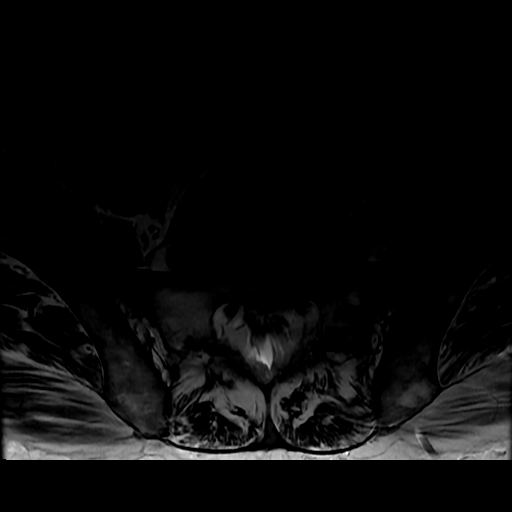
[im 15/43]
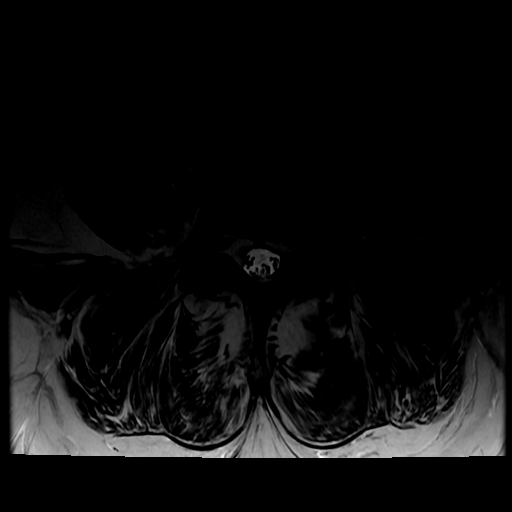
[im 19/43]
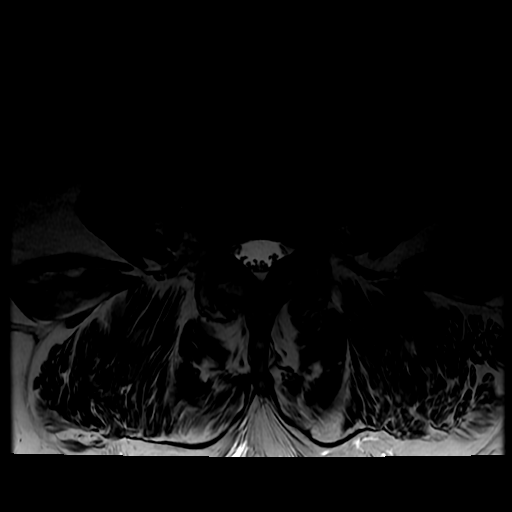
[im 24/43]
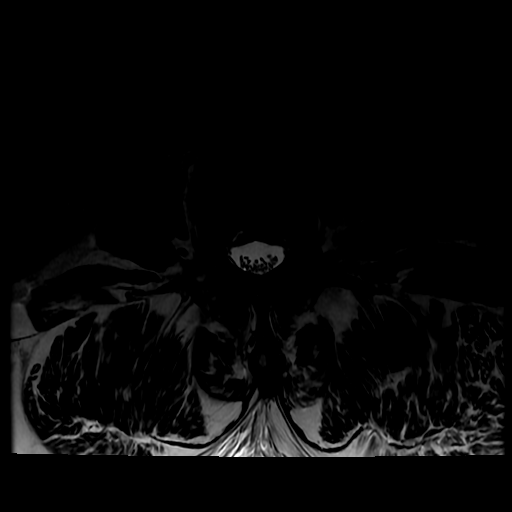
[im 29/43]
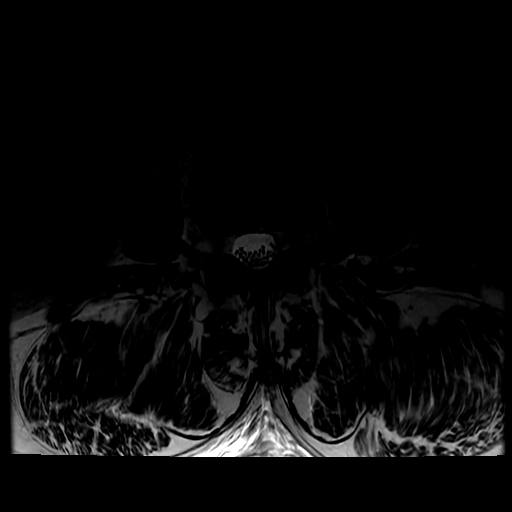
[im 38/43]
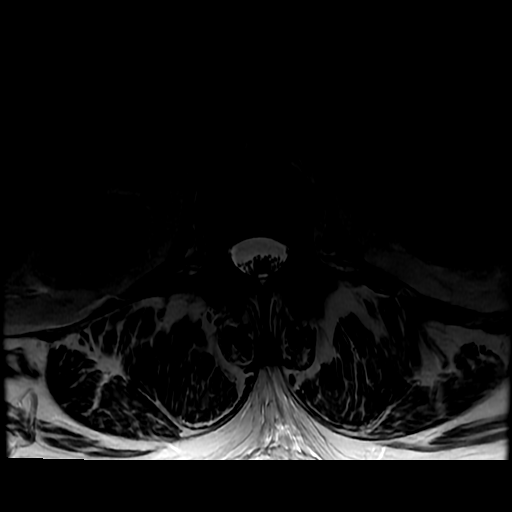
[im 43/43]
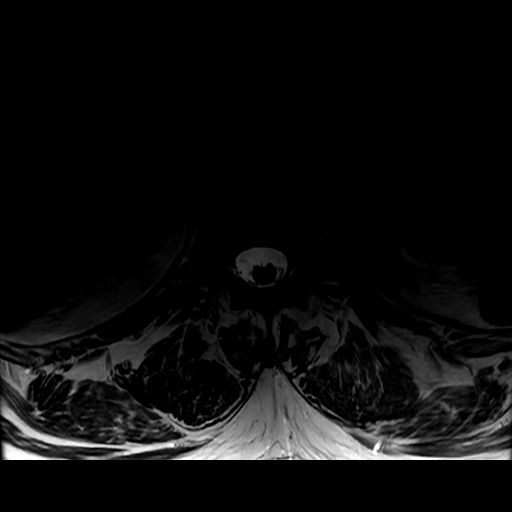

[Series 6: T1 · axial · 4.0mm · 0.39mm/px · z∈[-40,+144]mm · 3 of 43 slices shown (2 of 2)]
[im 5/43]
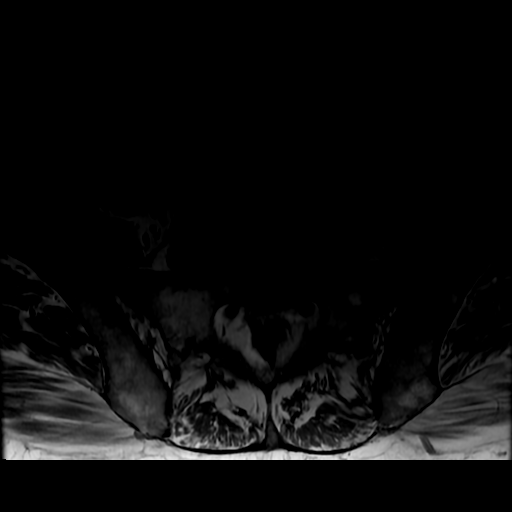
[im 24/43]
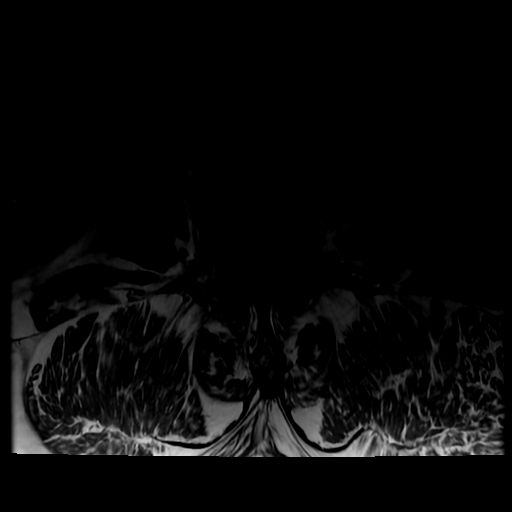
[im 38/43]
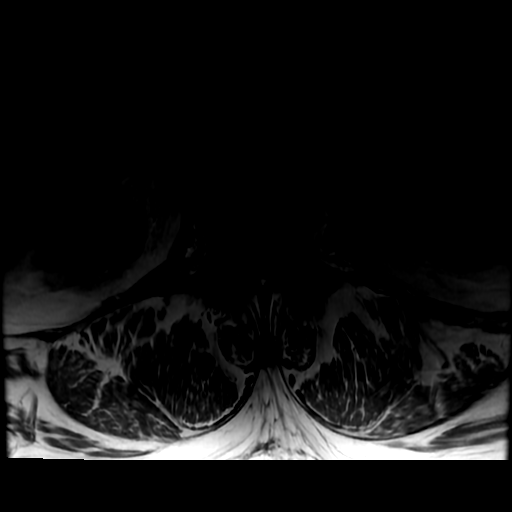

[19 of 48 positions shown; findings below may reference images not displayed]

FINDINGS: Segmentation:  Standard.

Alignment:  Unchanged grade 1 anterolisthesis at L5-S1

Vertebrae: No fracture, evidence of discitis, or bone lesion. No
abnormal contrast enhancement

Conus medullaris and cauda equina: Conus extends to the L1 level.
Conus and cauda equina appear normal.

Paraspinal and other soft tissues: Negative.

Disc levels:

L1-L2: Normal disc space and facet joints. No spinal canal stenosis.
No neural foraminal stenosis.

L2-L3: Moderate facet hypertrophy is unchanged. No disc herniation.
No spinal canal stenosis. No neural foraminal stenosis.

L3-L4: Moderate facet hypertrophy and small disc bulge, unchanged.
No spinal canal stenosis. No neural foraminal stenosis.

L4-L5: Intermediate sized disc bulge and moderate facet hypertrophy,
unchanged. Left lateral recess narrowing without central spinal
canal stenosis. Unchanged mild left neural foraminal stenosis.

L5-S1: Severe facet arthrosis, unchanged. No spinal canal stenosis.
Unchanged mild right and moderate left neural foraminal stenosis.

Visualized sacrum: Normal.
IMPRESSION: 1. Unchanged examination of the lumbar spine with moderate-to-severe
facet arthrosis at L2-L3, L3-L4, L4-L5 and L5-S1.
2. Unchanged L4-L5 left lateral recess and mild left neural
foraminal stenosis.
3. Unchanged L5-S1 mild right and moderate left neural foraminal
stenosis.

## 2021-04-20 MED ORDER — DEXAMETHASONE SODIUM PHOSPHATE 10 MG/ML IJ SOLN
10.0000 mg | Freq: Once | INTRAMUSCULAR | Status: AC
  Administered 2021-04-20: 10 mg via INTRAVENOUS
  Filled 2021-04-20: qty 1

## 2021-04-20 MED ORDER — MORPHINE SULFATE (PF) 4 MG/ML IV SOLN
4.0000 mg | Freq: Once | INTRAVENOUS | Status: AC
Start: 2021-04-20 — End: 2021-04-20
  Administered 2021-04-20: 4 mg via INTRAVENOUS
  Filled 2021-04-20: qty 1

## 2021-04-20 MED ORDER — GADOBUTROL 1 MMOL/ML IV SOLN
10.0000 mL | Freq: Once | INTRAVENOUS | Status: AC | PRN
  Administered 2021-04-20: 10 mL via INTRAVENOUS

## 2021-04-20 MED ORDER — KETOROLAC TROMETHAMINE 15 MG/ML IJ SOLN
15.0000 mg | Freq: Once | INTRAMUSCULAR | Status: AC
  Administered 2021-04-20: 15 mg via INTRAVENOUS
  Filled 2021-04-20: qty 1

## 2021-04-20 NOTE — ED Provider Notes (Addendum)
[place] EMERGENCY DEPARTMENT Provider Note   CSN: [id number] Arrival date & time: 04/20/21  1328     History No chief complaint on file.   Timothy [name] is a 65 y.o. male.  The history is provided by the patient and medical records. No language interpreter was used.   65 year old male with known history of spinal cord stenosis status post prior back surgery, who presents with complains of back pain.  Patient mention he has history [facility].  He normally use a walker to walk.  For the past 5 days he reported having progressive worsening lower back pain which she described as an achy sensation persistent worse with movement.  Furthermore he also complaining of having either constipation or diarrhea that has been fluctuating back and forth.  He is requiring aide to help with his ADL more than usual.  His pain is moderate to severe.  He mentioned that he is mostly paralyzed and denies worsening numbness.  No fever chills nausea vomiting and denies any urinary symptoms.  He has been using his walker for the past several days and did require help from his aid to get to the ER  Past Medical History:  Diagnosis Date   Behavioral disorder    Cancer [facility])    Constipation    Gonorrhea     Patient Active Problem List   Diagnosis Date Noted   Constipation 02/02/2016   UTI (lower urinary tract infection)    Paranoid schizophrenia ([place]) 02/01/2016    Past Surgical History:  Procedure Laterality Date   BACK SURGERY         Family History  Family history unknown: Yes    Social History   Tobacco Use   Smoking status: Every Day    Packs/day: 1.00    Types: Cigarettes   Smokeless tobacco: Never  Vaping Use   Vaping Use: Never used  Substance Use Topics   Alcohol use: No   Drug use: No    Home Medications Prior to Admission medications   Medication Sig Start Date End Date Taking? Authorizing Provider   azithromycin (ZITHROMAX) 250 MG tablet Take 1 tablet (250 mg total) by mouth daily. Take first 2 tablets together, then 1 every day until finished. 03/01/21   Tedd Sias, PA  benzonatate (TESSALON) 100 MG capsule Take 1 capsule (100 mg total) by mouth every 8 (eight) hours. 03/01/21   Tedd Sias, PA  bisacodyl (DULCOLAX) 5 MG EC tablet Take 5 mg by mouth daily as needed for moderate constipation.    [provider]  Calcium Carb-Cholecalciferol (CALCIUM-VITAMIN D) 500-400 MG-UNIT TABS Take 1 tablet by mouth daily.    [provider]  cetirizine (ZYRTEC ALLERGY) 10 MG tablet Take 1 tablet (10 mg total) by mouth daily. 07/19/20   Hazel Sams, PA-C  cyclobenzaprine (FLEXERIL) 10 MG tablet Take 1 tablet (10 mg total) by mouth 2 (two) times daily as needed for muscle spasms. 02/27/21   Gareth Morgan, MD  diazepam (VALIUM) 5 MG tablet Take 2.5 mg by mouth at bedtime as needed for anxiety. Patient not taking: Reported on 10/20/2020    [provider]  diclofenac Sodium (VOLTAREN) 1 % GEL Apply 2 g topically 4 (four) times daily. 11/20/20   Hans Eden, NP  diphenhydrAMINE (BENADRYL) 25 mg capsule Take 25 mg by mouth at bedtime as needed for itching or sleep.    [provider]  gabapentin (NEURONTIN) 300 MG capsule Take 1 capsule (300 mg total) by mouth 3 (three) times daily. Patient not taking: Reported on 10/20/2020 06/18/20   Jaynee Eagles, PA-C  haloperidol (HALDOL) 5 MG tablet Take 5 mg by mouth at bedtime.    [provider]  haloperidol decanoate (HALDOL DECANOATE) 50 MG/ML injection Inject 50 mg into the muscle every 28 (twenty-eight) days.    [provider]  lactulose (CHRONULAC) 10 GM/15ML solution Take 20 g by mouth 3 (three) times a week.    [provider]  methylPREDNISolone (MEDROL DOSEPAK) 4 MG TBPK tablet See instructions 02/27/21   Gareth Morgan, MD  naproxen (NAPROSYN) 500 MG tablet Take 1 tablet (500 mg  total) by mouth 2 (two) times daily as needed. 02/23/21   Horton, Drue Dun M, DO  omeprazole (PRILOSEC) 20 MG capsule Take 40 mg by mouth 2 (two) times daily before a meal.    [provider]  oxybutynin (DITROPAN) 5 MG tablet Take 5 mg by mouth 3 (three) times daily as needed for bladder spasms.    [provider]  oxyCODONE-acetaminophen (PERCOCET/ROXICET) 5-325 MG tablet Take 1 tablet by mouth every 6 (six) hours as needed for severe pain. 02/27/21   Gareth Morgan, MD  polyethylene glycol (MIRALAX / GLYCOLAX) packet Take 17 g by mouth 3 (three) times a week.    [provider]  promethazine-dextromethorphan (PROMETHAZINE-DM) 6.25-15 MG/5ML syrup Take 5 mLs by mouth 4 (four) times daily as needed for cough. 03/01/21   Tedd Sias, PA  pseudoephedrine (SUDAFED) 30 MG tablet Take 30 mg by mouth every 6 (six) hours as needed for congestion. Patient not taking: Reported on 10/20/2020    [provider]  sertraline (ZOLOFT) 50 MG tablet Take 50 mg by mouth daily.    [provider]  sulfamethoxazole-trimethoprim (BACTRIM DS) 800-160 MG tablet Take 1 tablet by mouth 2 (two) times daily. Patient not taking: Reported on 10/20/2020 02/02/16   Francine Graven, DO  VIAGRA 100 MG tablet Take 100 mg by mouth as needed for erectile dysfunction.  01/24/16   [provider]    Allergies    Penicillins  Review of Systems   Review of Systems  All other systems reviewed and are negative.  Physical Exam Updated Vital Signs BP 140/83 (BP Location: Left Arm)   Pulse 93   Temp 98.6 F (37 C)   Resp 14   SpO2 99%   Physical Exam Vitals and nursing note reviewed.  Constitutional:      General: He is not in acute distress.    Appearance: He is well-developed. He is obese.     Comments: Obese male resting comfortably in bed in no acute discomfort.    HENT:     Head: Atraumatic.  Eyes:     Conjunctiva/sclera: Conjunctivae normal.  Cardiovascular:      Rate and Rhythm: Normal rate and regular rhythm.     Pulses: Normal pulses.     Heart sounds: Normal heart sounds.  Pulmonary:     Breath sounds: Normal breath sounds.  Abdominal:     Tenderness: There is no abdominal tenderness.  Musculoskeletal:        General: Tenderness (Tenderness along lumbar and paralumbar spine on palpation.) present.     Cervical back: Neck supple.  Skin:    Findings: No rash.  Neurological:     Mental Status: He is alert.     Comments: Able to move bilateral lower extremities with  4 out of 5 strength, I have discussed assessment and plan with my attending, who acknowledge and in agreement with plan sensation to light touch to both legs.    ED Results / Procedures / Treatments   Labs (all labs ordered are listed, but only abnormal results are displayed) Labs Reviewed  BASIC METABOLIC PANEL - Abnormal; Notable for the following components:      Result Value   Glucose, Bld 105 (*)    BUN 7 (*)    All other components within normal limits  CBC WITH DIFFERENTIAL/PLATELET  URINALYSIS, ROUTINE W REFLEX MICROSCOPIC    EKG None  Radiology MR Lumbar Spine W Wo Contrast  Result Date: 04/20/2021 CLINICAL DATA:  Low back pain EXAM: MRI LUMBAR SPINE WITHOUT AND WITH CONTRAST TECHNIQUE: Multiplanar and multiecho pulse sequences of the lumbar spine were obtained without and with intravenous contrast. CONTRAST:  77mL GADAVIST GADOBUTROL 1 MMOL/ML IV SOLN COMPARISON:  02/23/2021 FINDINGS: Segmentation:  Standard. Alignment:  Unchanged grade 1 anterolisthesis at L5-S1 Vertebrae: No fracture, evidence of discitis, or bone lesion. No abnormal contrast enhancement Conus medullaris and cauda equina: Conus extends to the L1 level. Conus and cauda equina appear normal. Paraspinal and other soft tissues: Negative. Disc levels: L1-L2: Normal disc space and facet joints. No spinal canal stenosis. No neural foraminal stenosis. L2-L3: Moderate facet hypertrophy is unchanged. No  disc herniation. No spinal canal stenosis. No neural foraminal stenosis. L3-L4: Moderate facet hypertrophy and small disc bulge, unchanged. No spinal canal stenosis. No neural foraminal stenosis. L4-L5: Intermediate sized disc bulge and moderate facet hypertrophy, unchanged. Left lateral recess narrowing without central spinal canal stenosis. Unchanged mild left neural foraminal stenosis. L5-S1: Severe facet arthrosis, unchanged. No spinal canal stenosis. Unchanged mild right and moderate left neural foraminal stenosis. Visualized sacrum: Normal. IMPRESSION: 1. Unchanged examination of the lumbar spine with moderate-to-severe facet arthrosis at L2-L3, L3-L4, L4-L5 and L5-S1. 2. Unchanged L4-L5 left lateral recess and mild left neural foraminal stenosis. 3. Unchanged L5-S1 mild right and moderate left neural foraminal stenosis. Electronically Signed   By: Ulyses Jarred M.D.   On: 04/20/2021 20:13    Procedures Procedures   Medications Ordered in ED Medications  morphine 4 MG/ML injection 4 mg (4 mg Intravenous Given 04/20/21 1809)  gadobutrol (GADAVIST) 1 MMOL/ML injection 10 mL (10 mLs Intravenous Contrast Given 04/20/21 1937)  ketorolac (TORADOL) 15 MG/ML injection 15 mg (15 mg Intravenous Given 04/20/21 2100)  dexamethasone (DECADRON) injection 10 mg (10 mg Intravenous Given 04/20/21 2059)    ED Course  I have reviewed the triage vital signs and the nursing notes.  Pertinent labs & imaging results that were available during my care of the patient were reviewed by me and considered in my medical decision making (see chart for details).    MDM Rules/Calculators/A&P                           BP 129/85   Pulse 65   Temp 98.6 F (37 C)   Resp 15   SpO2 100%   Final Clinical Impression(s) / ED Diagnoses Final diagnoses:  Chronic low back pain, unspecified back pain laterality, unspecified whether sciatica present    Rx / DC Orders ED Discharge Orders     None      5:39 PM Patient  report history of spinal cancer status post surgery as well as history of spinal cord stenosis who is here with worsening lower back pain as  well as complaints of diarrhea and then constipation.  He normally use a walker but unable to use his walker for the past 5 days requiring aide for assistance.  Due to history of cancer as well as inability to ambulate, will obtain lumbar spine MRI for further assessment, pain medication given.  Work-up initiated.  Care discussed with DR. Pfeiffer.   8:35 PM Labs are reassuring, MRI of lumbar spine demonstrate unchanged finding of moderate to severe facet arthrosis at multiple level as well as signs of neuroforaminal stenosis, unchanged from prior.  With this finding, will provide pain management but anticipate patient can follow-up outpatient for further care  9:26 PM Patient was able to use his walker to ambulate.  Encourage patient to follow-up closely with his orthopedist for outpatient management service of his recurrent lower back pain.  Return precaution given.   Domenic Moras, PA-C 04/20/21 2139    Domenic Moras, PA-C 04/20/21 2141    Charlesetta Shanks, MD 04/26/21 1026

## 2021-04-20 NOTE — Discharge Instructions (Addendum)
Please follow-up closely with your specialist for further managements of your recurrent back pain.

## 2021-04-20 NOTE — ED Triage Notes (Signed)
Pt reports history of spinal stenosis with lower back pain and difficulty ambulating.  States he got an injection 2 weeks ago and it has worn off.

## 2021-04-20 NOTE — ED Provider Notes (Signed)
Emergency Medicine Provider Triage Evaluation Note  Timothy [name] , a 65 y.o. male  was evaluated in triage.  Pt complains of bilateral lower back pain.  Patient has a history of spinal stenosis.  Patient states he received an injection in the ED 2 weeks ago that initially provided relief of his symptoms however the pain has since worn off.  Patient followed up with his primary care provider who provided him a referral to a neurologist.  Patient has not seen the neurologist.  Patient has not tried any medications at home for his symptoms.  Patient denies chest pain, shortness of breath, abdominal pain, nausea, vomiting, fever, chills  Review of Systems  Positive: Bilateral lower back pain Negative: Chest pain, shortness of breath  Physical Exam  BP 140/83 (BP Location: Left Arm)   Pulse 93   Temp 98.6 F (37 C)   Resp 14   SpO2 99%  Gen:   Awake, no distress   Resp:  Normal effort  MSK:   Moves extremities without difficulty  Other:  No C, T, L, and spinal tenderness to palpation.  No lumbar muscular tenderness to palpation.  Positive straight leg raise bilaterally.  Strength intact to bilateral lower extremities.  Medical Decision Making  Medically screening exam initiated at 2:23 PM.  Appropriate orders placed.  [name] was informed that the remainder of the evaluation will be completed by another provider, this initial triage assessment does not replace that evaluation, and the importance of remaining in the ED until their evaluation is complete.    Kasie Leccese A, PA-C 04/20/21 1424    Carmin Muskrat, MD 04/20/21 [phone]

## 2021-04-20 NOTE — ED Notes (Signed)
Pt ambulatory utilizing walker without assistance from staff. EDP aware

## 2021-05-24 ENCOUNTER — Other Ambulatory Visit: Payer: Self-pay | Admitting: Neurosurgery

## 2021-05-24 DIAGNOSIS — M47816 Spondylosis without myelopathy or radiculopathy, lumbar region: Secondary | ICD-10-CM

## 2021-05-27 ENCOUNTER — Other Ambulatory Visit: Payer: Self-pay | Admitting: Neurosurgery

## 2021-05-27 DIAGNOSIS — M47816 Spondylosis without myelopathy or radiculopathy, lumbar region: Secondary | ICD-10-CM

## 2021-05-28 ENCOUNTER — Ambulatory Visit
Admission: RE | Admit: 2021-05-28 | Discharge: 2021-05-28 | Disposition: A | Discharge status: Home / Self Care | Payer: No Typology Code available for payment source | Source: Ambulatory Visit | Attending: Neurosurgery | Admitting: Neurosurgery

## 2021-05-28 ENCOUNTER — Other Ambulatory Visit: Payer: Self-pay | Admitting: Neurosurgery

## 2021-05-28 DIAGNOSIS — M47816 Spondylosis without myelopathy or radiculopathy, lumbar region: Secondary | ICD-10-CM

## 2021-05-28 IMAGING — XA DG FACET JT INJ L OR S SPINE SINGLE LEVEL UNI
8 series · 8 of 8 positions shown · non-contrast
Comparison: none

Addendum:
CLINICAL DATA: Facet mediated low back and leg pain. Bilateral
L4-L5 and L5-S1 facet medial branch blocks requested.

EXAM:
FLUOROSCOPICALLY GUIDED BILATERAL L3 AND L4 MEDIAL BRANCH BLOCKS AND
BILATERAL L5 DORSAL RAMUS BLOCKS
FLUOROSCOPY TIME:  Radiation Exposure Index (as provided by the
fluoroscopic device): 13.4 mGy
Fluoroscopy Time:  1 minute, 24 seconds
Number of Acquired Images:  0
TECHNIQUE: The procedure, risks, benefits, and alternatives were explained to
the patient. Questions regarding the procedure were encouraged and
answered. The patient understands and consents to the procedure.

[Series 1: ortho standard · 1 of 1 slices shown (1 of 8)]
[im 1/1]
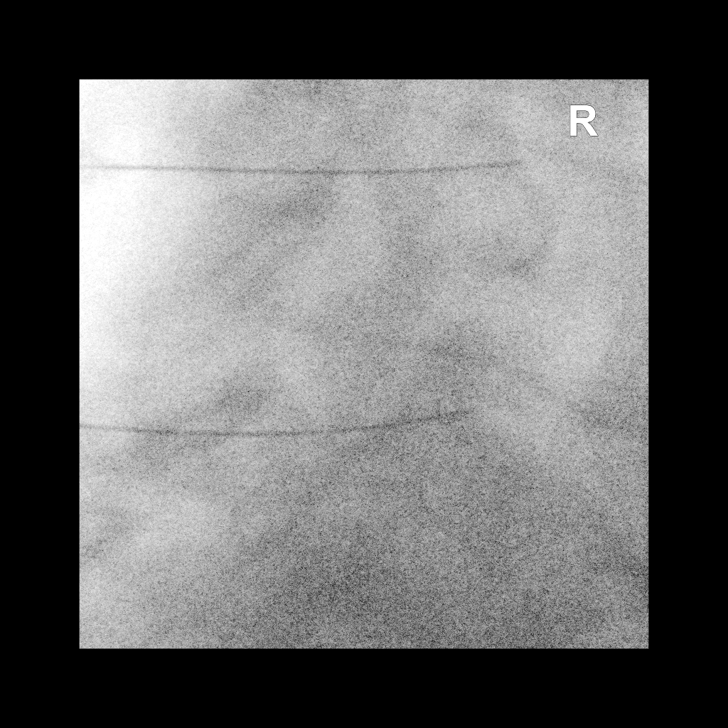

[Series 2: ortho standard · 1 of 1 slices shown (2 of 8)]
[im 1/1]
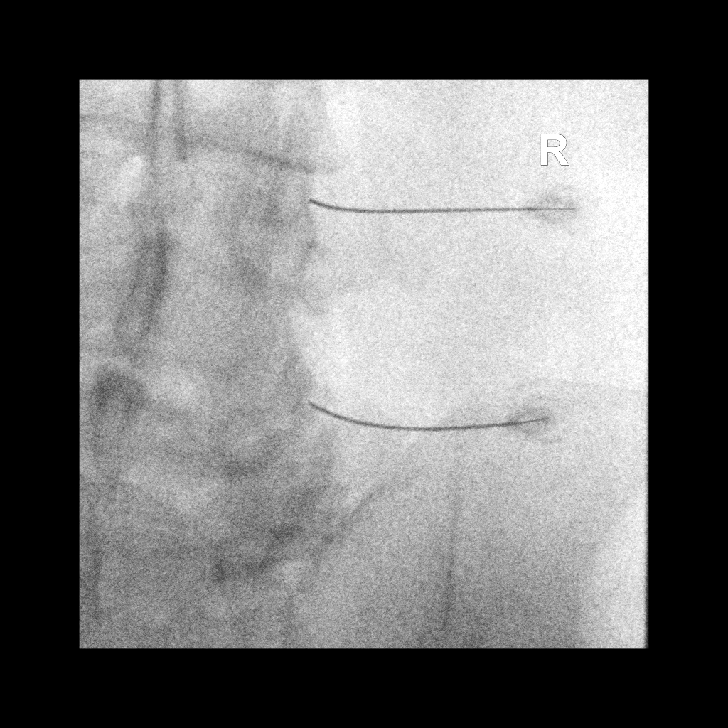

[Series 3: ortho standard · 1 of 1 slices shown (3 of 8)]
[im 1/1]
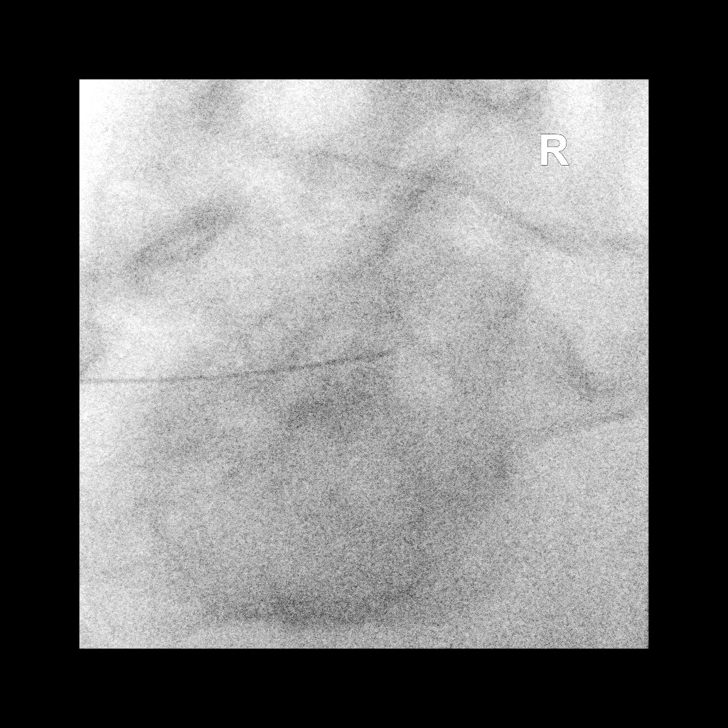

[Series 4: ortho standard · 1 of 1 slices shown (4 of 8)]
[im 1/1]
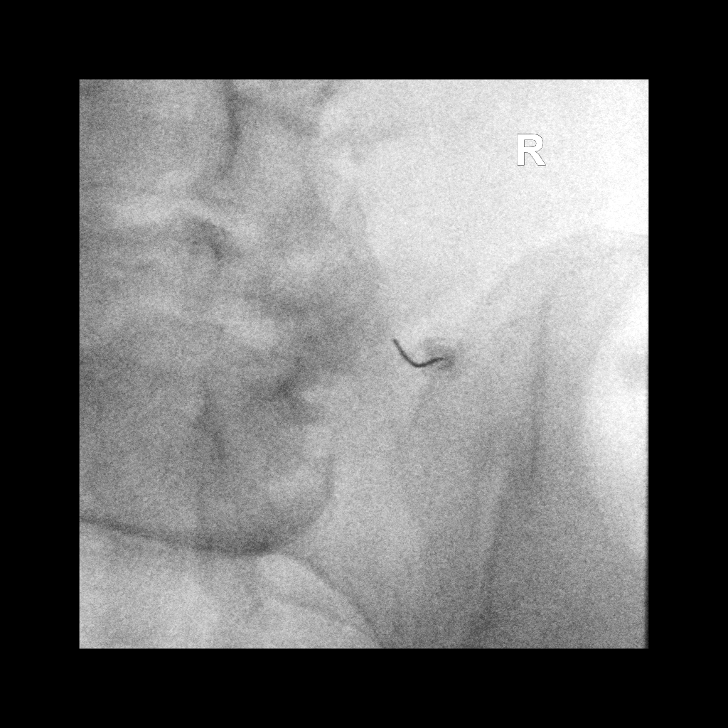

[Series 5: ortho standard · 1 of 1 slices shown (5 of 8)]
[im 1/1]
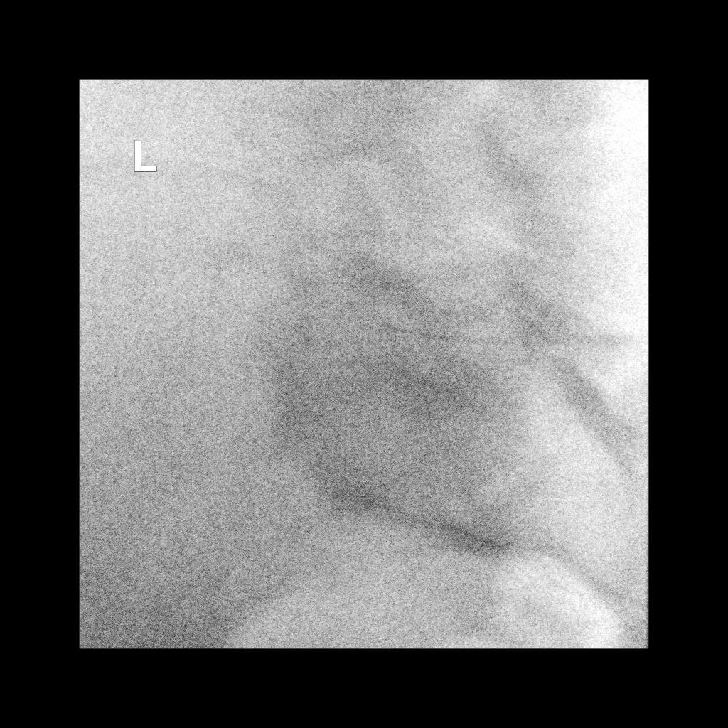

[Series 6: ortho standard · 1 of 1 slices shown (6 of 8)]
[im 1/1]
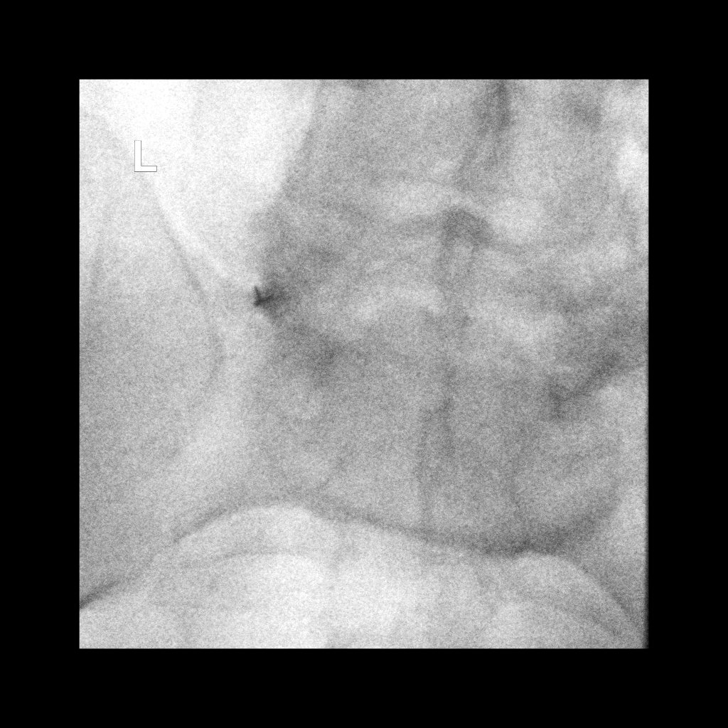

[Series 7: ortho standard · 1 of 1 slices shown (7 of 8)]
[im 1/1]
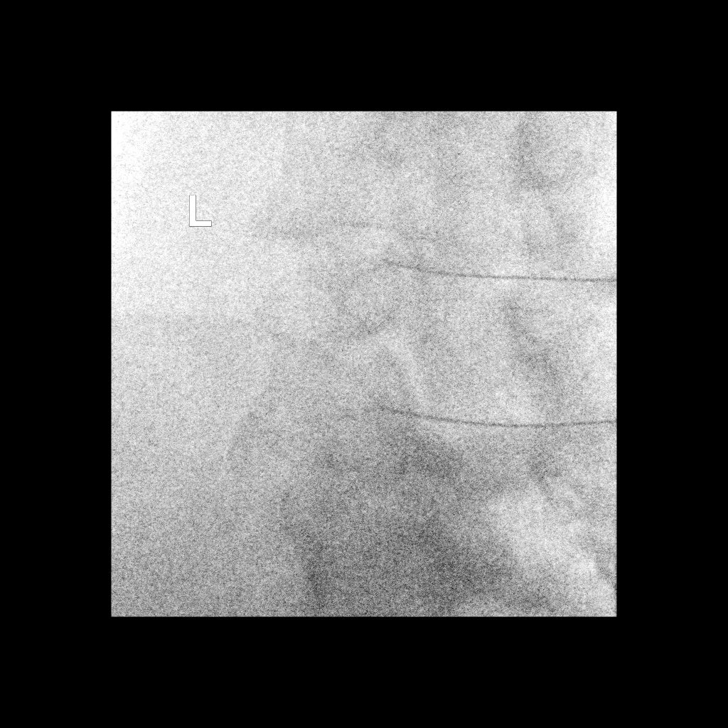

[Series 8: ortho standard · 1 of 1 slices shown (8 of 8)]
[im 1/1]
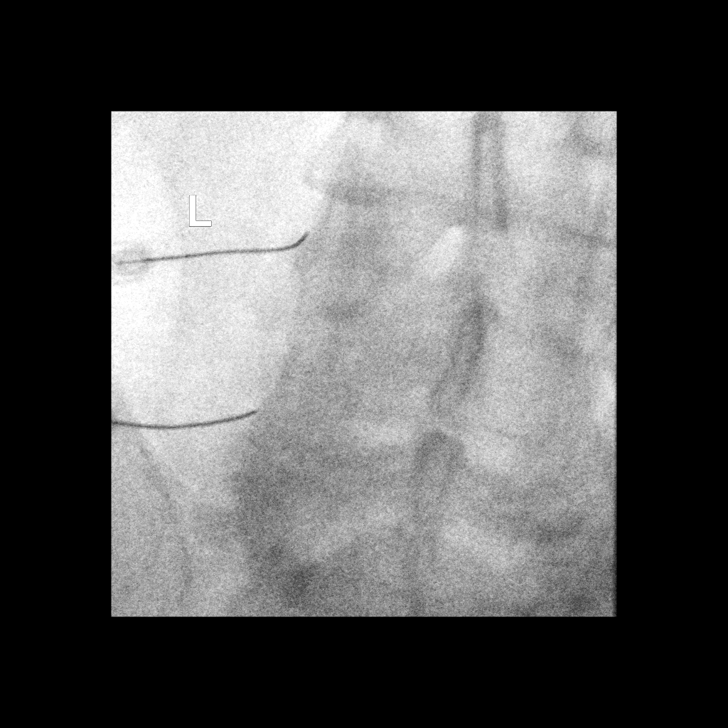

[8 of 8 positions shown; findings below may reference images not displayed]

An appropriate skin entry site was determined fluoroscopically and
marked. Site prepped with betadine, draped in usual sterile fashion,
and infiltrated locally with 1% lidocaine.

BILATERAL L3 MEDIAL BRANCH BLOCKS: A posterior oblique approach was
taken to the junction of the superior articular process and
transverse process on each side at L4 using 5 inch 22 gauge spinal
needles, to lie along the course of the bilateral L3 medial branch
nerves. 20 mg Depo-Medrol mixed with 0.75 mL of 0.5% bupivacaine
were injected at each site.

BILATERAL L4 MEDIAL BRANCH BLOCKS: A posterior oblique approach was
taken to the junction of the superior articular process and
transverse process on each side at L5 using 5 inch 22 gauge spinal
needles, to lie along the course of the bilateral L4 medial branch
nerves. 20 mg Depo-Medrol mixed with 0.75 mL of 0.5% bupivacaine
were injected at each site.

BILATERAL L5 DORSAL RAMUS BLOCKS: A posterior oblique approach was
taken to the junction of the S1 superior articular process and
sacral ala on each side using 5 inch 22 gauge spinal needles, to lie
along the course of the bilateral L5 dorsal rami. 20 mg Depo-Medrol
mixed with 0.75 mL of 0.5% bupivacaine were injected at each site.

The procedure was well-tolerated.
IMPRESSION: 1. Technically successful bilateral L4-L5 and L5-S1 facet medial
branch/dorsal ramus blocks.

ADDENDUM:
I called the patient on [DATE], 9 days after the procedure. The
patient has experienced only 40% relief in his lower back pain,
without significant improvement in his leg pain or weakness. The
patient is therefore not a good candidate for facet RFA.

*** End of Addendum ***
An appropriate skin entry site was determined fluoroscopically and
marked. Site prepped with betadine, draped in usual sterile fashion,
and infiltrated locally with 1% lidocaine.

BILATERAL L3 MEDIAL BRANCH BLOCKS: A posterior oblique approach was
taken to the junction of the superior articular process and
transverse process on each side at L4 using 5 inch 22 gauge spinal
needles, to lie along the course of the bilateral L3 medial branch
nerves. 20 mg Depo-Medrol mixed with 0.75 mL of 0.5% bupivacaine
were injected at each site.

BILATERAL L4 MEDIAL BRANCH BLOCKS: A posterior oblique approach was
taken to the junction of the superior articular process and
transverse process on each side at L5 using 5 inch 22 gauge spinal
needles, to lie along the course of the bilateral L4 medial branch
nerves. 20 mg Depo-Medrol mixed with 0.75 mL of 0.5% bupivacaine
were injected at each site.

BILATERAL L5 DORSAL RAMUS BLOCKS: A posterior oblique approach was
taken to the junction of the S1 superior articular process and
sacral ala on each side using 5 inch 22 gauge spinal needles, to lie
along the course of the bilateral L5 dorsal rami. 20 mg Depo-Medrol
mixed with 0.75 mL of 0.5% bupivacaine were injected at each site.

The procedure was well-tolerated.
IMPRESSION: 1. Technically successful bilateral L4-L5 and L5-S1 facet medial
branch/dorsal ramus blocks.

## 2021-05-28 IMAGING — XA DG FACET JT INJ L OR S SPINE SECOND LEVEL UNILATERAL
8 series · 8 of 8 positions shown · non-contrast
Comparison: none

Addendum:
CLINICAL DATA: Facet mediated low back and leg pain. Bilateral
L4-L5 and L5-S1 facet medial branch blocks requested.

EXAM:
FLUOROSCOPICALLY GUIDED BILATERAL L3 AND L4 MEDIAL BRANCH BLOCKS AND
BILATERAL L5 DORSAL RAMUS BLOCKS
FLUOROSCOPY TIME:  Radiation Exposure Index (as provided by the
fluoroscopic device): 13.4 mGy
Fluoroscopy Time:  1 minute, 24 seconds
Number of Acquired Images:  0
TECHNIQUE: The procedure, risks, benefits, and alternatives were explained to
the patient. Questions regarding the procedure were encouraged and
answered. The patient understands and consents to the procedure.

[Series 1: ortho standard · 1 of 1 slices shown (1 of 8)]
[im 1/1]
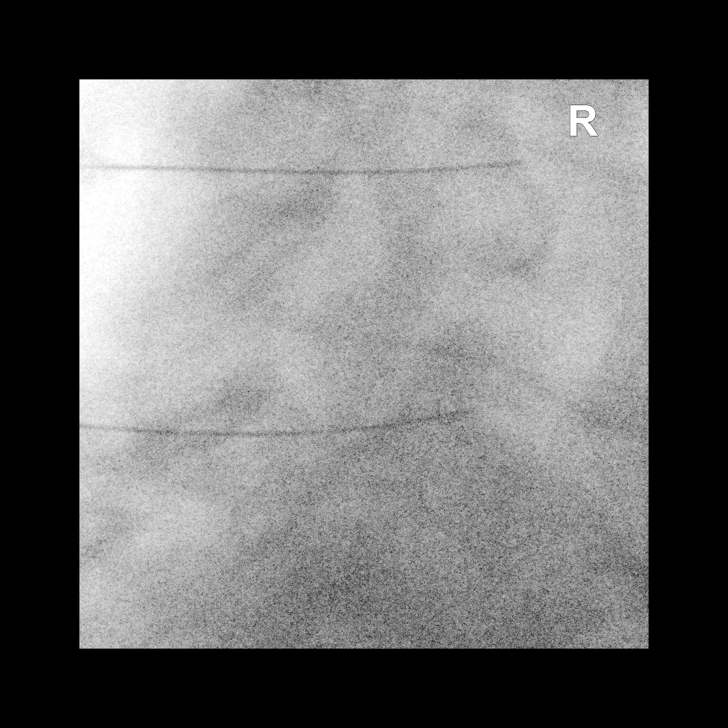

[Series 2: ortho standard · 1 of 1 slices shown (2 of 8)]
[im 1/1]
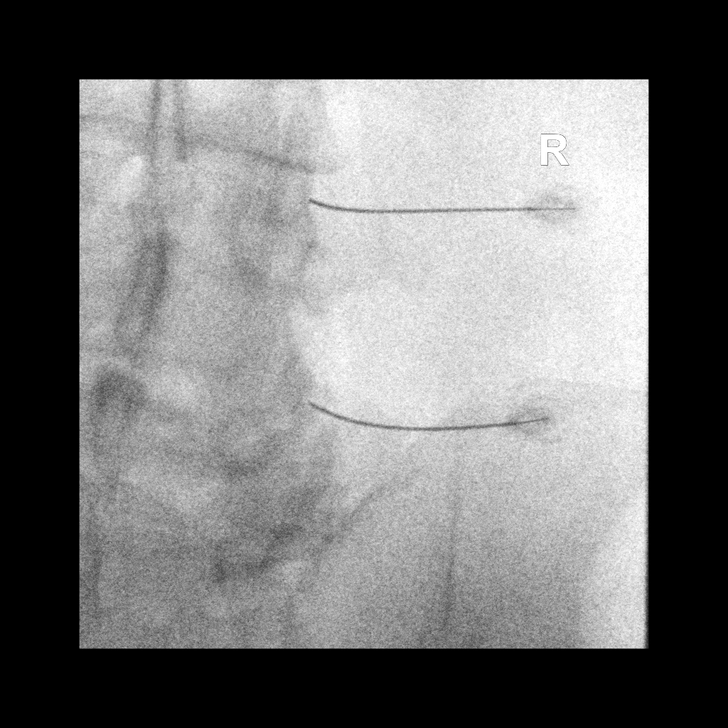

[Series 3: ortho standard · 1 of 1 slices shown (3 of 8)]
[im 1/1]
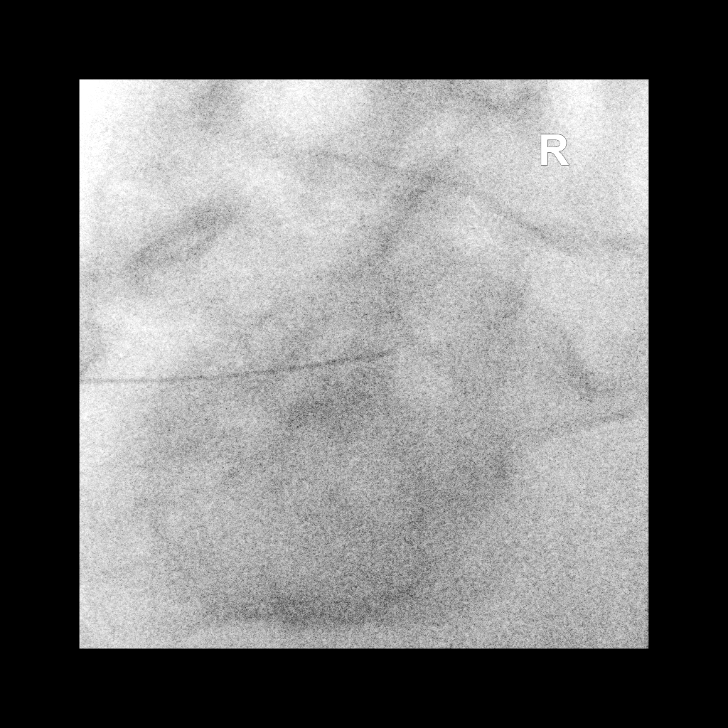

[Series 4: ortho standard · 1 of 1 slices shown (4 of 8)]
[im 1/1]
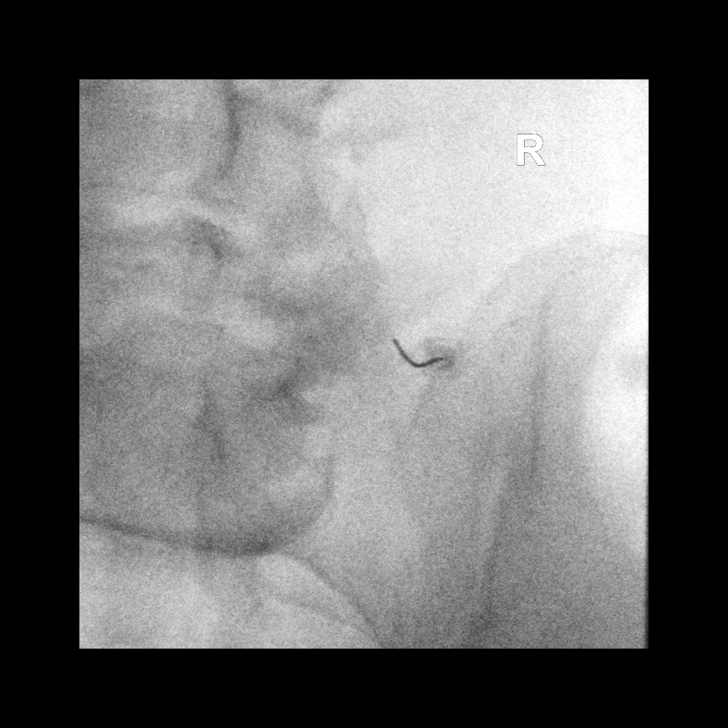

[Series 5: ortho standard · 1 of 1 slices shown (5 of 8)]
[im 1/1]
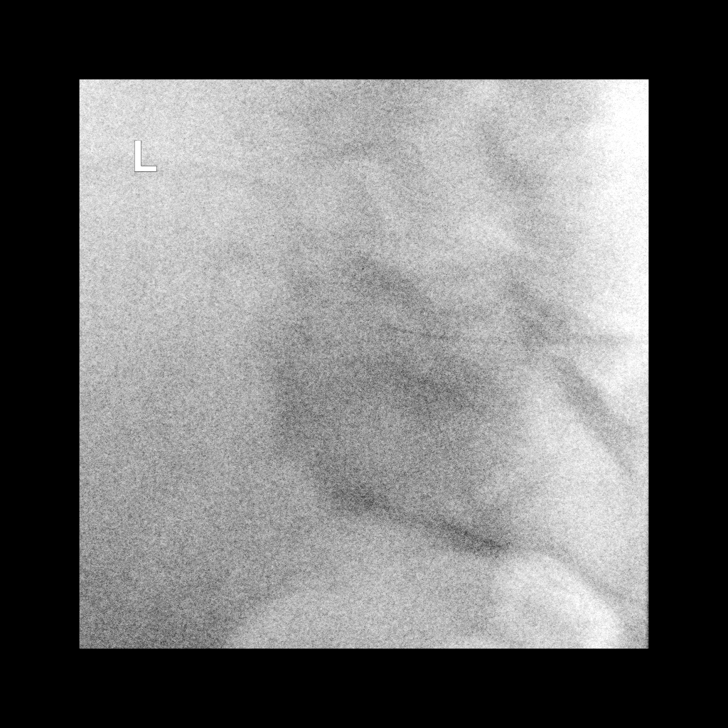

[Series 6: ortho standard · 1 of 1 slices shown (6 of 8)]
[im 1/1]
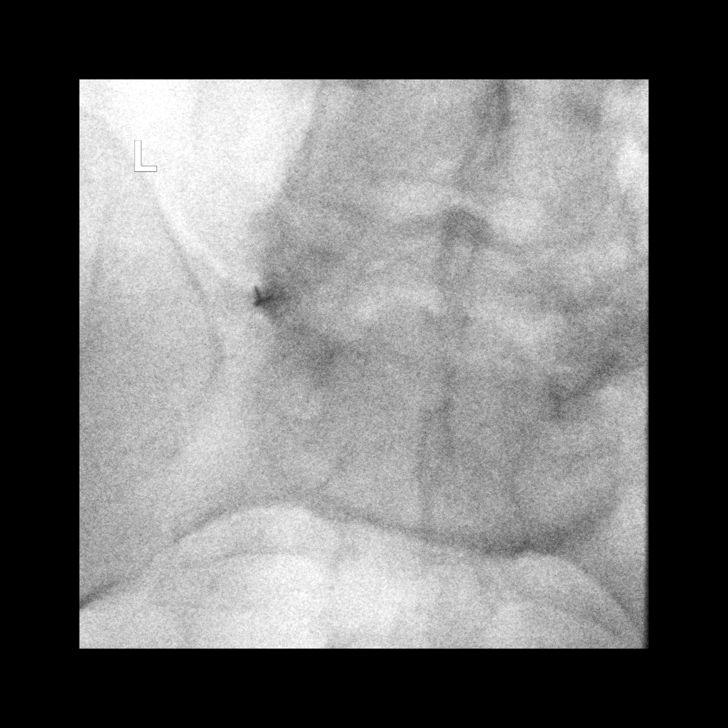

[Series 7: ortho standard · 1 of 1 slices shown (7 of 8)]
[im 1/1]
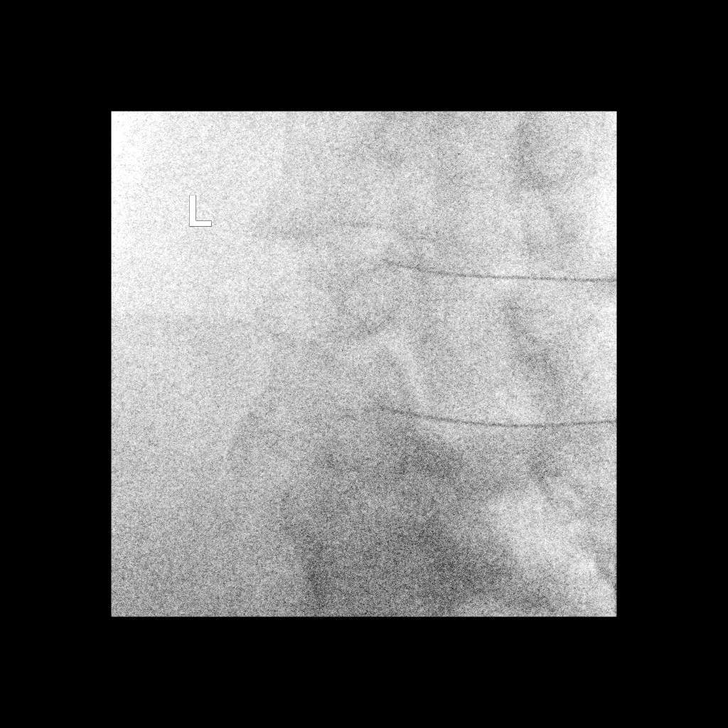

[Series 8: ortho standard · 1 of 1 slices shown (8 of 8)]
[im 1/1]
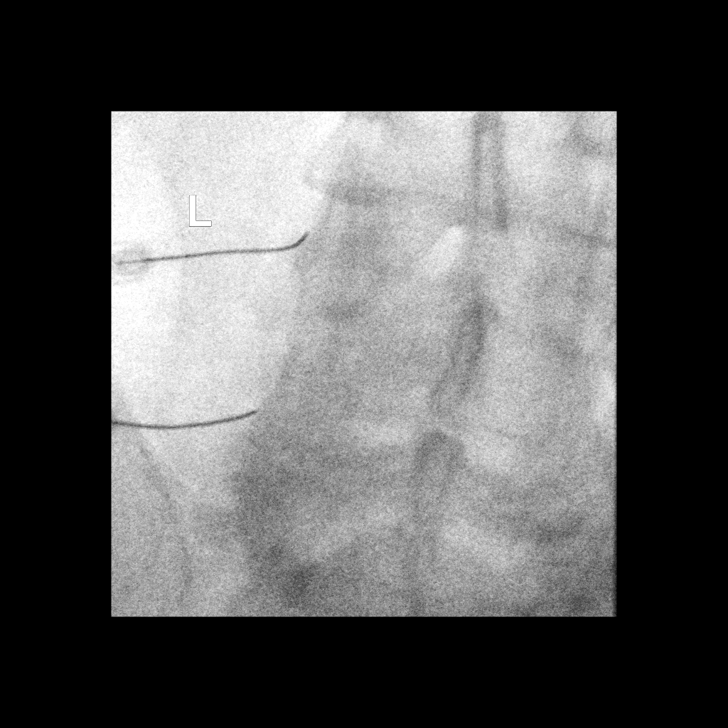

[8 of 8 positions shown; findings below may reference images not displayed]

An appropriate skin entry site was determined fluoroscopically and
marked. Site prepped with betadine, draped in usual sterile fashion,
and infiltrated locally with 1% lidocaine.

BILATERAL L3 MEDIAL BRANCH BLOCKS: A posterior oblique approach was
taken to the junction of the superior articular process and
transverse process on each side at L4 using 5 inch 22 gauge spinal
needles, to lie along the course of the bilateral L3 medial branch
nerves. 20 mg Depo-Medrol mixed with 0.75 mL of 0.5% bupivacaine
were injected at each site.

BILATERAL L4 MEDIAL BRANCH BLOCKS: A posterior oblique approach was
taken to the junction of the superior articular process and
transverse process on each side at L5 using 5 inch 22 gauge spinal
needles, to lie along the course of the bilateral L4 medial branch
nerves. 20 mg Depo-Medrol mixed with 0.75 mL of 0.5% bupivacaine
were injected at each site.

BILATERAL L5 DORSAL RAMUS BLOCKS: A posterior oblique approach was
taken to the junction of the S1 superior articular process and
sacral ala on each side using 5 inch 22 gauge spinal needles, to lie
along the course of the bilateral L5 dorsal rami. 20 mg Depo-Medrol
mixed with 0.75 mL of 0.5% bupivacaine were injected at each site.

The procedure was well-tolerated.
IMPRESSION: 1. Technically successful bilateral L4-L5 and L5-S1 facet medial
branch/dorsal ramus blocks.

ADDENDUM:
I called the patient on [DATE], 9 days after the procedure. The
patient has experienced only 40% relief in his lower back pain,
without significant improvement in his leg pain or weakness. The
patient is therefore not a good candidate for facet RFA.

*** End of Addendum ***
An appropriate skin entry site was determined fluoroscopically and
marked. Site prepped with betadine, draped in usual sterile fashion,
and infiltrated locally with 1% lidocaine.

BILATERAL L3 MEDIAL BRANCH BLOCKS: A posterior oblique approach was
taken to the junction of the superior articular process and
transverse process on each side at L4 using 5 inch 22 gauge spinal
needles, to lie along the course of the bilateral L3 medial branch
nerves. 20 mg Depo-Medrol mixed with 0.75 mL of 0.5% bupivacaine
were injected at each site.

BILATERAL L4 MEDIAL BRANCH BLOCKS: A posterior oblique approach was
taken to the junction of the superior articular process and
transverse process on each side at L5 using 5 inch 22 gauge spinal
needles, to lie along the course of the bilateral L4 medial branch
nerves. 20 mg Depo-Medrol mixed with 0.75 mL of 0.5% bupivacaine
were injected at each site.

BILATERAL L5 DORSAL RAMUS BLOCKS: A posterior oblique approach was
taken to the junction of the S1 superior articular process and
sacral ala on each side using 5 inch 22 gauge spinal needles, to lie
along the course of the bilateral L5 dorsal rami. 20 mg Depo-Medrol
mixed with 0.75 mL of 0.5% bupivacaine were injected at each site.

The procedure was well-tolerated.
IMPRESSION: 1. Technically successful bilateral L4-L5 and L5-S1 facet medial
branch/dorsal ramus blocks.

## 2021-05-28 IMAGING — XA DG FACET JT INJ L OR S SPINE SINGLE LEVEL UNI
8 series · 8 of 8 positions shown · non-contrast
Comparison: none

Addendum:
CLINICAL DATA: Facet mediated low back and leg pain. Bilateral
L4-L5 and L5-S1 facet medial branch blocks requested.

EXAM:
FLUOROSCOPICALLY GUIDED BILATERAL L3 AND L4 MEDIAL BRANCH BLOCKS AND
BILATERAL L5 DORSAL RAMUS BLOCKS
FLUOROSCOPY TIME:  Radiation Exposure Index (as provided by the
fluoroscopic device): 13.4 mGy
Fluoroscopy Time:  1 minute, 24 seconds
Number of Acquired Images:  0
TECHNIQUE: The procedure, risks, benefits, and alternatives were explained to
the patient. Questions regarding the procedure were encouraged and
answered. The patient understands and consents to the procedure.

[Series 1: ortho standard · 1 of 1 slices shown (1 of 8)]
[im 1/1]
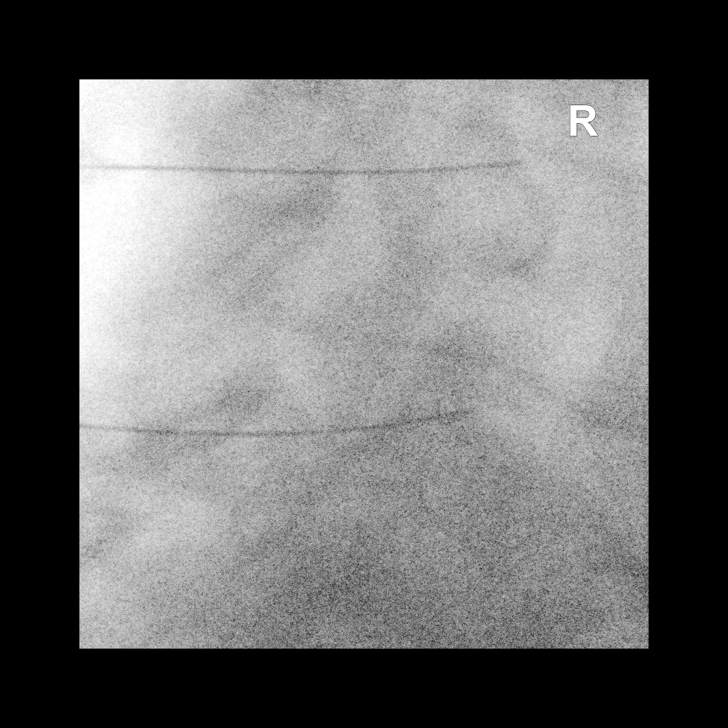

[Series 2: ortho standard · 1 of 1 slices shown (2 of 8)]
[im 1/1]
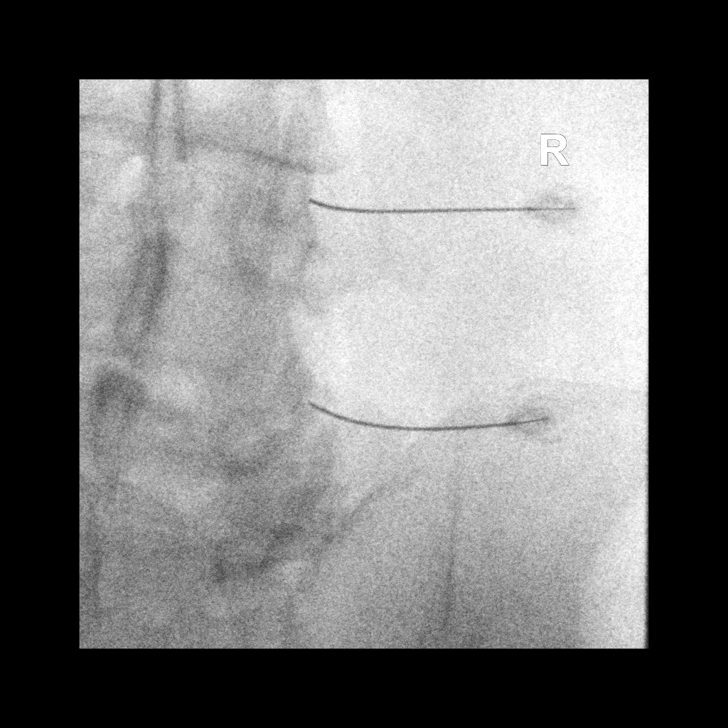

[Series 3: ortho standard · 1 of 1 slices shown (3 of 8)]
[im 1/1]
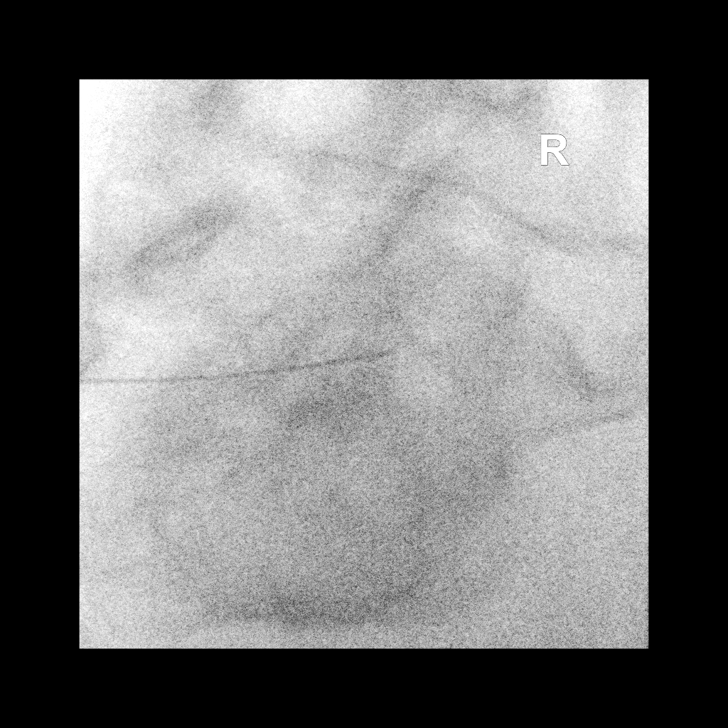

[Series 4: ortho standard · 1 of 1 slices shown (4 of 8)]
[im 1/1]
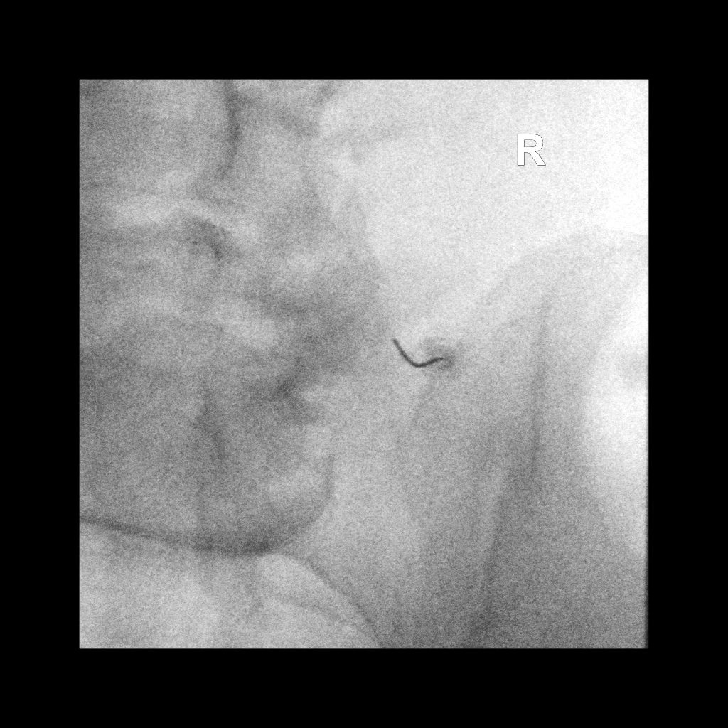

[Series 5: ortho standard · 1 of 1 slices shown (5 of 8)]
[im 1/1]
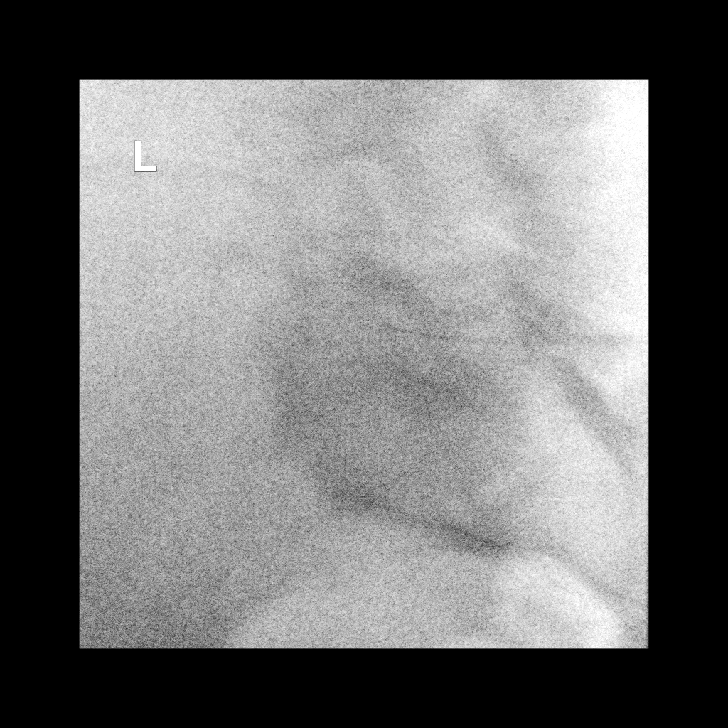

[Series 6: ortho standard · 1 of 1 slices shown (6 of 8)]
[im 1/1]
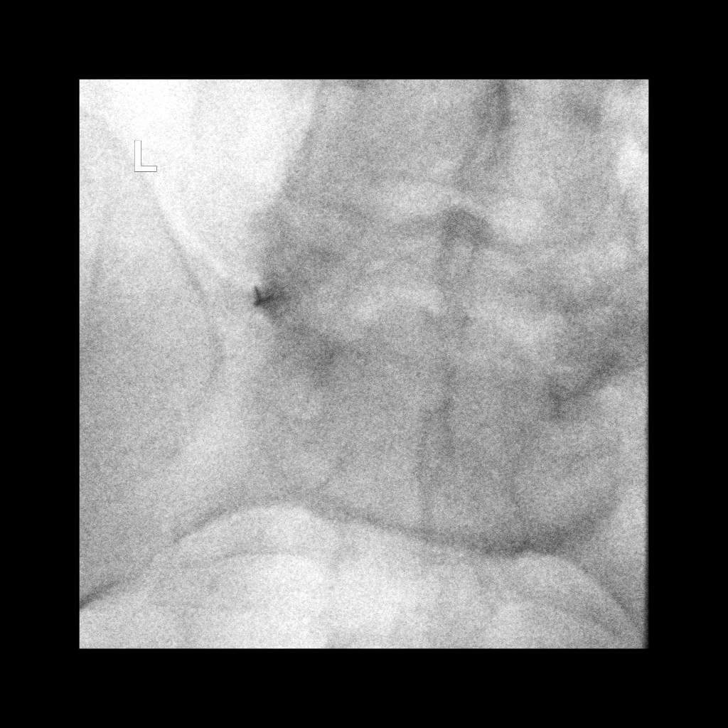

[Series 7: ortho standard · 1 of 1 slices shown (7 of 8)]
[im 1/1]
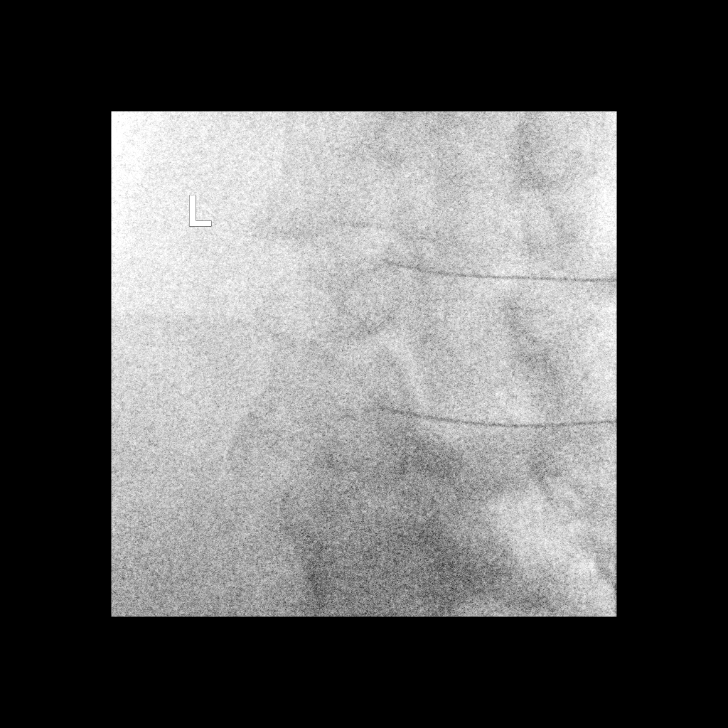

[Series 8: ortho standard · 1 of 1 slices shown (8 of 8)]
[im 1/1]
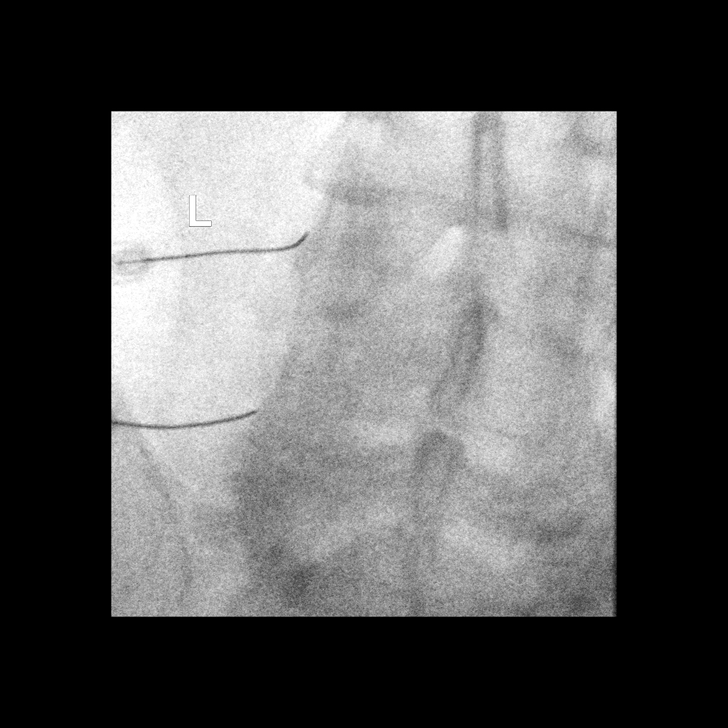

[8 of 8 positions shown; findings below may reference images not displayed]

An appropriate skin entry site was determined fluoroscopically and
marked. Site prepped with betadine, draped in usual sterile fashion,
and infiltrated locally with 1% lidocaine.

BILATERAL L3 MEDIAL BRANCH BLOCKS: A posterior oblique approach was
taken to the junction of the superior articular process and
transverse process on each side at L4 using 5 inch 22 gauge spinal
needles, to lie along the course of the bilateral L3 medial branch
nerves. 20 mg Depo-Medrol mixed with 0.75 mL of 0.5% bupivacaine
were injected at each site.

BILATERAL L4 MEDIAL BRANCH BLOCKS: A posterior oblique approach was
taken to the junction of the superior articular process and
transverse process on each side at L5 using 5 inch 22 gauge spinal
needles, to lie along the course of the bilateral L4 medial branch
nerves. 20 mg Depo-Medrol mixed with 0.75 mL of 0.5% bupivacaine
were injected at each site.

BILATERAL L5 DORSAL RAMUS BLOCKS: A posterior oblique approach was
taken to the junction of the S1 superior articular process and
sacral ala on each side using 5 inch 22 gauge spinal needles, to lie
along the course of the bilateral L5 dorsal rami. 20 mg Depo-Medrol
mixed with 0.75 mL of 0.5% bupivacaine were injected at each site.

The procedure was well-tolerated.
IMPRESSION: 1. Technically successful bilateral L4-L5 and L5-S1 facet medial
branch/dorsal ramus blocks.

ADDENDUM:
I called the patient on [DATE], 9 days after the procedure. The
patient has experienced only 40% relief in his lower back pain,
without significant improvement in his leg pain or weakness. The
patient is therefore not a good candidate for facet RFA.

*** End of Addendum ***
An appropriate skin entry site was determined fluoroscopically and
marked. Site prepped with betadine, draped in usual sterile fashion,
and infiltrated locally with 1% lidocaine.

BILATERAL L3 MEDIAL BRANCH BLOCKS: A posterior oblique approach was
taken to the junction of the superior articular process and
transverse process on each side at L4 using 5 inch 22 gauge spinal
needles, to lie along the course of the bilateral L3 medial branch
nerves. 20 mg Depo-Medrol mixed with 0.75 mL of 0.5% bupivacaine
were injected at each site.

BILATERAL L4 MEDIAL BRANCH BLOCKS: A posterior oblique approach was
taken to the junction of the superior articular process and
transverse process on each side at L5 using 5 inch 22 gauge spinal
needles, to lie along the course of the bilateral L4 medial branch
nerves. 20 mg Depo-Medrol mixed with 0.75 mL of 0.5% bupivacaine
were injected at each site.

BILATERAL L5 DORSAL RAMUS BLOCKS: A posterior oblique approach was
taken to the junction of the S1 superior articular process and
sacral ala on each side using 5 inch 22 gauge spinal needles, to lie
along the course of the bilateral L5 dorsal rami. 20 mg Depo-Medrol
mixed with 0.75 mL of 0.5% bupivacaine were injected at each site.

The procedure was well-tolerated.
IMPRESSION: 1. Technically successful bilateral L4-L5 and L5-S1 facet medial
branch/dorsal ramus blocks.

## 2021-05-28 IMAGING — XA DG FACET JT INJ L OR S SPINE 2ND LEVEL *R*
8 series · 8 of 8 positions shown · non-contrast
Comparison: none

Addendum:
CLINICAL DATA: Facet mediated low back and leg pain. Bilateral
L4-L5 and L5-S1 facet medial branch blocks requested.

EXAM:
FLUOROSCOPICALLY GUIDED BILATERAL L3 AND L4 MEDIAL BRANCH BLOCKS AND
BILATERAL L5 DORSAL RAMUS BLOCKS
FLUOROSCOPY TIME:  Radiation Exposure Index (as provided by the
fluoroscopic device): 13.4 mGy
Fluoroscopy Time:  1 minute, 24 seconds
Number of Acquired Images:  0
TECHNIQUE: The procedure, risks, benefits, and alternatives were explained to
the patient. Questions regarding the procedure were encouraged and
answered. The patient understands and consents to the procedure.

[Series 1: ortho standard · 1 of 1 slices shown (1 of 8)]
[im 1/1]
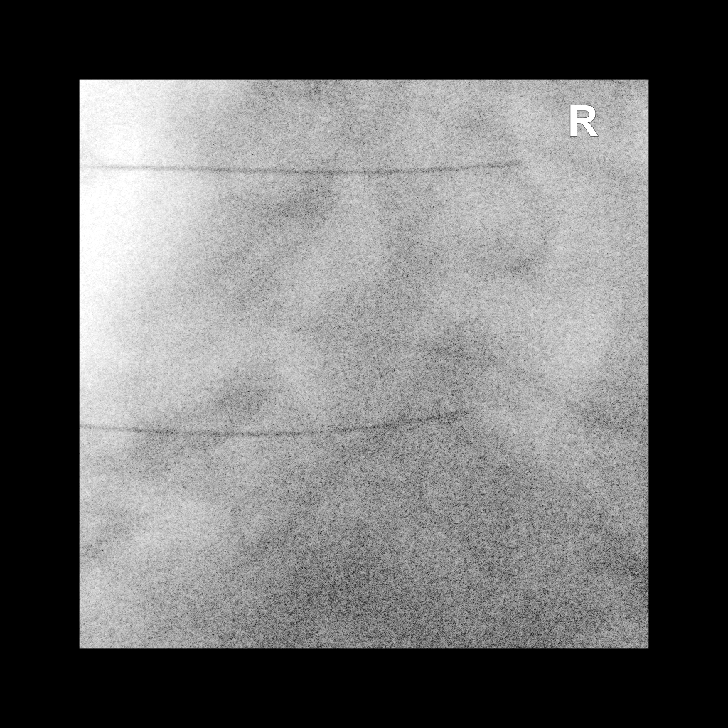

[Series 2: ortho standard · 1 of 1 slices shown (2 of 8)]
[im 1/1]
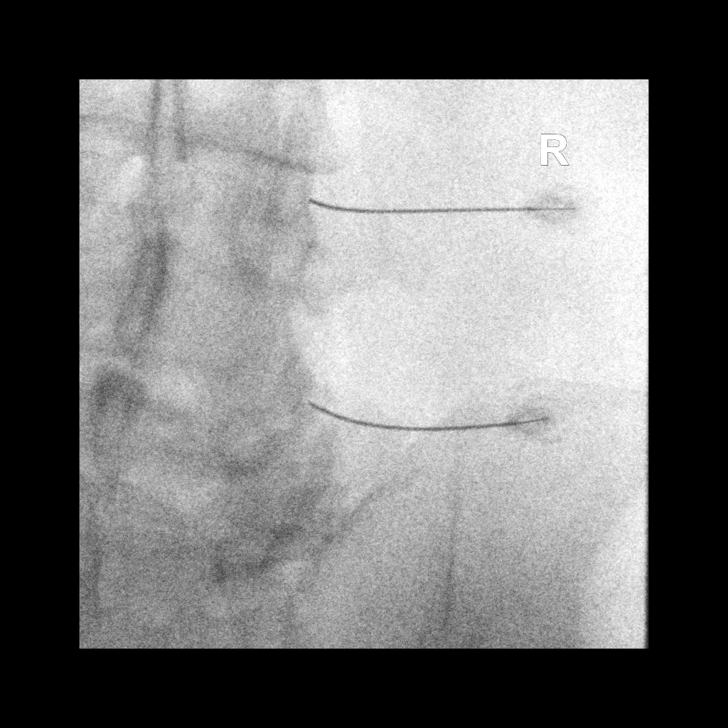

[Series 3: ortho standard · 1 of 1 slices shown (3 of 8)]
[im 1/1]
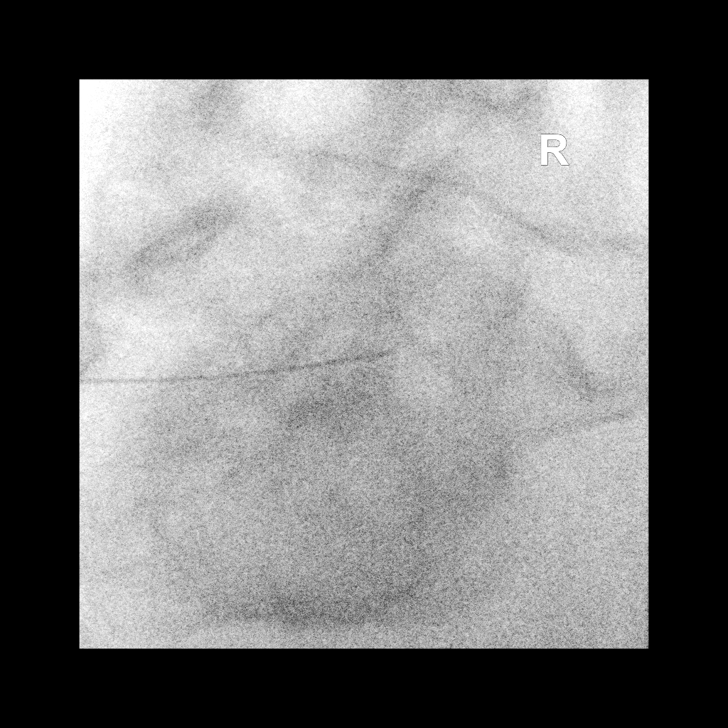

[Series 4: ortho standard · 1 of 1 slices shown (4 of 8)]
[im 1/1]
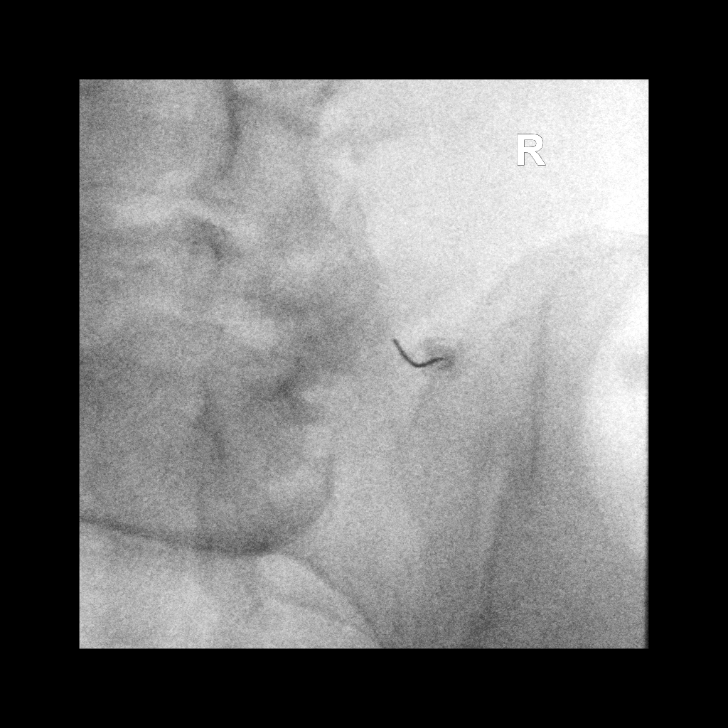

[Series 5: ortho standard · 1 of 1 slices shown (5 of 8)]
[im 1/1]
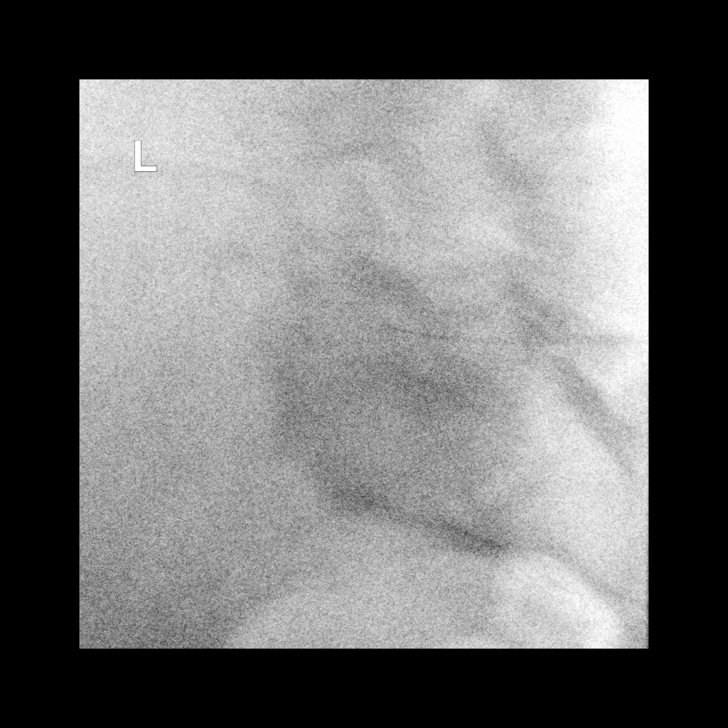

[Series 6: ortho standard · 1 of 1 slices shown (6 of 8)]
[im 1/1]
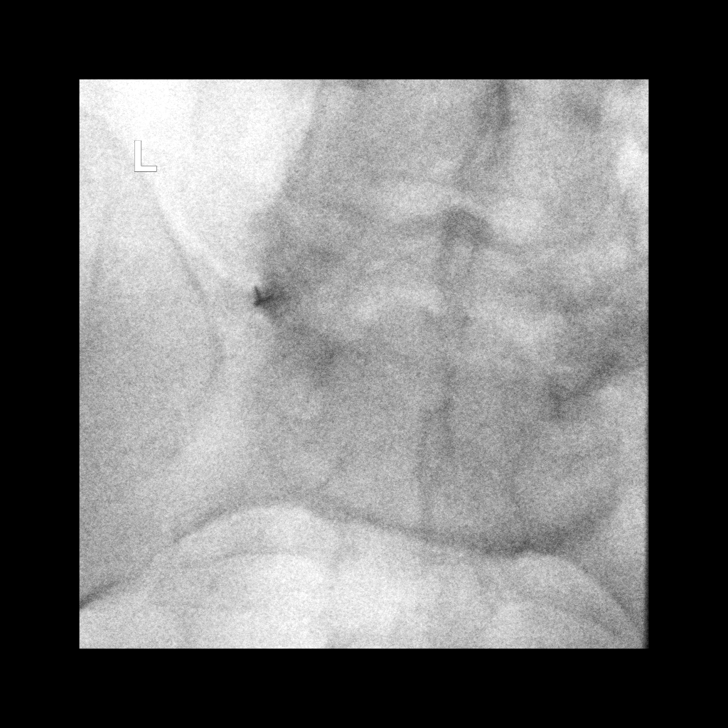

[Series 7: ortho standard · 1 of 1 slices shown (7 of 8)]
[im 1/1]
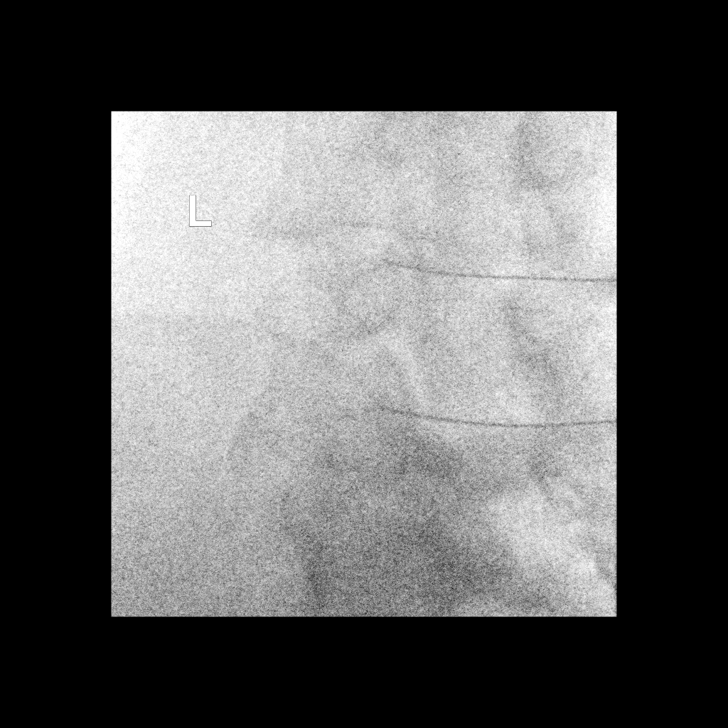

[Series 8: ortho standard · 1 of 1 slices shown (8 of 8)]
[im 1/1]
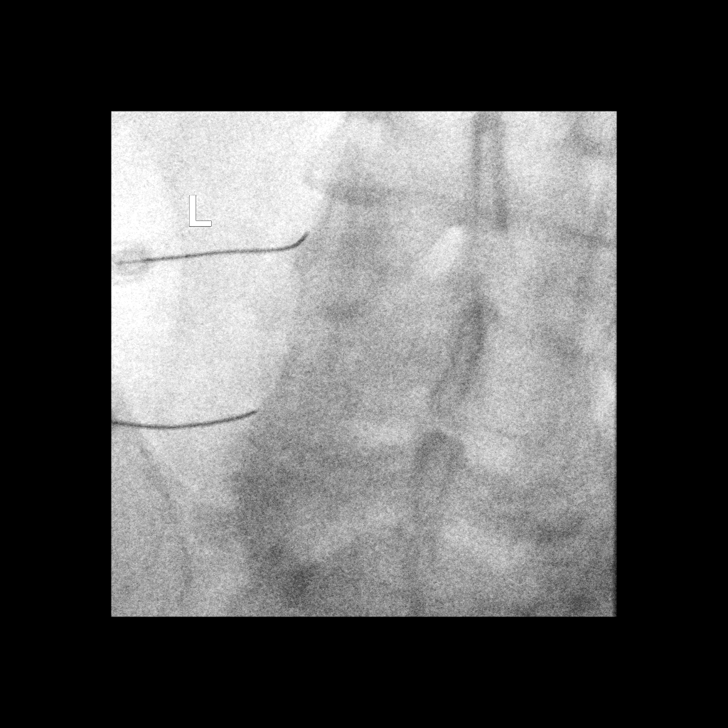

[8 of 8 positions shown; findings below may reference images not displayed]

An appropriate skin entry site was determined fluoroscopically and
marked. Site prepped with betadine, draped in usual sterile fashion,
and infiltrated locally with 1% lidocaine.

BILATERAL L3 MEDIAL BRANCH BLOCKS: A posterior oblique approach was
taken to the junction of the superior articular process and
transverse process on each side at L4 using 5 inch 22 gauge spinal
needles, to lie along the course of the bilateral L3 medial branch
nerves. 20 mg Depo-Medrol mixed with 0.75 mL of 0.5% bupivacaine
were injected at each site.

BILATERAL L4 MEDIAL BRANCH BLOCKS: A posterior oblique approach was
taken to the junction of the superior articular process and
transverse process on each side at L5 using 5 inch 22 gauge spinal
needles, to lie along the course of the bilateral L4 medial branch
nerves. 20 mg Depo-Medrol mixed with 0.75 mL of 0.5% bupivacaine
were injected at each site.

BILATERAL L5 DORSAL RAMUS BLOCKS: A posterior oblique approach was
taken to the junction of the S1 superior articular process and
sacral ala on each side using 5 inch 22 gauge spinal needles, to lie
along the course of the bilateral L5 dorsal rami. 20 mg Depo-Medrol
mixed with 0.75 mL of 0.5% bupivacaine were injected at each site.

The procedure was well-tolerated.
IMPRESSION: 1. Technically successful bilateral L4-L5 and L5-S1 facet medial
branch/dorsal ramus blocks.

ADDENDUM:
I called the patient on [DATE], 9 days after the procedure. The
patient has experienced only 40% relief in his lower back pain,
without significant improvement in his leg pain or weakness. The
patient is therefore not a good candidate for facet RFA.

*** End of Addendum ***
An appropriate skin entry site was determined fluoroscopically and
marked. Site prepped with betadine, draped in usual sterile fashion,
and infiltrated locally with 1% lidocaine.

BILATERAL L3 MEDIAL BRANCH BLOCKS: A posterior oblique approach was
taken to the junction of the superior articular process and
transverse process on each side at L4 using 5 inch 22 gauge spinal
needles, to lie along the course of the bilateral L3 medial branch
nerves. 20 mg Depo-Medrol mixed with 0.75 mL of 0.5% bupivacaine
were injected at each site.

BILATERAL L4 MEDIAL BRANCH BLOCKS: A posterior oblique approach was
taken to the junction of the superior articular process and
transverse process on each side at L5 using 5 inch 22 gauge spinal
needles, to lie along the course of the bilateral L4 medial branch
nerves. 20 mg Depo-Medrol mixed with 0.75 mL of 0.5% bupivacaine
were injected at each site.

BILATERAL L5 DORSAL RAMUS BLOCKS: A posterior oblique approach was
taken to the junction of the S1 superior articular process and
sacral ala on each side using 5 inch 22 gauge spinal needles, to lie
along the course of the bilateral L5 dorsal rami. 20 mg Depo-Medrol
mixed with 0.75 mL of 0.5% bupivacaine were injected at each site.

The procedure was well-tolerated.
IMPRESSION: 1. Technically successful bilateral L4-L5 and L5-S1 facet medial
branch/dorsal ramus blocks.

## 2021-05-28 IMAGING — XA DG FACET JT INJ L OR S SPINE 2ND LEVEL *R*
8 series · 8 of 8 positions shown · non-contrast
Comparison: none

Addendum:
CLINICAL DATA: Facet mediated low back and leg pain. Bilateral
L4-L5 and L5-S1 facet medial branch blocks requested.

EXAM:
FLUOROSCOPICALLY GUIDED BILATERAL L3 AND L4 MEDIAL BRANCH BLOCKS AND
BILATERAL L5 DORSAL RAMUS BLOCKS
FLUOROSCOPY TIME:  Radiation Exposure Index (as provided by the
fluoroscopic device): 13.4 mGy
Fluoroscopy Time:  1 minute, 24 seconds
Number of Acquired Images:  0
TECHNIQUE: The procedure, risks, benefits, and alternatives were explained to
the patient. Questions regarding the procedure were encouraged and
answered. The patient understands and consents to the procedure.

[Series 1: ortho standard · 1 of 1 slices shown (1 of 8)]
[im 1/1]
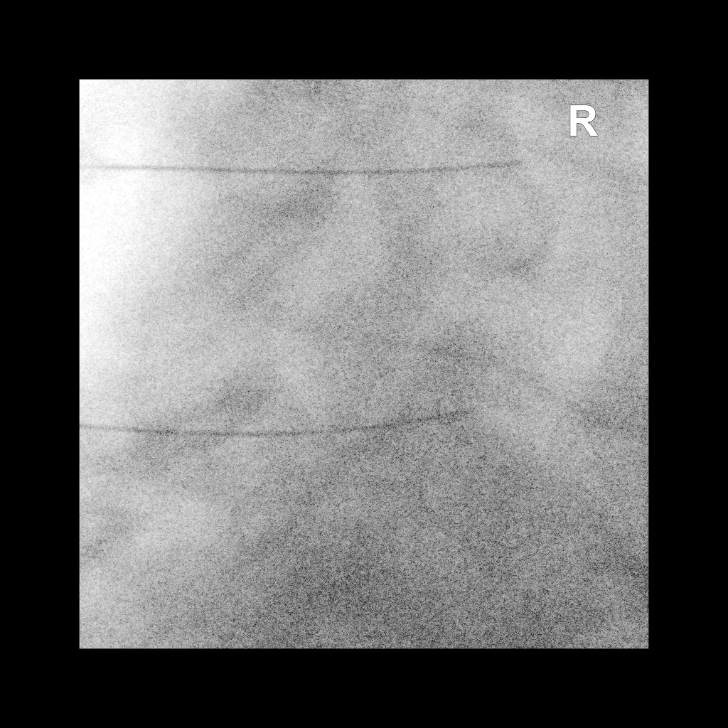

[Series 2: ortho standard · 1 of 1 slices shown (2 of 8)]
[im 1/1]
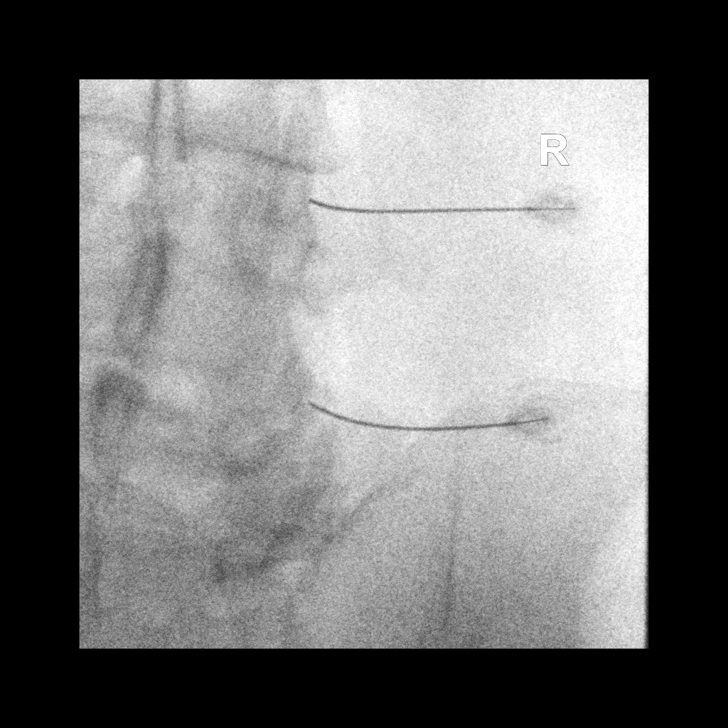

[Series 3: ortho standard · 1 of 1 slices shown (3 of 8)]
[im 1/1]
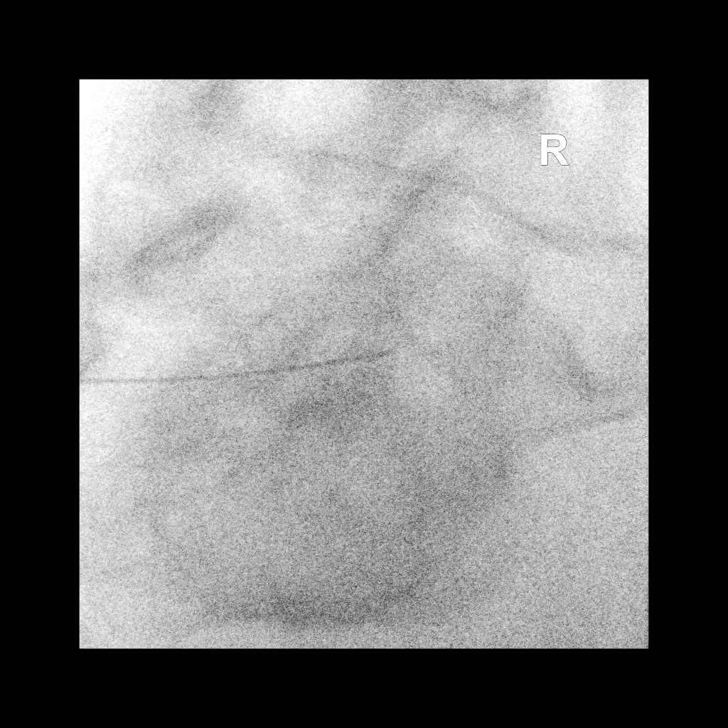

[Series 4: ortho standard · 1 of 1 slices shown (4 of 8)]
[im 1/1]
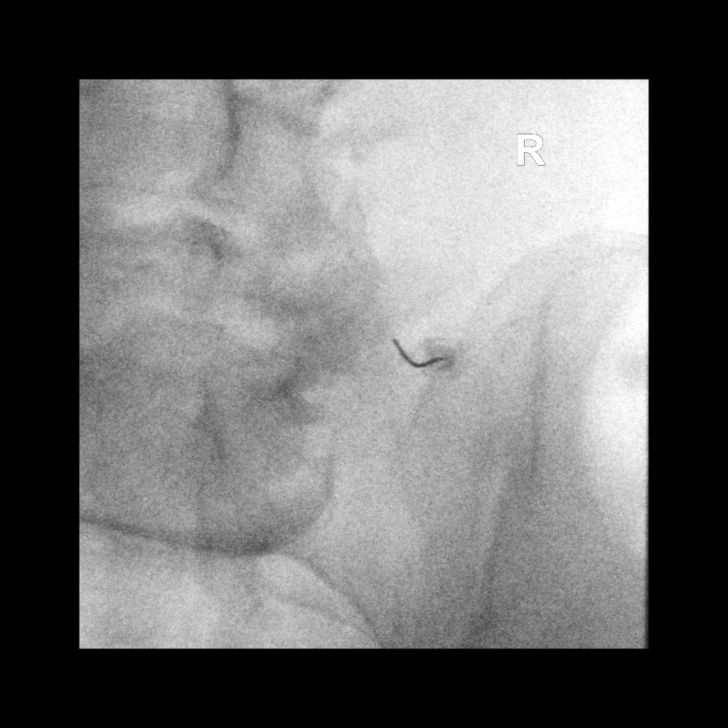

[Series 5: ortho standard · 1 of 1 slices shown (5 of 8)]
[im 1/1]
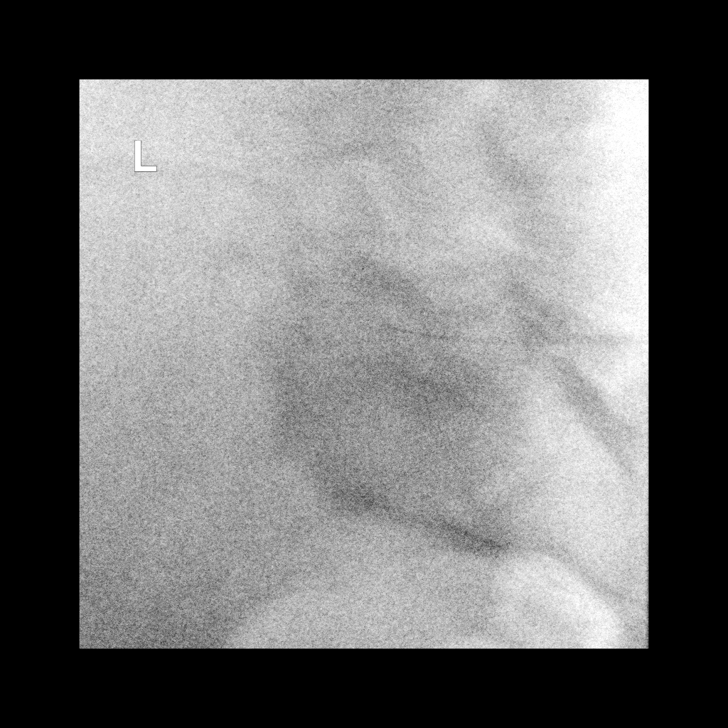

[Series 6: ortho standard · 1 of 1 slices shown (6 of 8)]
[im 1/1]
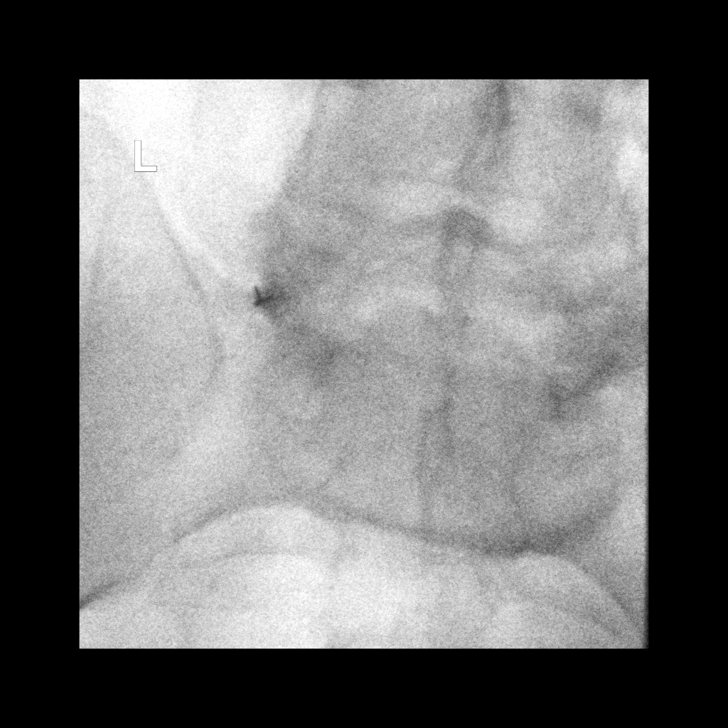

[Series 7: ortho standard · 1 of 1 slices shown (7 of 8)]
[im 1/1]
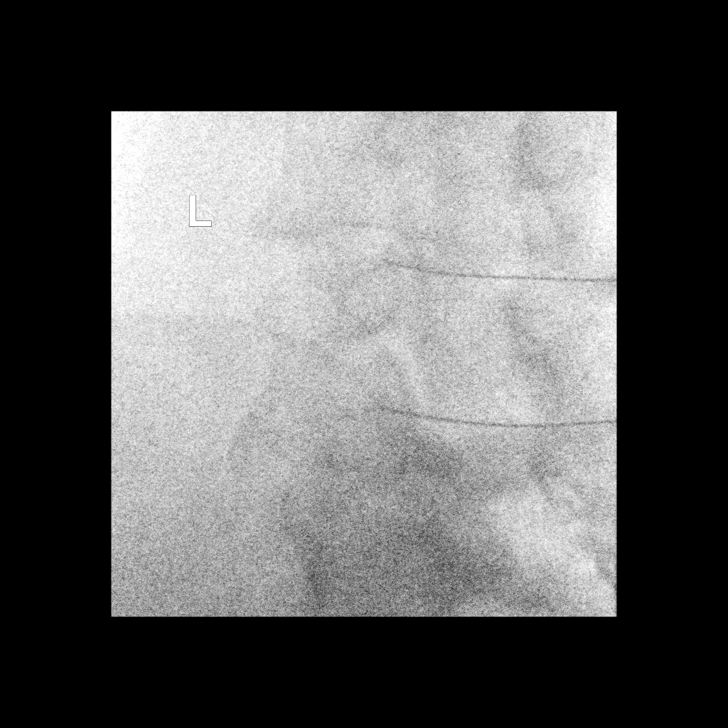

[Series 8: ortho standard · 1 of 1 slices shown (8 of 8)]
[im 1/1]
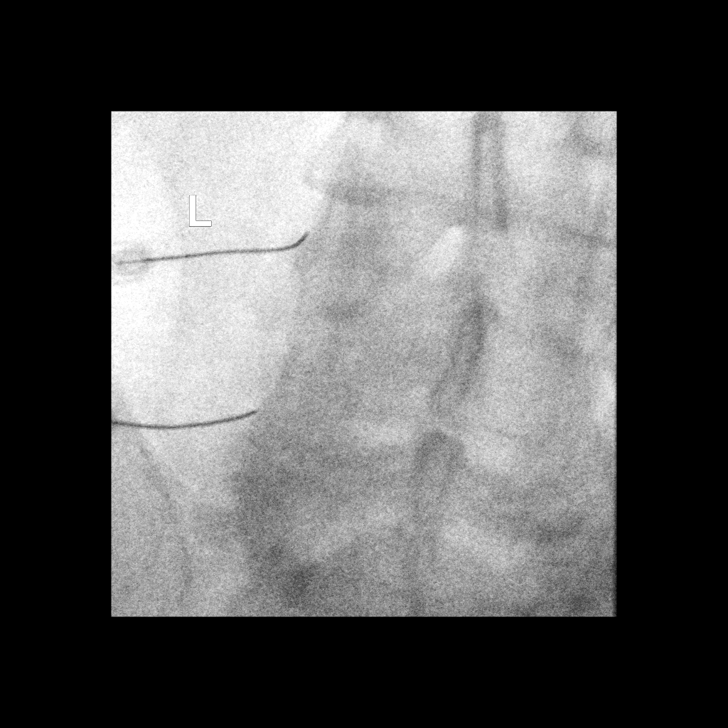

[8 of 8 positions shown; findings below may reference images not displayed]

An appropriate skin entry site was determined fluoroscopically and
marked. Site prepped with betadine, draped in usual sterile fashion,
and infiltrated locally with 1% lidocaine.

BILATERAL L3 MEDIAL BRANCH BLOCKS: A posterior oblique approach was
taken to the junction of the superior articular process and
transverse process on each side at L4 using 5 inch 22 gauge spinal
needles, to lie along the course of the bilateral L3 medial branch
nerves. 20 mg Depo-Medrol mixed with 0.75 mL of 0.5% bupivacaine
were injected at each site.

BILATERAL L4 MEDIAL BRANCH BLOCKS: A posterior oblique approach was
taken to the junction of the superior articular process and
transverse process on each side at L5 using 5 inch 22 gauge spinal
needles, to lie along the course of the bilateral L4 medial branch
nerves. 20 mg Depo-Medrol mixed with 0.75 mL of 0.5% bupivacaine
were injected at each site.

BILATERAL L5 DORSAL RAMUS BLOCKS: A posterior oblique approach was
taken to the junction of the S1 superior articular process and
sacral ala on each side using 5 inch 22 gauge spinal needles, to lie
along the course of the bilateral L5 dorsal rami. 20 mg Depo-Medrol
mixed with 0.75 mL of 0.5% bupivacaine were injected at each site.

The procedure was well-tolerated.
IMPRESSION: 1. Technically successful bilateral L4-L5 and L5-S1 facet medial
branch/dorsal ramus blocks.

ADDENDUM:
I called the patient on [DATE], 9 days after the procedure. The
patient has experienced only 40% relief in his lower back pain,
without significant improvement in his leg pain or weakness. The
patient is therefore not a good candidate for facet RFA.

*** End of Addendum ***
An appropriate skin entry site was determined fluoroscopically and
marked. Site prepped with betadine, draped in usual sterile fashion,
and infiltrated locally with 1% lidocaine.

BILATERAL L3 MEDIAL BRANCH BLOCKS: A posterior oblique approach was
taken to the junction of the superior articular process and
transverse process on each side at L4 using 5 inch 22 gauge spinal
needles, to lie along the course of the bilateral L3 medial branch
nerves. 20 mg Depo-Medrol mixed with 0.75 mL of 0.5% bupivacaine
were injected at each site.

BILATERAL L4 MEDIAL BRANCH BLOCKS: A posterior oblique approach was
taken to the junction of the superior articular process and
transverse process on each side at L5 using 5 inch 22 gauge spinal
needles, to lie along the course of the bilateral L4 medial branch
nerves. 20 mg Depo-Medrol mixed with 0.75 mL of 0.5% bupivacaine
were injected at each site.

BILATERAL L5 DORSAL RAMUS BLOCKS: A posterior oblique approach was
taken to the junction of the S1 superior articular process and
sacral ala on each side using 5 inch 22 gauge spinal needles, to lie
along the course of the bilateral L5 dorsal rami. 20 mg Depo-Medrol
mixed with 0.75 mL of 0.5% bupivacaine were injected at each site.

The procedure was well-tolerated.
IMPRESSION: 1. Technically successful bilateral L4-L5 and L5-S1 facet medial
branch/dorsal ramus blocks.

## 2021-05-28 IMAGING — XA DG FACET JT INJ L OR S SPINE SECOND LEVEL UNILATERAL
8 series · 8 of 8 positions shown · non-contrast
Comparison: none

Addendum:
CLINICAL DATA: Facet mediated low back and leg pain. Bilateral
L4-L5 and L5-S1 facet medial branch blocks requested.

EXAM:
FLUOROSCOPICALLY GUIDED BILATERAL L3 AND L4 MEDIAL BRANCH BLOCKS AND
BILATERAL L5 DORSAL RAMUS BLOCKS
FLUOROSCOPY TIME:  Radiation Exposure Index (as provided by the
fluoroscopic device): 13.4 mGy
Fluoroscopy Time:  1 minute, 24 seconds
Number of Acquired Images:  0
TECHNIQUE: The procedure, risks, benefits, and alternatives were explained to
the patient. Questions regarding the procedure were encouraged and
answered. The patient understands and consents to the procedure.

[Series 1: ortho standard · 1 of 1 slices shown (1 of 8)]
[im 1/1]
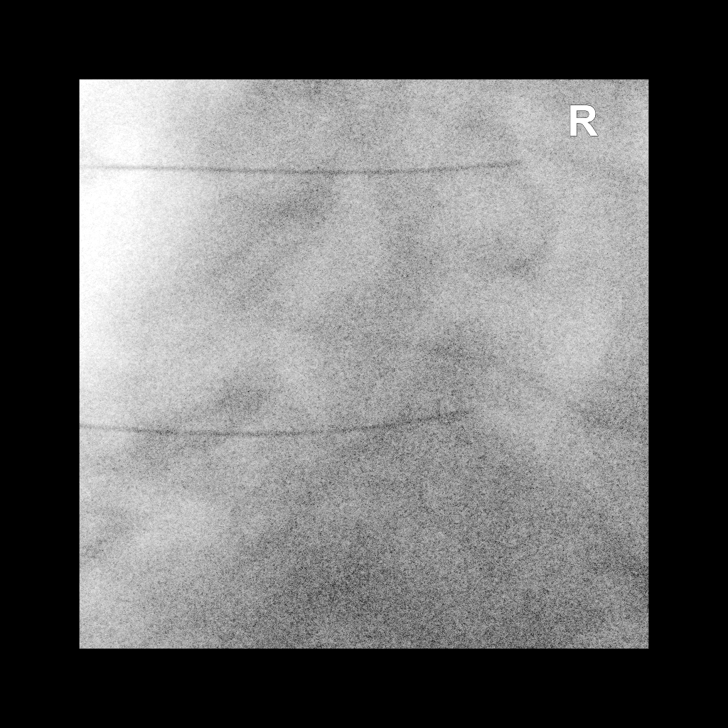

[Series 2: ortho standard · 1 of 1 slices shown (2 of 8)]
[im 1/1]
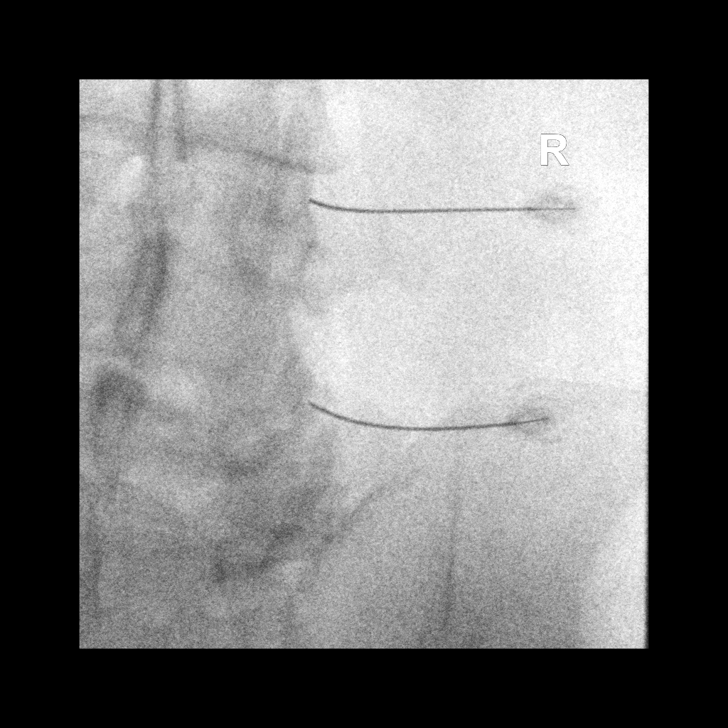

[Series 3: ortho standard · 1 of 1 slices shown (3 of 8)]
[im 1/1]
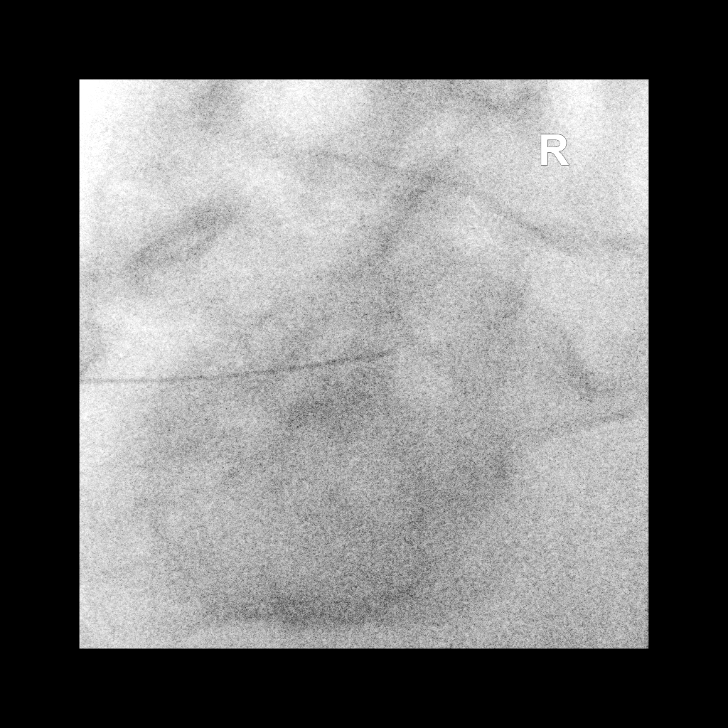

[Series 4: ortho standard · 1 of 1 slices shown (4 of 8)]
[im 1/1]
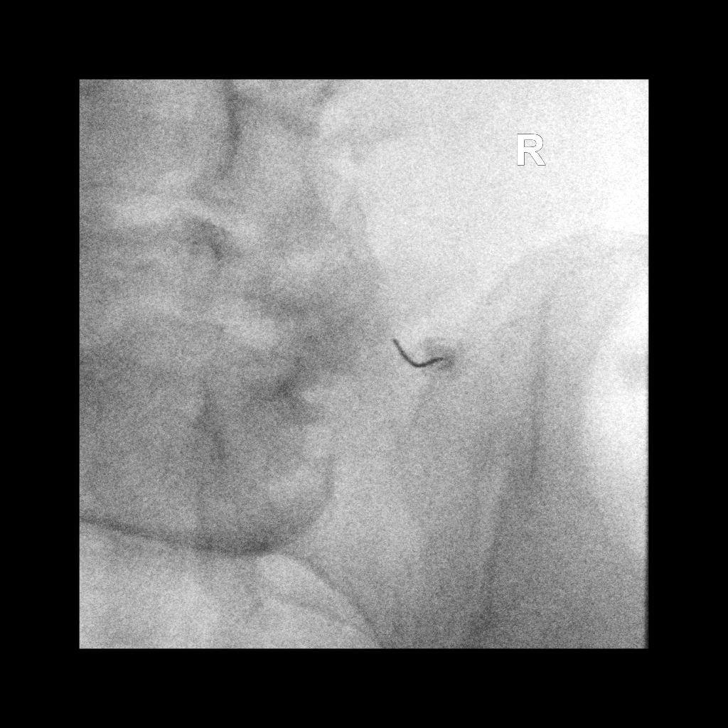

[Series 5: ortho standard · 1 of 1 slices shown (5 of 8)]
[im 1/1]
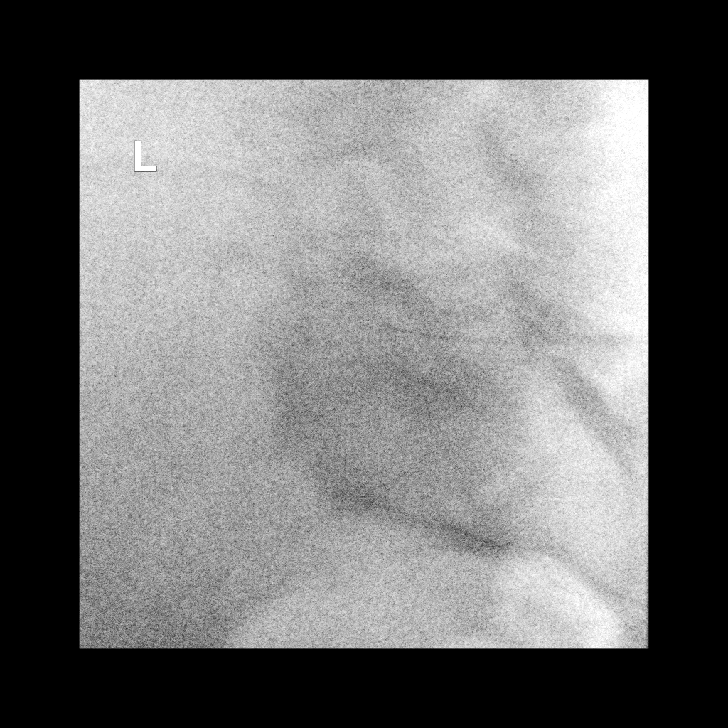

[Series 6: ortho standard · 1 of 1 slices shown (6 of 8)]
[im 1/1]
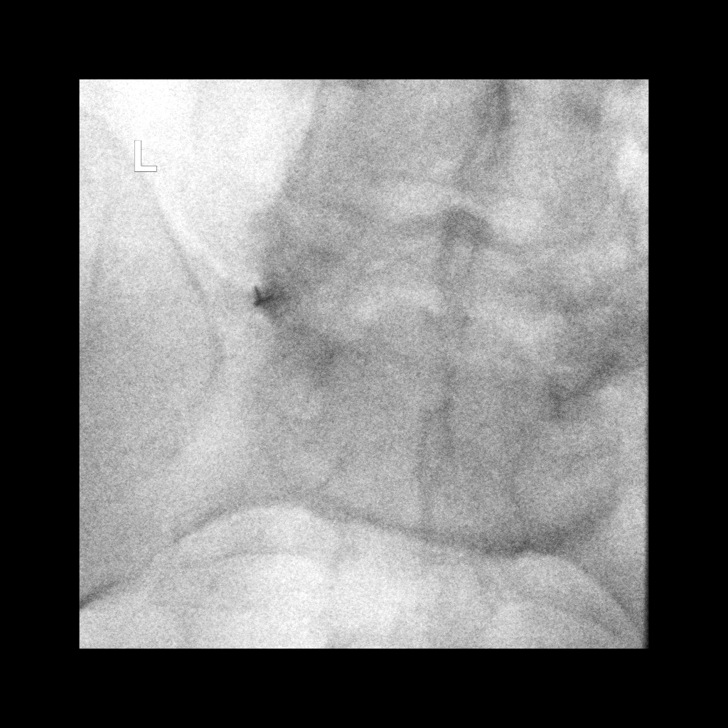

[Series 7: ortho standard · 1 of 1 slices shown (7 of 8)]
[im 1/1]
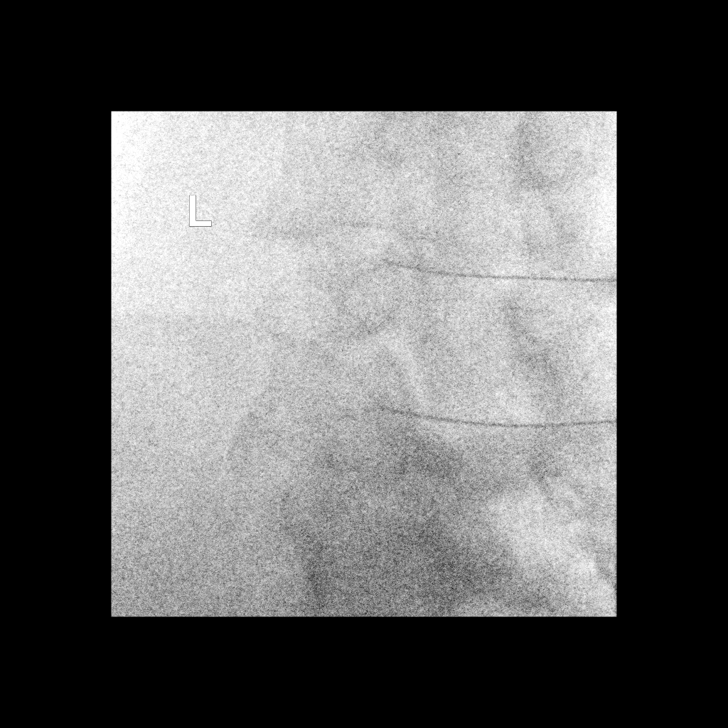

[Series 8: ortho standard · 1 of 1 slices shown (8 of 8)]
[im 1/1]
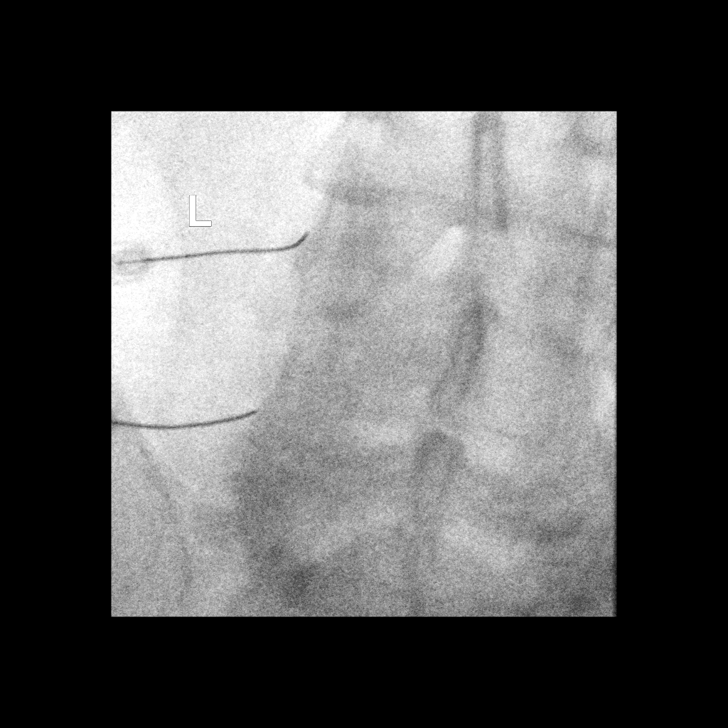

[8 of 8 positions shown; findings below may reference images not displayed]

An appropriate skin entry site was determined fluoroscopically and
marked. Site prepped with betadine, draped in usual sterile fashion,
and infiltrated locally with 1% lidocaine.

BILATERAL L3 MEDIAL BRANCH BLOCKS: A posterior oblique approach was
taken to the junction of the superior articular process and
transverse process on each side at L4 using 5 inch 22 gauge spinal
needles, to lie along the course of the bilateral L3 medial branch
nerves. 20 mg Depo-Medrol mixed with 0.75 mL of 0.5% bupivacaine
were injected at each site.

BILATERAL L4 MEDIAL BRANCH BLOCKS: A posterior oblique approach was
taken to the junction of the superior articular process and
transverse process on each side at L5 using 5 inch 22 gauge spinal
needles, to lie along the course of the bilateral L4 medial branch
nerves. 20 mg Depo-Medrol mixed with 0.75 mL of 0.5% bupivacaine
were injected at each site.

BILATERAL L5 DORSAL RAMUS BLOCKS: A posterior oblique approach was
taken to the junction of the S1 superior articular process and
sacral ala on each side using 5 inch 22 gauge spinal needles, to lie
along the course of the bilateral L5 dorsal rami. 20 mg Depo-Medrol
mixed with 0.75 mL of 0.5% bupivacaine were injected at each site.

The procedure was well-tolerated.
IMPRESSION: 1. Technically successful bilateral L4-L5 and L5-S1 facet medial
branch/dorsal ramus blocks.

ADDENDUM:
I called the patient on [DATE], 9 days after the procedure. The
patient has experienced only 40% relief in his lower back pain,
without significant improvement in his leg pain or weakness. The
patient is therefore not a good candidate for facet RFA.

*** End of Addendum ***
An appropriate skin entry site was determined fluoroscopically and
marked. Site prepped with betadine, draped in usual sterile fashion,
and infiltrated locally with 1% lidocaine.

BILATERAL L3 MEDIAL BRANCH BLOCKS: A posterior oblique approach was
taken to the junction of the superior articular process and
transverse process on each side at L4 using 5 inch 22 gauge spinal
needles, to lie along the course of the bilateral L3 medial branch
nerves. 20 mg Depo-Medrol mixed with 0.75 mL of 0.5% bupivacaine
were injected at each site.

BILATERAL L4 MEDIAL BRANCH BLOCKS: A posterior oblique approach was
taken to the junction of the superior articular process and
transverse process on each side at L5 using 5 inch 22 gauge spinal
needles, to lie along the course of the bilateral L4 medial branch
nerves. 20 mg Depo-Medrol mixed with 0.75 mL of 0.5% bupivacaine
were injected at each site.

BILATERAL L5 DORSAL RAMUS BLOCKS: A posterior oblique approach was
taken to the junction of the S1 superior articular process and
sacral ala on each side using 5 inch 22 gauge spinal needles, to lie
along the course of the bilateral L5 dorsal rami. 20 mg Depo-Medrol
mixed with 0.75 mL of 0.5% bupivacaine were injected at each site.

The procedure was well-tolerated.
IMPRESSION: 1. Technically successful bilateral L4-L5 and L5-S1 facet medial
branch/dorsal ramus blocks.

## 2021-05-28 MED ORDER — METHYLPREDNISOLONE ACETATE 40 MG/ML INJ SUSP (RADIOLOG
120.0000 mg | Freq: Once | INTRAMUSCULAR | Status: AC
  Administered 2021-05-28: 120 mg via EPIDURAL

## 2021-05-28 NOTE — Discharge Instructions (Signed)

## 2021-05-29 ENCOUNTER — Ambulatory Visit: Payer: Self-pay | Admitting: *Deleted

## 2021-05-29 NOTE — Telephone Encounter (Signed)
°  Chief Complaint: Pain in back after procedure Symptoms: pain Frequency: constant Pertinent Negatives: Patient denies swelling, fever numbness Disposition: [] ED /[] Urgent Care (no appt availability in office) / [] Appointment(In office/virtual)/ []  [facility]/ [] Home Care/ [] Refused Recommended Disposition /[] [facility]/ [x]  Follow-up with PCP   Additional Notes: Pt had a procedure 2 days ago, and is still experiencing pain. Pt has used ice packs with moderate temporary relief.  Pt was advised to use OTC pain medications at discharge - Tylenol. Pt has not used.  Pt will use Tylenol and ice packs per his discharge instructions given by provider. Pt will go to ED for evaluation if pain does not subide, or he experiences new S/S, such as fever redness, swelling. Pt will follow up with provider in the morning.    Pt called to report that he is in moderate pain at the injection site, he just had a procedure on his back and was told that he would be walking by now. The patient is not and he is upset about, please advise  Best contact: [phone]   Attempted to reach, left VM to call back to discuss symptoms. Reason for Disposition  [1] MODERATE back pain (e.g., interferes with normal activities) AND [2] present > 3 days  Answer Assessment - Initial Assessment Questions 1. ONSET: "When did the pain begin?"     Back pain has been ongoing for awhile. Per pt , pain was to be gone after procedure. 2. LOCATION: "Where does it hurt?" (upper, mid or lower back)     Back 3. SEVERITY: "How bad is the pain?"  (e.g., Scale 1-10; mild, moderate, or severe)   - MILD (1-3): doesn't interfere with normal activities    - MODERATE (4-7): interferes with normal activities or awakens from sleep    - SEVERE (8-10): excruciating pain, unable to do any normal activities      6 4. PATTERN: "Is the pain constant?" (e.g., yes, no; constant, intermittent)      yes 5. RADIATION: "Does the  pain shoot into your legs or elsewhere?"     no 6. CAUSE:  "What do you think is causing the back pain?"      Bad back and procedure 7. BACK OVERUSE:  "Any recent lifting of heavy objects, strenuous work or exercise?"     na 8. MEDICATIONS: "What have you taken so far for the pain?" (e.g., nothing, acetaminophen, NSAIDS)     Ice packs - somewhat effective. 9. NEUROLOGIC SYMPTOMS: "Do you have any weakness, numbness, or problems with bowel/bladder control?"     na 10. OTHER SYMPTOMS: "Do you have any other symptoms?" (e.g., fever, abdominal pain, burning with urination, blood in urine)       no 11. PREGNANCY: "Is there any chance you are pregnant?" (e.g., yes, no; LMP)       na  Protocols used: Back Pain-A-AH

## 2021-05-29 NOTE — Telephone Encounter (Signed)
Pt called to report that he is in moderate pain at the injection site, he just had a procedure on his back and was told that he would be walking by now. The patient is not and he is upset about, please advise  Best contact: [phone]   Attempted to reach, left VM to call back to discuss symptoms.

## 2021-05-31 NOTE — Telephone Encounter (Signed)
Pt calling back asking the same question about when he should be able to walk again. He states pain is better but still hurts a lot. Pt was advised to follow up with surgeon office or PCP on Monday to see if they can assist with questions. Pt encouraged to stay positive and not let this upset him. Pt verbalized understanding.

## 2021-06-18 ENCOUNTER — Other Ambulatory Visit: Payer: Self-pay | Admitting: Neurosurgery

## 2021-06-18 DIAGNOSIS — M47819 Spondylosis without myelopathy or radiculopathy, site unspecified: Secondary | ICD-10-CM

## 2021-06-19 ENCOUNTER — Other Ambulatory Visit: Payer: Self-pay

## 2021-06-19 ENCOUNTER — Ambulatory Visit
Admission: RE | Admit: 2021-06-19 | Discharge: 2021-06-19 | Disposition: A | Discharge status: Home / Self Care | Payer: Medicare PPO | Source: Ambulatory Visit | Attending: Neurosurgery | Admitting: Neurosurgery

## 2021-06-19 DIAGNOSIS — M47819 Spondylosis without myelopathy or radiculopathy, site unspecified: Secondary | ICD-10-CM

## 2021-06-19 IMAGING — XA Imaging study
2 series · 2 of 2 positions shown · non-contrast
Comparison: none

CLINICAL DATA: 65-year-old gentleman chronic low pain extending to
the lower extremities resulting lower extremity weakness returns to
radiology for epidural steroid injection.

[Series 2: ortho standard · 1 of 1 slices shown (1 of 2)]
[im 1/1]
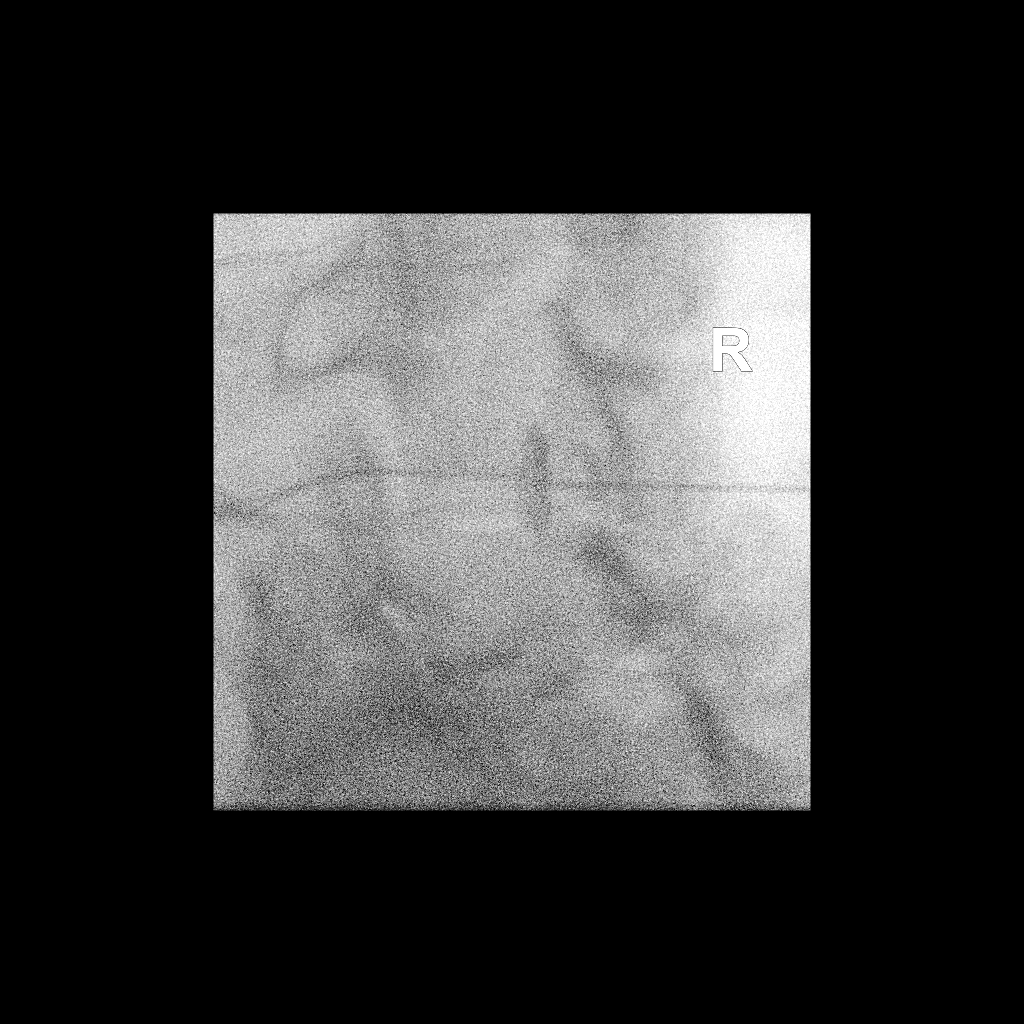

[Series 3: ortho standard · 1 of 1 slices shown (2 of 2)]
[im 1/1]
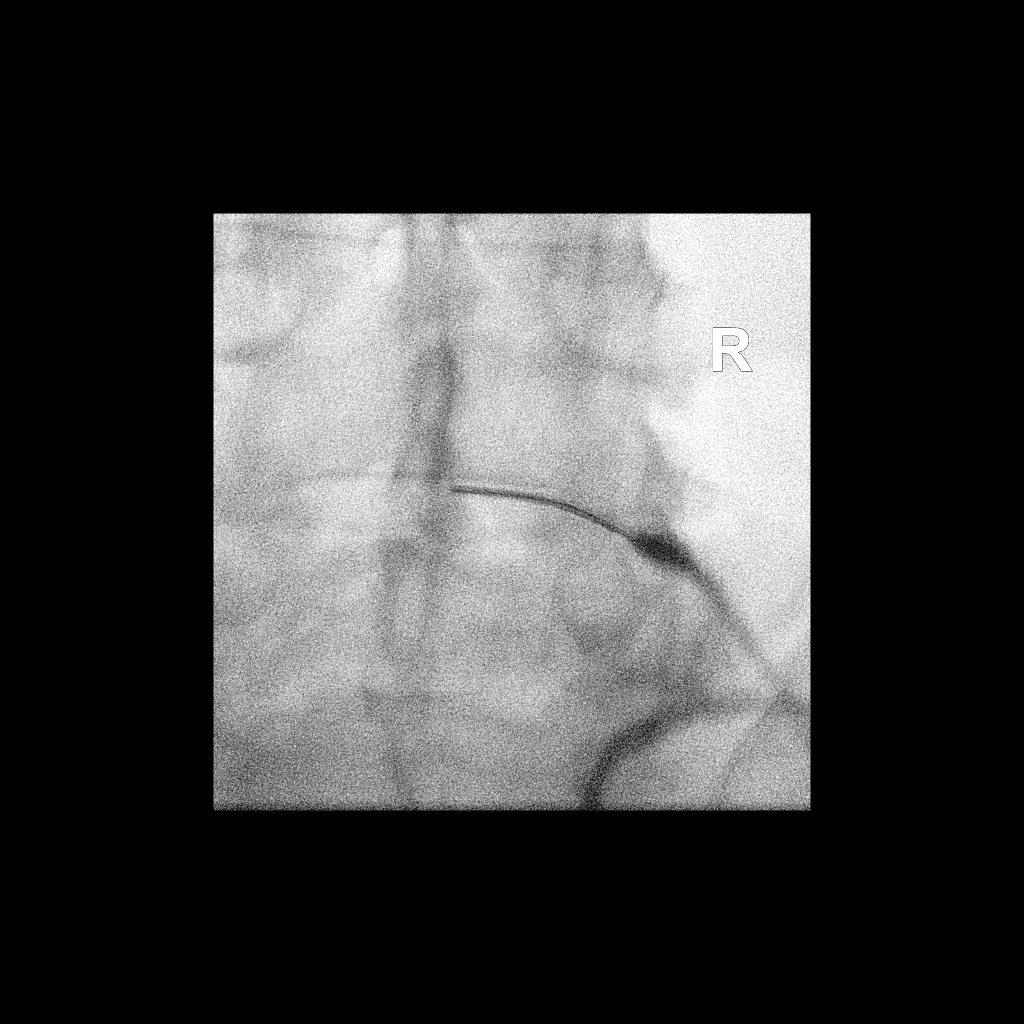

[2 of 2 positions shown; findings below may reference images not displayed]

FLUOROSCOPY:
Radiation Exposure Index (as provided by the fluoroscopic device):
9.2 mGy Kerma

PROCEDURE:
The procedure, risks, benefits, and alternatives were explained to
the patient. Questions regarding the procedure were encouraged and
answered. The patient understands and consents to the procedure.

LUMBAR EPIDURAL INJECTION:

An interlaminar approach was performed on left at L4-L5. The
overlying skin was cleansed and anesthetized. A 20 gauge epidural
needle was advanced using loss-of-resistance technique.

DIAGNOSTIC EPIDURAL INJECTION:

Injection of Isovue-M 200 shows a good epidural pattern with spread
above and below the level of needle placement, primarily in the
midline no vascular opacification is seen.

THERAPEUTIC EPIDURAL INJECTION:

80 mg of Depo-Medrol mixed with 2 mL of lidocaine were instilled.
The procedure was well-tolerated, and the patient was discharged
thirty minutes following the injection in good condition.

COMPLICATIONS:
None
IMPRESSION: Technically successful epidural injection on the left L4-L5.

## 2021-06-19 MED ORDER — IOPAMIDOL (ISOVUE-M 200) INJECTION 41%
10.0000 mL | Freq: Once | INTRAMUSCULAR | Status: AC
  Administered 2021-06-19: 10 mL via EPIDURAL

## 2021-06-19 MED ORDER — METHYLPREDNISOLONE ACETATE 40 MG/ML INJ SUSP (RADIOLOG
80.0000 mg | Freq: Once | INTRAMUSCULAR | Status: AC
  Administered 2021-06-19: 80 mg via EPIDURAL

## 2021-06-19 NOTE — Discharge Instructions (Signed)

## 2021-06-20 ENCOUNTER — Ambulatory Visit: Payer: No Typology Code available for payment source

## 2021-06-20 NOTE — Therapy (Deleted)
OUTPATIENT PHYSICAL THERAPY THORACOLUMBAR EVALUATION   Patient Name: Timothy [name] MRN: [id number] DOB:[birth year], 66 y.o., male Today's Date: 06/20/2021    Past Medical History:  Diagnosis Date   Behavioral disorder    Cancer [facility])    Constipation    Gonorrhea    Past Surgical History:  Procedure Laterality Date   BACK SURGERY     Patient Active Problem List   Diagnosis Date Noted   Constipation 02/02/2016   UTI (lower urinary tract infection)    Paranoid schizophrenia ([place]) 02/01/2016    PCP: Raelyn Mora, MD  REFERRING PROVIDER: Consuella Lose, MD  REFERRING DIAG: [phone] (ICD-10-CM) - Spondylosis without myelopathy or radiculopathy, lumbar region   THERAPY DIAG:  No diagnosis found.  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  Use a walker as needed as well as a WC if symptoms significant enough  PAIN:  Are you having pain? {yes/no:20286} NPRS scale: ***/10 Pain location: *** Pain orientation: {Pain Orientation:25161}  PAIN TYPE: {type:313116} Pain description: {PAIN DESCRIPTION:21022940}  Aggravating factors: *** Relieving factors: ***  PRECAUTIONS: None  WEIGHT BEARING RESTRICTIONS No  FALLS:  Has patient fallen in last 6 months? {yes/no:20286}, Number of falls: ***  LIVING ENVIRONMENT: Lives with: {[facility] lives with:25569::"lives with their family"} Lives in: {Lives in:25570} Stairs: {yes/no:20286}; {Stairs:24000} Has following equipment at home: {Assistive devices:23999}  OCCUPATION: disabled  PLOF: Independent with basic ADLs, Independent with household mobility with device, and Independent with community mobility  with device  PATIENT GOALS ***   OBJECTIVE:   DIAGNOSTIC FINDINGS:  MR Lumbar Spine W Wo Contrast   Result Date: 04/20/2021 CLINICAL DATA:  Low back pain EXAM: MRI LUMBAR SPINE WITHOUT AND WITH CONTRAST TECHNIQUE: Multiplanar and multiecho pulse sequences of the lumbar spine were obtained without and with intravenous contrast. CONTRAST:  64mL GADAVIST GADOBUTROL 1 MMOL/ML IV SOLN COMPARISON:  02/23/2021 FINDINGS: Segmentation:  Standard. Alignment:  Unchanged grade 1 anterolisthesis at L5-S1 Vertebrae: No fracture, evidence of discitis, or bone lesion. No abnormal contrast enhancement Conus medullaris and cauda equina: Conus extends to the L1 level. Conus and cauda equina appear normal. Paraspinal and other soft tissues: Negative. Disc levels: L1-L2: Normal disc space and facet joints. No spinal canal stenosis. No neural foraminal stenosis. L2-L3: Moderate facet hypertrophy is unchanged. No disc herniation. No  spinal canal stenosis. No neural foraminal stenosis. L3-L4: Moderate facet hypertrophy and small disc bulge, unchanged. No spinal canal stenosis. No neural foraminal stenosis. L4-L5: Intermediate sized disc bulge and moderate facet hypertrophy, unchanged. Left lateral recess narrowing without central spinal canal stenosis. Unchanged mild left neural foraminal stenosis. L5-S1: Severe facet arthrosis, unchanged. No spinal canal stenosis. Unchanged mild right and moderate left neural foraminal stenosis. Visualized sacrum: Normal. IMPRESSION: 1. Unchanged examination of the lumbar spine with moderate-to-severe facet arthrosis at L2-L3, L3-L4, L4-L5 and L5-S1. 2. Unchanged L4-L5 left lateral recess and mild left neural foraminal stenosis. 3. Unchanged L5-S1 mild right and moderate left neural foraminal stenosis. Electronically Signed   By: Ulyses Jarred M.D.   On: 04/20/2021 20:13    PATIENT SURVEYS:  FOTO ***  SCREENING FOR RED FLAGS: Bowel or bladder incontinence: {Yes/No:304960894} Spinal  tumors: {Yes/No:304960894} Cauda equina syndrome: {Yes/No:304960894} Compression fracture: {Yes/No:304960894} Abdominal aneurysm: {Yes/No:304960894}  COGNITION:  Overall cognitive status: Within functional limits for tasks assessed     SENSATION:  Light touch: Appears intact   MUSCLE LENGTH: Hamstrings: Right *** deg; Left *** deg Thomas test: Right *** deg; Left *** deg  POSTURE:  ***  PALPATION: ***  LUMBARAROM/PROM  A/PROM A/PROM  06/20/2021  Flexion   Extension   Right lateral flexion   Left lateral flexion   Right rotation   Left rotation    (Blank rows = not tested)  LE AROM/PROM:  A/PROM Right 06/20/2021 Left 06/20/2021  Hip flexion    Hip extension    Hip abduction    Hip adduction    Hip internal rotation    Hip external rotation    Knee flexion    Knee extension    Ankle dorsiflexion    Ankle plantarflexion    Ankle inversion    Ankle eversion     (Blank rows = not tested)  LE MMT:  MMT Right 06/20/2021 Left 06/20/2021  Hip flexion    Hip extension    Hip abduction    Hip adduction    Hip internal rotation    Hip external rotation    Knee flexion    Knee extension    Ankle dorsiflexion    Ankle plantarflexion    Ankle inversion    Ankle eversion     (Blank rows = not tested)  LUMBAR SPECIAL TESTS:  {lumbar special test:25242}  FUNCTIONAL TESTS:  {Functional tests:24029}  GAIT: Distance walked: *** Assistive device utilized: {Assistive devices:23999} Level of assistance: {Levels of assistance:24026} Comments: ***    TODAY'S TREATMENT  ***   PATIENT EDUCATION:  Education details: Discussed eval findings, rehab rationale and POC and patient is in agreement  Person educated: {Person educated:25204} Education method: {Education Method:25205} Education comprehension: {Education Comprehension:25206}   HOME EXERCISE PROGRAM: ***  ASSESSMENT:  CLINICAL IMPRESSION: Patient is a *** y.o. *** who was seen today for physical  therapy evaluation and treatment for ***. Objective impairments include {opptimpairments:25111}. These impairments are limiting patient from {activity limitations:25113}. Personal factors including {Personal factors:25162} are also affecting patient's functional outcome. Patient will benefit from skilled PT to address above impairments and improve overall function.  REHAB POTENTIAL: Fair ***  CLINICAL DECISION MAKING: Evolving/moderate complexity  EVALUATION COMPLEXITY: Moderate   GOALS: Goals reviewed with patient? Yes  SHORT TERM GOALS:  STG Name Target Date Goal status  1 *** Baseline:  {follow up:25551} {GOALSTATUS:25110}  2 *** Baseline:  {follow up:25551} {GOALSTATUS:25110}  3 *** Baseline: {follow up:25551} {GOALSTATUS:25110}  4 *** Baseline: {follow up:25551} {  GOALSTATUS:25110}  5 *** Baseline: {follow up:25551} {GOALSTATUS:25110}  6 *** Baseline: {follow up:25551} {GOALSTATUS:25110}  7 *** Baseline: {follow up:25551} {GOALSTATUS:25110}   LONG TERM GOALS:   LTG Name Target Date Goal status  1 *** Baseline: {follow up:25551} {GOALSTATUS:25110}  2 *** Baseline: {follow up:25551} {GOALSTATUS:25110}  3 *** Baseline: {follow up:25551} {GOALSTATUS:25110}  4 *** Baseline: {follow up:25551} {GOALSTATUS:25110}  5 *** Baseline: {follow up:25551} {GOALSTATUS:25110}  6 *** Baseline: {follow up:25551} {GOALSTATUS:25110}  7 *** Baseline: {follow up:25551} {GOALSTATUS:25110}   PLAN: PT FREQUENCY: {rehab frequency:25116}  PT DURATION: {rehab duration:25117}  PLANNED INTERVENTIONS: {rehab planned interventions:25118::"Therapeutic exercises","Therapeutic activity","Neuro Muscular re-education","Balance training","Gait training","Patient/Family education","Joint mobilization"}  PLAN FOR NEXT SESSION: ***   Lanice Shirts, PT 06/20/2021, 12:24 PM

## 2021-06-27 ENCOUNTER — Emergency Department (HOSPITAL_COMMUNITY)
Admission: EM | Admit: 2021-06-27 | Discharge: 2021-06-27 | Disposition: A | Discharge status: Home / Self Care | Payer: No Typology Code available for payment source | Attending: Student | Admitting: Student

## 2021-06-27 ENCOUNTER — Encounter (HOSPITAL_COMMUNITY): Payer: Self-pay | Admitting: Emergency Medicine

## 2021-06-27 ENCOUNTER — Other Ambulatory Visit: Payer: Self-pay

## 2021-06-27 DIAGNOSIS — M5441 Lumbago with sciatica, right side: Secondary | ICD-10-CM | POA: Insufficient documentation

## 2021-06-27 DIAGNOSIS — M545 Low back pain, unspecified: Secondary | ICD-10-CM | POA: Diagnosis present

## 2021-06-27 DIAGNOSIS — G8929 Other chronic pain: Secondary | ICD-10-CM | POA: Insufficient documentation

## 2021-06-27 DIAGNOSIS — M5442 Lumbago with sciatica, left side: Secondary | ICD-10-CM | POA: Insufficient documentation

## 2021-06-27 MED ORDER — KETOROLAC TROMETHAMINE 10 MG PO TABS
10.0000 mg | ORAL_TABLET | Freq: Four times a day (QID) | ORAL | 0 refills | Status: DC | PRN
Start: 2021-06-27 — End: 2021-12-17

## 2021-06-27 NOTE — ED Provider Notes (Signed)
[facility] EMERGENCY DEPARTMENT Provider Note   CSN: [id number] Arrival date & time: 06/27/21  1542     History  No chief complaint on file.   Timothy [name] is a 66 y.o. male.  Resents to the emergency department today complaining of lumbar pain.  His pain has been ongoing for years and is currently followed by Dr. Kathyrn Sheriff. The patient was recently seen for possible nerve block but was administered a steroid injection. The patient states that the injection did not provide much relief. He has had a toradol injection at the emergency department in the past which provided significant relief. He states that he called his doctor today who recommended he come to the emergency department for a possible injection. The patient does have history of glaucoma and cataracts which are followed by providers with the [facility]. The patient asked if the emergency department could provide a routine evaluation of his eye health but the patient currently has no vision changes. He does have an appointment with ophthalmology on 2/15.   HPI     Home Medications Prior to Admission medications   Medication Sig Start Date End Date Taking? Authorizing Provider  azithromycin (ZITHROMAX) 250 MG tablet Take 1 tablet (250 mg total) by mouth daily. Take first 2 tablets together, then 1 every day until finished. 03/01/21   Tedd Sias, PA  benzonatate (TESSALON) 100 MG capsule Take 1 capsule (100 mg total) by mouth every 8 (eight) hours. 03/01/21   Tedd Sias, PA  bisacodyl (DULCOLAX) 5 MG EC tablet Take 5 mg by mouth daily as needed for moderate constipation.    [provider]  Calcium Carb-Cholecalciferol (CALCIUM-VITAMIN D) 500-400 MG-UNIT TABS Take 1 tablet by mouth daily.    [provider]  cetirizine (ZYRTEC ALLERGY) 10 MG tablet Take 1 tablet (10 mg total) by mouth daily. 07/19/20   Hazel Sams, PA-C  cyclobenzaprine (FLEXERIL) 10 MG tablet Take 1 tablet (10 mg  total) by mouth 2 (two) times daily as needed for muscle spasms. 02/27/21   Gareth Morgan, MD  diazepam (VALIUM) 5 MG tablet Take 2.5 mg by mouth at bedtime as needed for anxiety. Patient not taking: Reported on 10/20/2020    [provider]  diclofenac Sodium (VOLTAREN) 1 % GEL Apply 2 g topically 4 (four) times daily. 11/20/20   Hans Eden, NP  diphenhydrAMINE (BENADRYL) 25 mg capsule Take 25 mg by mouth at bedtime as needed for itching or sleep.    [provider]  gabapentin (NEURONTIN) 300 MG capsule Take 1 capsule (300 mg total) by mouth 3 (three) times daily. Patient not taking: Reported on 10/20/2020 06/18/20   Jaynee Eagles, PA-C  haloperidol (HALDOL) 5 MG tablet Take 5 mg by mouth at bedtime.    [provider]  haloperidol decanoate (HALDOL DECANOATE) 50 MG/ML injection Inject 50 mg into the muscle every 28 (twenty-eight) days.    [provider]  ketorolac (TORADOL) 10 MG tablet Take 1 tablet (10 mg total) by mouth every 6 (six) hours as needed. 06/27/21  Yes Cherlynn June B, PA  lactulose (CHRONULAC) 10 GM/15ML solution Take 20 g by mouth 3 (three) times a week.    [provider]  methylPREDNISolone (MEDROL DOSEPAK) 4 MG TBPK tablet See instructions 02/27/21   Gareth Morgan, MD  naproxen (NAPROSYN) 500 MG tablet Take 1 tablet (500 mg total) by mouth 2 (two) times daily as needed. 02/23/21   Horton, Alvin Critchley, DO  omeprazole (PRILOSEC) 20 MG capsule Take 40 mg by mouth 2 (two) times daily before a meal.    [provider]  oxybutynin (DITROPAN) 5 MG tablet Take 5 mg by mouth 3 (three) times daily as needed for bladder spasms.    [provider]  oxyCODONE-acetaminophen (PERCOCET/ROXICET) 5-325 MG tablet Take 1 tablet by mouth every 6 (six) hours as needed for severe pain. 02/27/21   Gareth Morgan, MD  polyethylene glycol (MIRALAX / GLYCOLAX) packet Take 17 g by mouth 3 (three) times a week.    [provider]   promethazine-dextromethorphan (PROMETHAZINE-DM) 6.25-15 MG/5ML syrup Take 5 mLs by mouth 4 (four) times daily as needed for cough. 03/01/21   Tedd Sias, PA  pseudoephedrine (SUDAFED) 30 MG tablet Take 30 mg by mouth every 6 (six) hours as needed for congestion. Patient not taking: Reported on 10/20/2020    [provider]  sertraline (ZOLOFT) 50 MG tablet Take 50 mg by mouth daily.    [provider]  sulfamethoxazole-trimethoprim (BACTRIM DS) 800-160 MG tablet Take 1 tablet by mouth 2 (two) times daily. Patient not taking: Reported on 10/20/2020 02/02/16   Francine Graven, DO  VIAGRA 100 MG tablet Take 100 mg by mouth as needed for erectile dysfunction.  01/24/16   [provider]      Allergies    Penicillins    Review of Systems   Review of Systems  Constitutional:  Negative for fever.  Eyes:  Negative for visual disturbance.  Respiratory:  Negative for shortness of breath.   Cardiovascular:  Negative for chest pain.  Gastrointestinal:  Negative for abdominal pain.  Musculoskeletal:  Positive for back pain. Negative for neck pain.   Physical Exam Updated Vital Signs BP 102/74 (BP Location: Left Arm)    Pulse 100    Temp 98.9 F (37.2 C) (Oral)    Resp 16    SpO2 100%  Physical Exam Constitutional:      General: He is not in acute distress. HENT:     Head: Normocephalic.  Eyes:     Conjunctiva/sclera: Conjunctivae normal.  Cardiovascular:     Rate and Rhythm: Normal rate and regular rhythm.     Pulses: Normal pulses.     Heart sounds: Normal heart sounds.  Pulmonary:     Effort: Pulmonary effort is normal. No respiratory distress.     Breath sounds: Normal breath sounds.  Musculoskeletal:     Cervical back: Normal range of motion.     Comments: Tenderness with palpation of paraspinal muscles in lumbar region.  Lower leg strength 4/5 bilaterally, consistent with previous exam  Neurological:     Mental Status: He is alert.    ED Results /  Procedures / Treatments   Labs (all labs ordered are listed, but only abnormal results are displayed) Labs Reviewed - No data to display  EKG None  Radiology No results found.  Procedures Procedures    Medications Ordered in ED Medications - No data to display  ED Course/ Medical Decision Making/ A&P                           Medical Decision Making  The patient presents today with ongoing pain from known lumbar facet arthropathy. The patient has previously been treated with toradol. Last BMP showed creatinine of 0.78. I feel comfortable with discharging the patient home with a Toradol prescription. I see no indication for admission at this time.  Return precautions provided Final Clinical Impression(s) / ED Diagnoses Final diagnoses:  Chronic bilateral low back pain, unspecified whether sciatica present    Rx / DC Orders ED Discharge Orders          Ordered    ketorolac (TORADOL) 10 MG tablet  Every 6 hours PRN        06/27/21 1655              Dorothyann Peng, [place] 06/27/21 1656    Lucrezia Starch, MD 06/27/21 2300

## 2021-06-27 NOTE — Discharge Instructions (Addendum)
You were seen today for lumbar spine pain. I recommend follow up with your primary care physician for further management. You were provided a prescription for Toradol to help with your inflammation. Keep your upcoming opthalmology appointment for evaluation of your cataracts. Return to the emergency department if symptoms worsen, you are unable to walk, you become incontinent, or have other life threatening symptoms.

## 2021-06-27 NOTE — ED Triage Notes (Signed)
Pt here from home with c/o gen pain back and shoulder , pt  has had steroid injections for the pain recently

## 2021-08-16 ENCOUNTER — Ambulatory Visit: Payer: Self-pay

## 2021-08-16 NOTE — Telephone Encounter (Signed)
?  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 Pt states that the [place] will remove them, but he needs a diagnosis of same first. Suggested pt go to PCP, but pt is going to ED ?Reason for Disposition ? [1] Blurred vision or visual changes AND [2] gradual onset (e.g., weeks, months) ? ?Answer Assessment - Initial Assessment Questions ?1. DESCRIPTION: "What is the vision loss like? Describe it for me." (e.g., complete vision loss, blurred vision, double vision, floaters, etc.) ?    Double vision - blurred ?2. LOCATION: "One or both eyes?" If one, ask: "Which eye?" ?    Both - has cataracts ?3. SEVERITY: "Can you see anything?" If Yes, ask: "What can you see?" (e.g., fine print) ?    yes ?4. ONSET: "When did this begin?" "Did it start suddenly or has this been gradual?" ?    1-2 years - worse a couple of months ago ?5. PATTERN: "Does this come and go, or has it been constant since it started?" ?    constant ?6. PAIN: "Is there any pain in your eye(s)?"  (Scale 1-10; or mild, moderate, severe) ?    Yes 2/10 - aching ?7. CONTACTS-GLASSES: "Do you wear contacts or glasses?" ?    Glasses - reading ?8. CAUSE: "What do you think is causing this visual problem?" ?    Traumatic cataracts ?9. OTHER SYMPTOMS: "Do you have any other symptoms?" (e.g., confusion, headache, arm or leg weakness, speech problems) ?    no ?10. PREGNANCY: "Is there any chance you are pregnant?" "When was your last menstrual period?" ?      na ? ?Protocols used: Vision Loss or [id number] ? ?

## 2021-08-17 ENCOUNTER — Emergency Department (HOSPITAL_COMMUNITY)
Admission: EM | Admit: 2021-08-17 | Discharge: 2021-08-17 | Disposition: A | Discharge status: Home / Self Care | Payer: No Typology Code available for payment source | Attending: Emergency Medicine | Admitting: Emergency Medicine

## 2021-08-17 ENCOUNTER — Encounter (HOSPITAL_COMMUNITY): Payer: Self-pay | Admitting: Emergency Medicine

## 2021-08-17 ENCOUNTER — Emergency Department (HOSPITAL_COMMUNITY): Payer: No Typology Code available for payment source

## 2021-08-17 ENCOUNTER — Other Ambulatory Visit: Payer: Self-pay

## 2021-08-17 DIAGNOSIS — H5789 Other specified disorders of eye and adnexa: Secondary | ICD-10-CM | POA: Diagnosis present

## 2021-08-17 DIAGNOSIS — R519 Headache, unspecified: Secondary | ICD-10-CM | POA: Diagnosis not present

## 2021-08-17 DIAGNOSIS — H269 Unspecified cataract: Secondary | ICD-10-CM | POA: Insufficient documentation

## 2021-08-17 IMAGING — CT CT HEAD W/O CM
4 series · 16 of 47 positions shown, 18 images · non-contrast
Comparison: None.

CLINICAL DATA: 65-year-old male with headache, bilateral eye pain
and blurred vision.



[Series 3: head wo · axial · 0.47mm/px · z∈[-116,+4]mm · 6 of 34 slices shown, 8 images]
[im 5/34  brain]
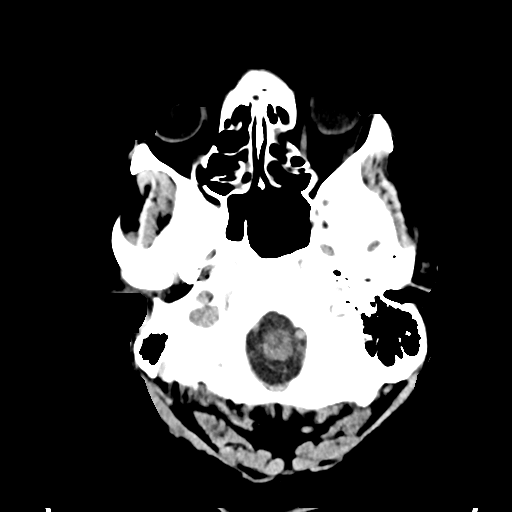
[im 5/34  bone]
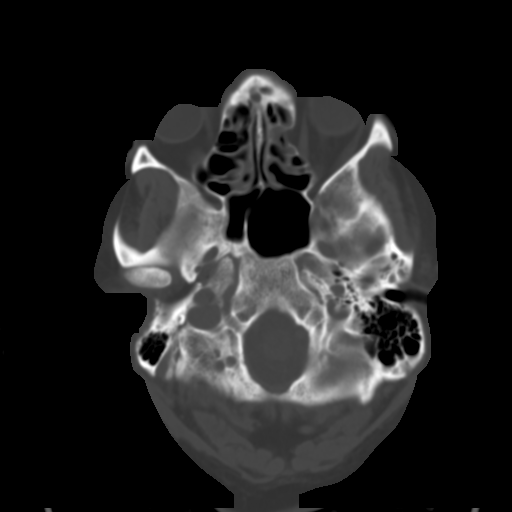
[im 10/34  brain]
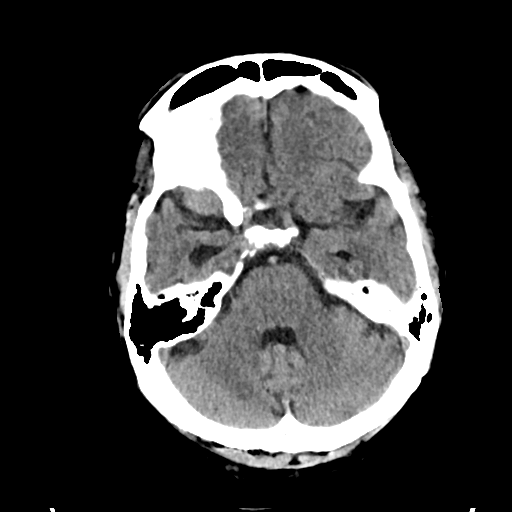
[im 15/34  brain]
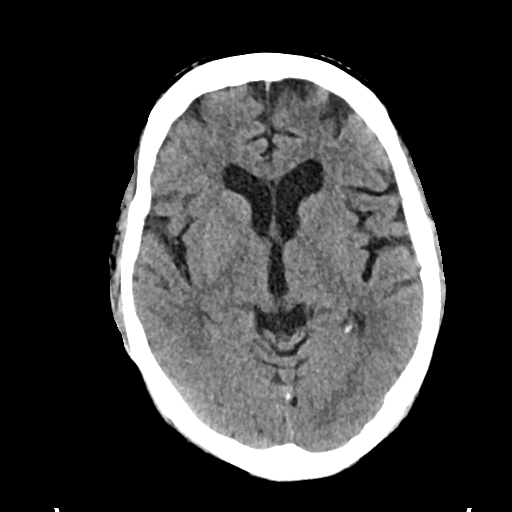
[im 19/34  brain]
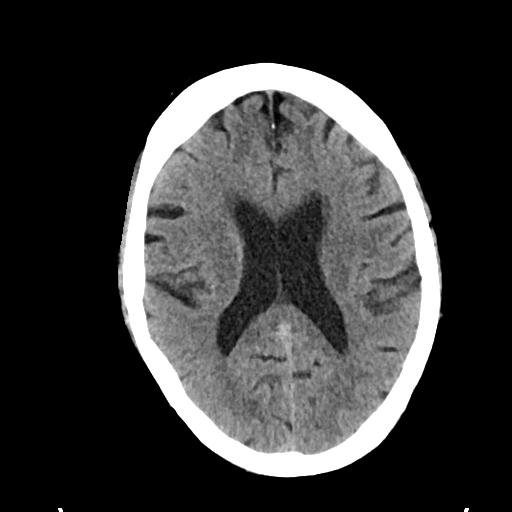
[im 24/34  brain]
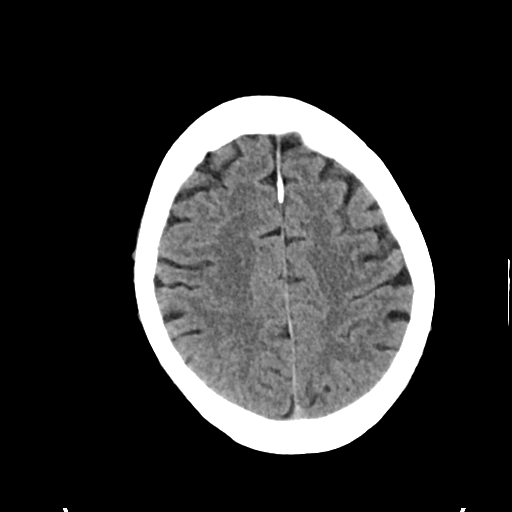
[im 24/34  bone]
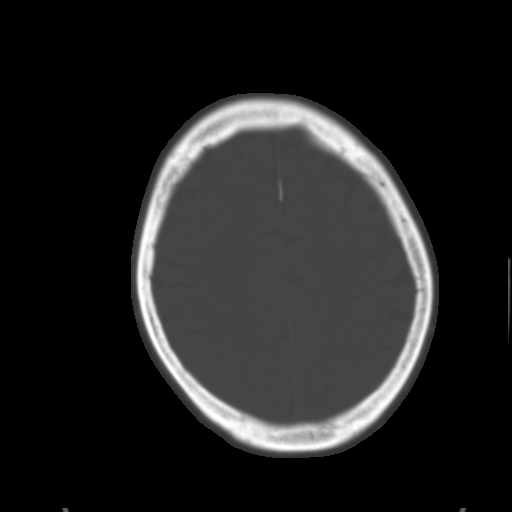
[im 29/34  brain]
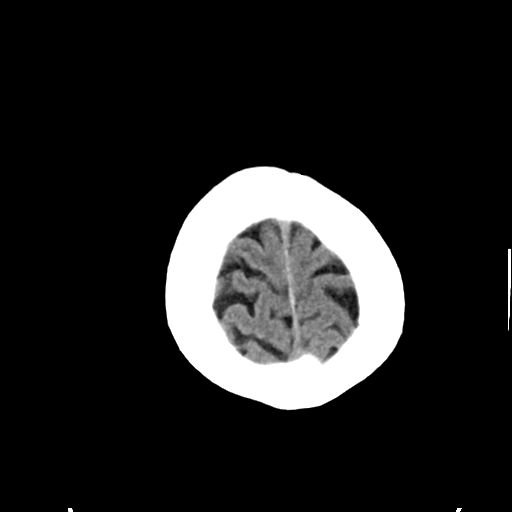

[Series 4: cor soft · coronal · 0.35mm/px · 3 of 71 slices shown]
[im 24/71  brain]
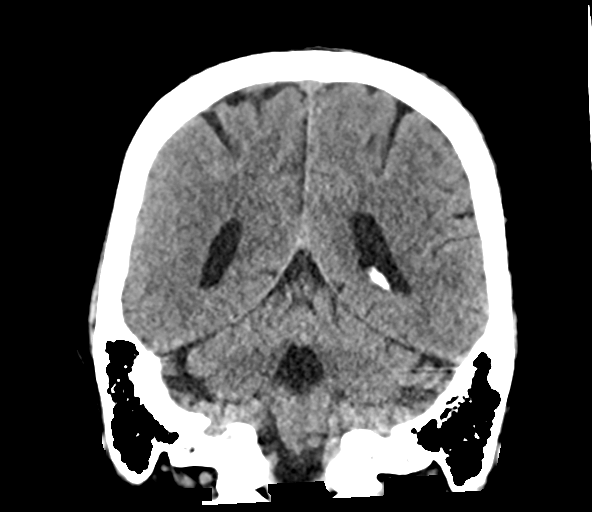
[im 32/71  brain]
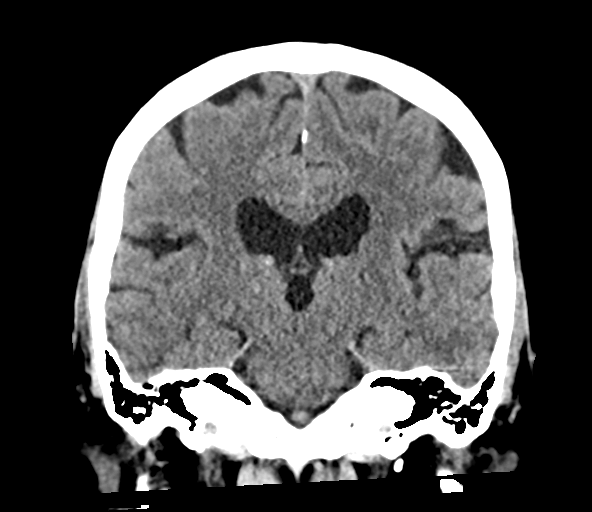
[im 39/71  brain]
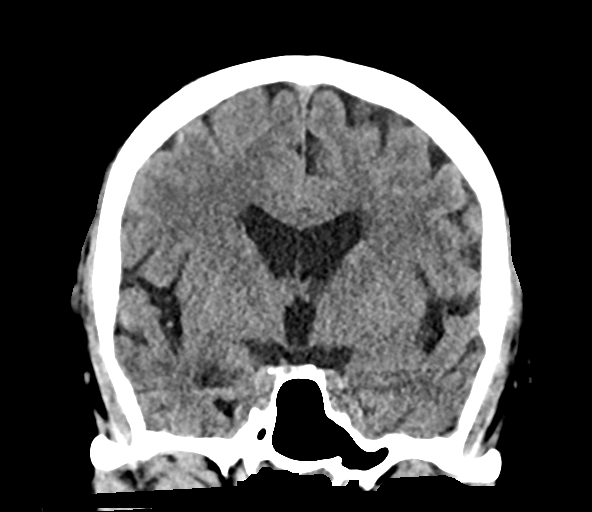

[Series 5: head bone · axial · 0.46mm/px · z∈[-106,-46]mm · 4 of 90 slices shown]
[im 9/90  bone]
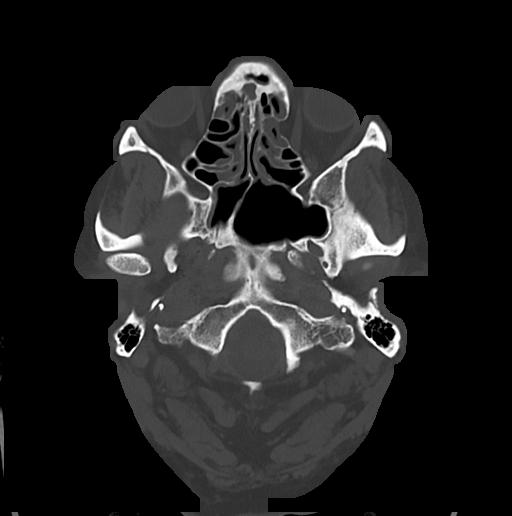
[im 17/90  bone]
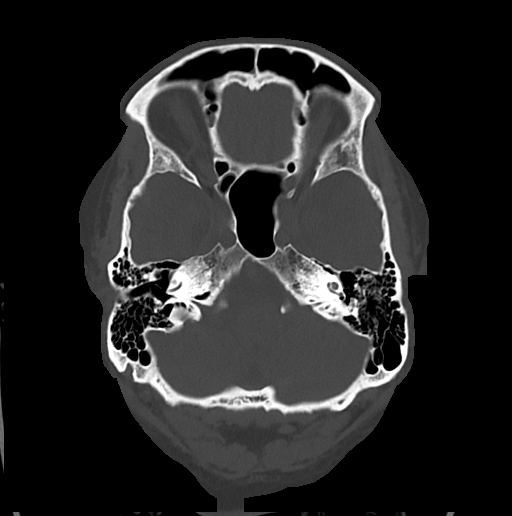
[im 30/90  bone]
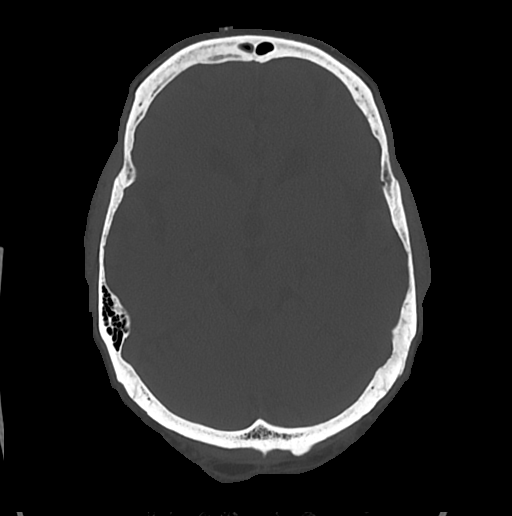
[im 39/90  bone]
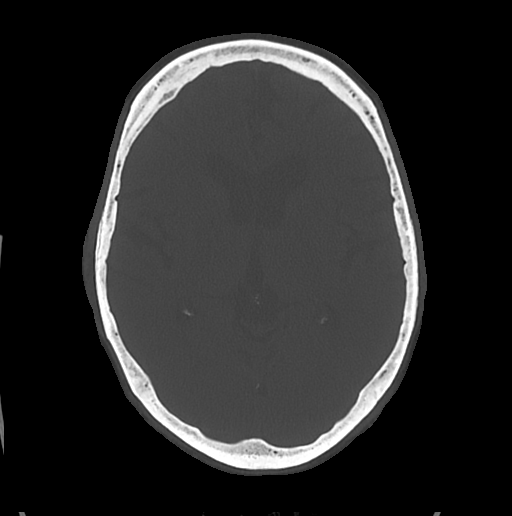

[Series 6: sag soft · sagittal · 0.35mm/px · 3 of 54 slices shown]
[im 18/54  brain]
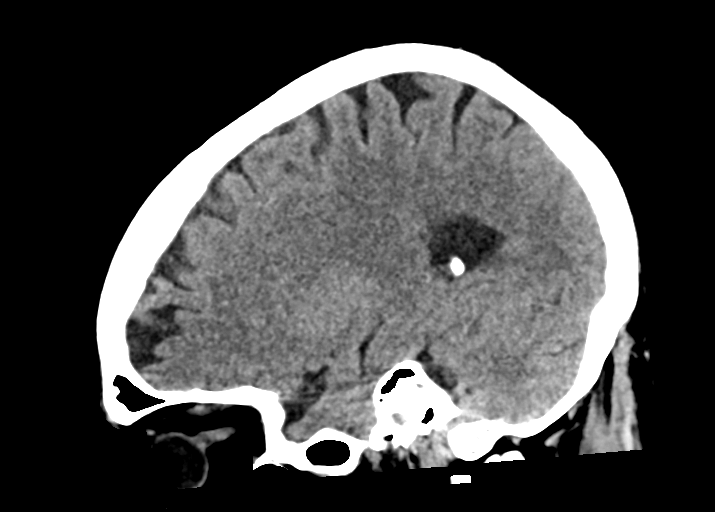
[im 27/54  brain]
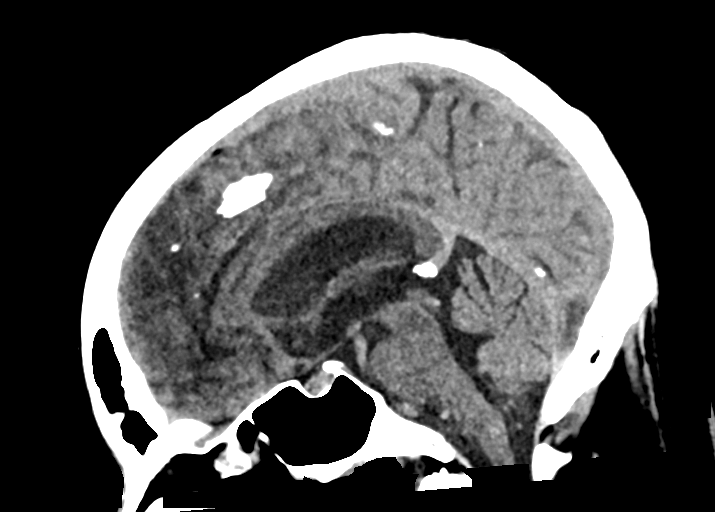
[im 36/54  brain]
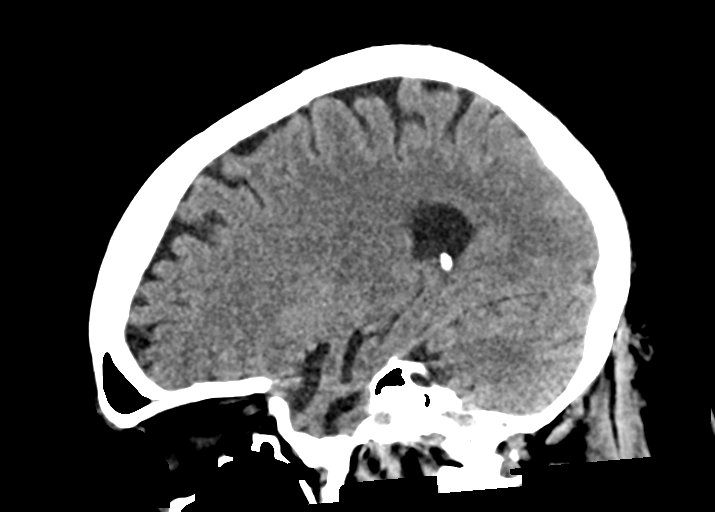

[16 of 47 positions shown; findings below may reference images not displayed]

FINDINGS: Brain: No evidence of acute infarction, hemorrhage, hydrocephalus,
extra-axial collection or mass lesion/mass effect.

Atrophy and mild probable chronic small-vessel white matter ischemic
changes are noted.

Vascular: Carotid atherosclerotic calcifications are noted.

Skull: Normal. Negative for fracture or focal lesion.

Sinuses/Orbits: No acute finding. Mucosal thickening in scattered
ethmoid air cells noted.

Other: None
IMPRESSION: 1. No evidence of acute intracranial abnormality.
2. Atrophy and mild probable chronic small-vessel white matter
ischemic changes.

## 2021-08-17 MED ORDER — HYPROMELLOSE (GONIOSCOPIC) 2.5 % OP SOLN: 1.0000 [drp] | Freq: Three times a day (TID) | OPHTHALMIC | 12 refills | Status: AC | PRN

## 2021-08-17 MED ORDER — TETRACAINE HCL 0.5 % OP SOLN
1.0000 [drp] | Freq: Once | OPHTHALMIC | Status: DC
  Filled 2021-08-17: qty 4

## 2021-08-17 MED ORDER — FLUORESCEIN SODIUM 1 MG OP STRP
1.0000 | ORAL_STRIP | Freq: Once | OPHTHALMIC | Status: DC
  Filled 2021-08-17: qty 1

## 2021-08-17 NOTE — Discharge Instructions (Addendum)
You were evaluated in the Emergency Department and after careful evaluation, we did not find any emergent condition requiring admission or further testing in the hospital. ? ?Your exam/testing today was overall reassuring. Your eye staining did not reveal any corneal abrasions and your neuro exam was not concerning for stroke. Your CT Head revealed the following findings: ?IMPRESSION:  ?1. No evidence of acute intracranial abnormality.  ?2. Atrophy and mild probable chronic small-vessel white matter  ?ischemic changes.  ?   ? ? ? ?Please return to the Emergency Department if you experience any worsening of your condition.  Thank you for allowing [place] to be a part of your care. ? ?

## 2021-08-17 NOTE — ED Triage Notes (Signed)
Pt reports bilateral eye pain, light sensitivity, blurred vision, and double vision x 2 years.  States he was told he has cataracts and wants to know if they are due to trauma from TBI or due to age.  States he has been seen at [facility] in the past for same but he felt like they were rude. ?

## 2021-08-17 NOTE — ED Provider Notes (Signed)
?[facility] ?Provider Note ? ? ?CSN: [id number] ?Arrival date & time: 08/17/21  1232 ? ?  ? ?History ? ?Chief Complaint  ?Patient presents with  ? Eye Pain  ? ? ?Timothy [name] is a 66 y.o. male. ? ? ?Eye Pain ? ? ?66 year old male who follows with the [facility] for his medical care with past medical history significant for schizophrenia who presents to the emergency department with blurred vision, sensitivity to light, bilateral eye discomfort that has been ongoing for the last 2 years.  He states that he was seen at the [place] and told he had cataracts.  He is unsure if this is from a TBI sustained long time ago while in the [employer].  He is requesting a second opinion.  He denies any other complaints.  He denies any numbness, weakness, facial droop. ? ?Home Medications ?Prior to Admission medications   ?Medication Sig Start Date End Date Taking? Authorizing Provider  ?hydroxypropyl methylcellulose / hypromellose (ISOPTO TEARS / GONIOVISC) 2.5 % ophthalmic solution Place 1 drop into both eyes 3 (three) times daily as needed for dry eyes. 08/17/21  Yes Regan Lemming, MD  ?azithromycin (ZITHROMAX) 250 MG tablet Take 1 tablet (250 mg total) by mouth daily. Take first 2 tablets together, then 1 every day until finished. 03/01/21   Tedd Sias, PA  ?benzonatate (TESSALON) 100 MG capsule Take 1 capsule (100 mg total) by mouth every 8 (eight) hours. 03/01/21   Tedd Sias, PA  ?bisacodyl (DULCOLAX) 5 MG EC tablet Take 5 mg by mouth daily as needed for moderate constipation.    [provider]  ?Calcium Carb-Cholecalciferol (CALCIUM-VITAMIN D) 500-400 MG-UNIT TABS Take 1 tablet by mouth daily.    [provider]  ?cetirizine (ZYRTEC ALLERGY) 10 MG tablet Take 1 tablet (10 mg total) by mouth daily. 07/19/20   Hazel Sams, PA-C  ?cyclobenzaprine (FLEXERIL) 10 MG tablet Take 1 tablet (10 mg total) by mouth 2 (two) times daily as needed for muscle spasms. 02/27/21    Gareth Morgan, MD  ?diazepam (VALIUM) 5 MG tablet Take 2.5 mg by mouth at bedtime as needed for anxiety. ?Patient not taking: Reported on 10/20/2020    [provider]  ?diclofenac Sodium (VOLTAREN) 1 % GEL Apply 2 g topically 4 (four) times daily. 11/20/20   Hans Eden, NP  ?diphenhydrAMINE (BENADRYL) 25 mg capsule Take 25 mg by mouth at bedtime as needed for itching or sleep.    [provider]  ?gabapentin (NEURONTIN) 300 MG capsule Take 1 capsule (300 mg total) by mouth 3 (three) times daily. ?Patient not taking: Reported on 10/20/2020 06/18/20   Jaynee Eagles, PA-C  ?haloperidol (HALDOL) 5 MG tablet Take 5 mg by mouth at bedtime.    [provider]  ?haloperidol decanoate (HALDOL DECANOATE) 50 MG/ML injection Inject 50 mg into the muscle every 28 (twenty-eight) days.    [provider]  ?ketorolac (TORADOL) 10 MG tablet Take 1 tablet (10 mg total) by mouth every 6 (six) hours as needed. 06/27/21   Dorothyann Peng, PA-C  ?lactulose (CHRONULAC) 10 GM/15ML solution Take 20 g by mouth 3 (three) times a week.    [provider]  ?methylPREDNISolone (MEDROL DOSEPAK) 4 MG TBPK tablet See instructions 02/27/21   Gareth Morgan, MD  ?naproxen (NAPROSYN) 500 MG tablet Take 1 tablet (500 mg total) by mouth 2 (two) times daily as needed. 02/23/21   Horton, Alvin Critchley, DO  ?omeprazole (PRILOSEC) 20  MG capsule Take 40 mg by mouth 2 (two) times daily before a meal.    [provider]  ?oxybutynin (DITROPAN) 5 MG tablet Take 5 mg by mouth 3 (three) times daily as needed for bladder spasms.    [provider]  ?oxyCODONE-acetaminophen (PERCOCET/ROXICET) 5-325 MG tablet Take 1 tablet by mouth every 6 (six) hours as needed for severe pain. 02/27/21   Gareth Morgan, MD  ?polyethylene glycol (MIRALAX / GLYCOLAX) packet Take 17 g by mouth 3 (three) times a week.    [provider]  ?promethazine-dextromethorphan (PROMETHAZINE-DM) 6.25-15 MG/5ML syrup Take 5  mLs by mouth 4 (four) times daily as needed for cough. 03/01/21   Tedd Sias, PA  ?pseudoephedrine (SUDAFED) 30 MG tablet Take 30 mg by mouth every 6 (six) hours as needed for congestion. ?Patient not taking: Reported on 10/20/2020    [provider]  ?sertraline (ZOLOFT) 50 MG tablet Take 50 mg by mouth daily.    [provider]  ?sulfamethoxazole-trimethoprim (BACTRIM DS) 800-160 MG tablet Take 1 tablet by mouth 2 (two) times daily. ?Patient not taking: Reported on 10/20/2020 02/02/16   Francine Graven, DO  ?VIAGRA 100 MG tablet Take 100 mg by mouth as needed for erectile dysfunction.  01/24/16   [provider]  ?   ? ?Allergies    ?Penicillins   ? ?Review of Systems   ?Review of Systems  ?Eyes:  Positive for visual disturbance.  ?All other systems reviewed and are negative. ? ?Physical Exam ?Updated Vital Signs ?BP (!) 107/48   Pulse 87   Temp 98.9 ?F (37.2 ?C) (Oral)   Resp 16   SpO2 98%  ?Physical Exam ?Vitals and nursing note reviewed.  ?Constitutional:   ?   General: He is not in acute distress. ?HENT:  ?   Head: Normocephalic and atraumatic.  ?Eyes:  ?   General: Vision grossly intact. Gaze aligned appropriately.     ?   Right eye: No foreign body.     ?   Left eye: No foreign body.  ?   Extraocular Movements: Extraocular movements intact.  ?   Conjunctiva/sclera: Conjunctivae normal.  ?   Pupils: Pupils are equal, round, and reactive to light.  ?   Right eye: No corneal abrasion or fluorescein uptake.  ?   Left eye: No corneal abrasion or fluorescein uptake.  ?   Visual Fields: Right eye visual fields normal and left eye visual fields normal.  ?   Comments: Visual Acuity ?Bilateral Distance: 20/25 ?R Distance: 20/25 ?L Distance: 20/40  ?Cardiovascular:  ?   Rate and Rhythm: Normal rate and regular rhythm.  ?Pulmonary:  ?   Effort: Pulmonary effort is normal. No respiratory distress.  ?Abdominal:  ?   General: There is no distension.  ?   Tenderness: There is no guarding.   ?Musculoskeletal:     ?   General: No deformity or signs of injury.  ?   Cervical back: Neck supple.  ?Skin: ?   Findings: No lesion or rash.  ?Neurological:  ?   General: No focal deficit present.  ?   Mental Status: He is alert and oriented to person, place, and time. Mental status is at baseline.  ?   Cranial Nerves: No cranial nerve deficit.  ?   Sensory: No sensory deficit.  ?   Motor: No weakness.  ?   Gait: Gait normal.  ?Psychiatric:     ?   Mood and Affect: Mood  normal.     ?   Behavior: Behavior normal.     ?   Thought Content: Thought content normal.     ?   Judgment: Judgment normal.  ? ? ?ED Results / Procedures / Treatments   ?Labs ?(all labs ordered are listed, but only abnormal results are displayed) ?Labs Reviewed - No data to display ? ?EKG ?None ? ?Radiology ?CT HEAD WO CONTRAST (5MM) ? ?Result Date: 08/17/2021 ?CLINICAL DATA:  66 year old male with headache, bilateral eye pain and blurred vision. EXAM: CT HEAD WITHOUT CONTRAST TECHNIQUE: Contiguous axial images were obtained from the base of the skull through the vertex without intravenous contrast. RADIATION DOSE REDUCTION: This exam was performed according to the departmental dose-optimization program which includes automated exposure control, adjustment of the mA and/or kV according to patient size and/or use of iterative reconstruction technique. COMPARISON:  None. FINDINGS: Brain: No evidence of acute infarction, hemorrhage, hydrocephalus, extra-axial collection or mass lesion/mass effect. Atrophy and mild probable chronic small-vessel white matter ischemic changes are noted. Vascular: Carotid atherosclerotic calcifications are noted. Skull: Normal. Negative for fracture or focal lesion. Sinuses/Orbits: No acute finding. Mucosal thickening in scattered ethmoid air cells noted. Other: None IMPRESSION: 1. No evidence of acute intracranial abnormality. 2. Atrophy and mild probable chronic small-vessel white matter ischemic changes.  Electronically Signed   By: Margarette Canada M.D.   On: 08/17/2021 15:13   ? ?Procedures ?Procedures  ? ? ?Medications Ordered in ED ?Medications - No data to display ? ?ED Course/ Medical Decision Making/ A&P ?  ?

## 2021-09-09 ENCOUNTER — Encounter (HOSPITAL_COMMUNITY): Payer: Self-pay

## 2021-09-09 ENCOUNTER — Emergency Department (HOSPITAL_COMMUNITY)
Admission: EM | Admit: 2021-09-09 | Discharge: 2021-09-10 | Disposition: A | Discharge status: Home / Self Care | Payer: No Typology Code available for payment source | Attending: Emergency Medicine | Admitting: Emergency Medicine

## 2021-09-09 ENCOUNTER — Other Ambulatory Visit: Payer: Self-pay

## 2021-09-09 DIAGNOSIS — H5713 Ocular pain, bilateral: Secondary | ICD-10-CM | POA: Insufficient documentation

## 2021-09-09 DIAGNOSIS — Z87891 Personal history of nicotine dependence: Secondary | ICD-10-CM | POA: Insufficient documentation

## 2021-09-09 NOTE — ED Provider Triage Note (Signed)
Emergency Medicine Provider Triage Evaluation Note ? ?Timothy [name] , a 66 y.o. male  was evaluated in triage.  Pt complains of eye pain.  Reports that today at 1400 while outside he had a sudden onset of bilateral eye pain.  Patient states that his vision became cloudy at that time as well.  Pain and cloudiness have persisted however pain has gotten gradually better. Patient reports that he has a history of cataracts and was not wearing his sunglasses today.  Patient denies any contact use. ? ?Review of Systems  ?Positive: Eye pain, visual disturbance ?Negative: Eye discharge, vision loss, diplopia, ? ?Physical Exam  ?BP 100/70 (BP Location: Right Arm)   Pulse (!) 126   Temp 98.8 ?F (37.1 ?C) (Oral)   Resp 16   SpO2 99%  ?Gen:   Awake, no distress   ?Resp:  Normal effort  ?MSK:   Moves extremities without difficulty  ?Other:  EOM intact bilaterally.  Pupils PERRL.  No periorbital edema or erythema. ? ?Medical Decision Making  ?Medically screening exam initiated at 7:10 PM.  Appropriate orders placed.  Timothy [name] was informed that the remainder of the evaluation will be completed by another provider, this initial triage assessment does not replace that evaluation, and the importance of remaining in the ED until their evaluation is complete. ? ?Per chart review patient seen with similar symptoms on 08/17/2021.  CT head obtained at that time showed no evidence of acute intracranial abnormality. ?  ?Loni Beckwith, PA-C ?09/09/21 1919 ? ?

## 2021-09-09 NOTE — ED Triage Notes (Signed)
Pt reports bilateral eye pain, light sensitivity, blurred vision, and double vision x 2 years.  pt states he had traumatic cataracts but it has healed. Pain has been worse today so he came to ER. No injury of redness to eyes. ?

## 2021-09-10 MED ORDER — TETRACAINE HCL 0.5 % OP SOLN
2.0000 [drp] | Freq: Once | OPHTHALMIC | Status: AC
  Administered 2021-09-10: 2 [drp] via OPHTHALMIC
  Filled 2021-09-10: qty 4

## 2021-09-10 MED ORDER — FLUORESCEIN SODIUM 1 MG OP STRP
1.0000 | ORAL_STRIP | Freq: Once | OPHTHALMIC | Status: AC
  Administered 2021-09-10: 1 via OPHTHALMIC
  Filled 2021-09-10: qty 1

## 2021-09-10 NOTE — ED Provider Notes (Signed)
?Timothy [name] ?Provider Note ? ? ?CSN: [id number] ?Arrival date & time: 09/09/21  1756 ? ?  ? ?History ? ?Chief Complaint  ?Patient presents with  ? Eye Pain  ? ? ?Timothy [name] is a 66 y.o. male with a history of tobacco use, schizophrenia, and cataracts who presents to the emergency department with complaints of eye pain and visual disturbance that occurred earlier today and is resolved at present.  Patient states he was outside in the bright sun when he started to have some pain in the bilateral eyes with some blurry vision.  This gradually improved over time and is now resolved at present.  He states he has a history of cataracts and was not wearing his as needed sunglasses today.  No specific alleviating or aggravating factors.  He has had similar symptoms in the past with cataract issues has been having these for a couple of years now,, seem to be worse earlier today prompting ED visit.  He denies contact lens use.  He denies headache, nausea, vomiting, numbness, tingling, weakness, or dizziness. ? ?HPI ? ?  ? ?Home Medications ?Prior to Admission medications   ?Medication Sig Start Date End Date Taking? Authorizing Provider  ?azithromycin (ZITHROMAX) 250 MG tablet Take 1 tablet (250 mg total) by mouth daily. Take first 2 tablets together, then 1 every day until finished. 03/01/21   Tedd Sias, PA  ?benzonatate (TESSALON) 100 MG capsule Take 1 capsule (100 mg total) by mouth every 8 (eight) hours. 03/01/21   Tedd Sias, PA  ?bisacodyl (DULCOLAX) 5 MG EC tablet Take 5 mg by mouth daily as needed for moderate constipation.    [provider]  ?Calcium Carb-Cholecalciferol (CALCIUM-VITAMIN D) 500-400 MG-UNIT TABS Take 1 tablet by mouth daily.    [provider]  ?cetirizine (ZYRTEC ALLERGY) 10 MG tablet Take 1 tablet (10 mg total) by mouth daily. 07/19/20   Hazel Sams, PA-C  ?cyclobenzaprine (FLEXERIL) 10 MG tablet Take 1 tablet (10 mg total)  by mouth 2 (two) times daily as needed for muscle spasms. 02/27/21   Gareth Morgan, MD  ?diazepam (VALIUM) 5 MG tablet Take 2.5 mg by mouth at bedtime as needed for anxiety. ?Patient not taking: Reported on 10/20/2020    [provider]  ?diclofenac Sodium (VOLTAREN) 1 % GEL Apply 2 g topically 4 (four) times daily. 11/20/20   Hans Eden, NP  ?diphenhydrAMINE (BENADRYL) 25 mg capsule Take 25 mg by mouth at bedtime as needed for itching or sleep.    [provider]  ?gabapentin (NEURONTIN) 300 MG capsule Take 1 capsule (300 mg total) by mouth 3 (three) times daily. ?Patient not taking: Reported on 10/20/2020 06/18/20   Jaynee Eagles, PA-C  ?haloperidol (HALDOL) 5 MG tablet Take 5 mg by mouth at bedtime.    [provider]  ?haloperidol decanoate (HALDOL DECANOATE) 50 MG/ML injection Inject 50 mg into the muscle every 28 (twenty-eight) days.    [provider]  ?hydroxypropyl methylcellulose / hypromellose (ISOPTO TEARS / GONIOVISC) 2.5 % ophthalmic solution Place 1 drop into both eyes 3 (three) times daily as needed for dry eyes. 08/17/21   Regan Lemming, MD  ?ketorolac (TORADOL) 10 MG tablet Take 1 tablet (10 mg total) by mouth every 6 (six) hours as needed. 06/27/21   Dorothyann Peng, PA-C  ?lactulose (CHRONULAC) 10 GM/15ML solution Take 20 g by mouth 3 (three) times a week.    [provider]  ?methylPREDNISolone (  MEDROL DOSEPAK) 4 MG TBPK tablet See instructions 02/27/21   Gareth Morgan, MD  ?naproxen (NAPROSYN) 500 MG tablet Take 1 tablet (500 mg total) by mouth 2 (two) times daily as needed. 02/23/21   Horton, Alvin Critchley, DO  ?omeprazole (PRILOSEC) 20 MG capsule Take 40 mg by mouth 2 (two) times daily before a meal.    [provider]  ?oxybutynin (DITROPAN) 5 MG tablet Take 5 mg by mouth 3 (three) times daily as needed for bladder spasms.    [provider]  ?oxyCODONE-acetaminophen (PERCOCET/ROXICET) 5-325 MG tablet Take 1 tablet by mouth every 6  (six) hours as needed for severe pain. 02/27/21   Gareth Morgan, MD  ?polyethylene glycol (MIRALAX / GLYCOLAX) packet Take 17 g by mouth 3 (three) times a week.    [provider]  ?promethazine-dextromethorphan (PROMETHAZINE-DM) 6.25-15 MG/5ML syrup Take 5 mLs by mouth 4 (four) times daily as needed for cough. 03/01/21   Tedd Sias, PA  ?pseudoephedrine (SUDAFED) 30 MG tablet Take 30 mg by mouth every 6 (six) hours as needed for congestion. ?Patient not taking: Reported on 10/20/2020    [provider]  ?sertraline (ZOLOFT) 50 MG tablet Take 50 mg by mouth daily.    [provider]  ?sulfamethoxazole-trimethoprim (BACTRIM DS) 800-160 MG tablet Take 1 tablet by mouth 2 (two) times daily. ?Patient not taking: Reported on 10/20/2020 02/02/16   Francine Graven, DO  ?VIAGRA 100 MG tablet Take 100 mg by mouth as needed for erectile dysfunction.  01/24/16   [provider]  ?   ? ?Allergies    ?Penicillins   ? ?Review of Systems   ?Review of Systems  ?Constitutional:  Negative for chills and fever.  ?Eyes:  Positive for pain and visual disturbance. Negative for discharge, redness and itching.  ?Respiratory:  Negative for shortness of breath.   ?Cardiovascular:  Negative for chest pain.  ?Gastrointestinal:  Negative for abdominal pain and vomiting.  ?Neurological:  Negative for dizziness, syncope, speech difficulty, weakness, light-headedness and numbness.  ?All other systems reviewed and are negative. ? ?Physical Exam ?Updated Vital Signs ?BP 125/80 (BP Location: Right Arm)   Pulse 65   Temp 98.3 ?F (36.8 ?C) (Oral)   Resp 17   Ht 6' (1.829 m)   Wt 124.7 kg   SpO2 100%   BMI 37.30 kg/m?  ?Physical Exam ?Vitals and nursing note reviewed.  ?Constitutional:   ?   General: He is not in acute distress. ?   Appearance: He is well-developed. He is not toxic-appearing.  ?HENT:  ?   Head: Normocephalic and atraumatic.  ?Eyes:  ?   General:     ?   Right eye: No discharge.     ?   Left  eye: No discharge.  ?   Conjunctiva/sclera: Conjunctivae normal.  ?   Comments: No periorbital erythema, swelling, or tenderness.  Pupils equal round and reactive to light.  Extraocular movements intact.  Fluorescein stain without uptake, no findings of corneal abrasion or ulceration.  No dendritic staining.  Negative Seidel sign. ?Visual Acuity ?Bilateral Near: 20/20 @ 40f Bilateral Distance: 20/20 @ 182fR Near: 12.5 @ 1061f Distance: 12.5 @ 74f62fNear: 32 @ 74ft17fistance: 32 @ 74ft 53frdiovascular:  ?   Rate and Rhythm: Normal rate and regular rhythm.  ?Pulmonary:  ?   Effort: No respiratory distress.  ?   Breath sounds: Normal breath sounds. No wheezing or rales.  ?Abdominal:  ?  General: There is no distension.  ?   Palpations: Abdomen is soft.  ?   Tenderness: There is no abdominal tenderness.  ?Musculoskeletal:  ?   Cervical back: Neck supple.  ?Skin: ?   General: Skin is warm and dry.  ?Neurological:  ?   Mental Status: He is alert.  ?   Comments: Clear speech.  CN II through XII grossly intact.  Sensation grossly tact bilateral upper and lower extremities, strength grossly tact bilateral upper and lower extremities.    ?Psychiatric:     ?   Behavior: Behavior normal.  ? ? ?ED Results / Procedures / Treatments   ?Labs ?(all labs ordered are listed, but only abnormal results are displayed) ?Labs Reviewed - No data to display ? ?EKG ?None ? ?Radiology ?No results found. ? ?Procedures ?Procedures  ? ? ?Medications Ordered in ED ?Medications  ?fluorescein ophthalmic strip 1 strip (has no administration in time range)  ?tetracaine (PONTOCAINE) 0.5 % ophthalmic solution 2 drop (has no administration in time range)  ? ? ?ED Course/ Medical Decision Making/ A&P ?  ?                        ?Medical Decision Making ?Risk ?Prescription drug management. ? ? ?Patient presents to the ED with complaints of eye pain and blurry vision which is resolved at present.  Initial tachycardia and soft BP resolved on exam.   ? ?Additional history obtained:  ?Chart & nursing note reviewed.  ?CT head without contrast was performed at prior ED visit, I viewed images- radiology impression:  ?1. No evidence of acute intracranial abn

## 2021-09-10 NOTE — Discharge Instructions (Signed)
You were seen in the emergency department today for eye pain.  Your work-up was overall reassuring.  Please be sure to wear your designated glasses at all times.  Follow-up with ophthalmology as soon as possible.  Return to the ER for new or worsening symptoms or any other concerns. ?

## 2021-11-04 ENCOUNTER — Ambulatory Visit (HOSPITAL_COMMUNITY)
Admission: EM | Admit: 2021-11-04 | Discharge: 2021-11-04 | Disposition: A | Discharge status: Home / Self Care | Payer: Medicare PPO | Attending: Physician Assistant | Admitting: Physician Assistant

## 2021-11-04 ENCOUNTER — Encounter (HOSPITAL_COMMUNITY): Payer: Self-pay | Admitting: Emergency Medicine

## 2021-11-04 DIAGNOSIS — G8929 Other chronic pain: Secondary | ICD-10-CM

## 2021-11-04 DIAGNOSIS — M545 Low back pain, unspecified: Secondary | ICD-10-CM | POA: Diagnosis not present

## 2021-11-04 DIAGNOSIS — L89316 Pressure-induced deep tissue damage of right buttock: Secondary | ICD-10-CM

## 2021-11-04 MED ORDER — MUPIROCIN CALCIUM 2 % EX CREA: 1.0000 | TOPICAL_CREAM | Freq: Two times a day (BID) | CUTANEOUS | 0 refills | Status: DC

## 2021-11-04 MED ORDER — DICLOFENAC SODIUM 75 MG PO TBEC: 75.0000 mg | DELAYED_RELEASE_TABLET | Freq: Two times a day (BID) | ORAL | 0 refills | Status: DC

## 2021-11-04 NOTE — Discharge Instructions (Addendum)
Advised to apply the Bactroban ointment lightly twice a day to the sore area on the right buttock. Advised to take the diclofenac 75 mg twice daily for lower back pain. Advised to follow-up with PCP or return to urgent care if symptoms fail to improve.

## 2021-11-04 NOTE — ED Provider Notes (Signed)
[place]    CSN: [id number] Arrival date & time: 11/04/21  1240      History   Chief Complaint Chief Complaint  Patient presents with   Wound Dehiscence   Back Pain    HPI Timothy [name] is a 66 y.o. male.   66 year old male presents with lower back pain and sore right outer thigh.  Patient relates he has a history of having chronic lower back pain, with intermittent episodes of pain running down both legs.  Patient relates he has had lower back pain across the middle of his back for the past several days that has gotten worse.  Patient relates he does take diclofenac and this does give him some relief from the lower back pain.  Patient relates he does have an appointment to be seen within the next 10 days by [facility] spine and pain specialist for evaluation of his back pain.  Patient relates he does have a history of having DJD and arthritis in the lower back.  Patient relates that he is bed bound quite a bit and is difficult for him to switch positions, lately he has noticed a sore developing on the right outer buttocks area.  He has concerned about this area and desires it to be checked.  Patient is not having any fever or chills.  [facility]ax number [phone], patient indicates that his date of birth and Social Security number needs to be referenced for it to be added to his records.   Back Pain   Past Medical History:  Diagnosis Date   Behavioral disorder    Cancer [facility])    Constipation    Gonorrhea     Patient Active Problem List   Diagnosis Date Noted   Constipation 02/02/2016   UTI (lower urinary tract infection)    Paranoid schizophrenia ([place]) 02/01/2016    Past Surgical History:  Procedure Laterality Date   BACK SURGERY         Home Medications    Prior to Admission medications   Medication Sig Start Date End Date Taking? Authorizing Provider  diclofenac (VOLTAREN) 75 MG EC  tablet Take 1 tablet (75 mg total) by mouth 2 (two) times daily. 11/04/21  Yes Nyoka Lint, PA-C  mupirocin cream (BACTROBAN) 2 % Apply 1 Application topically 2 (two) times daily. 11/04/21  Yes Nyoka Lint, PA-C  azithromycin (ZITHROMAX) 250 MG tablet Take 1 tablet (250 mg total) by mouth daily. Take first 2 tablets together, then 1 every day until finished. 03/01/21   Tedd Sias, PA  benzonatate (TESSALON) 100 MG capsule Take 1 capsule (100 mg total) by mouth every 8 (eight) hours. 03/01/21   Tedd Sias, PA  bisacodyl (DULCOLAX) 5 MG EC tablet Take 5 mg by mouth daily as needed for moderate constipation.    [provider]  Calcium Carb-Cholecalciferol (CALCIUM-VITAMIN D) 500-400 MG-UNIT TABS Take 1 tablet by mouth daily.    [provider]  cetirizine (ZYRTEC ALLERGY) 10 MG tablet Take 1 tablet (10 mg total) by mouth daily. 07/19/20   Hazel Sams, PA-C  cyclobenzaprine (FLEXERIL) 10 MG tablet Take 1 tablet (10 mg total) by mouth 2 (two) times daily as needed for muscle spasms. 02/27/21   Gareth Morgan, MD  diazepam (VALIUM) 5 MG tablet Take 2.5 mg by mouth at bedtime as needed for anxiety. Patient not taking: Reported on 10/20/2020    [provider]  diclofenac Sodium (VOLTAREN) 1 % GEL Apply 2 g topically 4 (four) times daily. 11/20/20   Hans Eden, NP  diphenhydrAMINE (BENADRYL) 25 mg capsule Take 25 mg by mouth at bedtime as needed for itching or sleep.    [provider]  gabapentin (NEURONTIN) 300 MG capsule Take 1 capsule (300 mg total) by mouth 3 (three) times daily. Patient not taking: Reported on 10/20/2020 06/18/20   Jaynee Eagles, PA-C  haloperidol (HALDOL) 5 MG tablet Take 5 mg by mouth at bedtime.    [provider]  haloperidol decanoate (HALDOL DECANOATE) 50 MG/ML injection Inject 50 mg into the muscle every 28 (twenty-eight) days.    [provider]  hydroxypropyl methylcellulose / hypromellose (ISOPTO TEARS /  GONIOVISC) 2.5 % ophthalmic solution Place 1 drop into both eyes 3 (three) times daily as needed for dry eyes. 08/17/21   Regan Lemming, MD  ketorolac (TORADOL) 10 MG tablet Take 1 tablet (10 mg total) by mouth every 6 (six) hours as needed. 06/27/21   Dorothyann Peng, PA-C  lactulose (CHRONULAC) 10 GM/15ML solution Take 20 g by mouth 3 (three) times a week.    [provider]  methylPREDNISolone (MEDROL DOSEPAK) 4 MG TBPK tablet See instructions 02/27/21   Gareth Morgan, MD  naproxen (NAPROSYN) 500 MG tablet Take 1 tablet (500 mg total) by mouth 2 (two) times daily as needed. 02/23/21   Horton, Drue Dun M, DO  omeprazole (PRILOSEC) 20 MG capsule Take 40 mg by mouth 2 (two) times daily before a meal.    [provider]  oxybutynin (DITROPAN) 5 MG tablet Take 5 mg by mouth 3 (three) times daily as needed for bladder spasms.    [provider]  oxyCODONE-acetaminophen (PERCOCET/ROXICET) 5-325 MG tablet Take 1 tablet by mouth every 6 (six) hours as needed for severe pain. 02/27/21   Gareth Morgan, MD  polyethylene glycol (MIRALAX / GLYCOLAX) packet Take 17 g by mouth 3 (three) times a week.    [provider]  promethazine-dextromethorphan (PROMETHAZINE-DM) 6.25-15 MG/5ML syrup Take 5 mLs by mouth 4 (four) times daily as needed for cough. 03/01/21   Tedd Sias, PA  pseudoephedrine (SUDAFED) 30 MG tablet Take 30 mg by mouth every 6 (six) hours as needed for congestion. Patient not taking: Reported on 10/20/2020    [provider]  sertraline (ZOLOFT) 50 MG tablet Take 50 mg by mouth daily.    [provider]  sulfamethoxazole-trimethoprim (BACTRIM DS) 800-160 MG tablet Take 1 tablet by mouth 2 (two) times daily. Patient not taking: Reported on 10/20/2020 02/02/16   Francine Graven, DO  VIAGRA 100 MG tablet Take 100 mg by mouth as needed for erectile dysfunction.  01/24/16   [provider]    Family History Family History  Family  history unknown: Yes    Social History Social History   Tobacco Use   Smoking status: Every Day    Packs/day: 1.00    Types: Cigarettes   Smokeless tobacco: Never  Vaping Use   Vaping Use: Never used  Substance Use Topics   Alcohol use: No   Drug use: No     Allergies   Penicillins   Review of Systems Review of Systems  Musculoskeletal:  Positive for back pain (lower L5/S1 area).  Skin:  Positive for wound (bedsore right upper outer thigh).     Physical Exam Triage Vital Signs ED Triage Vitals [11/04/21 1342]  Enc Vitals Group     BP 127/86  Pulse Rate 90     Resp 17     Temp 98.6 F (37 C)     Temp src      SpO2 97 %     Weight      Height      Head Circumference      Peak Flow      Pain Score 8     Pain Loc      Pain Edu?      Excl. in [place]?    No data found.  Updated Vital Signs BP 127/86   Pulse 90   Temp 98.6 F (37 C)   Resp 17   SpO2 97%   Visual Acuity Right Eye Distance:   Left Eye Distance:   Bilateral Distance:    Right Eye Near:   Left Eye Near:    Bilateral Near:     Physical Exam Constitutional:      Appearance: Normal appearance.  Musculoskeletal:     Comments: Back: Tenderness is palpated along the L5-S1 area the midline.  There is no swelling present or redness.  Range of motion is limited due to the patient's condition and difficulty with movement.  Skin:    Comments: HEENT: There is a 1.5 inch x 1/2 inch wound area at the upper inner thigh about 3 inches above the hip level.  There is no infection noted the area does have skin discoloration with mild breaking of the skin present.  No drainage.  Neurological:     Mental Status: He is alert.      UC Treatments / Results  Labs (all labs ordered are listed, but only abnormal results are displayed) Labs Reviewed - No data to display  EKG   Radiology No results found.  Procedures Procedures (including critical care time)  Medications Ordered in  UC Medications - No data to display  Initial Impression / Assessment and Plan / UC Course  I have reviewed the triage vital signs and the nursing notes.  Pertinent labs & imaging results that were available during my care of the patient were reviewed by me and considered in my medical decision making (see chart for details).    Plan: 1.  Patient advised to use the Bactroban ointment lightly twice daily to the area along with shifting positions frequently to treat the right hip wound. 2.  Patient advised take the diclofenac 75 mg 1 twice daily to help reduce lower back pain. 3.  Patient advised to follow-up with the [facility] spine and pain Center. 4.  Patient advised to follow-up with the [place] [facility] or return to urgent care if symptoms fail to improve. Final Clinical Impressions(s) / UC Diagnoses   Final diagnoses:  Pressure injury of deep tissue of right buttock  Chronic midline low back pain with bilateral sciatica     Discharge Instructions      Advised to apply the Bactroban ointment lightly twice a day to the sore area on the right buttock. Advised to take the diclofenac 75 mg twice daily for lower back pain. Advised to follow-up with PCP or return to urgent care if symptoms fail to improve.    ED Prescriptions     Medication Sig Dispense Auth. Provider   mupirocin cream (BACTROBAN) 2 % Apply 1 Application topically 2 (two) times daily. 15 g Nyoka Lint, PA-C   diclofenac (VOLTAREN) 75 MG EC tablet Take 1 tablet (75 mg total) by mouth 2 (two) times daily. 30 tablet Nyoka Lint, PA-C  PDMP not reviewed this encounter.   Nyoka Lint, PA-C 11/04/21 1433

## 2021-11-04 NOTE — ED Triage Notes (Addendum)
Pt is present today with an open wound on the lower right leg. Pt states that he noticed the open wound one week ago.   Pt states that he is also having recurring lower back pain.

## 2021-11-17 ENCOUNTER — Ambulatory Visit (HOSPITAL_COMMUNITY)
Admission: EM | Admit: 2021-11-17 | Discharge: 2021-11-17 | Disposition: A | Discharge status: Home / Self Care | Payer: Medicare PPO | Attending: Student | Admitting: Student

## 2021-11-17 ENCOUNTER — Encounter (HOSPITAL_COMMUNITY): Payer: Self-pay

## 2021-11-17 DIAGNOSIS — T24102A Burn of first degree of unspecified site of left lower limb, except ankle and foot, initial encounter: Secondary | ICD-10-CM

## 2021-11-17 DIAGNOSIS — Z23 Encounter for immunization: Secondary | ICD-10-CM | POA: Diagnosis not present

## 2021-11-17 DIAGNOSIS — T24101A Burn of first degree of unspecified site of right lower limb, except ankle and foot, initial encounter: Secondary | ICD-10-CM | POA: Diagnosis not present

## 2021-11-17 MED ORDER — TETANUS-DIPHTH-ACELL PERTUSSIS 5-2.5-18.5 LF-MCG/0.5 IM SUSY
PREFILLED_SYRINGE | INTRAMUSCULAR | Status: AC
  Filled 2021-11-17: qty 0.5

## 2021-11-17 MED ORDER — TETANUS-DIPHTH-ACELL PERTUSSIS 5-2.5-18.5 LF-MCG/0.5 IM SUSY
0.5000 mL | PREFILLED_SYRINGE | Freq: Once | INTRAMUSCULAR | Status: AC
Start: 2021-11-17 — End: 2021-11-17
  Administered 2021-11-17: 0.5 mL via INTRAMUSCULAR

## 2021-11-17 MED ORDER — SILVER SULFADIAZINE 1 % EX CREA: 1.0000 | TOPICAL_CREAM | Freq: Every day | CUTANEOUS | 0 refills | Status: AC
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
## 2021-11-17 NOTE — Discharge Instructions (Addendum)
-  Wash with gentle soap and water 1-2x daily. Follow with silvadene cream and an antistick bandage -Avoid hydrogen peroxide and alcohol to cleanse -Follow-up if new swelling, drainage, fevers
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
## 2021-11-29 ENCOUNTER — Encounter (HOSPITAL_COMMUNITY): Payer: Self-pay

## 2021-11-29 ENCOUNTER — Emergency Department (HOSPITAL_COMMUNITY)
Admission: EM | Admit: 2021-11-29 | Discharge: 2021-11-29 | Discharge status: Against Medical Advice | Payer: No Typology Code available for payment source | Attending: Emergency Medicine | Admitting: Emergency Medicine

## 2021-11-29 DIAGNOSIS — Z760 Encounter for issue of repeat prescription: Secondary | ICD-10-CM | POA: Diagnosis not present

## 2021-11-29 DIAGNOSIS — M79605 Pain in left leg: Secondary | ICD-10-CM | POA: Insufficient documentation

## 2021-11-29 DIAGNOSIS — M549 Dorsalgia, unspecified: Secondary | ICD-10-CM | POA: Diagnosis present

## 2021-11-29 DIAGNOSIS — M79604 Pain in right leg: Secondary | ICD-10-CM | POA: Insufficient documentation

## 2021-11-29 DIAGNOSIS — Z5321 Procedure and treatment not carried out due to patient leaving prior to being seen by health care provider: Secondary | ICD-10-CM | POA: Diagnosis not present

## 2021-11-29 NOTE — ED Triage Notes (Signed)
Pt states that he has chronic pain in his back and legs and that he wants refills on pain pills.

## 2021-11-29 NOTE — ED Notes (Signed)
Pt left stating the reason was due to wait times. Pt informed of risk and informed me that he would go to an urgent care instead,

## 2021-12-17 ENCOUNTER — Encounter (HOSPITAL_COMMUNITY): Payer: Self-pay

## 2021-12-17 ENCOUNTER — Ambulatory Visit (HOSPITAL_COMMUNITY)
Admission: RE | Admit: 2021-12-17 | Discharge: 2021-12-17 | Disposition: A | Discharge status: Home / Self Care | Payer: Medicare PPO | Source: Ambulatory Visit | Attending: Family Medicine | Admitting: Family Medicine

## 2021-12-17 VITALS — BP 104/64 | HR 92 | Temp 97.8°F | Resp 16 | Ht 72.0 in | Wt 275.0 lb

## 2021-12-17 DIAGNOSIS — G8929 Other chronic pain: Secondary | ICD-10-CM | POA: Diagnosis present

## 2021-12-17 DIAGNOSIS — M25511 Pain in right shoulder: Secondary | ICD-10-CM | POA: Diagnosis present

## 2021-12-17 DIAGNOSIS — R531 Weakness: Secondary | ICD-10-CM

## 2021-12-17 LAB — CBC WITH DIFFERENTIAL/PLATELET
Abs Immature Granulocytes: 0.01 10*3/uL (ref 0.00–0.07)
Basophils Absolute: 0.1 10*3/uL (ref 0.0–0.1)
Basophils Relative: 1 %
Eosinophils Absolute: 0.2 10*3/uL (ref 0.0–0.5)
Eosinophils Relative: 4 %
HCT: 36.7 % — ABNORMAL LOW (ref 39.0–52.0)
Hemoglobin: 12.5 g/dL — ABNORMAL LOW (ref 13.0–17.0)
Immature Granulocytes: 0 %
Lymphocytes Relative: 15 %
Lymphs Abs: 1 10*3/uL (ref 0.7–4.0)
MCH: 30.2 pg (ref 26.0–34.0)
MCHC: 34.1 g/dL (ref 30.0–36.0)
MCV: 88.6 fL (ref 80.0–100.0)
Monocytes Absolute: 0.4 10*3/uL (ref 0.1–1.0)
Monocytes Relative: 7 %
Neutro Abs: 4.7 10*3/uL (ref 1.7–7.7)
Neutrophils Relative %: 73 %
Platelets: 260 10*3/uL (ref 150–400)
RBC: 4.14 MIL/uL — ABNORMAL LOW (ref 4.22–5.81)
RDW: 13 % (ref 11.5–15.5)
WBC: 6.5 10*3/uL (ref 4.0–10.5)
nRBC: 0 % (ref 0.0–0.2)

## 2021-12-17 LAB — COMPREHENSIVE METABOLIC PANEL
ALT: 16 U/L (ref 0–44)
AST: 16 U/L (ref 15–41)
Albumin: 3.8 g/dL (ref 3.5–5.0)
Alkaline Phosphatase: 98 U/L (ref 38–126)
Anion gap: 9 (ref 5–15)
BUN: 9 mg/dL (ref 8–23)
CO2: 25 mmol/L (ref 22–32)
Calcium: 9.1 mg/dL (ref 8.9–10.3)
Chloride: 100 mmol/L (ref 98–111)
Creatinine, Ser: 1 mg/dL (ref 0.61–1.24)
GFR, Estimated: >60 mL/min (ref >60)
Glucose, Bld: 114 mg/dL — ABNORMAL HIGH (ref 70–99)
Potassium: 3.7 mmol/L (ref 3.5–5.1)
Sodium: 134 mmol/L — ABNORMAL LOW (ref 135–145)
Total Bilirubin: 0.4 mg/dL (ref 0.3–1.2)
Total Protein: 6.7 g/dL (ref 6.5–8.1)

## 2021-12-17 LAB — TSH: TSH: 1.207 u[IU]/mL (ref 0.350–4.500)

## 2021-12-17 MED ORDER — DICLOFENAC SODIUM 50 MG PO TBEC
50.0000 mg | DELAYED_RELEASE_TABLET | Freq: Two times a day (BID) | ORAL | 0 refills | Status: DC | PRN
  Filled 2021-12-17: qty 20, 10d supply, fill #0

## 2021-12-17 NOTE — Discharge Instructions (Addendum)
Take diclofenac 50 mg--2 times daily as needed for pain.  I have sent you a small amount, and you can see how much it helps.  Since you are already taking oxycodone and Lyrica regularly, I cannot see send any controlled substances for you  Drawn blood to check your blood counts, your sugar, sodium potassium, and kidney and liver function.  Also to check your thyroid function.  If anything is significantly abnormal, our staff will call you.  With you feeling so bad, you should call the [place] and get in with your primary care provider sooner than September.

## 2021-12-17 NOTE — ED Provider Notes (Signed)
[place]    CSN: [id number] Arrival date & time: 12/17/21  1833      History   Chief Complaint Chief Complaint  Patient presents with   Weakness   Shoulder Pain    right    HPI Timothy [name] is a 66 y.o. male.    Weakness Shoulder Pain  Here for weakness and fatigue that is been going on for about a month.  He does not have any nausea, vomiting, or diarrhea.  No dysuria or hematuria.  Appetites been okay.  He also notes worsening of his right shoulder pain.  The shoulder has been bothering him for at least couple of years, and he has not had any recent trauma.  Currently he is using diclofenac gel  Past Medical History:  Diagnosis Date   Behavioral disorder    Cancer [facility])    Constipation    Gonorrhea     Patient Active Problem List   Diagnosis Date Noted   Constipation 02/02/2016   UTI (lower urinary tract infection)    Paranoid schizophrenia ([place]) 02/01/2016    Past Surgical History:  Procedure Laterality Date   BACK SURGERY         Home Medications    Prior to Admission medications   Medication Sig Start Date End Date Taking? Authorizing Provider  diclofenac (VOLTAREN) 50 MG EC tablet Take 1 tablet (50 mg total) by mouth 2 (two) times daily as needed (pain). 12/17/21  Yes Farrah Skoda, Gwenlyn Perking, MD  bisacodyl (DULCOLAX) 5 MG EC tablet Take 5 mg by mouth daily as needed for moderate constipation.    [provider]  Calcium Carb-Cholecalciferol (CALCIUM-VITAMIN D) 500-400 MG-UNIT TABS Take 1 tablet by mouth daily.    [provider]  cetirizine (ZYRTEC ALLERGY) 10 MG tablet Take 1 tablet (10 mg total) by mouth daily. 07/19/20   Hazel Sams, PA-C  cyclobenzaprine (FLEXERIL) 10 MG tablet Take 1 tablet (10 mg total) by mouth 2 (two) times daily as needed for muscle spasms. 02/27/21   Gareth Morgan, MD  diclofenac Sodium (VOLTAREN) 1 % GEL Apply 2 g topically 4 (four) times daily. 11/20/20   Hans Eden, NP   diphenhydrAMINE (BENADRYL) 25 mg capsule Take 25 mg by mouth at bedtime as needed for itching or sleep.    [provider]  gabapentin (NEURONTIN) 300 MG capsule Take 1 capsule (300 mg total) by mouth 3 (three) times daily. Patient not taking: Reported on 10/20/2020 06/18/20   Jaynee Eagles, PA-C  haloperidol (HALDOL) 5 MG tablet Take 5 mg by mouth at bedtime.    [provider]  haloperidol decanoate (HALDOL DECANOATE) 50 MG/ML injection Inject 50 mg into the muscle every 28 (twenty-eight) days.    [provider]  hydroxypropyl methylcellulose / hypromellose (ISOPTO TEARS / GONIOVISC) 2.5 % ophthalmic solution Place 1 drop into both eyes 3 (three) times daily as needed for dry eyes. 08/17/21   Regan Lemming, MD  lactulose (CHRONULAC) 10 GM/15ML solution Take 20 g by mouth 3 (three) times a week.    [provider]  mupirocin cream (BACTROBAN) 2 % Apply 1 Application topically 2 (two) times daily. 11/04/21   Nyoka Lint, PA-C  omeprazole (PRILOSEC) 20 MG capsule Take 40 mg by mouth 2 (two) times daily before a meal.    [provider]  oxybutynin (DITROPAN) 5 MG tablet Take 5 mg by mouth 3 (three) times daily as needed for bladder spasms.    [provider]  oxyCODONE (ROXICODONE) 15 MG immediate release tablet Take 15 mg by mouth 3 (three) times daily as needed. 12/13/21   [provider]  polyethylene glycol (MIRALAX / GLYCOLAX) packet Take 17 g by mouth 3 (three) times a week.    [provider]  sertraline (ZOLOFT) 50 MG tablet Take 50 mg by mouth daily.    [provider]  VIAGRA 100 MG tablet Take 100 mg by mouth as needed for erectile dysfunction.  01/24/16   [provider]    Family History Family History  Family history unknown: Yes    Social History Social History   Tobacco Use   Smoking status: Every Day    Packs/day: 1.00    Types: Cigarettes   Smokeless tobacco: Never  Vaping Use   Vaping  Use: Never used  Substance Use Topics   Alcohol use: No   Drug use: No     Allergies   Penicillins   Review of Systems Review of Systems  Neurological:  Positive for weakness.     Physical Exam Triage Vital Signs ED Triage Vitals  Enc Vitals Group     BP 12/17/21 1902 104/64     Pulse Rate 12/17/21 1902 92     Resp 12/17/21 1902 16     Temp 12/17/21 1902 97.8 F (36.6 C)     Temp Source 12/17/21 1902 Oral     SpO2 12/17/21 1902 99 %     Weight 12/17/21 1906 275 lb (124.7 kg)     Height 12/17/21 1906 6' (1.829 m)     Head Circumference --      Peak Flow --      Pain Score 12/17/21 1905 10     Pain Loc --      Pain Edu? --      Excl. in [place]? --    No data found.  Updated Vital Signs BP 104/64 (BP Location: Right Arm)   Pulse 92   Temp 97.8 F (36.6 C) (Oral)   Resp 16   Ht 6' (1.829 m)   Wt 124.7 kg   SpO2 99%   BMI 37.30 kg/m   Visual Acuity Right Eye Distance:   Left Eye Distance:   Bilateral Distance:    Right Eye Near:   Left Eye Near:    Bilateral Near:     Physical Exam Vitals reviewed.  Constitutional:      General: He is not in acute distress.    Appearance: He is not ill-appearing, toxic-appearing or diaphoretic.  Eyes:     Extraocular Movements: Extraocular movements intact.     Pupils: Pupils are equal, round, and reactive to light.  Cardiovascular:     Rate and Rhythm: Normal rate and regular rhythm.  Pulmonary:     Effort: Pulmonary effort is normal.  Musculoskeletal:     Cervical back: Neck supple.     Comments: There is tenderness over the anterior and lateral right shoulder.  Lymphadenopathy:     Cervical: No cervical adenopathy.  Neurological:     General: No focal deficit present.     Mental Status: He is alert and oriented to person, place, and time.  Psychiatric:        Behavior: Behavior normal.      UC Treatments / Results  Labs (all labs ordered are listed, but only abnormal results are displayed) Labs  Reviewed  CBC WITH DIFFERENTIAL/PLATELET  COMPREHENSIVE METABOLIC PANEL  TSH    EKG  Radiology No results found.  Procedures Procedures (including critical care time)  Medications Ordered in UC Medications - No data to display  Initial Impression / Assessment and Plan / UC Course  I have reviewed the triage vital signs and the nursing notes.  Pertinent labs & imaging results that were available during my care of the patient were reviewed by me and considered in my medical decision making (see chart for details).     PMP reviewed, and he is already taking oxycodone and Lyrica regularly.  These were last filled at the end of July.  His renal function when last done was normal.  He relates that he is not on any blood thinners, and that he has not had any stomach ulcers.  I have sent him in 10 days worth of diclofenac orally for him to try.  X-ray result from last year was reviewed in epic, and showed arthritis.  We will do basic lab work, and we will let him know if anything significantly abnormal.  I have asked him to please follow-up with his primary care at the [facility] Final Clinical Impressions(s) / UC Diagnoses   Final diagnoses:  Weakness  Chronic right shoulder pain     Discharge Instructions      Take diclofenac 50 mg--2 times daily as needed for pain.  I have sent you a small amount, and you can see how much it helps.  Since you are already taking oxycodone and Lyrica regularly, I cannot see send any controlled substances for you  Drawn blood to check your blood counts, your sugar, sodium potassium, and kidney and liver function.  Also to check your thyroid function.  If anything is significantly abnormal, our staff will call you.  With you feeling so bad, you should call the [place] and get in with your primary care provider sooner than September.     ED Prescriptions     Medication Sig Dispense Auth. Provider   diclofenac (VOLTAREN) 50 MG EC tablet Take 1 tablet  (50 mg total) by mouth 2 (two) times daily as needed (pain). 20 tablet Andra Matsuo, Gwenlyn Perking, MD      I have reviewed the PDMP during this encounter.   Barrett Henle, MD 12/17/21 [phone]

## 2021-12-17 NOTE — ED Triage Notes (Signed)
Patient having right shoulder pain for 2 years. States it has gotten worse in the past few months.   Weakness all over for a month. States he feels very tired. No cold or flu like symptoms. No dietary or medication changes. No falls or accidents.

## 2021-12-18 ENCOUNTER — Other Ambulatory Visit (HOSPITAL_COMMUNITY): Payer: Self-pay

## 2022-02-07 ENCOUNTER — Encounter (HOSPITAL_COMMUNITY): Payer: Self-pay | Admitting: *Deleted

## 2022-02-07 ENCOUNTER — Other Ambulatory Visit: Payer: Self-pay

## 2022-02-07 ENCOUNTER — Emergency Department (HOSPITAL_COMMUNITY)
Admission: EM | Admit: 2022-02-07 | Discharge: 2022-02-08 | Disposition: A | Discharge status: Home / Self Care | Payer: No Typology Code available for payment source | Attending: Emergency Medicine | Admitting: Emergency Medicine

## 2022-02-07 DIAGNOSIS — M545 Low back pain, unspecified: Secondary | ICD-10-CM | POA: Insufficient documentation

## 2022-02-07 DIAGNOSIS — G8929 Other chronic pain: Secondary | ICD-10-CM | POA: Insufficient documentation

## 2022-02-07 DIAGNOSIS — M549 Dorsalgia, unspecified: Secondary | ICD-10-CM | POA: Diagnosis present

## 2022-02-07 LAB — CBC WITH DIFFERENTIAL/PLATELET
Abs Immature Granulocytes: 0.04 10*3/uL (ref 0.00–0.07)
Basophils Absolute: 0 10*3/uL (ref 0.0–0.1)
Basophils Relative: 0 %
Eosinophils Absolute: 0.3 10*3/uL (ref 0.0–0.5)
Eosinophils Relative: 5 %
HCT: 35.8 % — ABNORMAL LOW (ref 39.0–52.0)
Hemoglobin: 12.3 g/dL — ABNORMAL LOW (ref 13.0–17.0)
Immature Granulocytes: 1 %
Lymphocytes Relative: 14 %
Lymphs Abs: 0.9 10*3/uL (ref 0.7–4.0)
MCH: 31 pg (ref 26.0–34.0)
MCHC: 34.4 g/dL (ref 30.0–36.0)
MCV: 90.2 fL (ref 80.0–100.0)
Monocytes Absolute: 0.5 10*3/uL (ref 0.1–1.0)
Monocytes Relative: 7 %
Neutro Abs: 4.9 10*3/uL (ref 1.7–7.7)
Neutrophils Relative %: 73 %
Platelets: 234 10*3/uL (ref 150–400)
RBC: 3.97 MIL/uL — ABNORMAL LOW (ref 4.22–5.81)
RDW: 14 % (ref 11.5–15.5)
WBC: 6.7 10*3/uL (ref 4.0–10.5)
nRBC: 0 % (ref 0.0–0.2)

## 2022-02-07 LAB — BASIC METABOLIC PANEL
Anion gap: 10 (ref 5–15)
BUN: 7 mg/dL — ABNORMAL LOW (ref 8–23)
CO2: 24 mmol/L (ref 22–32)
Calcium: 8.6 mg/dL — ABNORMAL LOW (ref 8.9–10.3)
Chloride: 104 mmol/L (ref 98–111)
Creatinine, Ser: 0.85 mg/dL (ref 0.61–1.24)
GFR, Estimated: >60 mL/min (ref >60)
Glucose, Bld: 108 mg/dL — ABNORMAL HIGH (ref 70–99)
Potassium: 2.9 mmol/L — ABNORMAL LOW (ref 3.5–5.1)
Sodium: 138 mmol/L (ref 135–145)

## 2022-02-07 NOTE — ED Triage Notes (Signed)
The pt is c/o back pain for 2 years  hx spinal stenosis  he hurts everywhere

## 2022-02-07 NOTE — ED Provider Triage Note (Signed)
Emergency Medicine Provider Triage Evaluation Note  Timothy [name] , a 67 y.o. male  was evaluated in triage.  Pt complains of exacerbation of chronic lower back pain onset 4 days.  Notes that he was sitting for prolonged periods of time as well as having a lot of movement prior to the onset of his symptoms which caused an exacerbation of his chronic back pain.  Has a history of stenosis, spinal cord cancer, degenerative disc disease.  Takes 15 mg oxycodone 3 times daily however notes that he has been doubling and taking 30 mg 3 times daily due to the exacerbation of his symptoms.  Was told to come to the emergency department by the [facility] due to his worsening pain.  Patient notes that he walks with a walker intermittently due to his pain and numbness that has been ongoing for several years.  Denies nausea, incontinence.  Has had numbness to his bilateral lower extremities since 2000.  Review of Systems  Positive:  Negative:   Physical Exam  Ht 6' (1.829 m)   Wt 124.7 kg   BMI 37.29 kg/m  Gen:   Awake, no distress   Resp:  Normal effort  MSK:   Moves extremities without difficulty  Other:  No spinal tenderness to palpation.  Tenderness to palpation noted to musculature of lumbar region.  Decreased sensation noted to bilateral lower extremities however per patient this is chronic.  Strength 4/5 to bilateral lower extremities.  Medical Decision Making  Medically screening exam initiated at 5:30 PM.  Appropriate orders placed.  Timothy [name] was informed that the remainder of the evaluation will be completed by another provider, this initial triage assessment does not replace that evaluation, and the importance of remaining in the ED until their evaluation is complete.  Work-up initiated   Timothy [name] A, PA-C 02/07/22 1748

## 2022-02-08 MED ORDER — HYDROMORPHONE HCL 1 MG/ML IJ SOLN
1.0000 mg | Freq: Once | INTRAMUSCULAR | Status: AC
  Administered 2022-02-08: 1 mg via INTRAMUSCULAR
  Filled 2022-02-08: qty 1

## 2022-02-08 MED ORDER — ONDANSETRON 4 MG PO TBDP
8.0000 mg | ORAL_TABLET | Freq: Once | ORAL | Status: AC
Start: 2022-02-08 — End: 2022-02-08
  Administered 2022-02-08: 8 mg via ORAL
  Filled 2022-02-08: qty 2

## 2022-02-08 NOTE — ED Provider Notes (Signed)
[facility] EMERGENCY DEPARTMENT Provider Note   CSN: [id number] Arrival date & time: 02/07/22  1706     History  Chief Complaint  Patient presents with   Back Pain    Timothy [name] is a 66 y.o. male.  Patient presents to the emergency department for evaluation of multiple problems.  Patient reports that he has a history of chronic back pain secondary to severe spinal stenosis and chronic right shoulder problems.  He is here with increased pain in both areas.  Patient has pain management at [facility], ran out of his pain meds on the 14th.  No new injury.  Shoulder felt better after he iced it but still hurts.  Back pain is similar to his chronic back pain, no numbness, tingling, weakness.       Home Medications Prior to Admission medications   Medication Sig Start Date End Date Taking? Authorizing Provider  bisacodyl (DULCOLAX) 5 MG EC tablet Take 5 mg by mouth daily as needed for moderate constipation.    [provider]  Calcium Carb-Cholecalciferol (CALCIUM-VITAMIN D) 500-400 MG-UNIT TABS Take 1 tablet by mouth daily.    [provider]  cetirizine (ZYRTEC ALLERGY) 10 MG tablet Take 1 tablet (10 mg total) by mouth daily. 07/19/20   Hazel Sams, PA-C  cyclobenzaprine (FLEXERIL) 10 MG tablet Take 1 tablet (10 mg total) by mouth 2 (two) times daily as needed for muscle spasms. 02/27/21   Gareth Morgan, MD  diclofenac (VOLTAREN) 50 MG EC tablet Take 1 tablet (50 mg total) by mouth 2 (two) times daily as needed (pain). 12/17/21   Barrett Henle, MD  diclofenac Sodium (VOLTAREN) 1 % GEL Apply 2 g topically 4 (four) times daily. 11/20/20   Hans Eden, NP  diphenhydrAMINE (BENADRYL) 25 mg capsule Take 25 mg by mouth at bedtime as needed for itching or sleep.    [provider]  gabapentin (NEURONTIN) 300 MG capsule Take 1 capsule (300 mg total) by mouth 3 (three) times daily. Patient not taking: Reported on 10/20/2020  06/18/20   Jaynee Eagles, PA-C  haloperidol (HALDOL) 5 MG tablet Take 5 mg by mouth at bedtime.    [provider]  haloperidol decanoate (HALDOL DECANOATE) 50 MG/ML injection Inject 50 mg into the muscle every 28 (twenty-eight) days.    [provider]  hydroxypropyl methylcellulose / hypromellose (ISOPTO TEARS / GONIOVISC) 2.5 % ophthalmic solution Place 1 drop into both eyes 3 (three) times daily as needed for dry eyes. 08/17/21   Regan Lemming, MD  lactulose (CHRONULAC) 10 GM/15ML solution Take 20 g by mouth 3 (three) times a week.    [provider]  mupirocin cream (BACTROBAN) 2 % Apply 1 Application topically 2 (two) times daily. 11/04/21   Nyoka Lint, PA-C  omeprazole (PRILOSEC) 20 MG capsule Take 40 mg by mouth 2 (two) times daily before a meal.    [provider]  oxybutynin (DITROPAN) 5 MG tablet Take 5 mg by mouth 3 (three) times daily as needed for bladder spasms.    [provider]  oxyCODONE (ROXICODONE) 15 MG immediate release tablet Take 15 mg by mouth 3 (three) times daily as needed. 12/13/21   [provider]  polyethylene glycol (MIRALAX / GLYCOLAX) packet Take 17 g by mouth 3 (three) times a week.    [provider]  sertraline (ZOLOFT) 50 MG tablet Take 50 mg by mouth daily.    [provider]  VIAGRA  100 MG tablet Take 100 mg by mouth as needed for erectile dysfunction.  01/24/16   [provider]      Allergies    Penicillins    Review of Systems   Review of Systems  Physical Exam Updated Vital Signs BP 121/69   Pulse 72   Temp 97.8 F (36.6 C)   Resp 16   Ht 6' (1.829 m)   Wt 124.7 kg   SpO2 99%   BMI 37.29 kg/m  Physical Exam Vitals and nursing note reviewed.  Constitutional:      General: He is not in acute distress.    Appearance: He is well-developed.  HENT:     Head: Normocephalic and atraumatic.     Mouth/Throat:     Mouth: Mucous membranes are moist.  Eyes:     General:  Vision grossly intact. Gaze aligned appropriately.     Extraocular Movements: Extraocular movements intact.     Conjunctiva/sclera: Conjunctivae normal.  Cardiovascular:     Rate and Rhythm: Normal rate and regular rhythm.     Pulses: Normal pulses.     Heart sounds: Normal heart sounds, S1 normal and S2 normal. No murmur heard.    No friction rub. No gallop.  Pulmonary:     Effort: Pulmonary effort is normal. No respiratory distress.     Breath sounds: Normal breath sounds.  Abdominal:     Palpations: Abdomen is soft.     Tenderness: There is no abdominal tenderness. There is no guarding or rebound.     Hernia: No hernia is present.  Musculoskeletal:        General: No swelling.     Right shoulder: Tenderness present. No swelling or deformity. Decreased range of motion.     Cervical back: Full passive range of motion without pain, normal range of motion and neck supple. No pain with movement, spinous process tenderness or muscular tenderness. Normal range of motion.     Right lower leg: No edema.     Left lower leg: No edema.  Skin:    General: Skin is warm and dry.     Capillary Refill: Capillary refill takes less than 2 seconds.     Findings: No ecchymosis, erythema, lesion or wound.  Neurological:     Mental Status: He is alert and oriented to person, place, and time.     GCS: GCS eye subscore is 4. GCS verbal subscore is 5. GCS motor subscore is 6.     Cranial Nerves: Cranial nerves 2-12 are intact.     Sensory: Sensation is intact.     Motor: Motor function is intact. No weakness or abnormal muscle tone.     Coordination: Coordination is intact.  Psychiatric:        Mood and Affect: Mood normal.        Speech: Speech normal.        Behavior: Behavior normal.     ED Results / Procedures / Treatments   Labs (all labs ordered are listed, but only abnormal results are displayed) Labs Reviewed  BASIC METABOLIC PANEL - Abnormal; Notable for the following components:       Result Value   Potassium 2.9 (*)    Glucose, Bld 108 (*)    BUN 7 (*)    Calcium 8.6 (*)    All other components within normal limits  CBC WITH DIFFERENTIAL/PLATELET - Abnormal; Notable for the following components:   RBC 3.97 (*)    Hemoglobin 12.3 (*)  HCT 35.8 (*)    All other components within normal limits    EKG None  Radiology No results found.  Procedures Procedures    Medications Ordered in ED Medications  HYDROmorphone (DILAUDID) injection 1 mg (has no administration in time range)  ondansetron (ZOFRAN-ODT) disintegrating tablet 8 mg (has no administration in time range)    ED Course/ Medical Decision Making/ A&P                           Medical Decision Making  Patient presents to the ER with musculoskeletal back pain. Examination reveals back tenderness without any associated neurologic findings. Patient's strength, sensation and reflexes were normal. There is no evidence of saddle anesthesia. Patient does not have a foot drop. Patient has not experienced any change in bowel or bladder function. As such, patient did not require any imaging or further studies. Patient was treated with analgesia.  Patient also complaining of right shoulder pain, reports that he was told that he needs a shoulder replacement.  No falls or direct injury.        Final Clinical Impression(s) / ED Diagnoses Final diagnoses:  Chronic low back pain, unspecified back pain laterality, unspecified whether sciatica present    Rx / DC Orders ED Discharge Orders     None         Zailen Albarran, Gwenyth Allegra, MD 02/08/22 0004

## 2022-05-16 ENCOUNTER — Ambulatory Visit (HOSPITAL_COMMUNITY)
Admission: RE | Admit: 2022-05-16 | Discharge: 2022-05-16 | Discharge status: Against Medical Advice | Payer: No Typology Code available for payment source | Source: Ambulatory Visit | Attending: Internal Medicine | Admitting: Internal Medicine

## 2022-05-16 NOTE — ED Triage Notes (Signed)
Patient was in trying to get into wheel char and feel backwards and hit his head on chair yesterday and was bleeding a tiny bit but stopped once cleaned it. Patient states he did not black out or faint but would like to have it checked to make sure there is no blood clot or brain bleed.

## 2022-05-16 NOTE — ED Notes (Signed)
Per Janetta Hora PA, after she saw patient in triage pt informed her he was going home and left as didn't want to wait to be seen.

## 2022-05-16 NOTE — ED Provider Notes (Signed)
Called to triage by Juliette Mangle for evaluation  Patient reports he was crouched down, hit the back of his head on wheelchair. Occurred yesterday morning, over 24 hours ago. Did not lose consciousness. Recalls the entire event. No vomiting or change in mental status. Does not take blood thinner.  I evaluated patient and deemed him appropriate to stay at the urgent care for evaluation.   Patient reports he does not want to wait to see a provider. I recommended several times patient stay to be seen. Patient reports he would rather go home. Discussed with patient if he chooses to leave, to please monitor for any worsening symptoms and go directly to the ED if they occur. Patient verbalizes understanding.    Oveta Idris, Vernice Jefferson 05/16/22 1825

## 2022-05-24 ENCOUNTER — Encounter (HOSPITAL_COMMUNITY): Payer: Self-pay | Admitting: Emergency Medicine

## 2022-05-24 ENCOUNTER — Telehealth (HOSPITAL_COMMUNITY): Payer: Self-pay | Admitting: Emergency Medicine

## 2022-05-24 ENCOUNTER — Ambulatory Visit (HOSPITAL_COMMUNITY)
Admission: EM | Admit: 2022-05-24 | Discharge: 2022-05-24 | Disposition: A | Discharge status: Home / Self Care | Payer: No Typology Code available for payment source | Attending: Emergency Medicine | Admitting: Emergency Medicine

## 2022-05-24 DIAGNOSIS — N390 Urinary tract infection, site not specified: Secondary | ICD-10-CM | POA: Insufficient documentation

## 2022-05-24 LAB — POCT URINALYSIS DIPSTICK, ED / UC
Bilirubin Urine: NEGATIVE
Glucose, UA: NEGATIVE mg/dL
Ketones, ur: NEGATIVE mg/dL
Nitrite: POSITIVE — AB
Protein, ur: NEGATIVE mg/dL
Specific Gravity, Urine: 1.01 (ref 1.005–1.030)
Urobilinogen, UA: 1 mg/dL (ref 0.0–1.0)
pH: 7.5 (ref 5.0–8.0)

## 2022-05-24 MED ORDER — CIPROFLOXACIN HCL 500 MG PO TABS: 500.0000 mg | ORAL_TABLET | Freq: Two times a day (BID) | ORAL | 0 refills | Status: DC

## 2022-05-24 MED ORDER — PHENAZOPYRIDINE HCL 200 MG PO TABS: 200.0000 mg | ORAL_TABLET | Freq: Three times a day (TID) | ORAL | 0 refills | Status: DC

## 2022-05-24 NOTE — Discharge Instructions (Signed)
Rest,push fluids, take antibiotic(cipro) as directed, pyridium as directed for discomfort. Follow up with PC for PSA and recheck form today's visit. Go to ER if you develop fever,unable to keep meds down, or worsening issues, etc)

## 2022-05-24 NOTE — ED Triage Notes (Signed)
Pt reports that having lower abd pains and cloudy urine that started today.

## 2022-05-24 NOTE — ED Provider Notes (Signed)
[facility]    CSN: [id number] Arrival date & time: 05/24/22  1426      History   Chief Complaint Chief Complaint  Patient presents with   Abdominal Pain    HPI Timothy [name] is a 67 y.o. male.   67 year old male patient, Timothy [name], presents to urgent care with chief complaint of suprapubic abdominal pain and cloudy urine that started this morning.  Patient has a history of UTIs and prostate cancer.  Denies any back pain ,nausea,vomiting or fevers.  No treatment tried prior to arrival  The history is provided by the patient. No language interpreter was used.    Past Medical History:  Diagnosis Date   Behavioral disorder    Cancer [facility])    Constipation    Gonorrhea     Patient Active Problem List   Diagnosis Date Noted   Constipation 02/02/2016   Acute UTI (urinary tract infection)    Paranoid schizophrenia ([place]) 02/01/2016    Past Surgical History:  Procedure Laterality Date   BACK SURGERY         Home Medications    Prior to Admission medications   Medication Sig Start Date End Date Taking? Authorizing Provider  ciprofloxacin (CIPRO) 500 MG tablet Take 1 tablet (500 mg total) by mouth 2 (two) times daily. 05/24/36  Yes Hadassah Rana, Jeanett Schlein, NP  phenazopyridine (PYRIDIUM) 200 MG tablet Take 1 tablet (200 mg total) by mouth 3 (three) times daily. 08/23/63  Yes Ninamarie Keel, Jeanett Schlein, NP  bisacodyl (DULCOLAX) 5 MG EC tablet Take 5 mg by mouth daily as needed for moderate constipation.    [provider]  Calcium Carb-Cholecalciferol (CALCIUM-VITAMIN D) 500-400 MG-UNIT TABS Take 1 tablet by mouth daily.    [provider]  cetirizine (ZYRTEC ALLERGY) 10 MG tablet Take 1 tablet (10 mg total) by mouth daily. 07/19/20   Hazel Sams, PA-C  cyclobenzaprine (FLEXERIL) 10 MG tablet Take 1 tablet (10 mg total) by mouth 2 (two) times daily as needed for muscle spasms. 02/27/21   Gareth Morgan, MD  diclofenac (VOLTAREN) 50 MG EC tablet Take 1  tablet (50 mg total) by mouth 2 (two) times daily as needed (pain). 12/17/21   Barrett Henle, MD  diclofenac Sodium (VOLTAREN) 1 % GEL Apply 2 g topically 4 (four) times daily. 11/20/20   Hans Eden, NP  diphenhydrAMINE (BENADRYL) 25 mg capsule Take 25 mg by mouth at bedtime as needed for itching or sleep.    [provider]  gabapentin (NEURONTIN) 300 MG capsule Take 1 capsule (300 mg total) by mouth 3 (three) times daily. Patient not taking: Reported on 10/20/2020 06/18/20   Jaynee Eagles, PA-C  haloperidol (HALDOL) 5 MG tablet Take 5 mg by mouth at bedtime.    [provider]  haloperidol decanoate (HALDOL DECANOATE) 50 MG/ML injection Inject 50 mg into the muscle every 28 (twenty-eight) days.    [provider]  hydroxypropyl methylcellulose / hypromellose (ISOPTO TEARS / GONIOVISC) 2.5 % ophthalmic solution Place 1 drop into both eyes 3 (three) times daily as needed for dry eyes. 08/17/21   Regan Lemming, MD  lactulose (CHRONULAC) 10 GM/15ML solution Take 20 g by mouth 3 (three) times a week.    [provider]  mupirocin cream (BACTROBAN) 2 % Apply 1 Application topically 2 (two) times daily. 11/04/21   Nyoka Lint, PA-C  omeprazole (PRILOSEC) 20 MG capsule Take 40 mg by mouth 2 (two) times daily before a meal.  [provider]  oxybutynin (DITROPAN) 5 MG tablet Take 5 mg by mouth 3 (three) times daily as needed for bladder spasms.    [provider]  oxyCODONE (ROXICODONE) 15 MG immediate release tablet Take 15 mg by mouth 3 (three) times daily as needed. 12/13/21   [provider]  polyethylene glycol (MIRALAX / GLYCOLAX) packet Take 17 g by mouth 3 (three) times a week.    [provider]  sertraline (ZOLOFT) 50 MG tablet Take 50 mg by mouth daily.    [provider]  VIAGRA 100 MG tablet Take 100 mg by mouth as needed for erectile dysfunction.  01/24/16   [provider]    Family History Family  History  Family history unknown: Yes    Social History Social History   Tobacco Use   Smoking status: Every Day    Packs/day: 1.00    Types: Cigarettes   Smokeless tobacco: Never  Vaping Use   Vaping Use: Never used  Substance Use Topics   Alcohol use: No   Drug use: No     Allergies   Penicillins   Review of Systems Review of Systems  Gastrointestinal:  Positive for abdominal pain. Negative for constipation, diarrhea, nausea and vomiting.  Genitourinary:  Positive for dysuria.  All other systems reviewed and are negative.    Physical Exam Triage Vital Signs ED Triage Vitals  Enc Vitals Group     BP 05/24/22 1435 135/89     Pulse Rate 05/24/22 1435 86     Resp 05/24/22 1435 19     Temp 05/24/22 1435 97.9 F (36.6 C)     Temp Source 05/24/22 1435 Oral     SpO2 05/24/22 1435 96 %     Weight --      Height --      Head Circumference --      Peak Flow --      Pain Score 05/24/22 1433 4     Pain Loc --      Pain Edu? --      Excl. in [place]? --    No data found.  Updated Vital Signs BP 135/89 (BP Location: Right Arm)   Pulse 86   Temp 97.9 F (36.6 C) (Oral)   Resp 19   SpO2 96%   Visual Acuity Right Eye Distance:   Left Eye Distance:   Bilateral Distance:    Right Eye Near:   Left Eye Near:    Bilateral Near:     Physical Exam Vitals and nursing note reviewed.  Constitutional:      General: He is not in acute distress.    Appearance: He is well-developed and well-groomed.  HENT:     Head: Normocephalic and atraumatic.  Eyes:     Conjunctiva/sclera: Conjunctivae normal.  Cardiovascular:     Rate and Rhythm: Normal rate and regular rhythm.     Pulses: Normal pulses.     Heart sounds: Normal heart sounds. No murmur heard. Pulmonary:     Effort: Pulmonary effort is normal. No respiratory distress.     Breath sounds: Normal breath sounds and air entry.  Abdominal:     Palpations: Abdomen is soft.     Tenderness: There is abdominal tenderness  in the suprapubic area. There is no guarding or rebound.  Musculoskeletal:        General: No swelling.     Cervical back: Neck supple.  Skin:    General: Skin is warm and  dry.     Capillary Refill: Capillary refill takes less than 2 seconds.  Neurological:     General: No focal deficit present.     Mental Status: He is alert and oriented to person, place, and time.     GCS: GCS eye subscore is 4. GCS verbal subscore is 5. GCS motor subscore is 6.  Psychiatric:        Attention and Perception: Attention normal.        Mood and Affect: Mood normal.        Speech: Speech normal.        Behavior: Behavior normal. Behavior is cooperative.      UC Treatments / Results  Labs (all labs ordered are listed, but only abnormal results are displayed) Labs Reviewed  POCT URINALYSIS DIPSTICK, ED / UC - Abnormal; Notable for the following components:      Result Value   Hgb urine dipstick SMALL (*)    Nitrite POSITIVE (*)    Leukocytes,Ua LARGE (*)    All other components within normal limits    EKG   Radiology No results found.  Procedures Procedures (including critical care time)  Medications Ordered in UC Medications - No data to display  Initial Impression / Assessment and Plan / UC Course  I have reviewed the triage vital signs and the nursing notes.  Pertinent labs & imaging results that were available during my care of the patient were reviewed by me and considered in my medical decision making (see chart for details).     Ddx: UTI,suprapubic pain Final Clinical Impressions(s) / UC Diagnoses   Final diagnoses:  Acute UTI (urinary tract infection)     Discharge Instructions      Rest,push fluids, take antibiotic(cipro) as directed, pyridium as directed for discomfort. Follow up with PC for PSA and recheck form today's visit. Go to ER if you develop fever,unable to keep meds down, or worsening issues, etc)     ED Prescriptions     Medication Sig Dispense  Auth. Provider   ciprofloxacin (CIPRO) 500 MG tablet Take 1 tablet (500 mg total) by mouth 2 (two) times daily. 14 tablet Leory Allinson, NP   phenazopyridine (PYRIDIUM) 200 MG tablet Take 1 tablet (200 mg total) by mouth 3 (three) times daily. 6 tablet Danayah Smyre, Jeanett Schlein, NP      PDMP not reviewed this encounter.   Tori Milks, NP 13/24/40 1844

## 2022-05-26 ENCOUNTER — Telehealth (HOSPITAL_COMMUNITY): Payer: Self-pay | Admitting: Emergency Medicine

## 2022-05-26 LAB — URINE CULTURE: Culture: >=100000 — AB

## 2022-05-26 MED ORDER — SULFAMETHOXAZOLE-TRIMETHOPRIM 800-160 MG PO TABS: 1.0000 | ORAL_TABLET | Freq: Two times a day (BID) | ORAL | 0 refills | Status: AC

## 2022-05-27 ENCOUNTER — Encounter (HOSPITAL_COMMUNITY): Payer: Self-pay

## 2022-05-27 ENCOUNTER — Other Ambulatory Visit: Payer: Self-pay

## 2022-05-27 ENCOUNTER — Ambulatory Visit (HOSPITAL_COMMUNITY)
Admission: RE | Admit: 2022-05-27 | Discharge: 2022-05-27 | Disposition: A | Discharge status: Home / Self Care | Payer: No Typology Code available for payment source | Source: Ambulatory Visit | Attending: Family Medicine | Admitting: Family Medicine

## 2022-05-27 ENCOUNTER — Emergency Department (HOSPITAL_COMMUNITY)
Admission: EM | Admit: 2022-05-27 | Discharge: 2022-05-28 | Disposition: A | Discharge status: Home / Self Care | Payer: No Typology Code available for payment source | Attending: Emergency Medicine | Admitting: Emergency Medicine

## 2022-05-27 ENCOUNTER — Ambulatory Visit (INDEPENDENT_AMBULATORY_CARE_PROVIDER_SITE_OTHER): Payer: No Typology Code available for payment source

## 2022-05-27 VITALS — BP 137/87 | HR 115 | Temp 99.2°F | Resp 20

## 2022-05-27 DIAGNOSIS — J181 Lobar pneumonia, unspecified organism: Secondary | ICD-10-CM | POA: Insufficient documentation

## 2022-05-27 DIAGNOSIS — J189 Pneumonia, unspecified organism: Secondary | ICD-10-CM

## 2022-05-27 DIAGNOSIS — T887XXA Unspecified adverse effect of drug or medicament, initial encounter: Secondary | ICD-10-CM | POA: Diagnosis not present

## 2022-05-27 DIAGNOSIS — N3090 Cystitis, unspecified without hematuria: Secondary | ICD-10-CM | POA: Diagnosis not present

## 2022-05-27 DIAGNOSIS — R000 Tachycardia, unspecified: Secondary | ICD-10-CM | POA: Diagnosis not present

## 2022-05-27 DIAGNOSIS — N3000 Acute cystitis without hematuria: Secondary | ICD-10-CM | POA: Insufficient documentation

## 2022-05-27 DIAGNOSIS — Z20822 Contact with and (suspected) exposure to covid-19: Secondary | ICD-10-CM | POA: Insufficient documentation

## 2022-05-27 DIAGNOSIS — R0902 Hypoxemia: Secondary | ICD-10-CM

## 2022-05-27 LAB — POCT URINALYSIS DIPSTICK, ED / UC
Bilirubin Urine: NEGATIVE
Glucose, UA: NEGATIVE mg/dL
Ketones, ur: NEGATIVE mg/dL
Leukocytes,Ua: NEGATIVE
Nitrite: NEGATIVE
Protein, ur: NEGATIVE mg/dL
Specific Gravity, Urine: <=1.005 (ref 1.005–1.030)
Urobilinogen, UA: 0.2 mg/dL (ref 0.0–1.0)
pH: 5.5 (ref 5.0–8.0)

## 2022-05-27 LAB — RESP PANEL BY RT-PCR (RSV, FLU A&B, COVID)  RVPGX2
Influenza A by PCR: NEGATIVE
Influenza B by PCR: NEGATIVE
Resp Syncytial Virus by PCR: NEGATIVE
SARS Coronavirus 2 by RT PCR: NEGATIVE

## 2022-05-27 LAB — BASIC METABOLIC PANEL
Anion gap: 8 (ref 5–15)
BUN: <5 mg/dL — ABNORMAL LOW (ref 8–23)
CO2: 23 mmol/L (ref 22–32)
Calcium: 9 mg/dL (ref 8.9–10.3)
Chloride: 103 mmol/L (ref 98–111)
Creatinine, Ser: 0.96 mg/dL (ref 0.61–1.24)
GFR, Estimated: >60 mL/min (ref >60)
Glucose, Bld: 84 mg/dL (ref 70–99)
Potassium: 3.7 mmol/L (ref 3.5–5.1)
Sodium: 134 mmol/L — ABNORMAL LOW (ref 135–145)

## 2022-05-27 LAB — POC INFLUENZA A AND B ANTIGEN (URGENT CARE ONLY)
Influenza A Ag: NEGATIVE
Influenza B Ag: NEGATIVE

## 2022-05-27 LAB — CBC
HCT: 41.1 % (ref 39.0–52.0)
Hemoglobin: 13.9 g/dL (ref 13.0–17.0)
MCH: 30.1 pg (ref 26.0–34.0)
MCHC: 33.8 g/dL (ref 30.0–36.0)
MCV: 89 fL (ref 80.0–100.0)
Platelets: 210 10*3/uL (ref 150–400)
RBC: 4.62 MIL/uL (ref 4.22–5.81)
RDW: 13.4 % (ref 11.5–15.5)
WBC: 5.6 10*3/uL (ref 4.0–10.5)
nRBC: 0 % (ref 0.0–0.2)

## 2022-05-27 LAB — TROPONIN I (HIGH SENSITIVITY): Troponin I (High Sensitivity): 21 ng/L — ABNORMAL HIGH (ref <18)

## 2022-05-27 NOTE — ED Provider Notes (Signed)
[facility]   [id number] 05/27/22 Arrival Time: 1440  ASSESSMENT & PLAN:  1. Hypoxia   2. Tachycardia   3. Cystitis   4. Non-dose-related adverse effect of medication, initial encounter    Sp02 hovering around 88% on RA; up to 96% on 2L [place].  I have personally viewed the imaging studies ordered this visit. No acute changes.  Results for orders placed or performed during the hospital encounter of 05/27/22  POC Urinalysis dipstick  Result Value Ref Range   Glucose, UA NEGATIVE NEGATIVE mg/dL   Bilirubin Urine NEGATIVE NEGATIVE   Ketones, ur NEGATIVE NEGATIVE mg/dL   Specific Gravity, Urine <=1.005 1.005 - 1.030   Hgb urine dipstick MODERATE (A) NEGATIVE   pH 5.5 5.0 - 8.0   Protein, ur NEGATIVE NEGATIVE mg/dL   Urobilinogen, UA 0.2 0.0 - 1.0 mg/dL   Nitrite NEGATIVE NEGATIVE   Leukocytes,Ua NEGATIVE NEGATIVE  POC Influenza A & B Ag (Urgent Care)  Result Value Ref Range   Influenza A Ag Negative Negative   Influenza B Ag Negative Negative   With tachycardia and hypoxia will send to ED for further evaluation. Cannot r/o PE or cardiac etiology. Discussed. He wishes to proceed via POV. Stable upon discharge.  Only 1-2 doses of Bactrim for + UTI via culture. Still has microscopic hematuria. Urine culture sent. Reports symptoms have improved.  Reviewed expectations re: course of current medical issues. Questions answered. Outlined signs and symptoms indicating need for more acute intervention. Patient verbalized understanding. After Visit Summary given.   SUBJECTIVE:  History from: patient. Timothy [name] is a 67 y.o. male who was recent tx for UTI here returns after experiencing cough and nasal congestion after taking Bactrim. Feels like he has a sinus infection. Reports very mild chest discomfort noted once 2 days ago that is better now. Here found to have low O2 sat. Denies current CP. Mild SOB. Denies n/v, diaphoresis, LE swelling, irregular heart beat,  near-syncope, orthopnea, palpitations, paroxysmal nocturnal dyspnea, and syncope.  Social History   Tobacco Use  Smoking Status Former   Packs/day: 1.00   Types: Cigarettes  Smokeless Tobacco Never   Social History   Substance and Sexual Activity  Alcohol Use No   Denies recreational drug use.   OBJECTIVE:  Vitals:   05/27/22 1521 05/27/22 1539 05/27/22 1654  BP: 137/87    Pulse: (!) 113 (!) 127 (!) 115  Resp: (!) 24 20   Temp: 99.2 F (37.3 C)    TempSrc: Oral    SpO2: 90% 96% (!) 88%    General appearance: alert, oriented, no acute distress; appears fatigued Eyes: PERRLA; EOMI; conjunctivae normal HENT: normocephalic; atraumatic Neck: supple with FROM Lungs: mildly labored respirations; speaks full sentences without difficulty; CTAB; initial tachypnea noted that improves with rest Heart: tachycardia noted3 Extremities: without edema; without calf swelling or tenderness; symmetrical without gross deformities Skin: warm and dry; without rash or lesions Neuro: normal gait Psychological: alert and cooperative; normal mood and affect  Labs: Results for orders placed or performed during the hospital encounter of 05/27/22  POC Urinalysis dipstick  Result Value Ref Range   Glucose, UA NEGATIVE NEGATIVE mg/dL   Bilirubin Urine NEGATIVE NEGATIVE   Ketones, ur NEGATIVE NEGATIVE mg/dL   Specific Gravity, Urine <=1.005 1.005 - 1.030   Hgb urine dipstick MODERATE (A) NEGATIVE   pH 5.5 5.0 - 8.0   Protein, ur NEGATIVE NEGATIVE mg/dL   Urobilinogen, UA 0.2 0.0 - 1.0 mg/dL   Nitrite  NEGATIVE NEGATIVE   Leukocytes,Ua NEGATIVE NEGATIVE  POC Influenza A & B Ag (Urgent Care)  Result Value Ref Range   Influenza A Ag Negative Negative   Influenza B Ag Negative Negative   Labs Reviewed  POCT URINALYSIS DIPSTICK, ED / UC - Abnormal; Notable for the following components:      Result Value   Hgb urine dipstick MODERATE (*)    All other components within normal limits  POC  INFLUENZA A AND B ANTIGEN (URGENT CARE ONLY)    Imaging: DG Chest 2 View  Result Date: 05/27/2022 CLINICAL DATA:  Hypoxia EXAM: CHEST - 2 VIEW COMPARISON:  Chest x-ray March 01, 2021 FINDINGS: The cardiomediastinal silhouette is unchanged in contour. No focal pulmonary opacity. Notably, the left costophrenic angle is excluded from the field of view on the AP view. Additionally, limited visualization of biapical lungs due to head positioning. No pleural effusion or pneumothorax. The visualized upper abdomen is unremarkable. No acute osseous abnormality. IMPRESSION: No acute cardiopulmonary abnormality. Electronically Signed   By: Beryle Flock M.D.   On: 05/27/2022 16:00     Allergies  Allergen Reactions   Penicillins Anaphylaxis    Has patient had a PCN reaction causing immediate rash, facial/tongue/throat swelling, SOB or lightheadedness with hypotension: Yes Has patient had a PCN reaction causing severe rash involving mucus membranes or skin necrosis: No Has patient had a PCN reaction that required hospitalization: Yes Has patient had a PCN reaction occurring within the last 10 years: No If all of the above answers are "NO", then may proceed with Cephalosporin use.     Past Medical History:  Diagnosis Date   Behavioral disorder    Cancer ([place])    Constipation    Gonorrhea    Social History   Socioeconomic History   Marital status: Single    Spouse name: Not on file   Number of children: Not on file   Years of education: Not on file   Highest education level: Not on file  Occupational History   Not on file  Tobacco Use   Smoking status: Former    Packs/day: 1.00    Types: Cigarettes   Smokeless tobacco: Never  Vaping Use   Vaping Use: Never used  Substance and Sexual Activity   Alcohol use: No   Drug use: No   Sexual activity: Not on file  Other Topics Concern   Not on file  Social History Narrative   ** Merged History Encounter **       Social Determinants  of Health   Financial Resource Strain: Not on file  Food Insecurity: Not on file  Transportation Needs: Not on file  Physical Activity: Not on file  Stress: Not on file  Social Connections: Not on file  Intimate Partner Violence: Not on file   Family History  Family history unknown: Yes   Past Surgical History:  Procedure Laterality Date   BACK SURGERY        Vanessa Kick, MD 05/27/22 1843

## 2022-05-27 NOTE — ED Notes (Signed)
Patient is being discharged from the Urgent Care and sent to the Emergency Department via POV . Per Vanessa Kick, MD, patient is in need of higher level of care due to low O2 and elevated heart rate. Patient is aware and verbalizes understanding of plan of care.  Vitals:   05/27/22 1539 05/27/22 1654  BP:    Pulse: (!) 127 (!) 115  Resp: 20   Temp:    SpO2: 96% (!) 88%

## 2022-05-27 NOTE — ED Triage Notes (Signed)
Pt states he is on meds for UTI. He states that when he takes the meds he gets chest pain but he doesn't currently have chest pain.  He said since he has been taking the meds he has developed rhinitis, sinusitis and he now needs something to that. He said he brought his cath supplies this time if we need another urine sample.    He stopped antibiotics yesterday he said he was advised to stop by someone at urgent care. He only took it once.

## 2022-05-27 NOTE — ED Notes (Signed)
Came in to check on pt his O2 dropped back down to 88 on room air, Pt placed back on Nasal canula at 6 liters O2 back up to 95

## 2022-05-27 NOTE — ED Triage Notes (Signed)
Pt was sent by UC for tachycardia and hypoxia. Pt c/o chest pain and head pain started yesterday.

## 2022-05-27 NOTE — ED Provider Triage Note (Signed)
Emergency Medicine Provider Triage Evaluation Note  Timothy [name] , a 67 y.o. male  was evaluated in triage.  Pt complains of shortness of breath.  Pt reports he has had a cough and congestion .  Review of Systems  Positive: fever Negative: Chest pain   Physical Exam  BP 135/80   Pulse (!) 121   Temp 99.7 F (37.6 C) (Oral)   Resp 18   Ht 6' (1.829 m)   Wt 124.7 kg   SpO2 93%   BMI 37.29 kg/m  Gen:   Awake, no distress   Resp:  Normal effort  MSK:   Moves extremities without difficulty  Other:    Medical Decision Making  Medically screening exam initiated at 7:14 PM.  Appropriate orders placed.  [name] was informed that the remainder of the evaluation will be completed by another provider, this initial triage assessment does not replace that evaluation, and the importance of remaining in the ED until their evaluation is complete.     Fransico Meadow, [place] 05/27/22 [id number]

## 2022-05-28 ENCOUNTER — Emergency Department (HOSPITAL_COMMUNITY): Payer: No Typology Code available for payment source

## 2022-05-28 LAB — TROPONIN I (HIGH SENSITIVITY): Troponin I (High Sensitivity): 25 ng/L — ABNORMAL HIGH (ref <18)

## 2022-05-28 MED ORDER — LEVOFLOXACIN 500 MG PO TABS: 750.0000 mg | ORAL_TABLET | Freq: Every day | ORAL | 0 refills | Status: DC

## 2022-05-28 MED ORDER — IOHEXOL 350 MG/ML SOLN
75.0000 mL | Freq: Once | INTRAVENOUS | Status: AC | PRN
  Administered 2022-05-28: 75 mL via INTRAVENOUS

## 2022-05-28 MED ORDER — ACETAMINOPHEN 500 MG PO TABS
1000.0000 mg | ORAL_TABLET | ORAL | Status: AC
  Administered 2022-05-28: 1000 mg via ORAL
  Filled 2022-05-28: qty 2

## 2022-05-28 NOTE — ED Provider Notes (Signed)
I provided a substantive portion of the care of this patient.  I personally performed the entirety of the medical decision making for this encounter.  EKG Interpretation  Date/Time:  Tuesday May 27 2022 18:04:40 EST Ventricular Rate:  122 PR Interval:  164 QRS Duration: 122 QT Interval:  326 QTC Calculation: 464 R Axis:   174 Text Interpretation: Sinus tachycardia Right bundle branch block Possible Lateral infarct , age undetermined Cannot rule out Inferior infarct , age undetermined Abnormal ECG When compared with ECG of 15-May-2022 21:40, PREVIOUS ECG IS PRESENT rbbb is new when compared to prior Confirmed by Lacretia Leigh ([id number]) on 05/28/2022 7:28:90 AM   67 year old male presents with cough and congestion times several days.  Also has increased dyspnea.  Patient is EKG per interpretation shows sinus tachycardia.  Patient was febrile.  Treated with Tylenol and patient's heart rate did improve.  Troponins were flat with a slight bump likely from some demand ischemia.  COVID and flu test negative.  Due to concern for possible deep pulmonary infection, will have chest CT to rule out that versus pulmonary embolus.   Lacretia Leigh, MD 05/28/22 [phone]

## 2022-05-28 NOTE — ED Notes (Signed)
RN assisted pt with putting on clothes. Rn reviewed d/c paperwork and all pt questions answered.   Pt given walker.

## 2022-05-28 NOTE — Discharge Instructions (Addendum)
Doing an appointment with her primary care provider in the next 1 to 2 weeks to have a repeat CT scan of your kidneys as the CT scan today could not visualize the kidneys fully. Also, please have your thyroid further evaluated as well due to the nodules found on CT. when to be picked up at your pharmacy to help treat your pneumonia and UTI.  Please stop taking your Bactrim.

## 2022-05-28 NOTE — ED Provider Notes (Signed)
67 year old male significant history of prostate cancer sent here by urgent care center with concerns of tachycardia and hypoxia.  Patient endorsed headache chest pain that started 2 days ago.  He was noted to be tachycardic with heart rate in the 120s and was hypoxic with an O2 sats of 88% on room air initially.  Workup remarkable for elevated troponin of 21 then 25 respectively.  His viral respiratory panel came back negative for RSV, COVID, and flu.  Will order chest CT angiogram to rule out PE as patient is at high risk due to his underlying history of cancer.  Please refer to note from Lurena Nida, PA-C who is the primary provider during this visit.  BP (!) 128/90 (BP Location: Right Wrist)   Pulse 80   Temp 98.1 F (36.7 C) (Oral)   Resp 16   Ht 6' (1.829 m)   Wt 124.7 kg   SpO2 96%   BMI 37.29 kg/m   Results for orders placed or performed during the hospital encounter of 05/27/22  Resp panel by RT-PCR (RSV, Flu A&B, Covid) Anterior Nasal Swab   Specimen: Anterior Nasal Swab  Result Value Ref Range   SARS Coronavirus 2 by RT PCR NEGATIVE NEGATIVE   Influenza A by PCR NEGATIVE NEGATIVE   Influenza B by PCR NEGATIVE NEGATIVE   Resp Syncytial Virus by PCR NEGATIVE NEGATIVE  Basic metabolic panel  Result Value Ref Range   Sodium 134 (L) 135 - 145 mmol/L   Potassium 3.7 3.5 - 5.1 mmol/L   Chloride 103 98 - 111 mmol/L   CO2 23 22 - 32 mmol/L   Glucose, Bld 84 70 - 99 mg/dL   BUN <5 (L) 8 - 23 mg/dL   Creatinine, Ser 0.96 0.61 - 1.24 mg/dL   Calcium 9.0 8.9 - 10.3 mg/dL   GFR, Estimated >60 >60 mL/min   Anion gap 8 5 - 15  CBC  Result Value Ref Range   WBC 5.6 4.0 - 10.5 K/uL   RBC 4.62 4.22 - 5.81 MIL/uL   Hemoglobin 13.9 13.0 - 17.0 g/dL   HCT 41.1 39.0 - 52.0 %   MCV 89.0 80.0 - 100.0 fL   MCH 30.1 26.0 - 34.0 pg   MCHC 33.8 30.0 - 36.0 g/dL   RDW 13.4 11.5 - 15.5 %   Platelets 210 150 - 400 K/uL   nRBC 0.0 0.0 - 0.2 %  Troponin I (High Sensitivity)  Result Value  Ref Range   Troponin I (High Sensitivity) 21 (H) <18 ng/L  Troponin I (High Sensitivity)  Result Value Ref Range   Troponin I (High Sensitivity) 25 (H) <18 ng/L   CT Angio Chest PE W/Cm &/Or Wo Cm  Result Date: 05/28/2022 CLINICAL DATA:  Hypoxia EXAM: CT ANGIOGRAPHY CHEST WITH CONTRAST TECHNIQUE: Multidetector CT imaging of the chest was performed using the standard protocol during bolus administration of intravenous contrast. Multiplanar CT image reconstructions and MIPs were obtained to evaluate the vascular anatomy. RADIATION DOSE REDUCTION: This exam was performed according to the departmental dose-optimization program which includes automated exposure control, adjustment of the mA and/or kV according to patient size and/or use of iterative reconstruction technique. CONTRAST:  60m OMNIPAQUE IOHEXOL 350 MG/ML SOLN COMPARISON:  Previous studies including the chest radiograph done on 05/27/2022 FINDINGS: Cardiovascular: There are no intraluminal filling defects in pulmonary artery branches. There is homogeneous enhancement in thoracic aorta. There is prominent pericardial recess adjacent to the aorta. Minimal pericardial effusion is seen. Mediastinum/Nodes: No  significant lymphadenopathy is seen. Thyroid is enlarged in size. There is 2.5 cm low-density nodule in the right lobe. There is 2.1 cm low-density nodule in the left lobe. Lungs/Pleura: There is linear patchy infiltrate in left upper lobe in parahilar region. Faint patchy ground-glass densities are seen in upper and mid lung fields. There are small linear densities in both lower lobes. There is peribronchial thickening. There is no pleural effusion or pneumothorax. Upper Abdomen: Surgical clips are seen in gallbladder fossa. Small hiatal hernia is seen. There is 3.1 cm nodular density in the anterior margin of upper pole of left kidney. In the previous CT abdomen done on 02/26/2016, possible lobulation was seen in the anterolateral aspect of left  kidney. The focal bulge appears more prominent. Musculoskeletal: No acute findings are seen. Review of the MIP images confirms the above findings. IMPRESSION: There is no evidence of pulmonary artery embolism. There is no evidence of thoracic aortic dissection. Linear patchy infiltrates in left upper lobe suggest subsegmental atelectasis/pneumonia. Part of this finding may suggest underlying scarring. There are small linear densities in both lower lobes suggesting scarring or subsegmental atelectasis. Scattered faint ground-glass densities are seen in both lungs suggesting scarring or atypical pneumonia. Peribronchial thickening suggests bronchitis. There is no pleural effusion. There is 3.1 cm exophytic structure in the anterior margin of upper pole of left kidney. Density measurements suggest solid structure. Possibility of malignant neoplasm in the left kidney is not excluded. Another possibility would be variation such as fetal lobulation. Follow-up contrast enhanced CT of abdomen and pelvis should be considered. There are low-density nodules in both lobes of thyroid measuring up to 2.5 cm in diameter. Follow-up thyroid sonogram in nonemergent setting should be considered for further evaluation. Imaging findings including the need to evaluate the thyroid nodules and possible left renal lesion were relayed to Dr. Zenia Resides by telephone call. Electronically Signed   By: Elmer Picker M.D.   On: 05/28/2022 12:24   DG Chest 2 View  Result Date: 05/27/2022 CLINICAL DATA:  Hypoxia EXAM: CHEST - 2 VIEW COMPARISON:  Chest x-ray March 01, 2021 FINDINGS: The cardiomediastinal silhouette is unchanged in contour. No focal pulmonary opacity. Notably, the left costophrenic angle is excluded from the field of view on the AP view. Additionally, limited visualization of biapical lungs due to head positioning. No pleural effusion or pneumothorax. The visualized upper abdomen is unremarkable. No acute osseous abnormality.  IMPRESSION: No acute cardiopulmonary abnormality. Electronically Signed   By: Beryle Flock M.D.   On: 05/27/2022 16:00      Domenic Moras, PA-C 05/29/22 1520    Lacretia Leigh, MD 05/30/22 1438

## 2022-05-28 NOTE — ED Provider Notes (Signed)
[facility] Provider Note   CSN: [id number] Arrival date & time: 05/27/22  1800     History  Chief Complaint  Patient presents with   Tachycardia    Timothy [name] is a 67 y.o. male history of prostate cancer, presented for 1 day history of tachycardia and hypoxia.  Patient was at urgent care yesterday and was told to go to ER.  Recently treated with with Bactrim for a UTI.  States he has had "chest cold."  Patient states symptoms began after he started taking Bactrim 2 days ago.  States chest pain is 3/10 dull constant but exacerbated when coughing. Patient endorsed shortness of breath with productive cough (yellow mucus) but denied hemoptysis. Patient endorsed feeling congested in his sinuses.  Patient endorsed Tylenol has helped alleviate symptoms.  Patient stated he was on radiation for his prostate cancer but not chemotherapy.  Patient is most worried about his PSA levels.  Patient denied diarrhea, hematochezia, abdominal pain, back pain, nausea, vomiting, diarrhea, syncope.  Home Medications Prior to Admission medications   Medication Sig Start Date End Date Taking? Authorizing Provider  levofloxacin (LEVAQUIN) 500 MG tablet Take 1.5 tablets (750 mg total) by mouth daily. 05/28/22  Yes Jhovani Griswold, Florene Route, PA-C  bisacodyl (DULCOLAX) 5 MG EC tablet Take 5 mg by mouth daily as needed for moderate constipation.    [provider]  Calcium Carb-Cholecalciferol (CALCIUM-VITAMIN D) 500-400 MG-UNIT TABS Take 1 tablet by mouth daily.    [provider]  cetirizine (ZYRTEC ALLERGY) 10 MG tablet Take 1 tablet (10 mg total) by mouth daily. 07/19/20   Hazel Sams, PA-C  cyclobenzaprine (FLEXERIL) 10 MG tablet Take 1 tablet (10 mg total) by mouth 2 (two) times daily as needed for muscle spasms. 02/27/21   Gareth Morgan, MD  diclofenac (VOLTAREN) 50 MG EC tablet Take 1 tablet (50 mg total) by mouth 2 (two) times daily as needed (pain).  12/17/21   Barrett Henle, MD  diclofenac Sodium (VOLTAREN) 1 % GEL Apply 2 g topically 4 (four) times daily. 11/20/20   Hans Eden, NP  diphenhydrAMINE (BENADRYL) 25 mg capsule Take 25 mg by mouth at bedtime as needed for itching or sleep.    [provider]  gabapentin (NEURONTIN) 300 MG capsule Take 1 capsule (300 mg total) by mouth 3 (three) times daily. 06/18/20   Jaynee Eagles, PA-C  haloperidol (HALDOL) 5 MG tablet Take 5 mg by mouth at bedtime.    [provider]  haloperidol decanoate (HALDOL DECANOATE) 50 MG/ML injection Inject 50 mg into the muscle every 28 (twenty-eight) days.    [provider]  hydroxypropyl methylcellulose / hypromellose (ISOPTO TEARS / GONIOVISC) 2.5 % ophthalmic solution Place 1 drop into both eyes 3 (three) times daily as needed for dry eyes. 08/17/21   Regan Lemming, MD  lactulose (CHRONULAC) 10 GM/15ML solution Take 20 g by mouth 3 (three) times a week.    [provider]  mupirocin cream (BACTROBAN) 2 % Apply 1 Application topically 2 (two) times daily. 11/04/21   Nyoka Lint, PA-C  omeprazole (PRILOSEC) 20 MG capsule Take 40 mg by mouth 2 (two) times daily before a meal.    [provider]  oxybutynin (DITROPAN) 5 MG tablet Take 5 mg by mouth 3 (three) times daily as needed for bladder spasms.    [provider]  oxyCODONE (ROXICODONE) 15 MG immediate release tablet Take 15 mg by mouth 3 (three) times daily  as needed. 12/13/21   [provider]  phenazopyridine (PYRIDIUM) 200 MG tablet Take 1 tablet (200 mg total) by mouth 3 (three) times daily. 06/21/53   Defelice, Jeanett Schlein, NP  polyethylene glycol (MIRALAX / GLYCOLAX) packet Take 17 g by mouth 3 (three) times a week.    [provider]  sertraline (ZOLOFT) 50 MG tablet Take 50 mg by mouth daily.    [provider]  sulfamethoxazole-trimethoprim (BACTRIM DS) 800-160 MG tablet Take 1 tablet by mouth 2 (two) times daily for 3 days.  05/26/22 05/29/22  LampteyMyrene Galas, MD  VIAGRA 100 MG tablet Take 100 mg by mouth as needed for erectile dysfunction.  01/24/16   [provider]      Allergies    Penicillins    Review of Systems   Review of Systems See HPI Physical Exam Updated Vital Signs BP 124/81   Pulse 79   Temp 98.1 F (36.7 C)   Resp 18   Ht 6' (1.829 m)   Wt 124.7 kg   SpO2 96%   BMI 37.29 kg/m  Physical Exam Vitals and nursing note reviewed.  Constitutional:      General: He is not in acute distress.    Appearance: He is well-developed.  HENT:     Head: Normocephalic and atraumatic.     Comments: No sinus pain with palpation Eyes:     Extraocular Movements: Extraocular movements intact.     Conjunctiva/sclera: Conjunctivae normal.     Pupils: Pupils are equal, round, and reactive to light.  Cardiovascular:     Rate and Rhythm: Normal rate and regular rhythm.     Heart sounds: No murmur heard. Pulmonary:     Effort: No respiratory distress.     Comments: Patient had nonproductive cough during encounter No wheezing, stridor, crackles noted on exam Abdominal:     General: Abdomen is flat.     Palpations: Abdomen is soft.     Tenderness: There is no abdominal tenderness. There is no guarding.  Musculoskeletal:        General: No swelling.     Cervical back: Neck supple.  Skin:    General: Skin is warm and dry.     Capillary Refill: Capillary refill takes less than 2 seconds.  Neurological:     General: No focal deficit present.     Mental Status: He is alert. Mental status is at baseline.  Psychiatric:        Mood and Affect: Mood normal.     ED Results / Procedures / Treatments   Labs (all labs ordered are listed, but only abnormal results are displayed) Labs Reviewed  BASIC METABOLIC PANEL - Abnormal; Notable for the following components:      Result Value   Sodium 134 (*)    BUN <5 (*)    All other components within normal limits  TROPONIN I (HIGH SENSITIVITY) -  Abnormal; Notable for the following components:   Troponin I (High Sensitivity) 21 (*)    All other components within normal limits  TROPONIN I (HIGH SENSITIVITY) - Abnormal; Notable for the following components:   Troponin I (High Sensitivity) 25 (*)    All other components within normal limits  RESP PANEL BY RT-PCR (RSV, FLU A&B, COVID)  [id number]  CBC    EKG EKG Interpretation  Date/Time:  Tuesday May 27 2022 18:04:40 EST Ventricular Rate:  122 PR Interval:  164 QRS Duration: 122 QT Interval:  326 QTC Calculation: 464 R  Axis:   174 Text Interpretation: Sinus tachycardia Right bundle branch block Possible Lateral infarct , age undetermined Cannot rule out Inferior infarct , age undetermined Abnormal ECG When compared with ECG of 15-May-2022 21:40, PREVIOUS ECG IS PRESENT rbbb is new when compared to prior Confirmed by Lacretia Leigh ([id number]) on 05/28/2022 7:37:56 AM  Radiology CT Angio Chest PE W/Cm &/Or Wo Cm  Result Date: 05/28/2022 CLINICAL DATA:  Hypoxia EXAM: CT ANGIOGRAPHY CHEST WITH CONTRAST TECHNIQUE: Multidetector CT imaging of the chest was performed using the standard protocol during bolus administration of intravenous contrast. Multiplanar CT image reconstructions and MIPs were obtained to evaluate the vascular anatomy. RADIATION DOSE REDUCTION: This exam was performed according to the departmental dose-optimization program which includes automated exposure control, adjustment of the mA and/or kV according to patient size and/or use of iterative reconstruction technique. CONTRAST:  95m OMNIPAQUE IOHEXOL 350 MG/ML SOLN COMPARISON:  Previous studies including the chest radiograph done on 05/27/2022 FINDINGS: Cardiovascular: There are no intraluminal filling defects in pulmonary artery branches. There is homogeneous enhancement in thoracic aorta. There is prominent pericardial recess adjacent to the aorta. Minimal pericardial effusion is seen. Mediastinum/Nodes: No significant  lymphadenopathy is seen. Thyroid is enlarged in size. There is 2.5 cm low-density nodule in the right lobe. There is 2.1 cm low-density nodule in the left lobe. Lungs/Pleura: There is linear patchy infiltrate in left upper lobe in parahilar region. Faint patchy ground-glass densities are seen in upper and mid lung fields. There are small linear densities in both lower lobes. There is peribronchial thickening. There is no pleural effusion or pneumothorax. Upper Abdomen: Surgical clips are seen in gallbladder fossa. Small hiatal hernia is seen. There is 3.1 cm nodular density in the anterior margin of upper pole of left kidney. In the previous CT abdomen done on 02/26/2016, possible lobulation was seen in the anterolateral aspect of left kidney. The focal bulge appears more prominent. Musculoskeletal: No acute findings are seen. Review of the MIP images confirms the above findings. IMPRESSION: There is no evidence of pulmonary artery embolism. There is no evidence of thoracic aortic dissection. Linear patchy infiltrates in left upper lobe suggest subsegmental atelectasis/pneumonia. Part of this finding may suggest underlying scarring. There are small linear densities in both lower lobes suggesting scarring or subsegmental atelectasis. Scattered faint ground-glass densities are seen in both lungs suggesting scarring or atypical pneumonia. Peribronchial thickening suggests bronchitis. There is no pleural effusion. There is 3.1 cm exophytic structure in the anterior margin of upper pole of left kidney. Density measurements suggest solid structure. Possibility of malignant neoplasm in the left kidney is not excluded. Another possibility would be variation such as fetal lobulation. Follow-up contrast enhanced CT of abdomen and pelvis should be considered. There are low-density nodules in both lobes of thyroid measuring up to 2.5 cm in diameter. Follow-up thyroid sonogram in nonemergent setting should be considered for  further evaluation. Imaging findings including the need to evaluate the thyroid nodules and possible left renal lesion were relayed to Dr. AZenia Residesby telephone call. Electronically Signed   By: PElmer PickerM.D.   On: 05/28/2022 12:24   DG Chest 2 View  Result Date: 05/27/2022 CLINICAL DATA:  Hypoxia EXAM: CHEST - 2 VIEW COMPARISON:  Chest x-ray March 01, 2021 FINDINGS: The cardiomediastinal silhouette is unchanged in contour. No focal pulmonary opacity. Notably, the left costophrenic angle is excluded from the field of view on the AP view. Additionally, limited visualization of biapical lungs due to head positioning. No pleural  effusion or pneumothorax. The visualized upper abdomen is unremarkable. No acute osseous abnormality. IMPRESSION: No acute cardiopulmonary abnormality. Electronically Signed   By: Beryle Flock M.D.   On: 05/27/2022 16:00    Procedures Procedures    Medications Ordered in ED Medications  acetaminophen (TYLENOL) tablet 1,000 mg (1,000 mg Oral Given 05/28/22 0515)  iohexol (OMNIPAQUE) 350 MG/ML injection 75 mL (75 mLs Intravenous Contrast Given 05/28/22 1200)    ED Course/ Medical Decision Making/ A&P                           Medical Decision Making  Timothy [name] 67 y.o. presented today for cardiac hypoxia. Working DDx that I considered at this time includes, but not limited to, viral illness, pneumonia, UTI, PE, ACS, metastasis of prostate cancer.  Review of prior external notes: 05/24/22 ED visit  Unique Tests and My Interpretation: Troponin: 25 BMP: Unremarkable Respiratory panel: Unremarkable CBC: Unremarkable EKG: Bundle branch block with tachycardia; no signs of ischemia noted CXR: No acute cardiopulmonary changes; I agree with radiologist read CT chest with contrast: Infiltrates in the left upper lobe suggestive of pneumonia; 3.1 cm structure on upper pole of L kidney; 2.5 cm low-density thyroid nodules; I agree with radiologist  read  Discussion with Independent Historian: None  Discussion of Management of Tests: None  Risk:    Medium:  - prescription drug management  Risk Stratification Score:  Well's score PE: 0  Staffed with Lacretia Leigh, MD  R/o DDx: ACS: Negative troponin along with unremarkable EKG makes this unlikely PE: Wells score of 0 makes this unlikely at this time; CTA was negative for PE Viral illness: resp panel was negative  Plan: Patient presented tachycardia and hypoxia along with a fever.  Patient received Tylenol and temperature decreased.  His vitals during encounter were significant for tachypnea of 21 however O2 saturation on room air was 96%.  X-ray was negative for any signs of pneumonia however with the patient's high risk medical history we decided was best to proceed with a CT scan with contrast to assess for any pneumonia that might have been missed on a chest x-ray.  CT came back suggestive of pneumonia with patchy infiltrates in the left lung.  CT also had incidental findings for a 1 cm structure on the upper pole of the left kidney that will need to be reevaluated in 1 to 2 weeks with primary care with repeat CT scan.  Patient also had a 2.5 cm nodule on his thyroid that will need further evaluation via ultrasound as well from his PCP.  Patient will also be given Levaquin to both manage his pneumonia and UTI patient has a penicillin allergy. Patient's vitals are stable for d/c as he no longer hypoxia or tachycardic. patient verbalized agreement to this plan.        Final Clinical Impression(s) / ED Diagnoses Final diagnoses:  Pneumonia of left upper lobe due to infectious organism  Acute cystitis without hematuria    Rx / DC Orders ED Discharge Orders          Ordered    levofloxacin (LEVAQUIN) 500 MG tablet  Daily        05/28/22 1309              Elvina Sidle 05/28/22 1328    Lacretia Leigh, MD 05/30/22 1438

## 2022-09-23 ENCOUNTER — Emergency Department (HOSPITAL_COMMUNITY): Payer: No Typology Code available for payment source

## 2022-09-23 ENCOUNTER — Emergency Department (HOSPITAL_COMMUNITY)
Admission: EM | Admit: 2022-09-23 | Discharge: 2022-09-23 | Disposition: A | Discharge status: Home / Self Care | Payer: No Typology Code available for payment source | Attending: Emergency Medicine | Admitting: Emergency Medicine

## 2022-09-23 ENCOUNTER — Emergency Department (HOSPITAL_BASED_OUTPATIENT_CLINIC_OR_DEPARTMENT_OTHER): Payer: No Typology Code available for payment source

## 2022-09-23 ENCOUNTER — Encounter (HOSPITAL_COMMUNITY): Payer: Self-pay

## 2022-09-23 DIAGNOSIS — Z8546 Personal history of malignant neoplasm of prostate: Secondary | ICD-10-CM | POA: Insufficient documentation

## 2022-09-23 DIAGNOSIS — M79606 Pain in leg, unspecified: Secondary | ICD-10-CM | POA: Diagnosis not present

## 2022-09-23 DIAGNOSIS — R600 Localized edema: Secondary | ICD-10-CM

## 2022-09-23 DIAGNOSIS — M48061 Spinal stenosis, lumbar region without neurogenic claudication: Secondary | ICD-10-CM

## 2022-09-23 DIAGNOSIS — M549 Dorsalgia, unspecified: Secondary | ICD-10-CM | POA: Diagnosis present

## 2022-09-23 LAB — CBC WITH DIFFERENTIAL/PLATELET
Abs Immature Granulocytes: 0.02 10*3/uL (ref 0.00–0.07)
Basophils Absolute: 0.1 10*3/uL (ref 0.0–0.1)
Basophils Relative: 1 %
Eosinophils Absolute: 0.4 10*3/uL (ref 0.0–0.5)
Eosinophils Relative: 8 %
HCT: 43.9 % (ref 39.0–52.0)
Hemoglobin: 14.4 g/dL (ref 13.0–17.0)
Immature Granulocytes: 0 %
Lymphocytes Relative: 26 %
Lymphs Abs: 1.3 10*3/uL (ref 0.7–4.0)
MCH: 29.6 pg (ref 26.0–34.0)
MCHC: 32.8 g/dL (ref 30.0–36.0)
MCV: 90.3 fL (ref 80.0–100.0)
Monocytes Absolute: 0.4 10*3/uL (ref 0.1–1.0)
Monocytes Relative: 9 %
Neutro Abs: 2.8 10*3/uL (ref 1.7–7.7)
Neutrophils Relative %: 56 %
Platelets: 220 10*3/uL (ref 150–400)
RBC: 4.86 MIL/uL (ref 4.22–5.81)
RDW: 13.6 % (ref 11.5–15.5)
WBC: 5 10*3/uL (ref 4.0–10.5)
nRBC: 0 % (ref 0.0–0.2)

## 2022-09-23 LAB — BASIC METABOLIC PANEL
Anion gap: 8 (ref 5–15)
BUN: <5 mg/dL — ABNORMAL LOW (ref 8–23)
CO2: 27 mmol/L (ref 22–32)
Calcium: 8.8 mg/dL — ABNORMAL LOW (ref 8.9–10.3)
Chloride: 103 mmol/L (ref 98–111)
Creatinine, Ser: 0.99 mg/dL (ref 0.61–1.24)
GFR, Estimated: >60 mL/min (ref >60)
Glucose, Bld: 96 mg/dL (ref 70–99)
Potassium: 3.8 mmol/L (ref 3.5–5.1)
Sodium: 138 mmol/L (ref 135–145)

## 2022-09-23 MED ORDER — HYDROMORPHONE HCL 1 MG/ML IJ SOLN
1.0000 mg | Freq: Once | INTRAMUSCULAR | Status: AC
  Administered 2022-09-23: 1 mg via INTRAVENOUS
  Filled 2022-09-23 (×2): qty 1

## 2022-09-23 NOTE — Progress Notes (Signed)
Lower extremity venous bilateral study completed.  Preliminary results relayed to Charm Barges, MD.   See CV Proc for preliminary results report.   Jean Rosenthal, RDMS, RVT

## 2022-09-23 NOTE — Discharge Instructions (Signed)
You were seen in the emergency department for worsening of your chronic back pain and swelling of your feet.  You had lab work that was unremarkable and ultrasound of your legs did not show any blood clot.  Your MRI showed continued spinal stenosis and degenerative changes of your spine.  Please contact your spine team for close follow-up.  Continue your regular medications.  Return to the emergency department if any worsening or concerning symptoms.

## 2022-09-23 NOTE — ED Triage Notes (Signed)
Pt BIB EMS from home with c/o back pain and bilateral pedal edema. Pt reports swelling has been ongoing for 2 days but became painful this morning. No hx of CHF, has chronic back pain.   BP 150/88 HR 63 CBG 114

## 2022-09-23 NOTE — ED Notes (Signed)
Pt received to ED room 40. Report received from [place], [place]. Pending discharge home via [facility].

## 2022-09-23 NOTE — ED Provider Notes (Signed)
[place] EMERGENCY DEPARTMENT AT [facility] Provider Note   CSN: [id number] Arrival date & time: 09/23/22  0831     History {Add pertinent medical, surgical, social history, OB history to HPI:1} No chief complaint on file.   Timothy [name] is a 67 y.o. male.  He has a history of prostate cancer cancer in his spine led to surgery and some decrease use of his lower extremities, neurogenic bladder.  Receives most of his care at the [place] and ambulates with a walker.  He is on chronic pain medicine.  He said he has increased pain in his neck back legs and feet over the past few weeks.  No trauma.  He usually responds to spinal injections but tells me they could not do a spinal injection during his last visit for unclear reasons.  He sees his pain specialist on Thursday.  He is also complaining of some swelling in his feet.  No fevers or chills nausea vomiting.  He has general weakness and numbness in his lower extremities that have not changed.  He uses a condom cath secondary to incontinence.  No urinary symptoms.  The history is provided by the patient.  Back Pain Location:  Generalized Quality:  Aching Pain severity:  Severe Pain is:  Same all the time Onset quality:  Gradual Timing:  Constant Progression:  Unchanged Chronicity:  Chronic Worsened by:  Ambulation and bending Ineffective treatments:  Bed rest and narcotics Associated symptoms: bladder incontinence, leg pain, numbness and weakness   Associated symptoms: no abdominal pain, no chest pain, no dysuria and no fever   Risk factors: hx of cancer        Home Medications Prior to Admission medications   Medication Sig Start Date End Date Taking? Authorizing Provider  bisacodyl (DULCOLAX) 5 MG EC tablet Take 5 mg by mouth daily as needed for moderate constipation.    [provider]  Calcium Carb-Cholecalciferol (CALCIUM-VITAMIN D) 500-400 MG-UNIT TABS Take 1 tablet by mouth daily.    [provider]  cetirizine (ZYRTEC ALLERGY) 10 MG tablet Take 1 tablet (10 mg total) by mouth daily. 07/19/20   Rhys Martini, PA-C  cyclobenzaprine (FLEXERIL) 10 MG tablet Take 1 tablet (10 mg total) by mouth 2 (two) times daily as needed for muscle spasms. 02/27/21   Alvira Monday, MD  diclofenac (VOLTAREN) 50 MG EC tablet Take 1 tablet (50 mg total) by mouth 2 (two) times daily as needed (pain). 12/17/21   Zenia Resides, MD  diclofenac Sodium (VOLTAREN) 1 % GEL Apply 2 g topically 4 (four) times daily. 11/20/20   Valinda Hoar, NP  diphenhydrAMINE (BENADRYL) 25 mg capsule Take 25 mg by mouth at bedtime as needed for itching or sleep.    [provider]  gabapentin (NEURONTIN) 300 MG capsule Take 1 capsule (300 mg total) by mouth 3 (three) times daily. 06/18/20   Wallis Bamberg, PA-C  haloperidol (HALDOL) 5 MG tablet Take 5 mg by mouth at bedtime.    [provider]  haloperidol decanoate (HALDOL DECANOATE) 50 MG/ML injection Inject 50 mg into the muscle every 28 (twenty-eight) days.    [provider]  hydroxypropyl methylcellulose / hypromellose (ISOPTO TEARS / GONIOVISC) 2.5 % ophthalmic solution Place 1 drop into both eyes 3 (three) times daily as needed for dry eyes. 08/17/21   Ernie Avena, MD  lactulose (CHRONULAC) 10 GM/15ML solution Take 20 g by mouth 3 (three) times a week.    [provider]  levofloxacin (LEVAQUIN) 500 MG tablet Take 1.5 tablets (750 mg total) by mouth daily. 05/28/22   Netta Corrigan, PA-C  mupirocin cream (BACTROBAN) 2 % Apply 1 Application topically 2 (two) times daily. 11/04/21   Ellsworth Lennox, PA-C  omeprazole (PRILOSEC) 20 MG capsule Take 40 mg by mouth 2 (two) times daily before a meal.    [provider]  oxybutynin (DITROPAN) 5 MG tablet Take 5 mg by mouth 3 (three) times daily as needed for bladder spasms.    [provider]  oxyCODONE (ROXICODONE) 15 MG immediate release tablet Take 15 mg by mouth 3 (three) times  daily as needed. 12/13/21   [provider]  phenazopyridine (PYRIDIUM) 200 MG tablet Take 1 tablet (200 mg total) by mouth 3 (three) times daily. 05/24/22   Defelice, Para March, NP  polyethylene glycol (MIRALAX / GLYCOLAX) packet Take 17 g by mouth 3 (three) times a week.    [provider]  sertraline (ZOLOFT) 50 MG tablet Take 50 mg by mouth daily.    [provider]  VIAGRA 100 MG tablet Take 100 mg by mouth as needed for erectile dysfunction.  01/24/16   [provider]      Allergies    Penicillins    Review of Systems   Review of Systems  Constitutional:  Negative for fever.  Respiratory:  Negative for shortness of breath.   Cardiovascular:  Negative for chest pain.  Gastrointestinal:  Negative for abdominal pain.  Genitourinary:  Positive for bladder incontinence. Negative for dysuria.  Musculoskeletal:  Positive for back pain.  Neurological:  Positive for weakness and numbness.    Physical Exam Updated Vital Signs BP (!) 142/85   Pulse 73   Temp 97.6 F (36.4 C) (Oral)   Resp 18   SpO2 100%  Physical Exam Vitals and nursing note reviewed.  Constitutional:      General: He is not in acute distress.    Appearance: Normal appearance. He is well-developed. He is obese.  HENT:     Head: Normocephalic and atraumatic.  Eyes:     Conjunctiva/sclera: Conjunctivae normal.  Cardiovascular:     Rate and Rhythm: Normal rate and regular rhythm.     Heart sounds: No murmur heard. Pulmonary:     Effort: Pulmonary effort is normal. No respiratory distress.     Breath sounds: Normal breath sounds.  Abdominal:     Palpations: Abdomen is soft.     Tenderness: There is no abdominal tenderness.  Musculoskeletal:        General: No swelling.     Cervical back: Neck supple.  Skin:    General: Skin is warm and dry.     Capillary Refill: Capillary refill takes less than 2 seconds.  Neurological:     General: No focal deficit present.     Mental  Status: He is alert. Mental status is at baseline.     Comments: Patient is generally weak in his lower extremities.  He is able to move the although it causes pain.  His distal pulses are intact.     ED Results / Procedures / Treatments   Labs (all labs ordered are listed, but only abnormal results are displayed) Labs Reviewed - No data to display  EKG None  Radiology No results found.  Procedures Procedures  {Document cardiac monitor, telemetry assessment procedure when appropriate:1}  Medications Ordered in ED Medications  HYDROmorphone (DILAUDID) injection 1 mg (has no administration in time range)  ED Course/ Medical Decision Making/ A&P   {   Click here for ABCD2, HEART and other calculatorsREFRESH Note before signing :1}                          Medical Decision Making Amount and/or Complexity of Data Reviewed Labs: ordered.  Risk Prescription drug management.   This patient complains of ***; this involves an extensive number of treatment Options and is a complaint that carries with it a high risk of complications and morbidity. The differential includes ***  I ordered, reviewed and interpreted labs, which included *** I ordered medication *** and reviewed PMP when indicated. I ordered imaging studies which included *** and I independently    visualized and interpreted imaging which showed *** Additional history obtained from *** Previous records obtained and reviewed *** I consulted *** and discussed lab and imaging findings and discussed disposition.  Cardiac monitoring reviewed, *** Social determinants considered, *** Critical Interventions: ***  After the interventions stated above, I reevaluated the patient and found *** Admission and further testing considered, ***   {Document critical care time when appropriate:1} {Document review of labs and clinical decision tools ie heart score, Chads2Vasc2 etc:1}  {Document your independent review of  radiology images, and any outside records:1} {Document your discussion with family members, caretakers, and with consultants:1} {Document social determinants of health affecting pt's care:1} {Document your decision making why or why not admission, treatments were needed:1} Final Clinical Impression(s) / ED Diagnoses Final diagnoses:  None    Rx / DC Orders ED Discharge Orders     None

## 2022-09-23 NOTE — ED Notes (Signed)
PTAR called pt to aof

## 2022-10-14 ENCOUNTER — Ambulatory Visit (HOSPITAL_COMMUNITY): Payer: Non-veteran care

## 2022-12-02 ENCOUNTER — Encounter (HOSPITAL_COMMUNITY): Payer: Self-pay

## 2022-12-02 ENCOUNTER — Emergency Department (HOSPITAL_COMMUNITY)
Admission: EM | Admit: 2022-12-02 | Discharge: 2022-12-03 | Disposition: A | Discharge status: Home / Self Care | Payer: No Typology Code available for payment source | Attending: Emergency Medicine | Admitting: Emergency Medicine

## 2022-12-02 ENCOUNTER — Emergency Department (HOSPITAL_COMMUNITY): Payer: No Typology Code available for payment source

## 2022-12-02 DIAGNOSIS — R079 Chest pain, unspecified: Secondary | ICD-10-CM | POA: Diagnosis present

## 2022-12-02 LAB — ETHANOL: Alcohol, Ethyl (B): <10 mg/dL (ref <10)

## 2022-12-02 LAB — COMPREHENSIVE METABOLIC PANEL
ALT: 15 U/L (ref 0–44)
AST: 18 U/L (ref 15–41)
Albumin: 3.8 g/dL (ref 3.5–5.0)
Alkaline Phosphatase: 88 U/L (ref 38–126)
Anion gap: 13 (ref 5–15)
BUN: 9 mg/dL (ref 8–23)
CO2: 24 mmol/L (ref 22–32)
Calcium: 9.1 mg/dL (ref 8.9–10.3)
Chloride: 99 mmol/L (ref 98–111)
Creatinine, Ser: 0.92 mg/dL (ref 0.61–1.24)
GFR, Estimated: >60 mL/min (ref >60)
Glucose, Bld: 96 mg/dL (ref 70–99)
Potassium: 3.9 mmol/L (ref 3.5–5.1)
Sodium: 136 mmol/L (ref 135–145)
Total Bilirubin: 0.5 mg/dL (ref 0.3–1.2)
Total Protein: 6.8 g/dL (ref 6.5–8.1)

## 2022-12-02 LAB — CBC WITH DIFFERENTIAL/PLATELET
Abs Immature Granulocytes: 0.01 10*3/uL (ref 0.00–0.07)
Basophils Absolute: 0.1 10*3/uL (ref 0.0–0.1)
Basophils Relative: 1 %
Eosinophils Absolute: 0.5 10*3/uL (ref 0.0–0.5)
Eosinophils Relative: 7 %
HCT: 43.5 % (ref 39.0–52.0)
Hemoglobin: 14.7 g/dL (ref 13.0–17.0)
Immature Granulocytes: 0 %
Lymphocytes Relative: 17 %
Lymphs Abs: 1.2 10*3/uL (ref 0.7–4.0)
MCH: 30.1 pg (ref 26.0–34.0)
MCHC: 33.8 g/dL (ref 30.0–36.0)
MCV: 89.1 fL (ref 80.0–100.0)
Monocytes Absolute: 0.6 10*3/uL (ref 0.1–1.0)
Monocytes Relative: 9 %
Neutro Abs: 4.8 10*3/uL (ref 1.7–7.7)
Neutrophils Relative %: 66 %
Platelets: 222 10*3/uL (ref 150–400)
RBC: 4.88 MIL/uL (ref 4.22–5.81)
RDW: 13.7 % (ref 11.5–15.5)
WBC: 7.1 10*3/uL (ref 4.0–10.5)
nRBC: 0 % (ref 0.0–0.2)

## 2022-12-02 LAB — TROPONIN I (HIGH SENSITIVITY)
Troponin I (High Sensitivity): 24 ng/L — ABNORMAL HIGH (ref <18)
Troponin I (High Sensitivity): 24 ng/L — ABNORMAL HIGH (ref <18)

## 2022-12-02 NOTE — ED Provider Notes (Signed)
[place] EMERGENCY DEPARTMENT AT [facility] Provider Note   CSN: [id number] Arrival date & time: 12/02/22  1738     History  Chief Complaint  Patient presents with   Chest Pain    Timothy [name] is a 67 y.o. male.   Chest Pain I patient reportedly brought in by EMS for chest pain.  Reportedly began around 5:00 tonight.  Patient states however he is not here for chest pain is here because he does not sleep well because he is second coming of [employer].  Somewhat difficult to get history from him.  Denies heart problems.  Denies shortness of breath.  Denies headache.  I am able to view some of the [place] notes and apparently has a diagnosis of paranoid schizophrenia.      Home Medications Prior to Admission medications   Medication Sig Start Date End Date Taking? Authorizing Provider  bisacodyl (DULCOLAX) 5 MG EC tablet Take 5 mg by mouth daily as needed for moderate constipation.    [provider]  Calcium Carb-Cholecalciferol (CALCIUM-VITAMIN D) 500-400 MG-UNIT TABS Take 1 tablet by mouth daily.    [provider]  cetirizine (ZYRTEC ALLERGY) 10 MG tablet Take 1 tablet (10 mg total) by mouth daily. 07/19/20   Rhys Martini, PA-C  cyclobenzaprine (FLEXERIL) 10 MG tablet Take 1 tablet (10 mg total) by mouth 2 (two) times daily as needed for muscle spasms. 02/27/21   Alvira Monday, MD  diclofenac (VOLTAREN) 50 MG EC tablet Take 1 tablet (50 mg total) by mouth 2 (two) times daily as needed (pain). 12/17/21   Zenia Resides, MD  diclofenac Sodium (VOLTAREN) 1 % GEL Apply 2 g topically 4 (four) times daily. 11/20/20   Valinda Hoar, NP  diphenhydrAMINE (BENADRYL) 25 mg capsule Take 25 mg by mouth at bedtime as needed for itching or sleep.    [provider]  gabapentin (NEURONTIN) 300 MG capsule Take 1 capsule (300 mg total) by mouth 3 (three) times daily. 06/18/20   Wallis Bamberg, PA-C  haloperidol (HALDOL) 5 MG tablet Take 5 mg by mouth at  bedtime.    [provider]  haloperidol decanoate (HALDOL DECANOATE) 50 MG/ML injection Inject 50 mg into the muscle every 28 (twenty-eight) days.    [provider]  hydroxypropyl methylcellulose / hypromellose (ISOPTO TEARS / GONIOVISC) 2.5 % ophthalmic solution Place 1 drop into both eyes 3 (three) times daily as needed for dry eyes. 08/17/21   Ernie Avena, MD  lactulose (CHRONULAC) 10 GM/15ML solution Take 20 g by mouth 3 (three) times a week.    [provider]  levofloxacin (LEVAQUIN) 500 MG tablet Take 1.5 tablets (750 mg total) by mouth daily. 05/28/22   Netta Corrigan, PA-C  mupirocin cream (BACTROBAN) 2 % Apply 1 Application topically 2 (two) times daily. 11/04/21   Ellsworth Lennox, PA-C  omeprazole (PRILOSEC) 20 MG capsule Take 40 mg by mouth 2 (two) times daily before a meal.    [provider]  oxybutynin (DITROPAN) 5 MG tablet Take 5 mg by mouth 3 (three) times daily as needed for bladder spasms.    [provider]  oxyCODONE (ROXICODONE) 15 MG immediate release tablet Take 15 mg by mouth 3 (three) times daily as needed. 12/13/21   [provider]  phenazopyridine (PYRIDIUM) 200 MG tablet Take 1 tablet (200 mg total) by mouth 3 (three) times daily. 05/24/22   Defelice, Para March, NP  polyethylene glycol (MIRALAX / GLYCOLAX) packet Take  17 g by mouth 3 (three) times a week.    [provider]  sertraline (ZOLOFT) 50 MG tablet Take 50 mg by mouth daily.    [provider]  VIAGRA 100 MG tablet Take 100 mg by mouth as needed for erectile dysfunction.  01/24/16   [provider]      Allergies    Penicillins    Review of Systems   Review of Systems  Cardiovascular:  Positive for chest pain.    Physical Exam Updated Vital Signs BP (!) 118/55 (BP Location: Left Arm)   Pulse 75   Temp 97.7 F (36.5 C) (Oral)   Resp 18   Ht 6\' 2"  (1.88 m)   Wt 136.1 kg   SpO2 96%   BMI 38.52 kg/m  Physical Exam Vitals  and nursing note reviewed.  Cardiovascular:     Rate and Rhythm: Normal rate.  Pulmonary:     Breath sounds: No wheezing or rhonchi.  Chest:     Chest wall: No tenderness.  Abdominal:     Tenderness: There is no abdominal tenderness.  Neurological:     Mental Status: He is alert.     Comments: Patient awake and able to answer questions, however does make somewhat nonsensical statements.     ED Results / Procedures / Treatments   Labs (all labs ordered are listed, but only abnormal results are displayed) Labs Reviewed  TROPONIN I (HIGH SENSITIVITY) - Abnormal; Notable for the following components:      Result Value   Troponin I (High Sensitivity) 24 (*)    All other components within normal limits  TROPONIN I (HIGH SENSITIVITY) - Abnormal; Notable for the following components:   Troponin I (High Sensitivity) 24 (*)    All other components within normal limits  ETHANOL  CBC WITH DIFFERENTIAL/PLATELET  COMPREHENSIVE METABOLIC PANEL  RAPID URINE DRUG SCREEN, HOSP PERFORMED    EKG EKG Interpretation Date/Time:  Tuesday December 02 2022 17:42:42 EDT Ventricular Rate:  80 PR Interval:  174 QRS Duration:  139 QT Interval:  389 QTC Calculation: 449 R Axis:   -76  Text Interpretation: Sinus rhythm Right bundle branch block No significant change since last tracing Confirmed by Benjiman Core [phone]) on 12/02/2022 5:48:03 PM  Radiology DG Chest Portable 1 View  Result Date: 12/02/2022 CLINICAL DATA:  Chest pain EXAM: PORTABLE CHEST 1 VIEW COMPARISON:  05/27/2022 FINDINGS: Mild cardiomegaly. No acute airspace disease or effusion. No pneumothorax. IMPRESSION: Mild cardiomegaly. Electronically Signed   By: Jasmine Pang M.D.   On: 12/02/2022 19:24    Procedures Procedures    Medications Ordered in ED Medications - No data to display  ED Course/ Medical Decision Making/ A&P                             Medical Decision Making Amount and/or Complexity of Data Reviewed Labs:  ordered. Radiology: ordered.   Patient with chest pain.  Anterior chest.  Does have some edema on his legs.  Troponin slightly elevated but stable.  However patient denies chest pain.  States he is here because he is tired because he is the second coming of Christ. Likely does have some delusions, however does not want to stay for evaluation for it.  Do not think there is criteria to IVC him at this point.  Should follow-up as an outpatient.        Final Clinical Impression(s) / ED Diagnoses  Final diagnoses:  Nonspecific chest pain    Rx / DC Orders ED Discharge Orders     None         Benjiman Core, MD 12/02/22 2253

## 2022-12-02 NOTE — Progress Notes (Signed)
   12/02/22 1830  Spiritual Encounters  Type of Visit Initial  Care provided to: Patient  Conversation partners present during encounter Nurse  Referral source Nurse (RN/NT/LPN)  Reason for visit Routine spiritual support  OnCall Visit No   Chaplain responded to a request to visit a patient in the ED area while on the unit. The patient, Daundre presented as confused and to who he is and spoke of many topics randomly, [employer], the tomb of the unknown soldier, church, When asked what he was doing before he came into the hospital he replied that he thought he was having a heart attack. He said he had terrible pain in his chest. As I departed I encouraged him to rest. He asked for something to help him sleep and I passed that requested on to his nurse.   Valerie Roys [facility]  [phone]

## 2022-12-02 NOTE — ED Triage Notes (Addendum)
Pt to ED via [facility] c/o chest pain, from home. Reports started today aprox 1 hour ago, reports relief with given Aspirin 324MG  That was given by EMS and now denies CP. Denies SHOB, N/V.  LAST VS: 164/84, 96HR, 96%RA, CBG 136, RR20   Pt uses a WC at home

## 2022-12-03 MED ORDER — ACETAMINOPHEN 325 MG PO TABS
650.0000 mg | ORAL_TABLET | Freq: Once | ORAL | Status: AC
  Administered 2022-12-03: 650 mg via ORAL
  Filled 2022-12-03: qty 2

## 2022-12-03 NOTE — ED Notes (Signed)
Pt states that he self catheterizes. Pt given a kit and assisted with using it.

## 2022-12-15 ENCOUNTER — Encounter (HOSPITAL_COMMUNITY): Payer: Self-pay | Admitting: Emergency Medicine

## 2022-12-15 ENCOUNTER — Ambulatory Visit (HOSPITAL_COMMUNITY)
Admission: EM | Admit: 2022-12-15 | Discharge: 2022-12-15 | Disposition: A | Discharge status: Home / Self Care | Payer: No Typology Code available for payment source | Attending: Family Medicine | Admitting: Family Medicine

## 2022-12-15 DIAGNOSIS — F200 Paranoid schizophrenia: Secondary | ICD-10-CM

## 2022-12-15 NOTE — ED Provider Notes (Signed)
[facility]   [id number] 12/15/22 Arrival Time: 1011  ASSESSMENT & PLAN:  1. Paranoid schizophrenia (HCC)    Pt wishes to leave without any medical workup or discussion. Appears stable without confusion or gross physical impairment. Stable upon d/c.  Reviewed expectations re: course of current medical issues. Questions answered. Outlined signs and symptoms indicating need for more acute intervention. Patient verbalized understanding. After Visit Summary given.   SUBJECTIVE: History from: patient. Timothy [name] is a 67 y.o. male who, per triage nurse, reports having an injection in back about 2 weeks at [facility]; now having worse pain in neck and back. Reports has medications with him but would like to speak with doctor about what to do about his pain.   When I enter the room, he says that he only wishes to give our clinic a [employer] baseball. "That's all I came here to do and I want to go home now." Denies any current pain. Denies wanting to discuss medications.   OBJECTIVE:  Vitals:   12/15/22 1029 12/15/22 1032  BP:  124/81  Pulse:  92  Resp:  20  Temp:  98.4 F (36.9 C)  TempSrc:  Oral  SpO2:  95%  Weight: 128.5 kg     General appearance: alert; no distress; does not appear altered or intoxicated. Eyes: PERRLA; EOMI; conjunctiva normal HENT: normocephalic; atraumatic Neck: supple  Lungs: clear to auscultation bilaterally; unlabored Heart: regular Extremities: no edema; symmetrical with no gross deformities Skin: warm and dry Neurologic: normal gait but slow and uses a walker; normal symmetric reflexes Psychological: alert and cooperative; normal mood and affect; speaks clearly; does not appear confused   Allergies  Allergen Reactions   Penicillins Anaphylaxis    Has patient had a PCN reaction causing immediate rash, facial/tongue/throat swelling, SOB or lightheadedness with hypotension: Yes Has patient had a PCN reaction causing severe rash  involving mucus membranes or skin necrosis: No Has patient had a PCN reaction that required hospitalization: Yes Has patient had a PCN reaction occurring within the last 10 years: No If all of the above answers are "NO", then may proceed with Cephalosporin use.     Past Medical History:  Diagnosis Date   Behavioral disorder    Cancer (HCC)    Constipation    Gonorrhea    Social History   Socioeconomic History   Marital status: Single    Spouse name: Not on file   Number of children: Not on file   Years of education: Not on file   Highest education level: Not on file  Occupational History   Not on file  Tobacco Use   Smoking status: Former    Current packs/day: 1.00    Types: Cigarettes   Smokeless tobacco: Never  Vaping Use   Vaping status: Never Used  Substance and Sexual Activity   Alcohol use: No   Drug use: No   Sexual activity: Not on file  Other Topics Concern   Not on file  Social History Narrative   ** Merged History Encounter **       Social Determinants of Health   Financial Resource Strain: Not on file  Food Insecurity: Not on file  Transportation Needs: Not on file  Physical Activity: Not on file  Stress: Not on file  Social Connections: Not on file  Intimate Partner Violence: Not on file   Family History  Family history unknown: Yes   Past Surgical History:  Procedure Laterality Date  BACK SURGERY       Mardella Layman, MD 12/15/22 [phone]

## 2022-12-15 NOTE — ED Triage Notes (Addendum)
Pt reports got an injection in back about 2 weeks at [facility] and having worse pain in neck and back. Reports has medications with him but would like to speak with doctor about what to do about his pain.

## 2022-12-28 ENCOUNTER — Other Ambulatory Visit: Payer: Self-pay

## 2022-12-28 ENCOUNTER — Emergency Department (HOSPITAL_COMMUNITY)
Admission: EM | Admit: 2022-12-28 | Discharge: 2022-12-28 | Discharge status: Against Medical Advice | Payer: No Typology Code available for payment source | Attending: Emergency Medicine | Admitting: Emergency Medicine

## 2022-12-28 DIAGNOSIS — R531 Weakness: Secondary | ICD-10-CM | POA: Diagnosis not present

## 2022-12-28 DIAGNOSIS — R2243 Localized swelling, mass and lump, lower limb, bilateral: Secondary | ICD-10-CM | POA: Insufficient documentation

## 2022-12-28 DIAGNOSIS — T675XXA Heat exhaustion, unspecified, initial encounter: Secondary | ICD-10-CM | POA: Diagnosis not present

## 2022-12-28 DIAGNOSIS — T679XXA Effect of heat and light, unspecified, initial encounter: Secondary | ICD-10-CM

## 2022-12-28 MED ORDER — FUROSEMIDE 20 MG PO TABS
20.0000 mg | ORAL_TABLET | Freq: Once | ORAL | Status: AC
  Administered 2022-12-28: 20 mg via ORAL
  Filled 2022-12-28: qty 1

## 2022-12-28 MED ORDER — BARRIER CREAM NON-SPECIFIED: 1.0000 | TOPICAL_CREAM | Freq: Two times a day (BID) | TOPICAL | Status: DC | PRN

## 2022-12-28 MED ORDER — ACETAMINOPHEN 500 MG PO TABS
1000.0000 mg | ORAL_TABLET | Freq: Once | ORAL | Status: AC
  Administered 2022-12-28: 1000 mg via ORAL
  Filled 2022-12-28: qty 2

## 2022-12-28 NOTE — ED Notes (Signed)
Pt provided a blue scrub top due to his top being wet. Provided a pt belongings bag so he could place all of his items in the bag. Pt used his walker to walk to lobby and leave. Pt refused to want anything done to provide him proper care. Pt just wanted a water pill and oxycodone. He was given tylenol, lasix PO, along with barrier cream for sore on his bottom.

## 2022-12-28 NOTE — ED Notes (Signed)
Pt refused EKG, pt stated "I am fine I don't need that, I need an in and out cath and fluid pill" RN is aware of this .

## 2022-12-28 NOTE — ED Notes (Signed)
Pt was given a fluid pill and tylenol PO, as well as barrier cream. Pt says he is leaving and refuses to sign AMA form. He is a&o4 to his normal baseline.

## 2022-12-28 NOTE — ED Provider Notes (Signed)
[place] EMERGENCY DEPARTMENT AT [facility] Provider Note   CSN: [id number] Arrival date & time: 12/28/22  1434     History  Chief Complaint  Patient presents with   Heat Exposure    Pt coming in via EMS from sidewalk. Pt states he was feeling hot and laid on the hot ground and on some hot metal. Pt complains of pain all over. Initially pt was found hypotensive while on the ground. Blood pressure increased to normotensive when sitting. Pt denies cp/shob.    Timothy [name] is a 67 y.o. male.  The history is provided by the patient, medical records and the EMS personnel. No language interpreter was used.     67 year old male significant history paranoid schizophrenia, brought here via EMS from a sidewalk on the street for evaluation of generalized weakness.  Patient states he is here for his fluid medication as well as his pain medication because his leg and his bodies are achy.  He attributed to having to sit down on the ground for prolonged period of time while he was at church earlier today.  He then proceeded to talk about how he just watched some sacrificial ceremony earlier, and also recall the time that he has to go to identified his nephew after his organ was harvested by her doctor.  He also complaining of pain to his legs and back ongoing for 44 years since he was in [place].  He is complaining of leg swelling but denies any significant shortness of breath.  Denies worsening swelling.  Denies any change in medication recently.  Patient is a poor historian.  He request for cream for his bottom.  Home Medications Prior to Admission medications   Medication Sig Start Date End Date Taking? Authorizing Provider  bisacodyl (DULCOLAX) 5 MG EC tablet Take 5 mg by mouth daily as needed for moderate constipation.    [provider]  Calcium Carb-Cholecalciferol (CALCIUM-VITAMIN D) 500-400 MG-UNIT TABS Take 1 tablet by mouth daily.    [provider]   cetirizine (ZYRTEC ALLERGY) 10 MG tablet Take 1 tablet (10 mg total) by mouth daily. 07/19/20   Rhys Martini, PA-C  cyclobenzaprine (FLEXERIL) 10 MG tablet Take 1 tablet (10 mg total) by mouth 2 (two) times daily as needed for muscle spasms. 02/27/21   Alvira Monday, MD  diclofenac (VOLTAREN) 50 MG EC tablet Take 1 tablet (50 mg total) by mouth 2 (two) times daily as needed (pain). 12/17/21   Zenia Resides, MD  diclofenac Sodium (VOLTAREN) 1 % GEL Apply 2 g topically 4 (four) times daily. 11/20/20   Valinda Hoar, NP  diphenhydrAMINE (BENADRYL) 25 mg capsule Take 25 mg by mouth at bedtime as needed for itching or sleep.    [provider]  gabapentin (NEURONTIN) 300 MG capsule Take 1 capsule (300 mg total) by mouth 3 (three) times daily. 06/18/20   Wallis Bamberg, PA-C  haloperidol (HALDOL) 5 MG tablet Take 5 mg by mouth at bedtime.    [provider]  haloperidol decanoate (HALDOL DECANOATE) 50 MG/ML injection Inject 50 mg into the muscle every 28 (twenty-eight) days.    [provider]  hydroxypropyl methylcellulose / hypromellose (ISOPTO TEARS / GONIOVISC) 2.5 % ophthalmic solution Place 1 drop into both eyes 3 (three) times daily as needed for dry eyes. 08/17/21   Ernie Avena, MD  lactulose (CHRONULAC) 10 GM/15ML solution Take 20 g by mouth 3 (three) times a week.    [provider]  levofloxacin (LEVAQUIN) 500 MG tablet Take 1.5 tablets (750 mg total) by mouth daily. 05/28/22   Netta Corrigan, PA-C  mupirocin cream (BACTROBAN) 2 % Apply 1 Application topically 2 (two) times daily. 11/04/21   Ellsworth Lennox, PA-C  omeprazole (PRILOSEC) 20 MG capsule Take 40 mg by mouth 2 (two) times daily before a meal.    [provider]  oxybutynin (DITROPAN) 5 MG tablet Take 5 mg by mouth 3 (three) times daily as needed for bladder spasms.    [provider]  oxyCODONE (ROXICODONE) 15 MG immediate release tablet Take 15 mg by mouth 3 (three) times daily  as needed. 12/13/21   [provider]  phenazopyridine (PYRIDIUM) 200 MG tablet Take 1 tablet (200 mg total) by mouth 3 (three) times daily. 05/24/22   Defelice, Para March, NP  polyethylene glycol (MIRALAX / GLYCOLAX) packet Take 17 g by mouth 3 (three) times a week.    [provider]  sertraline (ZOLOFT) 50 MG tablet Take 50 mg by mouth daily.    [provider]  VIAGRA 100 MG tablet Take 100 mg by mouth as needed for erectile dysfunction.  01/24/16   [provider]      Allergies    Penicillins    Review of Systems   Review of Systems  All other systems reviewed and are negative.   Physical Exam Updated Vital Signs BP 121/83 (BP Location: Right Arm)   Pulse 81   Temp 98 F (36.7 C) (Oral)   Resp 20   Ht 6' (1.829 m)   Wt 130.6 kg   SpO2 96%   BMI 39.06 kg/m  Physical Exam Vitals and nursing note reviewed.  Constitutional:      General: He is not in acute distress.    Appearance: He is well-developed.     Comments: Obese male laying in bed not in any acute discomfort.  He does appear sweaty.  HENT:     Head: Atraumatic.  Eyes:     Conjunctiva/sclera: Conjunctivae normal.  Cardiovascular:     Rate and Rhythm: Normal rate and regular rhythm.     Pulses: Normal pulses.     Heart sounds: Normal heart sounds.  Pulmonary:     Effort: Pulmonary effort is normal.     Breath sounds: Normal breath sounds.  Abdominal:     Palpations: Abdomen is soft.  Musculoskeletal:     Cervical back: Neck supple.     Right lower leg: Edema present.     Left lower leg: Edema present.     Comments: 3+ pitting edema to bilateral lower extremities extending towards the thigh  Skin:    Findings: No rash.  Neurological:     Mental Status: He is alert and oriented to person, place, and time.  Psychiatric:        Mood and Affect: Mood normal.        Speech: Speech normal.        Behavior: Behavior is cooperative.     ED Results / Procedures / Treatments    Labs (all labs ordered are listed, but only abnormal results are displayed) Labs Reviewed - No data to display  EKG None  Radiology No results found.  Procedures Procedures    Medications Ordered in ED Medications - No data to display  ED Course/ Medical Decision Making/ A&P  Medical Decision Making Risk OTC drugs. Prescription drug management.   BP 121/83 (BP Location: Right Arm)   Pulse 81   Temp 98 F (36.7 C) (Oral)   Resp 20   Ht 6' (1.829 m)   Wt 130.6 kg   SpO2 96%   BMI 39.06 kg/m   73:41 PM  67 year old male significant history paranoid schizophrenia, brought here via EMS from a sidewalk on the street for evaluation of generalized weakness.  Patient states he is here for his fluid medication as well as his pain medication because his leg and his bodies are achy.  He attributed to having to sit down on the ground for prolonged period of time while he was at church earlier today.  He then proceeded to talk about how he just watched some sacrificial ceremony earlier, and also recall the time that he has to go to identified his nephew after his organ was harvested by her doctor.  He also complaining of pain to his legs and back ongoing for 44 years since he was in [place].  He is complaining of leg swelling but denies any significant shortness of breath.  Denies worsening swelling.  Denies any change in medication recently.  Patient is a poor historian.  He request for cream for his bottom.  On exam, patient is resting comfortably appears to be in no acute discomfort.  He is a little bit sweaty.  He does have significant peripheral edema to bilateral lower extremities but it appears to be chronic.  No uvular swelling to suggest DVT.  Examination of the skin without any concerning skin finding.  He does have some mild skin breakdown to his perineum without signs of infection.  Will provide barrier cream.  Initial plan to obtain screening labs  but patient declined and requesting only for fluid pills and some pain medication.  I suspect he is at his baseline with paranoid schizophrenia but no report of any SI HI.  Does not think patient would necessitate hospital stay for new psychiatric illness.  I have low suspicion for DVT or PE.  His vital signs overall reassuring.  After further discussion, will give patient return milligrams of Lasix as well as 1000 mg of Tylenol along with barrier cream for his perineum.        Final Clinical Impression(s) / ED Diagnoses Final diagnoses:  Heat exposure, initial encounter    Rx / DC Orders ED Discharge Orders     None         Fayrene Helper, PA-C 12/28/22 1559    Rolan Bucco, MD 12/28/22 1750

## 2023-01-11 ENCOUNTER — Other Ambulatory Visit: Payer: Self-pay

## 2023-01-11 ENCOUNTER — Emergency Department (HOSPITAL_COMMUNITY)
Admission: EM | Admit: 2023-01-11 | Discharge: 2023-01-11 | Disposition: A | Discharge status: Home / Self Care | Payer: No Typology Code available for payment source | Attending: Emergency Medicine | Admitting: Emergency Medicine

## 2023-01-11 DIAGNOSIS — G894 Chronic pain syndrome: Secondary | ICD-10-CM

## 2023-01-11 DIAGNOSIS — M542 Cervicalgia: Secondary | ICD-10-CM | POA: Diagnosis not present

## 2023-01-11 DIAGNOSIS — G8929 Other chronic pain: Secondary | ICD-10-CM | POA: Insufficient documentation

## 2023-01-11 MED ORDER — OXYCODONE-ACETAMINOPHEN 5-325 MG PO TABS
2.0000 | ORAL_TABLET | Freq: Once | ORAL | Status: AC
  Administered 2023-01-11: 2 via ORAL
  Filled 2023-01-11: qty 2

## 2023-01-11 NOTE — Discharge Instructions (Signed)
Follow up with your doctor for pain management. °

## 2023-01-11 NOTE — ED Triage Notes (Signed)
Chief Complaint  Patient presents with   Pain   Pt presents to ED 12 via EMS from home with complaint of "pain from neck down."  Pt has chronic pain and takes medications for same.  Pt states pain is getting worse.  Pt states it is almost his birthday and that is why his "pain is getting worse."  Per EMS, pt was about to stand and pivot at home.  Pt uses wheelchair at home.  Pt states he could not transfer to stretcher upon arrival to ED.

## 2023-01-11 NOTE — ED Provider Notes (Signed)
[place] EMERGENCY DEPARTMENT AT [facility] Provider Note   CSN: [id number] Arrival date & time: 01/11/23  1939     History  Chief Complaint  Patient presents with   Pain    Timothy [name] is a 67 y.o. male.  67 year old male who presents with chronic pain.  States he has pain from neck down.  States that this has been going on for some time.  Denies any recent history of trauma.  He is in wheelchair at baseline.  States his pain has been getting worse but is unsure for how long.  States has been compliant with this medication.       Home Medications Prior to Admission medications   Medication Sig Start Date End Date Taking? Authorizing Provider  bisacodyl (DULCOLAX) 5 MG EC tablet Take 5 mg by mouth daily as needed for moderate constipation.    [provider]  Calcium Carb-Cholecalciferol (CALCIUM-VITAMIN D) 500-400 MG-UNIT TABS Take 1 tablet by mouth daily.    [provider]  cetirizine (ZYRTEC ALLERGY) 10 MG tablet Take 1 tablet (10 mg total) by mouth daily. 07/19/20   Rhys Martini, PA-C  cyclobenzaprine (FLEXERIL) 10 MG tablet Take 1 tablet (10 mg total) by mouth 2 (two) times daily as needed for muscle spasms. 02/27/21   Alvira Monday, MD  diclofenac (VOLTAREN) 50 MG EC tablet Take 1 tablet (50 mg total) by mouth 2 (two) times daily as needed (pain). 12/17/21   Zenia Resides, MD  diclofenac Sodium (VOLTAREN) 1 % GEL Apply 2 g topically 4 (four) times daily. 11/20/20   Valinda Hoar, NP  diphenhydrAMINE (BENADRYL) 25 mg capsule Take 25 mg by mouth at bedtime as needed for itching or sleep.    [provider]  gabapentin (NEURONTIN) 300 MG capsule Take 1 capsule (300 mg total) by mouth 3 (three) times daily. 06/18/20   Wallis Bamberg, PA-C  haloperidol (HALDOL) 5 MG tablet Take 5 mg by mouth at bedtime.    [provider]  haloperidol decanoate (HALDOL DECANOATE) 50 MG/ML injection Inject 50 mg into the muscle every 28  (twenty-eight) days.    [provider]  hydroxypropyl methylcellulose / hypromellose (ISOPTO TEARS / GONIOVISC) 2.5 % ophthalmic solution Place 1 drop into both eyes 3 (three) times daily as needed for dry eyes. 08/17/21   Ernie Avena, MD  lactulose (CHRONULAC) 10 GM/15ML solution Take 20 g by mouth 3 (three) times a week.    [provider]  levofloxacin (LEVAQUIN) 500 MG tablet Take 1.5 tablets (750 mg total) by mouth daily. 05/28/22   Netta Corrigan, PA-C  mupirocin cream (BACTROBAN) 2 % Apply 1 Application topically 2 (two) times daily. 11/04/21   Ellsworth Lennox, PA-C  omeprazole (PRILOSEC) 20 MG capsule Take 40 mg by mouth 2 (two) times daily before a meal.    [provider]  oxybutynin (DITROPAN) 5 MG tablet Take 5 mg by mouth 3 (three) times daily as needed for bladder spasms.    [provider]  oxyCODONE (ROXICODONE) 15 MG immediate release tablet Take 15 mg by mouth 3 (three) times daily as needed. 12/13/21   [provider]  phenazopyridine (PYRIDIUM) 200 MG tablet Take 1 tablet (200 mg total) by mouth 3 (three) times daily. 05/24/22   Defelice, Para March, NP  polyethylene glycol (MIRALAX / GLYCOLAX) packet Take 17 g by mouth 3 (three) times a week.    [provider]  sertraline (ZOLOFT) 50 MG tablet Take  50 mg by mouth daily.    [provider]  VIAGRA 100 MG tablet Take 100 mg by mouth as needed for erectile dysfunction.  01/24/16   [provider]      Allergies    Penicillins    Review of Systems   Review of Systems  All other systems reviewed and are negative.   Physical Exam Updated Vital Signs BP 109/63 (BP Location: Left Arm)   Pulse 76   Temp 99.6 F (37.6 C) (Oral)   Resp 18   Ht 1.829 m (6')   Wt 130.6 kg   SpO2 100%   BMI 39.06 kg/m  Physical Exam Vitals and nursing note reviewed.  Constitutional:      General: He is not in acute distress.    Appearance: Normal appearance. He is  well-developed. He is not toxic-appearing.  HENT:     Head: Normocephalic and atraumatic.  Eyes:     General: Lids are normal.     Conjunctiva/sclera: Conjunctivae normal.     Pupils: Pupils are equal, round, and reactive to light.  Neck:     Thyroid: No thyroid mass.     Trachea: No tracheal deviation.  Cardiovascular:     Rate and Rhythm: Normal rate and regular rhythm.     Heart sounds: Normal heart sounds. No murmur heard.    No gallop.  Pulmonary:     Effort: Pulmonary effort is normal. No respiratory distress.     Breath sounds: Normal breath sounds. No stridor. No decreased breath sounds, wheezing, rhonchi or rales.  Abdominal:     General: There is no distension.     Palpations: Abdomen is soft.     Tenderness: There is no abdominal tenderness. There is no rebound.  Musculoskeletal:        General: No tenderness. Normal range of motion.     Cervical back: Normal range of motion and neck supple.  Skin:    General: Skin is warm and dry.     Findings: No abrasion or rash.  Neurological:     Mental Status: He is alert and oriented to person, place, and time. Mental status is at baseline.     GCS: GCS eye subscore is 4. GCS verbal subscore is 5. GCS motor subscore is 6.     Cranial Nerves: Cranial nerves are intact. No cranial nerve deficit.     Sensory: No sensory deficit.     Motor: No tremor.     Comments: Patient states that his strength is at baseline  Psychiatric:        Attention and Perception: Attention normal.        Mood and Affect: Affect is flat.     ED Results / Procedures / Treatments   Labs (all labs ordered are listed, but only abnormal results are displayed) Labs Reviewed - No data to display  EKG None  Radiology No results found.  Procedures Procedures    Medications Ordered in ED Medications  oxyCODONE-acetaminophen (PERCOCET/ROXICET) 5-325 MG per tablet 2 tablet (2 tablets Oral Given 01/11/23 2017)    ED Course/ Medical Decision  Making/ A&P                                 Medical Decision Making Risk Prescription drug management.   Patient with worsening chronic pain.  Vital signs are stable.  Given 2 Percocets and will discharge back to his home  Final Clinical Impression(s) / ED Diagnoses Final diagnoses:  None    Rx / DC Orders ED Discharge Orders     None         Lorre Nick, MD 01/11/23 2047

## 2023-01-19 ENCOUNTER — Other Ambulatory Visit: Payer: Self-pay

## 2023-01-19 ENCOUNTER — Encounter (HOSPITAL_COMMUNITY): Payer: Self-pay

## 2023-01-19 ENCOUNTER — Emergency Department (HOSPITAL_COMMUNITY): Payer: No Typology Code available for payment source

## 2023-01-19 ENCOUNTER — Emergency Department (HOSPITAL_COMMUNITY)
Admission: EM | Admit: 2023-01-19 | Discharge: 2023-01-20 | Disposition: A | Discharge status: Home / Self Care | Payer: No Typology Code available for payment source | Attending: Emergency Medicine | Admitting: Emergency Medicine

## 2023-01-19 DIAGNOSIS — R7309 Other abnormal glucose: Secondary | ICD-10-CM | POA: Insufficient documentation

## 2023-01-19 DIAGNOSIS — M79671 Pain in right foot: Secondary | ICD-10-CM | POA: Diagnosis not present

## 2023-01-19 DIAGNOSIS — R600 Localized edema: Secondary | ICD-10-CM | POA: Insufficient documentation

## 2023-01-19 DIAGNOSIS — Z859 Personal history of malignant neoplasm, unspecified: Secondary | ICD-10-CM | POA: Insufficient documentation

## 2023-01-19 DIAGNOSIS — M79672 Pain in left foot: Secondary | ICD-10-CM | POA: Diagnosis present

## 2023-01-19 LAB — BASIC METABOLIC PANEL
Anion gap: 10 (ref 5–15)
BUN: 8 mg/dL (ref 8–23)
CO2: 27 mmol/L (ref 22–32)
Calcium: 9.8 mg/dL (ref 8.9–10.3)
Chloride: 103 mmol/L (ref 98–111)
Creatinine, Ser: 0.98 mg/dL (ref 0.61–1.24)
GFR, Estimated: >60 mL/min (ref >60)
Glucose, Bld: 100 mg/dL — ABNORMAL HIGH (ref 70–99)
Potassium: 4 mmol/L (ref 3.5–5.1)
Sodium: 140 mmol/L (ref 135–145)

## 2023-01-19 LAB — CBC WITH DIFFERENTIAL/PLATELET
Abs Immature Granulocytes: 0.03 10*3/uL (ref 0.00–0.07)
Basophils Absolute: 0 10*3/uL (ref 0.0–0.1)
Basophils Relative: 1 %
Eosinophils Absolute: 0.3 10*3/uL (ref 0.0–0.5)
Eosinophils Relative: 5 %
HCT: 41.4 % (ref 39.0–52.0)
Hemoglobin: 13.7 g/dL (ref 13.0–17.0)
Immature Granulocytes: 1 %
Lymphocytes Relative: 20 %
Lymphs Abs: 1.3 10*3/uL (ref 0.7–4.0)
MCH: 30 pg (ref 26.0–34.0)
MCHC: 33.1 g/dL (ref 30.0–36.0)
MCV: 90.8 fL (ref 80.0–100.0)
Monocytes Absolute: 0.5 10*3/uL (ref 0.1–1.0)
Monocytes Relative: 7 %
Neutro Abs: 4.5 10*3/uL (ref 1.7–7.7)
Neutrophils Relative %: 66 %
Platelets: 254 10*3/uL (ref 150–400)
RBC: 4.56 MIL/uL (ref 4.22–5.81)
RDW: 13.6 % (ref 11.5–15.5)
WBC: 6.7 10*3/uL (ref 4.0–10.5)
nRBC: 0 % (ref 0.0–0.2)

## 2023-01-19 MED ORDER — OXYCODONE-ACETAMINOPHEN 5-325 MG PO TABS
1.0000 | ORAL_TABLET | Freq: Once | ORAL | Status: AC
  Administered 2023-01-19: 1 via ORAL
  Filled 2023-01-19: qty 1

## 2023-01-19 NOTE — ED Notes (Signed)
Pt was very concerned about his "fluid pills" and wanted paramedic to relay that back to the provider. Provider informed via secure chat.

## 2023-01-19 NOTE — ED Notes (Signed)
Patient transported to X-ray 

## 2023-01-19 NOTE — ED Triage Notes (Signed)
Pt arrives with c/o bilateral foot pain. Per pt, pain has been going on for 45 years. Pt ambulatory to triage with crutches.

## 2023-01-19 NOTE — ED Provider Notes (Signed)
[place] EMERGENCY DEPARTMENT AT [facility] Provider Note   CSN: [id number] Arrival date & time: 01/19/23  2025     History {Add pertinent medical, surgical, social history, OB history to HPI:1} Chief Complaint  Patient presents with   Foot Pain    Timothy [name] is a 67 y.o. male.  HPI   Patient with medical history including cancer, psychiatric disorder, presenting with complaints of chronic pain.  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  States that his pain is constant, states it felt like it slightly got worse, he states that he has had chronic swelling in his legs, but states he feels like they have gotten slightly worse, he denies any calf tenderness, no history PEs or DVTs, denies any chest pain or shortness of breath.  Patient is not having other complaints, he is asking for food water and something for his pain.  Home Medications Prior to Admission medications   Medication Sig Start Date End Date Taking? Authorizing Provider  bisacodyl (DULCOLAX) 5 MG EC tablet Take 5 mg by mouth daily as needed for moderate constipation.    [provider]  Calcium Carb-Cholecalciferol (CALCIUM-VITAMIN D) 500-400 MG-UNIT TABS Take 1 tablet by mouth daily.    [provider]  cetirizine (ZYRTEC ALLERGY) 10 MG tablet Take 1 tablet (10 mg total) by mouth daily. 07/19/20   Rhys Martini, PA-C  cyclobenzaprine (FLEXERIL) 10 MG tablet Take 1 tablet (10 mg total) by mouth 2 (two) times daily as needed for muscle spasms. 02/27/21   Alvira Monday, MD  diclofenac (VOLTAREN) 50 MG EC tablet Take 1 tablet (50 mg total) by mouth 2 (two) times daily as needed (pain). 12/17/21   Zenia Resides, MD  diclofenac Sodium (VOLTAREN) 1 % GEL Apply 2 g topically 4 (four) times daily. 11/20/20   Valinda Hoar, NP  diphenhydrAMINE  (BENADRYL) 25 mg capsule Take 25 mg by mouth at bedtime as needed for itching or sleep.    [provider]  gabapentin (NEURONTIN) 300 MG capsule Take 1 capsule (300 mg total) by mouth 3 (three) times daily. 06/18/20   Wallis Bamberg, PA-C  haloperidol (HALDOL) 5 MG tablet Take 5 mg by mouth at bedtime.    [provider]  haloperidol decanoate (HALDOL DECANOATE) 50 MG/ML injection Inject 50 mg into the muscle every 28 (twenty-eight) days.    [provider]  hydroxypropyl methylcellulose / hypromellose (ISOPTO TEARS / GONIOVISC) 2.5 % ophthalmic solution Place 1 drop into both eyes 3 (three) times daily as needed for dry eyes. 08/17/21   Ernie Avena, MD  lactulose (CHRONULAC) 10 GM/15ML solution Take 20 g by mouth 3 (three) times a week.    [provider]  levofloxacin (LEVAQUIN) 500 MG tablet Take 1.5 tablets (750 mg total) by mouth daily. 05/28/22   Netta Corrigan, PA-C  mupirocin cream (BACTROBAN) 2 % Apply 1 Application topically 2 (two) times daily. 11/04/21   Ellsworth Lennox, PA-C  omeprazole (PRILOSEC) 20 MG capsule Take 40 mg by mouth 2 (two) times daily before a meal.    [provider]  oxybutynin (DITROPAN) 5 MG tablet Take 5 mg by mouth 3 (three) times daily as needed for bladder spasms.    [provider]  oxyCODONE (ROXICODONE) 15 MG immediate release tablet Take  15 mg by mouth 3 (three) times daily as needed. 12/13/21   [provider]  phenazopyridine (PYRIDIUM) 200 MG tablet Take 1 tablet (200 mg total) by mouth 3 (three) times daily. 05/24/22   Defelice, Para March, NP  polyethylene glycol (MIRALAX / GLYCOLAX) packet Take 17 g by mouth 3 (three) times a week.    [provider]  sertraline (ZOLOFT) 50 MG tablet Take 50 mg by mouth daily.    [provider]  VIAGRA 100 MG tablet Take 100 mg by mouth as needed for erectile dysfunction.  01/24/16   [provider]      Allergies    Penicillins    Review  of Systems   Review of Systems  Constitutional:  Negative for chills and fever.  Respiratory:  Negative for shortness of breath.   Cardiovascular:  Positive for leg swelling. Negative for chest pain.  Gastrointestinal:  Negative for abdominal pain.  Neurological:  Negative for headaches.    Physical Exam Updated Vital Signs BP 118/81 (BP Location: Right Arm)   Pulse (!) 104   Temp 99 F (37.2 C) (Oral)   Resp 18   Ht 6' (1.829 m)   Wt 130.6 kg   SpO2 97%   BMI 39.06 kg/m  Physical Exam Vitals and nursing note reviewed.  Constitutional:      General: He is not in acute distress.    Appearance: He is not ill-appearing.     Comments: Unkept, disheveled  HENT:     Head: Normocephalic and atraumatic.     Nose: No congestion.  Eyes:     Conjunctiva/sclera: Conjunctivae normal.  Neck:     Comments: No noted JVD Cardiovascular:     Rate and Rhythm: Normal rate and regular rhythm.     Pulses: Normal pulses.     Heart sounds: No murmur heard.    No friction rub. No gallop.  Pulmonary:     Effort: No respiratory distress.     Breath sounds: No wheezing, rhonchi or rales.  Abdominal:     Palpations: Abdomen is soft.     Tenderness: There is no abdominal tenderness. There is no right CVA tenderness or left CVA tenderness.  Musculoskeletal:     Comments: Patient has noted 2+ pitting edema bilaterally, no leg swelling, no overlying skin changes, he has 2+ dorsal pedal pulses 2-second capillary refill, sensation tact light touch.  Skin:    General: Skin is warm and dry.  Neurological:     Mental Status: He is alert.  Psychiatric:        Mood and Affect: Mood normal.     ED Results / Procedures / Treatments   Labs (all labs ordered are listed, but only abnormal results are displayed) Labs Reviewed  BASIC METABOLIC PANEL  CBC WITH DIFFERENTIAL/PLATELET  BRAIN NATRIURETIC PEPTIDE    EKG None  Radiology No results found.  Procedures Procedures  {Document cardiac  monitor, telemetry assessment procedure when appropriate:1}  Medications Ordered in ED Medications  oxyCODONE-acetaminophen (PERCOCET/ROXICET) 5-325 MG per tablet 1 tablet (1 tablet Oral Given 01/19/23 2316)    ED Course/ Medical Decision Making/ A&P   {   Click here for ABCD2, HEART and other calculatorsREFRESH Note before signing :1}                              Medical Decision Making Amount and/or Complexity of Data Reviewed Labs: ordered. Radiology: ordered.  Risk  Prescription drug management.   This patient presents to the ED for concern of chronic pain, this involves an extensive number of treatment options, and is a complaint that carries with it a high risk of complications and morbidity.  The differential diagnosis includes DVT, limb ischemia, CHF,    Additional history obtained:  Additional history obtained from N/A External records from outside source obtained and [facility]   Co morbidities that complicate the patient evaluation  N/A  Social Determinants of Health:  Housing stability    Lab Tests:  I Ordered, and personally interpreted labs.  The pertinent results include: CBC unremarkable   Imaging Studies ordered:  I ordered imaging studies including chest x-ray I independently visualized and interpreted imaging which showed *** I agree with the radiologist interpretation   Cardiac Monitoring:  The patient was maintained on a cardiac monitor.  I personally viewed and interpreted the cardiac monitored which showed an underlying rhythm of: ***   Medicines ordered and prescription drug management:  I ordered medication including ***  for ***  I have reviewed the patients home medicines and have made adjustments as needed  Critical Interventions:  ***   Reevaluation:  With chronic pain, on my exam he does have notable edema in his lower legs, do not hear any rales on my exam no AC JVD, suspicion for CHF is lower, but will  obtain screening labs chest x-ray and reassess.    Consultations Obtained:  I requested consultation with the ***,  and discussed lab and imaging findings as well as pertinent plan - they recommend: ***    Test Considered:  ***    Rule out ****    Dispostion and problem list  After consideration of the diagnostic results and the patients response to treatment, I feel that the patent would benefit from ***.       {Document critical care time when appropriate:1} {Document review of labs and clinical decision tools ie heart score, Chads2Vasc2 etc:1}  {Document your independent review of radiology images, and any outside records:1} {Document your discussion with family members, caretakers, and with consultants:1} {Document social determinants of health affecting pt's care:1} {Document your decision making why or why not admission, treatments were needed:1} Final Clinical Impression(s) / ED Diagnoses Final diagnoses:  None    Rx / DC Orders ED Discharge Orders     None

## 2023-01-20 LAB — BRAIN NATRIURETIC PEPTIDE: B Natriuretic Peptide: 15.6 pg/mL (ref 0.0–100.0)

## 2023-01-20 MED ORDER — FUROSEMIDE 20 MG PO TABS
20.0000 mg | ORAL_TABLET | Freq: Once | ORAL | Status: AC
  Administered 2023-01-20: 20 mg via ORAL
  Filled 2023-01-20: qty 1

## 2023-01-20 MED ORDER — FUROSEMIDE 20 MG PO TABS
20.0000 mg | ORAL_TABLET | Freq: Every day | ORAL | 0 refills | Status: DC
Start: 2023-01-20 — End: 2023-07-22

## 2023-01-20 NOTE — Discharge Instructions (Signed)
Suspect some of your pain is from increased swelling in your feet, I recommend compression stockings, keeping your legs elevated when not use.  I have given you an fluid pill please take as prescribed.  I would follow-up with community health and wellness for further evaluation.  Come back to the emergency department if you develop chest pain, shortness of breath, severe abdominal pain, uncontrolled nausea, vomiting, diarrhea.

## 2023-04-13 ENCOUNTER — Emergency Department (HOSPITAL_COMMUNITY)
Admission: EM | Admit: 2023-04-13 | Discharge: 2023-04-13 | Disposition: A | Discharge status: Home / Self Care | Payer: No Typology Code available for payment source | Attending: Student | Admitting: Student

## 2023-04-13 ENCOUNTER — Emergency Department (HOSPITAL_COMMUNITY): Payer: No Typology Code available for payment source

## 2023-04-13 ENCOUNTER — Other Ambulatory Visit: Payer: Self-pay

## 2023-04-13 DIAGNOSIS — Z859 Personal history of malignant neoplasm, unspecified: Secondary | ICD-10-CM | POA: Insufficient documentation

## 2023-04-13 DIAGNOSIS — Z87891 Personal history of nicotine dependence: Secondary | ICD-10-CM | POA: Diagnosis not present

## 2023-04-13 DIAGNOSIS — R1084 Generalized abdominal pain: Secondary | ICD-10-CM | POA: Insufficient documentation

## 2023-04-13 DIAGNOSIS — R109 Unspecified abdominal pain: Secondary | ICD-10-CM | POA: Diagnosis present

## 2023-04-13 LAB — CBC WITH DIFFERENTIAL/PLATELET
Abs Immature Granulocytes: 0.01 10*3/uL (ref 0.00–0.07)
Basophils Absolute: 0 10*3/uL (ref 0.0–0.1)
Basophils Relative: 1 %
Eosinophils Absolute: 0.3 10*3/uL (ref 0.0–0.5)
Eosinophils Relative: 5 %
HCT: 42.7 % (ref 39.0–52.0)
Hemoglobin: 14.3 g/dL (ref 13.0–17.0)
Immature Granulocytes: 0 %
Lymphocytes Relative: 22 %
Lymphs Abs: 1.1 10*3/uL (ref 0.7–4.0)
MCH: 30.9 pg (ref 26.0–34.0)
MCHC: 33.5 g/dL (ref 30.0–36.0)
MCV: 92.2 fL (ref 80.0–100.0)
Monocytes Absolute: 0.4 10*3/uL (ref 0.1–1.0)
Monocytes Relative: 8 %
Neutro Abs: 3.1 10*3/uL (ref 1.7–7.7)
Neutrophils Relative %: 64 %
Platelets: 272 10*3/uL (ref 150–400)
RBC: 4.63 MIL/uL (ref 4.22–5.81)
RDW: 14.5 % (ref 11.5–15.5)
WBC: 5 10*3/uL (ref 4.0–10.5)
nRBC: 0 % (ref 0.0–0.2)

## 2023-04-13 LAB — COMPREHENSIVE METABOLIC PANEL
ALT: 12 U/L (ref 0–44)
AST: 15 U/L (ref 15–41)
Albumin: 3.5 g/dL (ref 3.5–5.0)
Alkaline Phosphatase: 61 U/L (ref 38–126)
Anion gap: 7 (ref 5–15)
BUN: 9 mg/dL (ref 8–23)
CO2: 27 mmol/L (ref 22–32)
Calcium: 8.9 mg/dL (ref 8.9–10.3)
Chloride: 103 mmol/L (ref 98–111)
Creatinine, Ser: 1.2 mg/dL (ref 0.61–1.24)
GFR, Estimated: >60 mL/min (ref >60)
Glucose, Bld: 100 mg/dL — ABNORMAL HIGH (ref 70–99)
Potassium: 3.7 mmol/L (ref 3.5–5.1)
Sodium: 137 mmol/L (ref 135–145)
Total Bilirubin: 0.9 mg/dL (ref <1.2)
Total Protein: 6.3 g/dL — ABNORMAL LOW (ref 6.5–8.1)

## 2023-04-13 MED ORDER — DICYCLOMINE HCL 10 MG PO CAPS
20.0000 mg | ORAL_CAPSULE | Freq: Once | ORAL | Status: AC
  Administered 2023-04-13: 20 mg via ORAL
  Filled 2023-04-13: qty 2

## 2023-04-13 NOTE — ED Provider Notes (Signed)
[place] EMERGENCY DEPARTMENT AT [facility] Provider Note  CSN: [id number] Arrival date & time: 04/13/23 1505  Chief Complaint(s) Abdominal Pain  HPI IZEYAH PEAL is a 67 y.o. male who presents emergency department for evaluation of abdominal pain, distention and constipation.  Patient has a long history of constipation and was seen on both 11/21 and 04/07/2023 for constipation where he received an enema at the [place]. I Unfortunately cannot see the details of these encounters through care everywhere.  He states that he previously was on opioid pain medication for many years but has not been on opioids for the last 8 months.  He endorses generalized abdominal pain, distention and cramping but denies nausea, vomiting, chest pain, shortness of breath or other systemic symptoms.   Past Medical History Past Medical History:  Diagnosis Date   Behavioral disorder    Cancer [facility])    Constipation    Gonorrhea    Patient Active Problem List   Diagnosis Date Noted   Constipation 02/02/2016   Acute UTI (urinary tract infection)    Paranoid schizophrenia (HCC) 02/01/2016   Home Medication(s) Prior to Admission medications   Medication Sig Start Date End Date Taking? Authorizing Provider  bisacodyl (DULCOLAX) 5 MG EC tablet Take 5 mg by mouth daily as needed for moderate constipation.    [provider]  Calcium Carb-Cholecalciferol (CALCIUM-VITAMIN D) 500-400 MG-UNIT TABS Take 1 tablet by mouth daily.    [provider]  cetirizine (ZYRTEC ALLERGY) 10 MG tablet Take 1 tablet (10 mg total) by mouth daily. 07/19/20   Rhys Martini, PA-C  cyclobenzaprine (FLEXERIL) 10 MG tablet Take 1 tablet (10 mg total) by mouth 2 (two) times daily as needed for muscle spasms. 02/27/21   Alvira Monday, MD  diclofenac (VOLTAREN) 50 MG EC tablet Take 1 tablet (50 mg total) by mouth 2 (two) times daily as needed (pain). 12/17/21   Zenia Resides, MD  diclofenac Sodium (VOLTAREN)  1 % GEL Apply 2 g topically 4 (four) times daily. 11/20/20   Valinda Hoar, NP  diphenhydrAMINE (BENADRYL) 25 mg capsule Take 25 mg by mouth at bedtime as needed for itching or sleep.    [provider]  furosemide (LASIX) 20 MG tablet Take 1 tablet (20 mg total) by mouth daily for 5 days. 01/20/23 01/25/23  Carroll Sage, PA-C  gabapentin (NEURONTIN) 300 MG capsule Take 1 capsule (300 mg total) by mouth 3 (three) times daily. 06/18/20   Wallis Bamberg, PA-C  haloperidol (HALDOL) 5 MG tablet Take 5 mg by mouth at bedtime.    [provider]  haloperidol decanoate (HALDOL DECANOATE) 50 MG/ML injection Inject 50 mg into the muscle every 28 (twenty-eight) days.    [provider]  hydroxypropyl methylcellulose / hypromellose (ISOPTO TEARS / GONIOVISC) 2.5 % ophthalmic solution Place 1 drop into both eyes 3 (three) times daily as needed for dry eyes. 08/17/21   Ernie Avena, MD  lactulose (CHRONULAC) 10 GM/15ML solution Take 20 g by mouth 3 (three) times a week.    [provider]  levofloxacin (LEVAQUIN) 500 MG tablet Take 1.5 tablets (750 mg total) by mouth daily. 05/28/22   Netta Corrigan, PA-C  mupirocin cream (BACTROBAN) 2 % Apply 1 Application topically 2 (two) times daily. 11/04/21   Ellsworth Lennox, PA-C  omeprazole (PRILOSEC) 20 MG capsule Take 40 mg by mouth 2 (two) times daily before a meal.    [provider]  oxybutynin (DITROPAN) 5 MG  tablet Take 5 mg by mouth 3 (three) times daily as needed for bladder spasms.    [provider]  oxyCODONE (ROXICODONE) 15 MG immediate release tablet Take 15 mg by mouth 3 (three) times daily as needed. 12/13/21   [provider]  phenazopyridine (PYRIDIUM) 200 MG tablet Take 1 tablet (200 mg total) by mouth 3 (three) times daily. 05/24/22   Defelice, Para March, NP  polyethylene glycol (MIRALAX / GLYCOLAX) packet Take 17 g by mouth 3 (three) times a week.    [provider]  sertraline (ZOLOFT)  50 MG tablet Take 50 mg by mouth daily.    [provider]  VIAGRA 100 MG tablet Take 100 mg by mouth as needed for erectile dysfunction.  01/24/16   [provider]                                                                                                                                    Past Surgical History Past Surgical History:  Procedure Laterality Date   BACK SURGERY     Family History Family History  Family history unknown: Yes    Social History Social History   Tobacco Use   Smoking status: Former    Current packs/day: 1.00    Types: Cigarettes   Smokeless tobacco: Never  Vaping Use   Vaping status: Never Used  Substance Use Topics   Alcohol use: No   Drug use: No   Allergies Penicillins  Review of Systems Review of Systems  Gastrointestinal:  Positive for abdominal distention, abdominal pain and constipation.    Physical Exam Vital Signs  I have reviewed the triage vital signs BP 130/63   Pulse 72   Temp (!) 97.3 F (36.3 C) (Oral)   Resp 16   Ht 6\' 2"  (1.88 m)   Wt 124.7 kg   SpO2 100%   BMI 35.31 kg/m   Physical Exam Constitutional:      General: He is not in acute distress.    Appearance: Normal appearance.  HENT:     Head: Normocephalic and atraumatic.     Nose: No congestion or rhinorrhea.  Eyes:     General:        Right eye: No discharge.        Left eye: No discharge.     Extraocular Movements: Extraocular movements intact.     Pupils: Pupils are equal, round, and reactive to light.  Cardiovascular:     Rate and Rhythm: Normal rate and regular rhythm.     Heart sounds: No murmur heard. Pulmonary:     Effort: No respiratory distress.     Breath sounds: No wheezing or rales.  Abdominal:     General: There is distension.     Tenderness: There is generalized abdominal tenderness.  Musculoskeletal:        General: Normal range of motion.     Cervical back: Normal  range of motion.  Skin:    General: Skin is  warm and dry.  Neurological:     General: No focal deficit present.     Mental Status: He is alert.     ED Results and Treatments Labs (all labs ordered are listed, but only abnormal results are displayed) Labs Reviewed  COMPREHENSIVE METABOLIC PANEL - Abnormal; Notable for the following components:      Result Value   Glucose, Bld 100 (*)    Total Protein 6.3 (*)    All other components within normal limits  CBC WITH DIFFERENTIAL/PLATELET                                                                                                                          Radiology CT ABDOMEN PELVIS WO CONTRAST  Result Date: 04/13/2023 CLINICAL DATA:  Bowel obstruction suspected. Constipation for 1 month. EXAM: CT ABDOMEN AND PELVIS WITHOUT CONTRAST TECHNIQUE: Multidetector CT imaging of the abdomen and pelvis was performed following the standard protocol without IV contrast. RADIATION DOSE REDUCTION: This exam was performed according to the departmental dose-optimization program which includes automated exposure control, adjustment of the mA and/or kV according to patient size and/or use of iterative reconstruction technique. COMPARISON:  02/26/2016 FINDINGS: Lower chest: Linear areas of scarring or atelectasis in the lung bases. No effusions. Hepatobiliary: Prior cholecystectomy. No suspicious focal hepatic abnormality or biliary ductal dilatation. Pancreas: No focal abnormality or ductal dilatation. Spleen: No focal abnormality.  Normal size. Adrenals/Urinary Tract: No adrenal abnormality. No focal renal abnormality. No stones or hydronephrosis. Urinary bladder is unremarkable. Stomach/Bowel: Tortuous colon. Scattered air-fluid levels within the colon. Large and small bowel decompressed, unremarkable. Appendix normal. No evidence of bowel obstruction. Vascular/Lymphatic: No evidence of aneurysm or adenopathy. Aortic atherosclerosis. Reproductive: Radiation seeds or surgical clips in the region of the  prostate. No visible focal abnormality. Other: No free fluid or free air. Musculoskeletal: No acute bony abnormality. IMPRESSION: Tortuous colon with scattered air-fluid levels but no excessive stool burden or evidence of bowel obstruction. Scattered aortic atherosclerosis. Bibasilar scarring or atelectasis. Electronically Signed   By: Charlett Nose M.D.   On: 04/13/2023 18:13    Pertinent labs & imaging results that were available during my care of the patient were reviewed by me and considered in my medical decision making (see MDM for details).  Medications Ordered in ED Medications  dicyclomine (BENTYL) capsule 20 mg (has no administration in time range)  Procedures Procedures  (including critical care time)  Medical Decision Making / ED Course   This patient presents to the ED for concern of abdominal pain, constipation, this involves an extensive number of treatment options, and is a complaint that carries with it a high risk of complications and morbidity.  The differential diagnosis includes obstruction, constipation, gastroenteritis, mass, abdominal bloating  MDM: Patient seen emerged part for evaluation of abdominal pain and patient concern for constipation.  Physical exam with abdominal distention and mild generalized abdominal tenderness.  Laboratory evaluation unremarkable.  CT abdomen pelvis with some scattered air-fluid levels but no obvious evidence of obstruction.  No constipation seen.  While in the CAT scan are, patient had a large liquid bowel movement.  Patient continuing to have liquid bowel movements here in the ED likely secondary to the patient's laxative use.  Patient is requesting oxycodone of which I informed him we will not be prescribing today.  Given Bentyl for crampy abdominal pain but at this time he does not meet inpatient criteria for  admission and will be discharged with outpatient follow-up.   Additional history obtained:  -External records from outside source obtained and reviewed including: Chart review including previous notes, labs, imaging, consultation notes   Lab Tests: -I ordered, reviewed, and interpreted labs.   The pertinent results include:   Labs Reviewed  COMPREHENSIVE METABOLIC PANEL - Abnormal; Notable for the following components:      Result Value   Glucose, Bld 100 (*)    Total Protein 6.3 (*)    All other components within normal limits  CBC WITH DIFFERENTIAL/PLATELET     Imaging Studies ordered: I ordered imaging studies including CT abdomen pelvis I independently visualized and interpreted imaging. I agree with the radiologist interpretation   Medicines ordered and prescription drug management: Meds ordered this encounter  Medications   dicyclomine (BENTYL) capsule 20 mg    -I have reviewed the patients home medicines and have made adjustments as needed  Critical interventions none    Cardiac Monitoring: The patient was maintained on a cardiac monitor.  I personally viewed and interpreted the cardiac monitored which showed an underlying rhythm of: NSR  Social Determinants of Health:  Factors impacting patients care include: none   Reevaluation: After the interventions noted above, I reevaluated the patient and found that they have :improved  Co morbidities that complicate the patient evaluation  Past Medical History:  Diagnosis Date   Behavioral disorder    Cancer (HCC)    Constipation    Gonorrhea       Dispostion: I considered admission for this patient, but at this time he does not meet inpatient criteria for admission and will be discharged with outpatient follow-up     Final Clinical Impression(s) / ED Diagnoses Final diagnoses:  Generalized abdominal pain     @PCDICTATION @    Kyani Simkin, Wyn Forster, MD 04/13/23 1843

## 2023-04-13 NOTE — ED Triage Notes (Signed)
Patient via EMS for eval of constipation x 1 month. Taking Miralax regularly without relief. Seen at [facility] on Thursday and given an enema with BM but otherwise has had only one bowel movement in the last month. Endorses abdominal pain. Denies n/v and is able to tolerate PO intake.

## 2023-05-23 ENCOUNTER — Emergency Department (HOSPITAL_COMMUNITY)
Admission: EM | Admit: 2023-05-23 | Discharge: 2023-05-23 | Disposition: A | Discharge status: Home / Self Care | Payer: No Typology Code available for payment source | Attending: Emergency Medicine | Admitting: Emergency Medicine

## 2023-05-23 ENCOUNTER — Emergency Department (HOSPITAL_COMMUNITY): Payer: No Typology Code available for payment source

## 2023-05-23 ENCOUNTER — Other Ambulatory Visit: Payer: Self-pay

## 2023-05-23 ENCOUNTER — Encounter (HOSPITAL_COMMUNITY): Payer: Self-pay

## 2023-05-23 DIAGNOSIS — W19XXXA Unspecified fall, initial encounter: Secondary | ICD-10-CM

## 2023-05-23 DIAGNOSIS — W182XXA Fall in (into) shower or empty bathtub, initial encounter: Secondary | ICD-10-CM | POA: Diagnosis not present

## 2023-05-23 DIAGNOSIS — S8002XA Contusion of left knee, initial encounter: Secondary | ICD-10-CM | POA: Insufficient documentation

## 2023-05-23 DIAGNOSIS — Z743 Need for continuous supervision: Secondary | ICD-10-CM | POA: Diagnosis not present

## 2023-05-23 DIAGNOSIS — Y92002 Bathroom of unspecified non-institutional (private) residence single-family (private) house as the place of occurrence of the external cause: Secondary | ICD-10-CM | POA: Diagnosis not present

## 2023-05-23 DIAGNOSIS — M25569 Pain in unspecified knee: Secondary | ICD-10-CM | POA: Diagnosis not present

## 2023-05-23 DIAGNOSIS — S80919A Unspecified superficial injury of unspecified knee, initial encounter: Secondary | ICD-10-CM | POA: Diagnosis not present

## 2023-05-23 DIAGNOSIS — S8992XA Unspecified injury of left lower leg, initial encounter: Secondary | ICD-10-CM | POA: Diagnosis present

## 2023-05-23 MED ORDER — OXYCODONE-ACETAMINOPHEN 5-325 MG PO TABS
1.0000 | ORAL_TABLET | Freq: Once | ORAL | Status: AC
  Administered 2023-05-23: 1 via ORAL
  Filled 2023-05-23: qty 1

## 2023-05-23 NOTE — ED Notes (Addendum)
 Patient walked out with walker before the discharge process could be completed. Cab called self pay per patient's request.

## 2023-05-23 NOTE — Discharge Instructions (Signed)
 The xray of your knee does not show any fractures, supporting a bruise type injury of the knee that will heal over time. Take ibuprofen and/or tylenol for pain.   Follow up with your doctor for recheck if soreness persists.

## 2023-05-23 NOTE — ED Provider Notes (Signed)
 [place] EMERGENCY DEPARTMENT AT [facility] Provider Note   CSN: [id number] Arrival date & time: 05/23/23  1344     History  Chief Complaint  Patient presents with   Timothy [name] is a 68 y.o. male.  Patient to ED after mechanical fall while getting out of the bathtub. He reports chronic bilateral shoulder pain that limits his mobility that could not hold his weight getting up, causing a fall onto the left knee. No other injury. Not anticoagulated.   The history is provided by the patient. No language interpreter was used.       Home Medications Prior to Admission medications   Medication Sig Start Date End Date Taking? Authorizing Provider  bisacodyl  (DULCOLAX) 5 MG EC tablet Take 5 mg by mouth daily as needed for moderate constipation.    [provider]  Calcium  Carb-Cholecalciferol  (CALCIUM -VITAMIN D ) 500-400 MG-UNIT TABS Take 1 tablet by mouth daily.    [provider]  cetirizine  (ZYRTEC  ALLERGY) 10 MG tablet Take 1 tablet (10 mg total) by mouth daily. 07/19/20   Graham, [name], PA-C  cyclobenzaprine  (FLEXERIL ) 10 MG tablet Take 1 tablet (10 mg total) by mouth 2 (two) times daily as needed for muscle spasms. 02/27/21   Dreama Longs, MD  diclofenac  (VOLTAREN ) 50 MG EC tablet Take 1 tablet (50 mg total) by mouth 2 (two) times daily as needed (pain). 12/17/21   Banister, [name], MD  diclofenac  Sodium (VOLTAREN ) 1 % GEL Apply 2 g topically 4 (four) times daily. 11/20/20   [name], NP  diphenhydrAMINE  (BENADRYL ) 25 mg capsule Take 25 mg by mouth at bedtime as needed for itching or sleep.    [provider]  furosemide  (LASIX ) 20 MG tablet Take 1 tablet (20 mg total) by mouth daily for 5 days. 01/20/23 01/25/23  [name], PA-C  gabapentin  (NEURONTIN ) 300 MG capsule Take 1 capsule (300 mg total) by mouth 3 (three) times daily. 06/18/20   [name], PA-C  haloperidol  (HALDOL ) 5 MG tablet Take 5 mg by mouth at  bedtime.    [provider]  haloperidol  decanoate (HALDOL  DECANOATE) 50 MG/ML injection Inject 50 mg into the muscle every 28 (twenty-eight) days.    [provider]  hydroxypropyl methylcellulose / hypromellose (ISOPTO TEARS / GONIOVISC) 2.5 % ophthalmic solution Place 1 drop into both eyes 3 (three) times daily as needed for dry eyes. 08/17/21   [name], MD  lactulose  (CHRONULAC ) 10 GM/15ML solution Take 20 g by mouth 3 (three) times a week.    [provider]  levofloxacin  (LEVAQUIN ) 500 MG tablet Take 1.5 tablets (750 mg total) by mouth daily. 05/28/22   [name], PA-C  mupirocin  cream (BACTROBAN ) 2 % Apply 1 Application topically 2 (two) times daily. 11/04/21   Agent Lenis, PA-C  omeprazole (PRILOSEC) 20 MG capsule Take 40 mg by mouth 2 (two) times daily before a meal.    [provider]  oxybutynin  (DITROPAN ) 5 MG tablet Take 5 mg by mouth 3 (three) times daily as needed for bladder spasms.    [provider]  oxyCODONE  (ROXICODONE ) 15 MG immediate release tablet Take 15 mg by mouth 3 (three) times daily as needed. 12/13/21   [provider]  phenazopyridine  (PYRIDIUM ) 200 MG tablet Take 1 tablet (200 mg total) by mouth 3 (three) times daily. 05/24/22   Defelice, [name], NP  polyethylene glycol (MIRALAX  / GLYCOLAX ) packet Take 17 g by  mouth 3 (three) times a week.    [provider]  sertraline  (ZOLOFT ) 50 MG tablet Take 50 mg by mouth daily.    [provider]  VIAGRA 100 MG tablet Take 100 mg by mouth as needed for erectile dysfunction.  01/24/16   [provider]      Allergies    Penicillins    Review of Systems   Review of Systems  Physical Exam Updated Vital Signs BP 130/82   Pulse 89   Temp 98.1 F (36.7 C)   Resp 17   SpO2 98%  Physical Exam Constitutional:      Appearance: He is well-developed.  Pulmonary:     Effort: Pulmonary effort is normal.  Musculoskeletal:         General: Normal range of motion.     Cervical back: Normal range of motion.     Comments: No swelling of the left knee. FROM. No lower leg or left hip tenderness.   Skin:    General: Skin is warm and dry.  Neurological:     Mental Status: He is alert and oriented to person, place, and time.     Sensory: No sensory deficit.     ED Results / Procedures / Treatments   Labs (all labs ordered are listed, but only abnormal results are displayed) Labs Reviewed - No data to display  EKG None  Radiology DG Knee Complete 4 Views Left Addendum Date: 05/23/2023 ADDENDUM REPORT: 05/23/2023 15:36 IMPRESSION: Degenerative changes.  No acute osseous abnormalities. Electronically Signed   By: Fonda Field M.D.   On: 05/23/2023 15:36   Result Date: 05/23/2023 CLINICAL DATA:  Fall. EXAM: LEFT KNEE - COMPLETE 4 VIEW COMPARISON:  None Available. FINDINGS: Medial and patellofemoral osteophyte formation consistent with osteoarthritis. No acute fracture, dislocation or subluxation no effusion. No osteolytic or osteoblastic changes. IMPRESSION: Negative. Electronically Signed: By: Fonda Field M.D. On: 05/23/2023 15:32    Procedures Procedures    Medications Ordered in ED Medications  oxyCODONE -acetaminophen  (PERCOCET/ROXICET) 5-325 MG per tablet 1 tablet (1 tablet Oral Given 05/23/23 1534)    ED Course/ Medical Decision Making/ A&P Clinical Course as of 05/23/23 1557  Sat May 23, 2023  1514 Mechanical fall onto left knee. No other injury.  Imaging pending.   [SU]  1514 Patient requests cortisone injections to bilateral shoulders due to pain. He reports he usually get the injections through the [place]. Discussed that joint injections are not provided in the ED and he will need to see his doctors at the [facility] for this treatment.  [SU]  1556 Xray negative for fracture. The patient states he has to have a wheelchair to get home or be transported. Here with walker, has wheelchair at home. Will inquire as  to help by EMS getting home, however, patient is aware they may not transport.  [SU]    Clinical Course User Index [SU] Odell Balls, PA-C                                 Medical Decision Making Amount and/or Complexity of Data Reviewed Radiology: ordered.           Final Clinical Impression(s) / ED Diagnoses Final diagnoses:  Fall, initial encounter  Contusion of left knee, initial encounter    Rx / DC Orders ED Discharge Orders     None         Odell Balls, PA-C  05/23/23 1557    HortonRoxie HERO, DO 05/23/23 2239

## 2023-05-23 NOTE — ED Triage Notes (Signed)
 Pt arrives via EMS from home. Pt reports he fell while getting out of the shower last night. Pt c/o pain to left knee. Pt AxOx4.

## 2023-06-17 ENCOUNTER — Encounter (HOSPITAL_COMMUNITY): Payer: Self-pay

## 2023-06-17 ENCOUNTER — Emergency Department (HOSPITAL_COMMUNITY)
Admission: EM | Admit: 2023-06-17 | Discharge: 2023-06-17 | Disposition: A | Discharge status: Home / Self Care | Payer: No Typology Code available for payment source | Attending: Emergency Medicine | Admitting: Emergency Medicine

## 2023-06-17 ENCOUNTER — Other Ambulatory Visit: Payer: Self-pay

## 2023-06-17 ENCOUNTER — Emergency Department (HOSPITAL_COMMUNITY)
Admission: EM | Admit: 2023-06-17 | Discharge: 2023-06-18 | Disposition: A | Discharge status: Home / Self Care | Payer: No Typology Code available for payment source | Attending: Emergency Medicine | Admitting: Emergency Medicine

## 2023-06-17 DIAGNOSIS — G8929 Other chronic pain: Secondary | ICD-10-CM | POA: Insufficient documentation

## 2023-06-17 DIAGNOSIS — M549 Dorsalgia, unspecified: Secondary | ICD-10-CM | POA: Diagnosis present

## 2023-06-17 DIAGNOSIS — M545 Low back pain, unspecified: Secondary | ICD-10-CM | POA: Diagnosis present

## 2023-06-17 HISTORY — DX: Post-traumatic stress disorder, unspecified: F43.10

## 2023-06-17 HISTORY — DX: Neuromuscular dysfunction of bladder, unspecified: N31.9

## 2023-06-17 HISTORY — DX: Other abnormalities of gait and mobility: R26.89

## 2023-06-17 HISTORY — DX: Paranoid schizophrenia: F20.0

## 2023-06-17 HISTORY — DX: Other chronic pain: G89.29

## 2023-06-17 HISTORY — DX: Other specified health status: Z78.9

## 2023-06-17 HISTORY — DX: Other reduced mobility: Z74.09

## 2023-06-17 LAB — URINALYSIS, W/ REFLEX TO CULTURE (INFECTION SUSPECTED)
Bilirubin Urine: NEGATIVE
Glucose, UA: NEGATIVE mg/dL
Ketones, ur: NEGATIVE mg/dL
Nitrite: POSITIVE — AB
Protein, ur: NEGATIVE mg/dL
Specific Gravity, Urine: 1.03 (ref 1.005–1.030)
pH: 5 (ref 5.0–8.0)

## 2023-06-17 LAB — CBC WITH DIFFERENTIAL/PLATELET
Abs Immature Granulocytes: 0.01 10*3/uL (ref 0.00–0.07)
Basophils Absolute: 0 10*3/uL (ref 0.0–0.1)
Basophils Relative: 1 %
Eosinophils Absolute: 0.4 10*3/uL (ref 0.0–0.5)
Eosinophils Relative: 7 %
HCT: 44.9 % (ref 39.0–52.0)
Hemoglobin: 15.2 g/dL (ref 13.0–17.0)
Immature Granulocytes: 0 %
Lymphocytes Relative: 21 %
Lymphs Abs: 1.1 10*3/uL (ref 0.7–4.0)
MCH: 30.7 pg (ref 26.0–34.0)
MCHC: 33.9 g/dL (ref 30.0–36.0)
MCV: 90.7 fL (ref 80.0–100.0)
Monocytes Absolute: 0.4 10*3/uL (ref 0.1–1.0)
Monocytes Relative: 8 %
Neutro Abs: 3.3 10*3/uL (ref 1.7–7.7)
Neutrophils Relative %: 63 %
Platelets: 292 10*3/uL (ref 150–400)
RBC: 4.95 MIL/uL (ref 4.22–5.81)
RDW: 13.2 % (ref 11.5–15.5)
WBC: 5.2 10*3/uL (ref 4.0–10.5)
nRBC: 0 % (ref 0.0–0.2)

## 2023-06-17 LAB — BASIC METABOLIC PANEL
Anion gap: 10 (ref 5–15)
BUN: 16 mg/dL (ref 8–23)
CO2: 26 mmol/L (ref 22–32)
Calcium: 8.9 mg/dL (ref 8.9–10.3)
Chloride: 102 mmol/L (ref 98–111)
Creatinine, Ser: 1.1 mg/dL (ref 0.61–1.24)
GFR, Estimated: >60 mL/min (ref >60)
Glucose, Bld: 101 mg/dL — ABNORMAL HIGH (ref 70–99)
Potassium: 3.9 mmol/L (ref 3.5–5.1)
Sodium: 138 mmol/L (ref 135–145)

## 2023-06-17 MED ORDER — ACETAMINOPHEN 500 MG PO TABS
1000.0000 mg | ORAL_TABLET | Freq: Once | ORAL | Status: AC
  Administered 2023-06-17: 1000 mg via ORAL
  Filled 2023-06-17: qty 2

## 2023-06-17 MED ORDER — CEPHALEXIN 500 MG PO CAPS: 500.0000 mg | ORAL_CAPSULE | Freq: Four times a day (QID) | ORAL | 0 refills | Status: DC

## 2023-06-17 MED ORDER — KETOROLAC TROMETHAMINE 15 MG/ML IJ SOLN
15.0000 mg | Freq: Once | INTRAMUSCULAR | Status: AC
  Administered 2023-06-17: 15 mg via INTRAMUSCULAR
  Filled 2023-06-17: qty 1

## 2023-06-17 MED ORDER — CEPHALEXIN 250 MG PO CAPS
500.0000 mg | ORAL_CAPSULE | Freq: Once | ORAL | Status: AC
  Administered 2023-06-17: 500 mg via ORAL
  Filled 2023-06-17: qty 2

## 2023-06-17 MED ORDER — OXYCODONE-ACETAMINOPHEN 5-325 MG PO TABS
1.0000 | ORAL_TABLET | Freq: Once | ORAL | Status: AC
  Administered 2023-06-17: 1 via ORAL
  Filled 2023-06-17: qty 1

## 2023-06-17 MED ORDER — OXYCODONE HCL 5 MG PO TABS
5.0000 mg | ORAL_TABLET | Freq: Once | ORAL | Status: AC
  Administered 2023-06-17: 5 mg via ORAL
  Filled 2023-06-17: qty 1

## 2023-06-17 NOTE — ED Triage Notes (Signed)
Pt is coming from home with back pain, chronic back pain but acutely hurting him today. States the pain started this morning. The pain is in his lower back and he is asking for perocs currently.  Medic vitals   80hr 18rr 142/86 95%ra 97.2

## 2023-06-17 NOTE — ED Notes (Addendum)
Pt called 911 again from his cell phone called this RN into room handed this RN his cell phone and stated "they want to talk to you", asked who was on the other line, pt responded 911. 911 operator advised pt called in reference in wanting a ride and asked if everything was okay, advised pt situation and that pt was at [facility] ED and is everything appears okay at this time. Explained to pt that he cannot be calling 911, that his phone will be at the desk until Sharin Mons has arrived to transport him back home    According to notes, Harlan Stains called [facility], he was 12th in line for transport as of 18:20 06/17/2023

## 2023-06-17 NOTE — Discharge Instructions (Signed)
Please begin taking Keflex 4 times a day for the next 10 days.  Please continue taking Tylenol at home for back pain.  Please follow-up with your PCP.  Return to the ED with any new or worsening symptoms.

## 2023-06-17 NOTE — ED Notes (Signed)
Pt received orange and cranberry juice along with a sandwich while he waits for his ride home, [facility] advised they should be back around to get pt within the hour.

## 2023-06-17 NOTE — ED Triage Notes (Signed)
Pt c/o back pain since 1980. Pt states he wants pain meds and a walker.

## 2023-06-17 NOTE — Discharge Instructions (Signed)

## 2023-06-17 NOTE — ED Notes (Signed)
Pt made a call to 911, pt was on cell phone with "[employer]" as witnessed by this RN during vitals assessment, then NT informed this RN that she was notified by Vickey Huger that pt had call 911 to injury about a ride home and his AKI, when NT asked about who he was calling pt reported to her "ghost busters". Pt was educated on not calling 911 as he is currently in the emergency department and there are security and one [employer] officer outside his room within the [facility] holding area as "purple zone".   Pt still currently waiting on [facility] to transport him back home

## 2023-06-17 NOTE — ED Notes (Signed)
Pt has repeatedly requested to be taken to outside to smoke a cigarette or weed, NT has explained to pt several times that staff cannot condole taking patients outside so they can participate in extracurricular activities that is against hospital policy.

## 2023-06-17 NOTE — ED Notes (Signed)
Pt stated to this tech while taking his vitals, "I wouldn't grab you in between the legs or anything like that" this tech told the pt that is a very inappropriate thing to say to me

## 2023-06-17 NOTE — ED Provider Notes (Signed)
[place] EMERGENCY DEPARTMENT AT [facility] Provider Note   CSN: [id number] Arrival date & time: 06/17/23  1132     History  Chief Complaint  Patient presents with   Back Pain    BERT PTACEK is a 68 y.o. male.  68 yo M with a chief complaint of low back pain.  Tells me has been going on for years.  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  Has indwelling Foley catheter.  Denies loss of bowel denies loss of rectal sensation denies numbness or weakness of the legs.  Denies recent trauma.  Denies fevers.  He does have some leg swelling.  He thinks is because he has been sleeping sitting up for a while.   Back Pain      Home Medications Prior to Admission medications   Medication Sig Start Date End Date Taking? Authorizing Provider  bisacodyl (DULCOLAX) 5 MG EC tablet Take 5 mg by mouth daily as needed for moderate constipation.    [provider]  Calcium Carb-Cholecalciferol (CALCIUM-VITAMIN D) 500-400 MG-UNIT TABS Take 1 tablet by mouth daily.    [provider]  cetirizine (ZYRTEC ALLERGY) 10 MG tablet Take 1 tablet (10 mg total) by mouth daily. 07/19/20   Rhys Martini, PA-C  cyclobenzaprine (FLEXERIL) 10 MG tablet Take 1 tablet (10 mg total) by mouth 2 (two) times daily as needed for muscle spasms. 02/27/21   Alvira Monday, MD  diclofenac (VOLTAREN) 50 MG EC tablet Take 1 tablet (50 mg total) by mouth 2 (two) times daily as needed (pain). 12/17/21   Zenia Resides, MD  diclofenac Sodium (VOLTAREN) 1 % GEL Apply 2 g topically 4 (four) times daily. 11/20/20   Valinda Hoar, NP  diphenhydrAMINE (BENADRYL) 25 mg capsule Take 25 mg by mouth at bedtime as needed for itching or sleep.    [provider]  furosemide (LASIX) 20 MG tablet Take 1 tablet (20 mg total) by mouth daily for 5 days. 01/20/23 01/25/23  Carroll Sage, PA-C  gabapentin (NEURONTIN) 300 MG capsule Take 1 capsule (300 mg total) by mouth 3 (three) times daily. 06/18/20   Wallis Bamberg, PA-C  haloperidol (HALDOL) 5 MG tablet Take 5 mg by mouth at bedtime.    [provider]  haloperidol decanoate (HALDOL DECANOATE) 50 MG/ML injection Inject 50 mg into the muscle every 28 (twenty-eight) days.    [provider]  hydroxypropyl methylcellulose / hypromellose (ISOPTO TEARS / GONIOVISC) 2.5 % ophthalmic solution Place 1 drop into both eyes 3 (three) times daily as needed for dry eyes. 08/17/21   Ernie Avena, MD  lactulose (CHRONULAC) 10 GM/15ML solution Take 20 g by mouth 3 (three) times a week.    [provider]  levofloxacin (LEVAQUIN) 500 MG tablet Take 1.5 tablets (750 mg total) by mouth daily. 05/28/22   Netta Corrigan, PA-C  mupirocin cream (BACTROBAN) 2 % Apply 1 Application topically 2 (two) times daily. 11/04/21   Ellsworth Lennox, PA-C  omeprazole (PRILOSEC) 20 MG capsule Take 40 mg by mouth 2 (two) times daily before a meal.    [provider]  oxybutynin (DITROPAN) 5 MG tablet Take 5 mg by mouth 3 (three) times daily as needed for bladder spasms.    [provider]  oxyCODONE (ROXICODONE) 15 MG immediate release tablet Take 15 mg by mouth 3 (three) times daily as needed. 12/13/21   [provider]  phenazopyridine (PYRIDIUM) 200 MG tablet Take 1 tablet (200  mg total) by mouth 3 (three) times daily. 05/24/22   Defelice, Para March, NP  polyethylene glycol (MIRALAX / GLYCOLAX) packet Take 17 g by mouth 3 (three) times a week.    [provider]  sertraline (ZOLOFT) 50 MG tablet Take 50 mg by mouth daily.    [provider]  VIAGRA 100 MG tablet Take 100 mg by mouth as needed for erectile dysfunction.  01/24/16   [provider]      Allergies    Penicillins    Review of Systems   Review of Systems  Musculoskeletal:  Positive for back pain.    Physical Exam Updated Vital Signs BP 115/77 (BP Location: Right Arm)   Pulse 83   Temp 98.4 F (36.9 C)   Resp 18   SpO2 100%  Physical  Exam Vitals and nursing note reviewed.  Constitutional:      Appearance: He is well-developed.  HENT:     Head: Normocephalic and atraumatic.  Eyes:     Pupils: Pupils are equal, round, and reactive to light.  Neck:     Vascular: No JVD.  Cardiovascular:     Rate and Rhythm: Normal rate and regular rhythm.     Heart sounds: No murmur heard.    No friction rub. No gallop.  Pulmonary:     Effort: No respiratory distress.     Breath sounds: No wheezing.  Abdominal:     General: There is no distension.     Tenderness: There is no abdominal tenderness. There is no guarding or rebound.  Musculoskeletal:        General: Normal range of motion.     Cervical back: Normal range of motion and neck supple.     Comments: No obvious midline spinal tenderness develops or deformities.  Pulse motor and sensation intact bilateral lower extremities.  Reflexes are diminished 1+ bilaterally.  No clonus.  Skin:    Coloration: Skin is not pale.     Findings: No rash.  Neurological:     Mental Status: He is alert and oriented to person, place, and time.  Psychiatric:        Behavior: Behavior normal.     ED Results / Procedures / Treatments   Labs (all labs ordered are listed, but only abnormal results are displayed) Labs Reviewed - No data to display  EKG None  Radiology No results found.  Procedures Procedures    Medications Ordered in ED Medications  acetaminophen (TYLENOL) tablet 1,000 mg (has no administration in time range)  ketorolac (TORADOL) 15 MG/ML injection 15 mg (has no administration in time range)  oxyCODONE (Oxy IR/ROXICODONE) immediate release tablet 5 mg (has no administration in time range)    ED Course/ Medical Decision Making/ A&P                                 Medical Decision Making Risk OTC drugs. Prescription drug management.   68 yo M with a chief complaints of low back pain.  This been ongoing issue for him.  Atraumatic.  Also was focused on  getting something to eat.  Will treat his back pain here.  He does have significant lower extremity edema that he thinks is due to sitting up.  No difficulty breathing.  Will have him follow-up with his family doctor in the office.  11:41 AM:  I have discussed the diagnosis/risks/treatment options with the patient.  Evaluation  and diagnostic testing in the emergency department does not suggest an emergent condition requiring admission or immediate intervention beyond what has been performed at this time.  They will follow up with PCP. We also discussed returning to the ED immediately if new or worsening sx occur. We discussed the sx which are most concerning (e.g., sudden worsening pain, fever, inability to tolerate by mouth, cauda equina s/sx) that necessitate immediate return. Medications administered to the patient during their visit and any new prescriptions provided to the patient are listed below.  Medications given during this visit Medications  acetaminophen (TYLENOL) tablet 1,000 mg (has no administration in time range)  ketorolac (TORADOL) 15 MG/ML injection 15 mg (has no administration in time range)  oxyCODONE (Oxy IR/ROXICODONE) immediate release tablet 5 mg (has no administration in time range)     The patient appears reasonably screen and/or stabilized for discharge and I doubt any other medical condition or other [facility] requiring further screening, evaluation, or treatment in the ED at this time prior to discharge.          Final Clinical Impression(s) / ED Diagnoses Final diagnoses:  Chronic bilateral low back pain without sciatica    Rx / DC Orders ED Discharge Orders     None         Melene Plan, DO 06/17/23 1141

## 2023-06-17 NOTE — ED Provider Notes (Signed)
[place] EMERGENCY DEPARTMENT AT [facility] Provider Note   CSN: [id number] Arrival date & time: 06/17/23  1248     History  Chief Complaint  Patient presents with   Back Pain    Timothy [name] is a 68 y.o. male with medical history of behavioral disorder, back surgery, chronic back pain.  The patient presents to the ED for evaluation of back pain.  The patient was just discharged 1145.  The patient is checked back in stating that he wants narcotic pain medication as well as a walker.  He denies any new features to his back pain.  States that he is still having pain and this is the reason he checked back in.  Denies any bowel or bladder incontinence, saddle anesthesia, distal weakness.  Denies dysuria but states that he self catheterizes.  Denies chest pain or shortness of breath.  Denies any recent trauma to his back to account for increased pain.  Denies fevers, nausea or vomiting.   Back Pain Associated symptoms: no chest pain, no dysuria and no fever        Home Medications Prior to Admission medications   Medication Sig Start Date End Date Taking? Authorizing Provider  cephALEXin (KEFLEX) 500 MG capsule Take 1 capsule (500 mg total) by mouth 4 (four) times daily. 06/17/23  Yes Al Decant, PA-C  bisacodyl (DULCOLAX) 5 MG EC tablet Take 5 mg by mouth daily as needed for moderate constipation.    [provider]  Calcium Carb-Cholecalciferol (CALCIUM-VITAMIN D) 500-400 MG-UNIT TABS Take 1 tablet by mouth daily.    [provider]  cetirizine (ZYRTEC ALLERGY) 10 MG tablet Take 1 tablet (10 mg total) by mouth daily. 07/19/20   Rhys Martini, PA-C  cyclobenzaprine (FLEXERIL) 10 MG tablet Take 1 tablet (10 mg total) by mouth 2 (two) times daily as needed for muscle spasms. 02/27/21   Alvira Monday, MD  diclofenac (VOLTAREN) 50 MG EC tablet Take 1 tablet (50 mg total) by mouth 2 (two) times daily as needed (pain). 12/17/21   Zenia Resides,  MD  diclofenac Sodium (VOLTAREN) 1 % GEL Apply 2 g topically 4 (four) times daily. 11/20/20   Valinda Hoar, NP  diphenhydrAMINE (BENADRYL) 25 mg capsule Take 25 mg by mouth at bedtime as needed for itching or sleep.    [provider]  furosemide (LASIX) 20 MG tablet Take 1 tablet (20 mg total) by mouth daily for 5 days. 01/20/23 01/25/23  Carroll Sage, PA-C  gabapentin (NEURONTIN) 300 MG capsule Take 1 capsule (300 mg total) by mouth 3 (three) times daily. 06/18/20   Wallis Bamberg, PA-C  haloperidol (HALDOL) 5 MG tablet Take 5 mg by mouth at bedtime.    [provider]  haloperidol decanoate (HALDOL DECANOATE) 50 MG/ML injection Inject 50 mg into the muscle every 28 (twenty-eight) days.    [provider]  hydroxypropyl methylcellulose / hypromellose (ISOPTO TEARS / GONIOVISC) 2.5 % ophthalmic solution Place 1 drop into both eyes 3 (three) times daily as needed for dry eyes. 08/17/21   Ernie Avena, MD  lactulose (CHRONULAC) 10 GM/15ML solution Take 20 g by mouth 3 (three) times a week.    [provider]  levofloxacin (LEVAQUIN) 500 MG tablet Take 1.5 tablets (750 mg total) by mouth daily. 05/28/22   Netta Corrigan, PA-C  mupirocin cream (BACTROBAN) 2 % Apply 1 Application topically 2 (two) times daily. 11/04/21   Ellsworth Lennox, PA-C  omeprazole [facility])  20 MG capsule Take 40 mg by mouth 2 (two) times daily before a meal.    [provider]  oxybutynin (DITROPAN) 5 MG tablet Take 5 mg by mouth 3 (three) times daily as needed for bladder spasms.    [provider]  oxyCODONE (ROXICODONE) 15 MG immediate release tablet Take 15 mg by mouth 3 (three) times daily as needed. 12/13/21   [provider]  phenazopyridine (PYRIDIUM) 200 MG tablet Take 1 tablet (200 mg total) by mouth 3 (three) times daily. 05/24/22   Defelice, Para March, NP  polyethylene glycol (MIRALAX / GLYCOLAX) packet Take 17 g by mouth 3 (three) times a week.    [provider]  sertraline (ZOLOFT) 50 MG tablet Take 50 mg by mouth daily.    [provider]  VIAGRA 100 MG tablet Take 100 mg by mouth as needed for erectile dysfunction.  01/24/16   [provider]      Allergies    Penicillins    Review of Systems   Review of Systems  Constitutional:  Negative for fever.  Respiratory:  Negative for shortness of breath.   Cardiovascular:  Negative for chest pain.  Gastrointestinal:  Negative for nausea and vomiting.  Genitourinary:  Negative for dysuria.  Musculoskeletal:  Positive for back pain.    Physical Exam Updated Vital Signs BP 130/74 (BP Location: Right Arm)   Pulse 70   Temp 97.6 F (36.4 C) (Oral)   Resp 16   SpO2 99%  Physical Exam Vitals and nursing note reviewed.  Constitutional:      General: He is not in acute distress.    Appearance: Normal appearance. He is not ill-appearing, toxic-appearing or diaphoretic.  HENT:     Head: Normocephalic and atraumatic.     Nose: Nose normal.     Mouth/Throat:     Mouth: Mucous membranes are moist.     Pharynx: Oropharynx is clear.  Eyes:     Extraocular Movements: Extraocular movements intact.     Conjunctiva/sclera: Conjunctivae normal.     Pupils: Pupils are equal, round, and reactive to light.  Cardiovascular:     Rate and Rhythm: Normal rate and regular rhythm.  Pulmonary:     Effort: Pulmonary effort is normal.     Breath sounds: Normal breath sounds. No wheezing.  Abdominal:     General: Abdomen is flat. Bowel sounds are normal.     Palpations: Abdomen is soft.     Tenderness: There is no abdominal tenderness.  Musculoskeletal:     Cervical back: Normal range of motion and neck supple. No tenderness.     Comments: No midline lumbar TTP.  5 out of 5 strength bilateral lower extremities.  Skin:    General: Skin is warm and dry.     Capillary Refill: Capillary refill takes less than 2 seconds.  Neurological:     Mental Status: He is alert and oriented  to person, place, and time.     Comments: 5/5 strength bilateral lower extremities     ED Results / Procedures / Treatments   Labs (all labs ordered are listed, but only abnormal results are displayed) Labs Reviewed  BASIC METABOLIC PANEL - Abnormal; Notable for the following components:      Result Value   Glucose, Bld 101 (*)    All other components within normal limits  URINALYSIS, W/ REFLEX TO CULTURE (INFECTION SUSPECTED) - Abnormal; Notable for the following components:   APPearance HAZY (*)  Hgb urine dipstick SMALL (*)    Nitrite POSITIVE (*)    Leukocytes,Ua SMALL (*)    Bacteria, UA MANY (*)    All other components within normal limits  URINE CULTURE  CBC WITH DIFFERENTIAL/PLATELET    EKG None  Radiology No results found.  Procedures Procedures   Medications Ordered in ED Medications  oxyCODONE-acetaminophen (PERCOCET/ROXICET) 5-325 MG per tablet 1 tablet (1 tablet Oral Given 06/17/23 1748)  cephALEXin (KEFLEX) capsule 500 mg (500 mg Oral Given 06/17/23 1748)    ED Course/ Medical Decision Making/ A&P  Medical Decision Making  68 year old male presents back to the ED after being discharged 11:45 AM.  Please see HPI for further details.  Patient complaining of ongoing chronic back pain.  This is the same complaint that he was seen for this morning.  The patient was discharged after receiving Toradol, Tylenol and oxycodone.  Patient returns requesting narcotic pain prescription.  Also requesting a walker per triage note however when I approached the patient and asked him about this he denies a need for a walker.  States he is here for Percocet prescription.  Patient has 5 out of 5 strength bilateral lower extremities.  Denies any red flag symptoms of low back pain.  No fevers, nausea vomiting, abdominal pain.  Patient workup significant for nitrite positive urine.  Have culture patient urine.  No leukocytosis or anemia.  Metabolic panel without electrolyte  derangement.  Patient we sent home with tentative Keflex.  Provided him a dose of Percocet here.  Advised him that I am unable to write narcotic pain management prescription to him.  Chart reviewed, the patient stated to a nurse tech that he "would not give the mouth this time".  I feel that providing the patient with narcotic pain medication is high risk so will defer.  Have advised him to follow-up with his family medicine doctor.  Stable to discharge home.   Final Clinical Impression(s) / ED Diagnoses Final diagnoses:  Chronic bilateral low back pain without sciatica    Rx / DC Orders ED Discharge Orders          Ordered    cephALEXin (KEFLEX) 500 MG capsule  4 times daily        06/17/23 1753              Al Decant, PA-C 06/17/23 1754    Anders Simmonds T, DO 06/18/23 1153

## 2023-06-17 NOTE — ED Provider Triage Note (Signed)
Emergency Medicine Provider Triage Evaluation Note  Timothy [name] , a 68 y.o. male  was evaluated in triage.  Pt complains of back pain.  Review of Systems  Positive:  Negative:   Physical Exam  There were no vitals taken for this visit. Gen:   Awake, no distress   Resp:  Normal effort  MSK:   Moves extremities without difficulty  Other:    Medical Decision Making  Medically screening exam initiated at 3:35 PM.  Appropriate orders placed.  [name] was informed that the remainder of the evaluation will be completed by another provider, this initial triage assessment does not replace that evaluation, and the importance of remaining in the ED until their evaluation is complete.  Concerned for chronic low back pain. Asking for something to drink. Was just discharged a couple hours ago. States that he came back here for "religion or politics". Was also asking for a walker. Also specifically asked for percocet for his low back pain stating "I wont share them this time. Last time I did it was to make ends meet."   Dorthy Cooler, [place] 06/17/23 1538

## 2023-06-18 ENCOUNTER — Encounter (HOSPITAL_COMMUNITY): Payer: Self-pay

## 2023-06-18 ENCOUNTER — Emergency Department (HOSPITAL_COMMUNITY)
Admission: EM | Admit: 2023-06-18 | Discharge: 2023-06-18 | Discharge status: Against Medical Advice | Payer: No Typology Code available for payment source | Attending: Emergency Medicine | Admitting: Emergency Medicine

## 2023-06-18 ENCOUNTER — Other Ambulatory Visit: Payer: Self-pay

## 2023-06-18 DIAGNOSIS — Z5321 Procedure and treatment not carried out due to patient leaving prior to being seen by health care provider: Secondary | ICD-10-CM | POA: Insufficient documentation

## 2023-06-18 DIAGNOSIS — M545 Low back pain, unspecified: Secondary | ICD-10-CM | POA: Insufficient documentation

## 2023-06-18 NOTE — ED Notes (Signed)
Pt assisted with removing his wet brief, this RN and NT Rachael placed clean brief on pt from his personal stock, pt able to stand with assistance using a walker that was provided for him, pt assisted to wheelchair given two warm blankets. Pt verbalized understanding of siting in [facility] lobby until he can find a suitable ride like a cab or uber to get him home, will keep attempting to call his brother who lives at home with him, pt inquired a bus pass, advised buses are not running at this time, if come the morning and he is still within the [employer], may ask for a bus pass if there are any to be given. Pt verbalized understanding, NT Rachael wheel pt to [facility] lobby accomplice by [employer].

## 2023-06-18 NOTE — ED Notes (Addendum)
Noted in pt's chart from [facility] visit on 05/23/2023    Candee Furbish, RN  Registered Nurse Nursing   ED Notes     Addendum   Date of Service: 05/23/2023  4:00 PM    Patient walked out with walker before the discharge process could be completed. Cab called self pay per patient's request.       [facility] advised they will cancel EMS call as pt is technically ambulatory with a walker and unable to provide himself with access to his home upon arrival therefore does not meet criteria for EMS transport. Charge RN Lowanda Foster is aware and okay with pt sitting in the [employer] and he can call a cab for himself or uber.

## 2023-06-18 NOTE — Discharge Planning (Signed)
RNCM notes consult for pt needing transportation and possible homeless resources; however, pt left prior to [facility] being able to talk to him.  Per notes pt has contacted a ride and was did not answer to his name being called in waiting area.  Please place another consult should pt revisit ED with needs.

## 2023-06-18 NOTE — ED Triage Notes (Signed)
Patient checked back in stating he is cold and needs to use the b athroom  Patient self caths.  Patient was on the phone with someone who asked if he needed a ride but patient said no and wont give RN the number.  Patients emergency contact is a friend who doesn't know why he is in his contact info.

## 2023-06-18 NOTE — ED Notes (Signed)
Name called during roll call in lobby w/ no response. Moved to [facility]

## 2023-06-18 NOTE — ED Notes (Addendum)
[facility] has arrived to transport pt back to his residence  Pt is currently self-cathing himself d/t neurogenic bladder  Cell phone given back to pt at this time, pt does not have a key to get in residence, attempting to call brother to see if he can unlock the door, however unable to get in contact with brother, called friend Kathlene November who is listed on pt's chart, Kathlene November has no way to get in contact with pt's brother and has not access to pt's house, EMS will not be able to transfer pt if pt is unable to get inside his home upon arrival.

## 2023-06-20 LAB — URINE CULTURE: Culture: >=100000 — AB

## 2023-06-21 ENCOUNTER — Telehealth (HOSPITAL_BASED_OUTPATIENT_CLINIC_OR_DEPARTMENT_OTHER): Payer: Self-pay | Admitting: *Deleted

## 2023-06-21 NOTE — Progress Notes (Signed)
ED Antimicrobial Stewardship Positive Culture Follow Up   Timothy [name] is an 68 y.o. male who presented to [facility] on @ADMITDT @ with a chief complaint of  Chief Complaint  Patient presents with   Back Pain    Recent Results (from the past 720 hours)  Urine Culture     Status: Abnormal   Collection Time: 06/17/23  4:00 PM   Specimen: Urine, Clean Catch  Result Value Ref Range Status   Specimen Description URINE, CLEAN CATCH  Final   Special Requests   Final    NONE Reflexed from [id number] Performed at [facility], [address] [place], [place] [address]    Culture (A)  Final    >=100,000 COLONIES/mL CITROBACTER FREUNDII 80,000 COLONIES/mL KLEBSIELLA PNEUMONIAE    Report Status 06/20/2023 FINAL  Final   Organism ID, Bacteria CITROBACTER FREUNDII (A)  Final   Organism ID, Bacteria KLEBSIELLA PNEUMONIAE (A)  Final      Susceptibility   Citrobacter freundii - MIC*    CEFEPIME <=0.12 SENSITIVE Sensitive     CEFTRIAXONE <=0.25 SENSITIVE Sensitive     CIPROFLOXACIN <=0.25 SENSITIVE Sensitive     GENTAMICIN >=16 RESISTANT Resistant     IMIPENEM 0.5 SENSITIVE Sensitive     NITROFURANTOIN <=16 SENSITIVE Sensitive     TRIMETH/SULFA >=320 RESISTANT Resistant     PIP/TAZO <=4 SENSITIVE Sensitive ug/mL    * >=100,000 COLONIES/mL CITROBACTER FREUNDII   Klebsiella pneumoniae - MIC*    AMPICILLIN >=32 RESISTANT Resistant     CEFAZOLIN <=4 SENSITIVE Sensitive     CEFEPIME <=0.12 SENSITIVE Sensitive     CEFTRIAXONE <=0.25 SENSITIVE Sensitive     CIPROFLOXACIN <=0.25 SENSITIVE Sensitive     GENTAMICIN <=1 SENSITIVE Sensitive     IMIPENEM 0.5 SENSITIVE Sensitive     NITROFURANTOIN 32 SENSITIVE Sensitive     TRIMETH/SULFA <=20 SENSITIVE Sensitive     AMPICILLIN/SULBACTAM 4 SENSITIVE Sensitive     PIP/TAZO <=4 SENSITIVE Sensitive ug/mL    * 80,000 COLONIES/mL KLEBSIELLA PNEUMONIAE    [x]  Treated with cephalexin, organism resistant to prescribed antimicrobial  STOP  Keflex START: Macrobid 100mg  BID x 7 days (Qty 14; Refills 0)  ED Provider: Carles Collet, PharmD, BCPS 06/21/2023 11:16 AM ED Clinical Pharmacist -  [phone]

## 2023-06-21 NOTE — Telephone Encounter (Signed)
Post ED Visit - Positive Culture Follow-up: Unsuccessful Patient Follow-up  Culture assessed and recommendations reviewed by:  [x]  Delmar Landau, Pharm.D. []  Celedonio Miyamoto, Pharm.D., BCPS AQ-ID []  Garvin Fila, Pharm.D., BCPS []  Georgina Pillion, Pharm.D., BCPS []  [facility], [address].D., BCPS, AAHIVP []  Estella Husk, Pharm.D., BCPS, AAHIVP []  Sherlynn Carbon, PharmD []  Pollyann Samples, PharmD, BCPS  Positive urine culture  []  Patient discharged without antimicrobial prescription and treatment is now indicated [x]  Organism is resistant to prescribed ED discharge antimicrobial []  Patient with positive blood cultures  Plan: Stop Keflex.  Start Macrobid 100mg  BID x 7 days (qty 14) per Gerhard Munch  Unable to contact patient, letter will be sent to address on file  Patsey Berthold 06/21/2023, 2:13 PM

## 2023-06-25 ENCOUNTER — Observation Stay (HOSPITAL_COMMUNITY): Payer: No Typology Code available for payment source

## 2023-06-25 ENCOUNTER — Other Ambulatory Visit: Payer: Self-pay

## 2023-06-25 ENCOUNTER — Emergency Department (HOSPITAL_COMMUNITY): Payer: No Typology Code available for payment source

## 2023-06-25 ENCOUNTER — Encounter (HOSPITAL_COMMUNITY): Payer: Self-pay | Admitting: Emergency Medicine

## 2023-06-25 ENCOUNTER — Inpatient Hospital Stay (HOSPITAL_COMMUNITY)
Admission: EM | Admit: 2023-06-25 | Discharge: 2023-07-22 | DRG: 493 | Disposition: A | Discharge status: Skilled Nursing Facility | Payer: No Typology Code available for payment source | Attending: Internal Medicine | Admitting: Internal Medicine

## 2023-06-25 ENCOUNTER — Emergency Department (HOSPITAL_BASED_OUTPATIENT_CLINIC_OR_DEPARTMENT_OTHER)
Admit: 2023-06-25 | Discharge: 2023-06-25 | Disposition: A | Discharge status: Home / Self Care | Payer: No Typology Code available for payment source | Attending: Emergency Medicine | Admitting: Emergency Medicine

## 2023-06-25 DIAGNOSIS — C7951 Secondary malignant neoplasm of bone: Secondary | ICD-10-CM | POA: Diagnosis present

## 2023-06-25 DIAGNOSIS — Z8546 Personal history of malignant neoplasm of prostate: Secondary | ICD-10-CM

## 2023-06-25 DIAGNOSIS — Z7982 Long term (current) use of aspirin: Secondary | ICD-10-CM

## 2023-06-25 DIAGNOSIS — Z79899 Other long term (current) drug therapy: Secondary | ICD-10-CM

## 2023-06-25 DIAGNOSIS — Z751 Person awaiting admission to adequate facility elsewhere: Secondary | ICD-10-CM

## 2023-06-25 DIAGNOSIS — R4182 Altered mental status, unspecified: Secondary | ICD-10-CM

## 2023-06-25 DIAGNOSIS — T434X6A Underdosing of butyrophenone and thiothixene neuroleptics, initial encounter: Secondary | ICD-10-CM | POA: Diagnosis present

## 2023-06-25 DIAGNOSIS — F200 Paranoid schizophrenia: Secondary | ICD-10-CM | POA: Diagnosis present

## 2023-06-25 DIAGNOSIS — N390 Urinary tract infection, site not specified: Principal | ICD-10-CM

## 2023-06-25 DIAGNOSIS — Z91148 Patient's other noncompliance with medication regimen for other reason: Secondary | ICD-10-CM

## 2023-06-25 DIAGNOSIS — T443X6A Underdosing of other parasympatholytics [anticholinergics and antimuscarinics] and spasmolytics, initial encounter: Secondary | ICD-10-CM | POA: Diagnosis present

## 2023-06-25 DIAGNOSIS — N319 Neuromuscular dysfunction of bladder, unspecified: Secondary | ICD-10-CM | POA: Diagnosis not present

## 2023-06-25 DIAGNOSIS — S9302XA Subluxation of left ankle joint, initial encounter: Secondary | ICD-10-CM | POA: Diagnosis present

## 2023-06-25 DIAGNOSIS — L03116 Cellulitis of left lower limb: Secondary | ICD-10-CM | POA: Diagnosis present

## 2023-06-25 DIAGNOSIS — S82852A Displaced trimalleolar fracture of left lower leg, initial encounter for closed fracture: Principal | ICD-10-CM | POA: Diagnosis present

## 2023-06-25 DIAGNOSIS — Z6835 Body mass index (BMI) 35.0-35.9, adult: Secondary | ICD-10-CM

## 2023-06-25 DIAGNOSIS — T43226A Underdosing of selective serotonin reuptake inhibitors, initial encounter: Secondary | ICD-10-CM | POA: Diagnosis present

## 2023-06-25 DIAGNOSIS — R159 Full incontinence of feces: Secondary | ICD-10-CM | POA: Diagnosis not present

## 2023-06-25 DIAGNOSIS — Z66 Do not resuscitate: Secondary | ICD-10-CM | POA: Diagnosis present

## 2023-06-25 DIAGNOSIS — I10 Essential (primary) hypertension: Secondary | ICD-10-CM | POA: Diagnosis present

## 2023-06-25 DIAGNOSIS — M549 Dorsalgia, unspecified: Secondary | ICD-10-CM | POA: Insufficient documentation

## 2023-06-25 DIAGNOSIS — E876 Hypokalemia: Secondary | ICD-10-CM | POA: Diagnosis present

## 2023-06-25 DIAGNOSIS — Z789 Other specified health status: Secondary | ICD-10-CM

## 2023-06-25 DIAGNOSIS — M7989 Other specified soft tissue disorders: Secondary | ICD-10-CM | POA: Diagnosis not present

## 2023-06-25 DIAGNOSIS — F17290 Nicotine dependence, other tobacco product, uncomplicated: Secondary | ICD-10-CM | POA: Diagnosis present

## 2023-06-25 DIAGNOSIS — E66812 Obesity, class 2: Secondary | ICD-10-CM | POA: Diagnosis present

## 2023-06-25 DIAGNOSIS — F209 Schizophrenia, unspecified: Secondary | ICD-10-CM

## 2023-06-25 DIAGNOSIS — F4310 Post-traumatic stress disorder, unspecified: Secondary | ICD-10-CM | POA: Diagnosis present

## 2023-06-25 DIAGNOSIS — F32A Depression, unspecified: Secondary | ICD-10-CM | POA: Diagnosis present

## 2023-06-25 DIAGNOSIS — Z880 Allergy status to penicillin: Secondary | ICD-10-CM

## 2023-06-25 DIAGNOSIS — Z993 Dependence on wheelchair: Secondary | ICD-10-CM

## 2023-06-25 DIAGNOSIS — W19XXXA Unspecified fall, initial encounter: Secondary | ICD-10-CM | POA: Diagnosis present

## 2023-06-25 LAB — COMPREHENSIVE METABOLIC PANEL
ALT: 24 U/L (ref 0–44)
AST: 39 U/L (ref 15–41)
Albumin: 3 g/dL — ABNORMAL LOW (ref 3.5–5.0)
Alkaline Phosphatase: 53 U/L (ref 38–126)
Anion gap: 12 (ref 5–15)
BUN: 10 mg/dL (ref 8–23)
CO2: 24 mmol/L (ref 22–32)
Calcium: 8.5 mg/dL — ABNORMAL LOW (ref 8.9–10.3)
Chloride: 103 mmol/L (ref 98–111)
Creatinine, Ser: 1.02 mg/dL (ref 0.61–1.24)
GFR, Estimated: >60 mL/min (ref >60)
Glucose, Bld: 104 mg/dL — ABNORMAL HIGH (ref 70–99)
Potassium: 3.6 mmol/L (ref 3.5–5.1)
Sodium: 139 mmol/L (ref 135–145)
Total Bilirubin: 0.8 mg/dL (ref 0.0–1.2)
Total Protein: 6.4 g/dL — ABNORMAL LOW (ref 6.5–8.1)

## 2023-06-25 LAB — RAPID URINE DRUG SCREEN, HOSP PERFORMED
Amphetamines: NOT DETECTED
Barbiturates: NOT DETECTED
Benzodiazepines: NOT DETECTED
Cocaine: NOT DETECTED
Opiates: NOT DETECTED
Tetrahydrocannabinol: NOT DETECTED

## 2023-06-25 LAB — URINALYSIS, W/ REFLEX TO CULTURE (INFECTION SUSPECTED)
Bilirubin Urine: NEGATIVE
Glucose, UA: NEGATIVE mg/dL
Ketones, ur: 40 mg/dL — AB
Nitrite: POSITIVE — AB
Protein, ur: NEGATIVE mg/dL
Specific Gravity, Urine: 1.02 (ref 1.005–1.030)
pH: 6 (ref 5.0–8.0)

## 2023-06-25 LAB — CBC WITH DIFFERENTIAL/PLATELET
Abs Immature Granulocytes: 0.02 10*3/uL (ref 0.00–0.07)
Basophils Absolute: 0 10*3/uL (ref 0.0–0.1)
Basophils Relative: 0 %
Eosinophils Absolute: 0 10*3/uL (ref 0.0–0.5)
Eosinophils Relative: 0 %
HCT: 42.1 % (ref 39.0–52.0)
Hemoglobin: 14.2 g/dL (ref 13.0–17.0)
Immature Granulocytes: 0 %
Lymphocytes Relative: 12 %
Lymphs Abs: 0.7 10*3/uL (ref 0.7–4.0)
MCH: 29.9 pg (ref 26.0–34.0)
MCHC: 33.7 g/dL (ref 30.0–36.0)
MCV: 88.6 fL (ref 80.0–100.0)
Monocytes Absolute: 0.3 10*3/uL (ref 0.1–1.0)
Monocytes Relative: 5 %
Neutro Abs: 4.9 10*3/uL (ref 1.7–7.7)
Neutrophils Relative %: 83 %
Platelets: 189 10*3/uL (ref 150–400)
RBC: 4.75 MIL/uL (ref 4.22–5.81)
RDW: 12.6 % (ref 11.5–15.5)
WBC: 6 10*3/uL (ref 4.0–10.5)
nRBC: 0 % (ref 0.0–0.2)

## 2023-06-25 LAB — PROTIME-INR
INR: 1 (ref 0.8–1.2)
Prothrombin Time: 13.8 s (ref 11.4–15.2)

## 2023-06-25 LAB — BRAIN NATRIURETIC PEPTIDE: B Natriuretic Peptide: 66.8 pg/mL (ref 0.0–100.0)

## 2023-06-25 LAB — I-STAT CG4 LACTIC ACID, ED: Lactic Acid, Venous: 1.1 mmol/L (ref 0.5–1.9)

## 2023-06-25 LAB — ETHANOL: Alcohol, Ethyl (B): <10 mg/dL (ref <10)

## 2023-06-25 MED ORDER — MELATONIN 5 MG PO TABS
10.0000 mg | ORAL_TABLET | Freq: Every evening | ORAL | Status: DC | PRN
  Administered 2023-06-25 – 2023-07-21 (×13): 10 mg via ORAL
  Filled 2023-06-25 (×13): qty 2

## 2023-06-25 MED ORDER — ONDANSETRON HCL 4 MG/2ML IJ SOLN: 4.0000 mg | Freq: Four times a day (QID) | INTRAMUSCULAR | Status: DC | PRN

## 2023-06-25 MED ORDER — LABETALOL HCL 5 MG/ML IV SOLN: 10.0000 mg | INTRAVENOUS | Status: DC | PRN

## 2023-06-25 MED ORDER — HEPARIN SODIUM (PORCINE) 5000 UNIT/ML IJ SOLN
5000.0000 [IU] | Freq: Three times a day (TID) | INTRAMUSCULAR | Status: DC
  Administered 2023-06-26 – 2023-06-30 (×14): 5000 [IU] via SUBCUTANEOUS
  Filled 2023-06-25 (×14): qty 1

## 2023-06-25 MED ORDER — SODIUM CHLORIDE 0.9 % IV SOLN
1.0000 g | Freq: Once | INTRAVENOUS | Status: AC
  Administered 2023-06-25: 1 g via INTRAVENOUS
  Filled 2023-06-25: qty 10

## 2023-06-25 MED ORDER — ALUM & MAG HYDROXIDE-SIMETH 200-200-20 MG/5ML PO SUSP: 30.0000 mL | ORAL | Status: DC | PRN

## 2023-06-25 MED ORDER — OXYCODONE HCL 5 MG PO TABS: 10.0000 mg | ORAL_TABLET | Freq: Once | ORAL | Status: DC

## 2023-06-25 MED ORDER — OXYCODONE HCL 5 MG PO TABS
10.0000 mg | ORAL_TABLET | ORAL | Status: DC | PRN
  Administered 2023-06-25 – 2023-06-29 (×9): 10 mg via ORAL
  Filled 2023-06-25 (×9): qty 2

## 2023-06-25 MED ORDER — ACETAMINOPHEN 650 MG RE SUPP
650.0000 mg | Freq: Four times a day (QID) | RECTAL | Status: DC | PRN
Start: 2023-06-25 — End: 2023-07-01

## 2023-06-25 MED ORDER — ACETAMINOPHEN 325 MG PO TABS
650.0000 mg | ORAL_TABLET | Freq: Four times a day (QID) | ORAL | Status: DC | PRN
  Administered 2023-06-26 – 2023-07-01 (×3): 650 mg via ORAL
  Filled 2023-06-25 (×3): qty 2

## 2023-06-25 MED ORDER — PROPOFOL 10 MG/ML IV BOLUS
0.5000 mg/kg | Freq: Once | INTRAVENOUS | Status: AC
  Administered 2023-06-25: 62 mg via INTRAVENOUS
  Filled 2023-06-25: qty 20

## 2023-06-25 MED ORDER — ONDANSETRON HCL 4 MG PO TABS: 4.0000 mg | ORAL_TABLET | Freq: Four times a day (QID) | ORAL | Status: DC | PRN

## 2023-06-25 MED ORDER — KETAMINE HCL 50 MG/5ML IJ SOSY
60.0000 mg | PREFILLED_SYRINGE | Freq: Once | INTRAMUSCULAR | Status: AC
  Administered 2023-06-25: 60 mg via INTRAVENOUS
  Filled 2023-06-25: qty 10

## 2023-06-25 NOTE — ED Notes (Signed)
 Pt removed male pure wick. Condom catheter is placed on pt now.

## 2023-06-25 NOTE — ED Provider Triage Note (Signed)
 Emergency Medicine Provider Triage Evaluation Note  Timothy [name] , a 68 y.o. male  was evaluated in triage.  Pt complains of pain all over.  States his sister dropped him off here.   Review of Systems  Positive:  Negative:   Physical Exam  BP 98/68   Pulse (!) 103   Temp 98.1 F (36.7 C) (Oral)   Resp 20   Wt 124 kg   SpO2 95%   BMI 35.10 kg/m  Gen:   Awake, no distress   Resp:  Normal effort  MSK:   Left lower extremity with erythema warmth and 2+ pitting edema, there is ecchymosis surrounding the ankle, he does not mobilize his ankle, cap refills less than 2 secs, DP pulses faint given edema Other: Neuro no facial droop, no aphasia, EOMI, PERRL, he is focused on stories from from many years ago, legs are apparently chronically injured  Chart review demonstrates he does have schizophrenia and presents very similar to numerous prior ED visits.  It appears that his mental status is at his baseline at this time.  Medical Decision Making  Medically screening exam initiated at 2:53 PM.  Appropriate orders placed.  Timothy [name] was informed that the remainder of the evaluation will be completed by another provider, this initial triage assessment does not replace that evaluation, and the importance of remaining in the ED until their evaluation is complete.     Donnajean Lynwood DEL, PA-C 06/25/23 1511

## 2023-06-25 NOTE — ED Provider Notes (Signed)
 .Sedation  Date/Time: 06/25/2023 6:52 PM  Performed by: Cottie Donnice PARAS, MD Authorized by: Cottie Donnice PARAS, MD   Consent:    Consent obtained:  Emergent situation   Consent given by:  Patient   Risks discussed:  Allergic reaction, dysrhythmia, inadequate sedation, nausea, vomiting, respiratory compromise necessitating ventilatory assistance and intubation, prolonged sedation necessitating reversal and prolonged hypoxia resulting in organ damage Universal protocol:    Procedure explained and questions answered to patient or proxy's satisfaction: yes     Relevant documents present and verified: yes     Test results available: yes     Imaging studies available: yes     Required blood products, implants, devices, and special equipment available: yes     Site/side marked: yes     Immediately prior to procedure, a time out was called: yes     Patient identity confirmed:  Arm band Indications:    Procedure performed:  Dislocation reduction   Procedure necessitating sedation performed by:  Physician performing sedation Pre-sedation assessment:    Time since last food or drink:  6   ASA classification: class 3 - patient with severe systemic disease     Mallampati score:  III - soft palate, base of uvula visible   Pre-sedation assessments completed and reviewed: airway patency, cardiovascular function, hydration status, mental status, nausea/vomiting, pain level, respiratory function and temperature   A pre-sedation assessment was completed prior to the start of the procedure Immediate pre-procedure details:    Reassessment: Patient reassessed immediately prior to procedure     Reviewed: vital signs, relevant labs/tests and NPO status     Verified: bag valve mask available, emergency equipment available, intubation equipment available, IV patency confirmed, oxygen  available and reversal medications available   Procedure details (see MAR for exact dosages):    Preoxygenation:  Nasal cannula    Sedation:  Propofol  and ketamine    Intended level of sedation: deep   Analgesia:  None   Intra-procedure monitoring:  Blood pressure monitoring, cardiac monitor, continuous capnometry, continuous pulse oximetry, frequent LOC assessments and frequent vital sign checks   Intra-procedure events: none     Intra-procedure management:  Airway repositioning   Total Provider sedation time (minutes):  30 Post-procedure details:   A post-sedation assessment was completed following the completion of the procedure.   Attendance: Constant attendance by certified staff until patient recovered     Recovery: Patient returned to pre-procedure baseline     Post-sedation assessments completed and reviewed: airway patency, cardiovascular function, hydration status, mental status, nausea/vomiting, pain level, respiratory function and temperature     Patient is stable for discharge or admission: yes     Procedure completion:  Tolerated well, no immediate complications .Reduction of fracture  Date/Time: 06/25/2023 6:53 PM  Performed by: Cottie Donnice PARAS, MD Authorized by: Cottie Donnice PARAS, MD  Consent: The procedure was performed in an emergent situation. Risks and benefits: risks, benefits and alternatives were discussed Consent given by: patient Patient understanding: patient states understanding of the procedure being performed Patient consent: the patient's understanding of the procedure matches consent given Procedure consent: procedure consent matches procedure scheduled Relevant documents: relevant documents present and verified Test results: test results available and properly labeled Site marked: the operative site was marked Imaging studies: imaging studies available Required items: required blood products, implants, devices, and special equipment available Patient identity confirmed: hospital-assigned identification number Time out: Immediately prior to procedure a time out was called to verify  the correct patient, procedure, equipment,  support staff and site/side marked as required. Preparation: Patient was prepped and draped in the usual sterile fashion. Local anesthesia used: no  Anesthesia: Local anesthesia used: no  Sedation: Patient sedated: yes Sedation type: moderate (conscious) sedation Sedatives: ketamine  and propofol  Sedation start date/time: 06/25/2023 6:30 PM Sedation end date/time: 06/25/2023 6:54 PM Vitals: Vital signs were monitored during sedation.  Patient tolerance: patient tolerated the procedure well with no immediate complications       Cottie Donnice PARAS, MD 06/25/23 [phone]

## 2023-06-25 NOTE — Assessment & Plan Note (Addendum)
 06-25-2023 Patient chronically self catheterizes every 3-4 hours.  He is urinating normally now.  I think his bladder is likely chronically contaminated and colonized with bacteria.  He has no systemic signs of infection.  Will hold on any further antibiotic therapy for now. 07-10-2023 stable. Has external urinary catheter. 07-11-2023 stable.  07-12-2023 stable.

## 2023-06-25 NOTE — Assessment & Plan Note (Addendum)
 06-25-2023 Patient has a history of schizophrenia per the EMR.  He gets all of his care at the [facility].  Will need the pharmacy technician to perform a medicine reconciliation after speaking with the [facility] pharmacy tomorrow. 06-26-2023 through 07-09-2023  psych consulted on 07-05-2023 due to increasingly disorganized speech, circumstantial and tangential thought process. Pt received IM haldol  deconate 50 mg on 07-03-2023 and 07-06-2023. Pt is suppose to be on 100 mg IM qmonthly.   07-10-2023 Next 100 mg IM haldol  deconate dose on 08-03-2023. Will go ahead and schedule this dose in case he is still in the hospital next month. 07-11-2023 stable. No psych contraindications to discharge.  07-12-2023 stable for DC per psych. IM dose 100 mg of Haldol  deconate ordered for 08-03-2023.

## 2023-06-25 NOTE — Hospital Course (Addendum)
 68 year old African-American male history of schizophrenia, chronic neurogenic bladder with a history of self catheterizations, chronic fecal incontinence, history of chronic back pain, follows almost exclusively at  [facility] presented for bilateral leg swelling and admitted on 06/25/2023 for left trimalleolar fracture underwent ORIF on 2/12 and hospitalization complicated by psychiatry history including paranoid behavior loss of insight seen by psychiatry-being managed with psychotropic medication including mostly Haldol  injection, TOC following for placement at [facility].  Significant Imaging Studies: Left ankle XR Acute mildly displaced distal fibular shaft fracture. Acute mildly displaced medial malleolar fracture. Mild posterior subluxation of the talar dome with respect to the distal tibia. CXR No active cardiopulmonary disease. Question nodular opacity at the posterior lung base on 1 of the 2 lateral images. Consider further assessment with chest CT CT head No acute intracranial process. 2. Paranasal sinus disease.  Procedures: 06-25-2023 left ankle fracture reduction 07-01-2023 ORIF left ankle fracture  Consultants: [facility]  ---

## 2023-06-25 NOTE — Assessment & Plan Note (Addendum)
 06-25-2023 Patient has a history of neurogenic bladder.  He uses self-catheterization to empty his bladder. 07-10-2023 stable. Has external urinary catheter.  07-11-2023 stable.  07-12-2023 stable

## 2023-06-25 NOTE — H&P (Signed)
 History and Physical    Timothy [name] FMW:[id number] DOB: Oct 09, 1955 DOA: 06/25/2023  DOS: the patient was seen and examined on 06/25/2023  PCP: [name], MD   Patient coming from: Home  I have personally briefly reviewed patient's old medical records in [facility]  HPI: 68 year old African-American male history of schizophrenia, chronic neurogenic bladder with a history of self catheterizations, chronic fecal incontinence, history of chronic back pain, follows almost exclusively at the [facility] [place] presents to the ER today for bilateral leg swelling.  He was seen in the ER at the end of January.  He was given a course of Keflex  which he never picked up.  Patient was noted to have a deformed  left lower leg.  X-rays showed a left trimalleolar fracture.  This was reduced after conscious sedation by the ER.  Orthopedics Dr. Sherida consulted by the EDP.  [facility] asked to admit the patient.  Patient states that he does not recall how he injured his leg.  He states that he has been using his leg a lot.  He is unclear about their circumstances on how he injured his leg.  He does know that is February 2025.  He knows that he is in the hospital.  On arrival temp 98.1 heart rate 103 blood pressure 90/68.  White count 6.0, hemoglobin 14.2, platelets 189  Sodium 139, potassium 3.6, chloride 103, bicarb 24, BUN of 10, creatinine 1.02, glucose 104  Calcium  8.5, total protein 6.4, albumin  3.0  AST 39, ALT 24, alk phos 53, total 0.8  Lactic acid normal at 1.1  Urine drug screen negative Ethyl alcohol  less than 10  Cath UA showed positive nitrates, leukocyte esterase, bacteria many  BNP normal at 66.8  Significant Events: Admitted 06/25/2023 for left trimalleolar fracture   Significant Labs: Sodium 139, potassium 3.6, chloride 103, bicarb 24, BUN of 10, creatinine 1.02, glucose 104 Calcium  8.5, total protein 6.4, albumin  3.0 AST 39, ALT 24, alk phos 53, total  0.8 Lactic acid normal at 1.1 Urine drug screen negative Ethyl alcohol  less than 10 Cath UA showed positive nitrates, leukocyte esterase, bacteria many BNP normal at 66.8  Significant Imaging Studies: Left ankle XR Acute mildly displaced distal fibular shaft fracture. Acute mildly displaced medial malleolar fracture. Mild posterior subluxation of the talar dome with respect to the distal tibia. CXR No active cardiopulmonary disease. Question nodular opacity at the posterior lung base on 1 of the 2 lateral images. Consider further assessment with chest CT CT head No acute intracranial process. 2. Paranasal sinus disease.  Antibiotic Therapy: Anti-infectives (From admission, onward)    Start     Dose/Rate Route Frequency Ordered Stop   06/25/23 1630  cefTRIAXone  (ROCEPHIN ) 1 g in sodium chloride  0.9 % 100 mL IVPB        1 g 200 mL/hr over 30 Minutes Intravenous  Once 06/25/23 1621 06/25/23 1827       Procedures: 06-25-2023 left ankle fracture reduction  Consultants: Ortho GLENWOOD Sherida   ED Course: Left ankle reduced by ER provider.  Orthopedics consulted.  Review of Systems:  Review of Systems  Constitutional: Negative.  Negative for chills and fever.  HENT: Negative.    Eyes: Negative.   Respiratory: Negative.    Cardiovascular: Negative.   Gastrointestinal: Negative.        Chronic fecal incontinence  Genitourinary:        Self cath 6-7 times a day  Musculoskeletal:  Positive for back pain and  joint pain.       Left ankle pain  Chronic back pain  Skin: Negative.   Neurological: Negative.        Uses a walker at home.  Endo/Heme/Allergies: Negative.   Psychiatric/Behavioral: Negative.    All other systems reviewed and are negative.   Past Medical History:  Diagnosis Date   Behavioral disorder    Cancer [facility])    prostate cancer & cancer in his spine   Chronic back pain    Constipation    Decreased mobility    uses wheelchair, spinal cancer   Gonorrhea     Impaired mobility    prostate cancer cancer in his spine led to surgery and some decrease use of his lower extremities, neurogenic bladder   Intermittent self-catheterization of bladder    Neurogenic bladder    Paranoid schizophrenia (HCC)    PTSD (post-traumatic stress disorder)     Past Surgical History:  Procedure Laterality Date   BACK SURGERY       reports that he has quit smoking. His smoking use included cigarettes. He has never used smokeless tobacco. He reports that he does not drink alcohol  and does not use drugs.  Allergies  Allergen Reactions   Penicillins Anaphylaxis    Has patient had a PCN reaction causing immediate rash, facial/tongue/throat swelling, SOB or lightheadedness with hypotension: Yes Has patient had a PCN reaction causing severe rash involving mucus membranes or skin necrosis: No Has patient had a PCN reaction that required hospitalization: Yes Has patient had a PCN reaction occurring within the last 10 years: No If all of the above answers are NO, then may proceed with Cephalosporin use.     Family History  Family history unknown: Yes    Prior to Admission medications   Medication Sig Start Date End Date Taking? Authorizing Provider  bisacodyl  (DULCOLAX) 5 MG EC tablet Take 5 mg by mouth daily as needed for moderate constipation.    [provider]  Calcium  Carb-Cholecalciferol  (CALCIUM -VITAMIN D ) 500-400 MG-UNIT TABS Take 1 tablet by mouth daily.    [provider]  cephALEXin  (KEFLEX ) 500 MG capsule Take 1 capsule (500 mg total) by mouth 4 (four) times daily. 06/17/23   [name], PA-C  cetirizine  (ZYRTEC  ALLERGY) 10 MG tablet Take 1 tablet (10 mg total) by mouth daily. 07/19/20   Graham, [name], PA-C  cyclobenzaprine  (FLEXERIL ) 10 MG tablet Take 1 tablet (10 mg total) by mouth 2 (two) times daily as needed for muscle spasms. 02/27/21   Dreama Longs, MD  diclofenac  (VOLTAREN ) 50 MG EC tablet Take 1 tablet (50 mg  total) by mouth 2 (two) times daily as needed (pain). 12/17/21   Banister, [name], MD  diclofenac  Sodium (VOLTAREN ) 1 % GEL Apply 2 g topically 4 (four) times daily. 11/20/20   [name], NP  diphenhydrAMINE  (BENADRYL ) 25 mg capsule Take 25 mg by mouth at bedtime as needed for itching or sleep.    [provider]  furosemide  (LASIX ) 20 MG tablet Take 1 tablet (20 mg total) by mouth daily for 5 days. 01/20/23 01/25/23  [name], PA-C  gabapentin  (NEURONTIN ) 300 MG capsule Take 1 capsule (300 mg total) by mouth 3 (three) times daily. 06/18/20   [name], PA-C  haloperidol  (HALDOL ) 5 MG tablet Take 5 mg by mouth at bedtime.    [provider]  haloperidol  decanoate (HALDOL  DECANOATE) 50 MG/ML injection Inject 50 mg into the muscle every 28 (twenty-eight) days.  [provider]  hydroxypropyl methylcellulose / hypromellose (ISOPTO TEARS / GONIOVISC) 2.5 % ophthalmic solution Place 1 drop into both eyes 3 (three) times daily as needed for dry eyes. 08/17/21   [name], MD  lactulose  (CHRONULAC ) 10 GM/15ML solution Take 20 g by mouth 3 (three) times a week.    [provider]  levofloxacin  (LEVAQUIN ) 500 MG tablet Take 1.5 tablets (750 mg total) by mouth daily. 05/28/22   [name], PA-C  mupirocin  cream (BACTROBAN ) 2 % Apply 1 Application topically 2 (two) times daily. 11/04/21   Agent Lenis, PA-C  omeprazole (PRILOSEC) 20 MG capsule Take 40 mg by mouth 2 (two) times daily before a meal.    [provider]  oxybutynin  (DITROPAN ) 5 MG tablet Take 5 mg by mouth 3 (three) times daily as needed for bladder spasms.    [provider]  oxyCODONE  (ROXICODONE ) 15 MG immediate release tablet Take 15 mg by mouth 3 (three) times daily as needed. 12/13/21   [provider]  phenazopyridine  (PYRIDIUM ) 200 MG tablet Take 1 tablet (200 mg total) by mouth 3 (three) times daily. 05/24/22   Defelice, [name], NP  polyethylene glycol  (MIRALAX  / GLYCOLAX ) packet Take 17 g by mouth 3 (three) times a week.    [provider]  sertraline  (ZOLOFT ) 50 MG tablet Take 50 mg by mouth daily.    [provider]  VIAGRA 100 MG tablet Take 100 mg by mouth as needed for erectile dysfunction.  01/24/16   [provider]    Physical Exam: Vitals:   06/25/23 1845 06/25/23 1945 06/25/23 2000 06/25/23 2020  BP: (!) 140/76 (!) 144/79 (!) 148/77 137/80  Pulse: 97 90 87 94  Resp: (!) 22 20 20  (!) 24  Temp:      TempSrc:      SpO2: 100% 99% 100% 96%  Weight:        Physical Exam Vitals and nursing note reviewed.  Constitutional:      Appearance: He is obese.  HENT:     Head: Normocephalic and atraumatic.     Nose: Nose normal.  Eyes:     General: No scleral icterus. Cardiovascular:     Rate and Rhythm: Normal rate and regular rhythm.  Pulmonary:     Effort: Pulmonary effort is normal.     Breath sounds: Normal breath sounds.  Abdominal:     General: Abdomen is protuberant. There is no distension.     Palpations: Abdomen is soft.     Tenderness: There is no abdominal tenderness.  Musculoskeletal:     Comments: Left lower leg in a Ace bandage and splint.  Right lower extremity with +1 pitting pretibial edema.  Skin:    General: Skin is warm and dry.     Capillary Refill: Capillary refill takes less than 2 seconds.  Neurological:     Mental Status: He is alert and oriented to person, place, and time.      Labs on Admission: I have personally reviewed following labs and imaging studies  CBC: Recent Labs  Lab 06/25/23 1441  WBC 6.0  NEUTROABS 4.9  HGB 14.2  HCT 42.1  MCV 88.6  PLT 189   Basic Metabolic Panel: Recent Labs  Lab 06/25/23 1441  NA 139  K 3.6  CL 103  CO2 24  GLUCOSE 104*  BUN 10  CREATININE 1.02  CALCIUM  8.5*   GFR: Estimated Creatinine Clearance: 98.3 mL/min (by C-G formula based on SCr of  1.02 mg/dL). Liver Function Tests: Recent Labs  Lab 06/25/23 1441   AST 39  ALT 24  ALKPHOS 53  BILITOT 0.8  PROT 6.4*  ALBUMIN  3.0*   Coagulation Profile: Recent Labs  Lab 06/25/23 1441  INR 1.0   BNP (last 3 results) Recent Labs    01/19/23 2308 06/25/23 1441  BNP 15.6 66.8   Urine analysis:    Component Value Date/Time   COLORURINE YELLOW 06/25/2023 1620   APPEARANCEUR HAZY (A) 06/25/2023 1620   LABSPEC 1.020 06/25/2023 1620   PHURINE 6.0 06/25/2023 1620   GLUCOSEU NEGATIVE 06/25/2023 1620   HGBUR SMALL (A) 06/25/2023 1620   BILIRUBINUR NEGATIVE 06/25/2023 1620   KETONESUR 40 (A) 06/25/2023 1620   PROTEINUR NEGATIVE 06/25/2023 1620   UROBILINOGEN 0.2 05/27/2022 1536   NITRITE POSITIVE (A) 06/25/2023 1620   LEUKOCYTESUR MODERATE (A) 06/25/2023 1620    Radiological Exams on Admission: I have personally reviewed images DG Ankle Left Port Result Date: 06/25/2023 CLINICAL DATA:  Ankle fracture EXAM: PORTABLE LEFT ANKLE - 2 VIEW COMPARISON:  06/25/2023 FINDINGS: Casting material limits osseous detail. Displaced distal fibular and medial malleolar fractures are again noted. Decreased posterior subluxation of the talus but with residual widening of the anterior joint space IMPRESSION: Casting material limits osseous detail. Displaced distal fibular and medial malleolar fractures. Decreased posterior subluxation of the talus but with residual widening of the anterior joint space. Electronically Signed   By: Luke Bun M.D.   On: 06/25/2023 19:16   CT Head Wo Contrast Result Date: 06/25/2023 CLINICAL DATA:  Mental status change EXAM: CT HEAD WITHOUT CONTRAST TECHNIQUE: Contiguous axial images were obtained from the base of the skull through the vertex without intravenous contrast. RADIATION DOSE REDUCTION: This exam was performed according to the departmental dose-optimization program which includes automated exposure control, adjustment of the mA and/or kV according to patient size and/or use of iterative reconstruction technique. COMPARISON:   Head CT 08/17/2021 FINDINGS: Brain: No evidence of acute infarction, hemorrhage, hydrocephalus, extra-axial collection or mass lesion/mass effect. Vascular: No hyperdense vessel or unexpected calcification. Skull: Normal. Negative for fracture or focal lesion. Sinuses/Orbits: There is mucosal thickening of the paranasal sinuses diffusely. There is a small air-fluid level in the right maxillary sinus. Mastoid air cells are clear. Orbits are within normal limits. Other: None. IMPRESSION: 1. No acute intracranial process. 2. Paranasal sinus disease. Electronically Signed   By: Greig Pique M.D.   On: 06/25/2023 17:29   VAS US  LOWER EXTREMITY VENOUS (DVT) (7a-7p) Result Date: 06/25/2023  Lower Venous DVT Study Patient Name:  DONIELLE RADZIEWICZ  Date of Exam:   06/25/2023 Medical Rec #: [id number]         Accession #:    [id number] Date of Birth: 01-03-1956         Patient Gender: M Patient Age:   15 years Exam Location:  [facility] Procedure:      VAS US  LOWER EXTREMITY VENOUS (DVT) Referring Phys: LYNWOOD TEMPLETON --------------------------------------------------------------------------------  Indications: Swelling, and Edema.  Comparison Study: Previous study on 5.7.2024. Performing Technologist: Edilia Elden Appl  Examination Guidelines: A complete evaluation includes B-mode imaging, spectral Doppler, color Doppler, and power Doppler as needed of all accessible portions of each vessel. Bilateral testing is considered an integral part of a complete examination. Limited examinations for reoccurring indications may be performed as noted. The reflux portion of the exam is performed with the patient in reverse Trendelenburg.  +-----+---------------+---------+-----------+----------+--------------+ RIGHTCompressibilityPhasicitySpontaneityPropertiesThrombus Aging +-----+---------------+---------+-----------+----------+--------------+ CFV  Full  Yes      Yes                                  +-----+---------------+---------+-----------+----------+--------------+ SFJ  Full           Yes      Yes                                 +-----+---------------+---------+-----------+----------+--------------+   +---------+---------------+---------+-----------+----------+-------------------+ LEFT     CompressibilityPhasicitySpontaneityPropertiesThrombus Aging      +---------+---------------+---------+-----------+----------+-------------------+ CFV      Full           Yes      Yes                                      +---------+---------------+---------+-----------+----------+-------------------+ SFJ      Full           Yes      Yes                                      +---------+---------------+---------+-----------+----------+-------------------+ FV Prox  Full                                                             +---------+---------------+---------+-----------+----------+-------------------+ FV Mid   Full                                                             +---------+---------------+---------+-----------+----------+-------------------+ FV DistalFull                                                             +---------+---------------+---------+-----------+----------+-------------------+ PFV      Full                                                             +---------+---------------+---------+-----------+----------+-------------------+ POP      Full           Yes      Yes                                      +---------+---------------+---------+-----------+----------+-------------------+ PTV  Limited, patent by                                                        color.              +---------+---------------+---------+-----------+----------+-------------------+ PERO                                                  Limited, patent by                                                         color.              +---------+---------------+---------+-----------+----------+-------------------+    Summary: RIGHT: - No evidence of common femoral vein obstruction.   LEFT: - There is no evidence of deep vein thrombosis in the lower extremity.  - No cystic structure found in the popliteal fossa.  *See table(s) above for measurements and observations.    Preliminary    DG Ankle Complete Left Result Date: 06/25/2023 CLINICAL DATA:  Ankle pain and swelling EXAM: LEFT ANKLE COMPLETE - 3+ VIEW COMPARISON:  None Available. FINDINGS: Acute appearing fracture involving the distal shaft of the fibula with a bowel 1/4 shaft diameter lateral and posterior displacement of distal fracture fragment. Acute mildly displaced medial malleolar fracture. Mild posterior subluxation of the talar dome with respect to the distal tibia. Diffuse soft tissue swelling. IMPRESSION: Acute mildly displaced distal fibular shaft fracture. Acute mildly displaced medial malleolar fracture. Mild posterior subluxation of the talar dome with respect to the distal tibia. Electronically Signed   By: Luke Bun M.D.   On: 06/25/2023 16:03   DG Chest 2 View Result Date: 06/25/2023 CLINICAL DATA:  Suspected sepsis ankle swelling EXAM: CHEST - 2 VIEW COMPARISON:  01/19/2023 FINDINGS: Normal cardiac size. No significant effusion. No focal consolidation. Question nodular opacity at the posterior lung base on 1 of the 2 lateral images. IMPRESSION: No active cardiopulmonary disease. Question nodular opacity at the posterior lung base on 1 of the 2 lateral images. Consider further assessment with chest CT Electronically Signed   By: Luke Bun M.D.   On: 06/25/2023 16:00   EKG: My personal interpretation of EKG shows: NSR, RBBB   Assessment/Plan Principal Problem:   Trimalleolar fracture of ankle, closed, left, initial encounter Active Problems:   Self-catheterizes urinary bladder   Schizophrenia (HCC)    Neurogenic bladder   Essential (primary) hypertension   Fecal incontinence   DNR (do not resuscitate)/DNI(Do not intubate)  Assessment and Plan: * Trimalleolar fracture of ankle, closed, left, initial encounter Admit to obs med/tele bed.  Left ankle has already been reduced by the ER.  Placed in a posterior splint.  Orthopedics Dr. Sherida consulted.  N.p.o. after midnight in case he needs operative intervention.  P.o. Percocet for pain as needed.  Fecal incontinence Patient describes chronic fecal incontinence.  He relates this to his injury he sustained while he was in the [employer].  Essential (primary) hypertension Stable for now.  Unclear what his blood pressure medicines are.  Neurogenic bladder Patient has a history of neurogenic bladder.  He uses self-catheterization to empty his bladder.  Schizophrenia [facility]) Patient has a history of schizophrenia per the EMR.  He gets all of his care at the [facility].  Will need the pharmacy technician to perform a medicine reconciliation after speaking with the [facility] pharmacy tomorrow.  Self-catheterizes urinary bladder Patient chronically self catheterizes every 3-4 hours.  He is urinating normally now.  I think his bladder is likely chronically contaminated and colonized with bacteria.  He has no systemic signs of infection.  Will hold on any further antibiotic therapy for now.  DNR (do not resuscitate)/DNI(Do not intubate) Discussed CODE STATUS with the patient.  He is in my opinion medically competent to make decisions.  He is alert and oriented x 3.  He has electively made himself DNR/DNI.   DVT prophylaxis: SQ Heparin  Code Status: DNR/DNI(Do NOT Intubate) Family Communication: no family at bedside  Disposition Plan: return home  Consults called: Dr. Sherida with ortho has been consulted by EDP Admission status: Observation, Med-Surg   Camellia Door, DO [facility] 06/25/2023, 8:25 PM

## 2023-06-25 NOTE — Assessment & Plan Note (Addendum)
06-25-2023 Discussed CODE STATUS with the patient.  He is in my opinion medically competent to make decisions.  He is alert and oriented x 3.  He has electively made himself DNR/DNI.

## 2023-06-25 NOTE — ED Provider Notes (Signed)
 [place] [facility] AT [facility] Provider Note   CSN: [id number] Arrival date & time: 06/25/23  1410     History  Chief Complaint  Patient presents with   Leg Swelling    Timothy [name] is a 68 y.o. male, hx of schizophrenia, who presents to the ED 2/2 to bilateral leg swelling and pain that has been going on since January 1st. Patient has limited hx.   States that his left leg hurts.  Denies any fevers, chills. Unknown trauma.  Home Medications Prior to Admission medications   Medication Sig Start Date End Date Taking? Authorizing Provider  bisacodyl  (DULCOLAX) 5 MG EC tablet Take 5 mg by mouth daily as needed for moderate constipation.    [provider]  Calcium  Carb-Cholecalciferol  (CALCIUM -VITAMIN D ) 500-400 MG-UNIT TABS Take 1 tablet by mouth daily.    [provider]  cephALEXin  (KEFLEX ) 500 MG capsule Take 1 capsule (500 mg total) by mouth 4 (four) times daily. 06/17/23   [name], PA-C  cetirizine  (ZYRTEC  ALLERGY) 10 MG tablet Take 1 tablet (10 mg total) by mouth daily. 07/19/20   Graham, [name], PA-C  cyclobenzaprine  (FLEXERIL ) 10 MG tablet Take 1 tablet (10 mg total) by mouth 2 (two) times daily as needed for muscle spasms. 02/27/21   Dreama Longs, MD  diclofenac  (VOLTAREN ) 50 MG EC tablet Take 1 tablet (50 mg total) by mouth 2 (two) times daily as needed (pain). 12/17/21   Banister, [name], MD  diclofenac  Sodium (VOLTAREN ) 1 % GEL Apply 2 g topically 4 (four) times daily. 11/20/20   [name], NP  diphenhydrAMINE  (BENADRYL ) 25 mg capsule Take 25 mg by mouth at bedtime as needed for itching or sleep.    [provider]  furosemide  (LASIX ) 20 MG tablet Take 1 tablet (20 mg total) by mouth daily for 5 days. 01/20/23 01/25/23  [name], PA-C  gabapentin  (NEURONTIN ) 300 MG capsule Take 1 capsule (300 mg total) by mouth 3 (three) times daily. 06/18/20   [name], PA-C  haloperidol  (HALDOL ) 5 MG  tablet Take 5 mg by mouth at bedtime.    [provider]  haloperidol  decanoate (HALDOL  DECANOATE) 50 MG/ML injection Inject 50 mg into the muscle every 28 (twenty-eight) days.    [provider]  hydroxypropyl methylcellulose / hypromellose (ISOPTO TEARS / GONIOVISC) 2.5 % ophthalmic solution Place 1 drop into both eyes 3 (three) times daily as needed for dry eyes. 08/17/21   [name], MD  lactulose  (CHRONULAC ) 10 GM/15ML solution Take 20 g by mouth 3 (three) times a week.    [provider]  levofloxacin  (LEVAQUIN ) 500 MG tablet Take 1.5 tablets (750 mg total) by mouth daily. 05/28/22   [name], PA-C  mupirocin  cream (BACTROBAN ) 2 % Apply 1 Application topically 2 (two) times daily. 11/04/21   Agent Lenis, PA-C  omeprazole (PRILOSEC) 20 MG capsule Take 40 mg by mouth 2 (two) times daily before a meal.    [provider]  oxybutynin  (DITROPAN ) 5 MG tablet Take 5 mg by mouth 3 (three) times daily as needed for bladder spasms.    [provider]  oxyCODONE  (ROXICODONE ) 15 MG immediate release tablet Take 15 mg by mouth 3 (three) times daily as needed. 12/13/21   [provider]  phenazopyridine  (PYRIDIUM ) 200 MG tablet Take 1 tablet (200 mg total) by mouth 3 (three) times daily. 05/24/22   Defelice, [name], NP  polyethylene glycol (MIRALAX  /  GLYCOLAX ) packet Take 17 g by mouth 3 (three) times a week.    [provider]  sertraline  (ZOLOFT ) 50 MG tablet Take 50 mg by mouth daily.    [provider]  VIAGRA 100 MG tablet Take 100 mg by mouth as needed for erectile dysfunction.  01/24/16   [provider]      Allergies    Penicillins    Review of Systems   Review of Systems  Constitutional:  Negative for fever.  Cardiovascular:  Positive for leg swelling.    Physical Exam Updated Vital Signs BP (!) 140/76   Pulse 97   Temp 98.1 F (36.7 C) (Oral)   Resp (!) 22   Wt 124 kg   SpO2 100%   BMI 35.10  kg/m  Physical Exam Vitals and nursing note reviewed.  Constitutional:      General: He is not in acute distress.    Appearance: He is well-developed.     Comments: Acting oddly, showing his penis to nursing staff, making nonsensical comments, but alert and oriented x 3.  HENT:     Head: Normocephalic and atraumatic.  Eyes:     Conjunctiva/sclera: Conjunctivae normal.  Cardiovascular:     Rate and Rhythm: Normal rate and regular rhythm.     Heart sounds: No murmur heard. Pulmonary:     Effort: Pulmonary effort is normal. No respiratory distress.     Breath sounds: Normal breath sounds.  Abdominal:     Palpations: Abdomen is soft.     Tenderness: There is no abdominal tenderness.  Musculoskeletal:        General: No swelling.     Cervical back: Neck supple.     Comments: Left ankle/leg/foot: TTP of medial malleolus. Edema noted to ankle and foot. Able/Not able to bear weight. Able to plantar flex and dorsiflex ankle. Inversion/eversion intact. Negative Thompson test. No midfoot or base of 5th metatarsal tenderness to palpation. Capillary refill <2sec. Dorsalis pedis pulse present--obtained via doppler. No foot drop noted. Sensation intact. Warm to touch.   Bilateral lower extremity edema--L>R, erythema of LLE  Skin:    General: Skin is warm and dry.     Capillary Refill: Capillary refill takes less than 2 seconds.  Neurological:     Mental Status: He is alert.  Psychiatric:        Mood and Affect: Mood normal.       ED Results / Procedures / Treatments   Labs (all labs ordered are listed, but only abnormal results are displayed) Labs Reviewed  COMPREHENSIVE METABOLIC PANEL - Abnormal; Notable for the following components:      Result Value   Glucose, Bld 104 (*)    Calcium  8.5 (*)    Total Protein 6.4 (*)    Albumin  3.0 (*)    All other components within normal limits  URINALYSIS, W/ REFLEX TO CULTURE (INFECTION SUSPECTED) - Abnormal; Notable for the following  components:   APPearance HAZY (*)    Hgb urine dipstick [name] (*)    Ketones, ur 40 (*)    Nitrite POSITIVE (*)    Leukocytes,Ua MODERATE (*)    Bacteria, UA MANY (*)    All other components within normal limits  CULTURE, BLOOD (ROUTINE X 2)  CULTURE, BLOOD (ROUTINE X 2)  CBC WITH DIFFERENTIAL/PLATELET  PROTIME-INR  BRAIN NATRIURETIC PEPTIDE  RAPID URINE DRUG SCREEN, HOSP PERFORMED  ETHANOL  I-STAT CG4 LACTIC ACID, ED    EKG EKG Interpretation Date/Time:  Thursday  June 25 2023 15:36:05 EST Ventricular Rate:  99 PR Interval:  52 QRS Duration:  132 QT Interval:  353 QTC Calculation: 453 R Axis:   266  Text Interpretation: Sinus rhythm Right bundle branch block Confirmed by Cottie Cough [phone]) on 06/25/2023 3:42:43 PM  Radiology DG Ankle Left Port Result Date: 06/25/2023 CLINICAL DATA:  Ankle fracture EXAM: PORTABLE LEFT ANKLE - 2 VIEW COMPARISON:  06/25/2023 FINDINGS: Casting material limits osseous detail. Displaced distal fibular and medial malleolar fractures are again noted. Decreased posterior subluxation of the talus but with residual widening of the anterior joint space IMPRESSION: Casting material limits osseous detail. Displaced distal fibular and medial malleolar fractures. Decreased posterior subluxation of the talus but with residual widening of the anterior joint space. Electronically Signed   By: Luke Bun M.D.   On: 06/25/2023 19:16   CT Head Wo Contrast Result Date: 06/25/2023 CLINICAL DATA:  Mental status change EXAM: CT HEAD WITHOUT CONTRAST TECHNIQUE: Contiguous axial images were obtained from the base of the skull through the vertex without intravenous contrast. RADIATION DOSE REDUCTION: This exam was performed according to the departmental dose-optimization program which includes automated exposure control, adjustment of the mA and/or kV according to patient size and/or use of iterative reconstruction technique. COMPARISON:  Head CT 08/17/2021 FINDINGS:  Brain: No evidence of acute infarction, hemorrhage, hydrocephalus, extra-axial collection or mass lesion/mass effect. Vascular: No hyperdense vessel or unexpected calcification. Skull: Normal. Negative for fracture or focal lesion. Sinuses/Orbits: There is mucosal thickening of the paranasal sinuses diffusely. There is a Hadeel Hillebrand air-fluid level in the right maxillary sinus. Mastoid air cells are clear. Orbits are within normal limits. Other: None. IMPRESSION: 1. No acute intracranial process. 2. Paranasal sinus disease. Electronically Signed   By: Greig Pique M.D.   On: 06/25/2023 17:29   VAS US  LOWER EXTREMITY VENOUS (DVT) (7a-7p) Result Date: 06/25/2023  Lower Venous DVT Study Patient Name:  DORAN NESTLE  Date of Exam:   06/25/2023 Medical Rec #: [id number]         Accession #:    [id number] Date of Birth: January 06, 1956         Patient Gender: M Patient Age:   24 years Exam Location:  [facility] Procedure:      VAS US  LOWER EXTREMITY VENOUS (DVT) Referring Phys: LYNWOOD TEMPLETON --------------------------------------------------------------------------------  Indications: Swelling, and Edema.  Comparison Study: Previous study on 5.7.2024. Performing Technologist: Edilia Elden Appl  Examination Guidelines: A complete evaluation includes B-mode imaging, spectral Doppler, color Doppler, and power Doppler as needed of all accessible portions of each vessel. Bilateral testing is considered an integral part of a complete examination. Limited examinations for reoccurring indications may be performed as noted. The reflux portion of the exam is performed with the patient in reverse Trendelenburg.  +-----+---------------+---------+-----------+----------+--------------+ RIGHTCompressibilityPhasicitySpontaneityPropertiesThrombus Aging +-----+---------------+---------+-----------+----------+--------------+ CFV  Full           Yes      Yes                                  +-----+---------------+---------+-----------+----------+--------------+ SFJ  Full           Yes      Yes                                 +-----+---------------+---------+-----------+----------+--------------+   +---------+---------------+---------+-----------+----------+-------------------+ LEFT     CompressibilityPhasicitySpontaneityPropertiesThrombus Aging      +---------+---------------+---------+-----------+----------+-------------------+  CFV      Full           Yes      Yes                                      +---------+---------------+---------+-----------+----------+-------------------+ SFJ      Full           Yes      Yes                                      +---------+---------------+---------+-----------+----------+-------------------+ FV Prox  Full                                                             +---------+---------------+---------+-----------+----------+-------------------+ FV Mid   Full                                                             +---------+---------------+---------+-----------+----------+-------------------+ FV DistalFull                                                             +---------+---------------+---------+-----------+----------+-------------------+ PFV      Full                                                             +---------+---------------+---------+-----------+----------+-------------------+ POP      Full           Yes      Yes                                      +---------+---------------+---------+-----------+----------+-------------------+ PTV                                                   Limited, patent by                                                        color.              +---------+---------------+---------+-----------+----------+-------------------+ PERO  Limited, patent by                                                         color.              +---------+---------------+---------+-----------+----------+-------------------+    Summary: RIGHT: - No evidence of common femoral vein obstruction.   LEFT: - There is no evidence of deep vein thrombosis in the lower extremity.  - No cystic structure found in the popliteal fossa.  *See table(s) above for measurements and observations.    Preliminary    DG Ankle Complete Left Result Date: 06/25/2023 CLINICAL DATA:  Ankle pain and swelling EXAM: LEFT ANKLE COMPLETE - 3+ VIEW COMPARISON:  None Available. FINDINGS: Acute appearing fracture involving the distal shaft of the fibula with a bowel 1/4 shaft diameter lateral and posterior displacement of distal fracture fragment. Acute mildly displaced medial malleolar fracture. Mild posterior subluxation of the talar dome with respect to the distal tibia. Diffuse soft tissue swelling. IMPRESSION: Acute mildly displaced distal fibular shaft fracture. Acute mildly displaced medial malleolar fracture. Mild posterior subluxation of the talar dome with respect to the distal tibia. Electronically Signed   By: Luke Bun M.D.   On: 06/25/2023 16:03   DG Chest 2 View Result Date: 06/25/2023 CLINICAL DATA:  Suspected sepsis ankle swelling EXAM: CHEST - 2 VIEW COMPARISON:  01/19/2023 FINDINGS: Normal cardiac size. No significant effusion. No focal consolidation. Question nodular opacity at the posterior lung base on 1 of the 2 lateral images. IMPRESSION: No active cardiopulmonary disease. Question nodular opacity at the posterior lung base on 1 of the 2 lateral images. Consider further assessment with chest CT Electronically Signed   By: Luke Bun M.D.   On: 06/25/2023 16:00    Procedures Procedures    Medications Ordered in ED Medications  oxyCODONE  (Oxy IR/ROXICODONE ) immediate release tablet 10 mg (has no administration in time range)  cefTRIAXone  (ROCEPHIN ) 1 g in sodium chloride  0.9 % 100 mL IVPB (0 g  Intravenous Stopped 06/25/23 1827)  ketamine  50 mg in normal saline 5 mL (10 mg/mL) syringe (60 mg Intravenous Given 06/25/23 1834)  propofol  (DIPRIVAN ) 10 mg/mL bolus/IV push 62 mg (62 mg Intravenous Given 06/25/23 1835)    ED Course/ Medical Decision Making/ A&P Clinical Course as of 06/25/23 2016  Thu Jun 25, 2023  5711 68 year old gentleman with a history of schizophrenia presenting to the ED with pain in his left ankle, redness and swelling, unclear inciting event, the patient cannot recall.  Also has a history of self-catheterization and recurrent UTIs.  Workup in the ED was concerning for significant edema with warmth and overlying erythema of the left lower ankle and ecchymoses, fracture of the ankle joint noted with mild subluxation seen on ankle x-ray.  Patient's UA also shows evidence of infection with positive nitrites and leukocytes.  Patient was given Rocephin  across cover for UTI and potential skin infection or cellulitis.  He does not have evidence of sepsis.  Lactate and white blood cell count normal.  We consulted with an orthopedic surgeon Dr. Sherida who had requested that we attempt a bedside reduction of this subluxation and mild angulation.  The orthopedic surgeon did feel that this was needed on an emergent basis, as explained that I did not feel the patient was able to  provide informed consent for himself, and we are not able to reach any immediate family members or power of attorney by phone.  Based on this fact, I attempted to consent the patient as best I could about the medications and the procedure.  He was given ketamine  and propofol .  We attempted a bedside reduction, but the patient had significant edema of the lower extremity which limited my ability to manipulate his ankle joint.  He is placed in a posterior and stirrup leg splint.  He will be admitted to the hospital with orthopedic consultation.  He is now awake from his sedation.  No complications with conscious sedation. [MT]     Clinical Course User Index [MT] Trifan, Donnice PARAS, MD                                 Medical Decision Making Patient is a well-appearing 68 year old male, who appears very altered on exam, he makes nonsensical responses, to inquiries.  Is overall well-appearing, however has diffusely edematous bilateral legs, with worsening swelling of the left lower extremity, with extensive bruising and erythema, see picture for further details.  Concern for possible fracture, versus traumatic DVT, patient cannot provide any history, thus will order both imaging studies.  He does have a pulse, that was found via Doppler.  Has no other pain.  Will obtain blood work, as well as chest x-ray, given diffuse swelling and altered mental status.  CT head ordered, given altered mental status  Amount and/or Complexity of Data Reviewed Labs: ordered.    Details: Many bacteria in the urine, no leukocytosis Radiology: ordered.    Details: CT head shows no acute findings, found to have trimalleolar fracture, on ankle x-ray, chest x-ray is clear except for nodule opacity Discussion of management or test interpretation with external provider(s): Discussed extensively with Dr. Sherida, orthopedics, he requests that the patient have reduction, see Dr. Axel note for further detail.  Dr. Luisa attempted reduction, will require admission, given cellulitis, of the left lower extremity see pictures above, as well as altered mental status with urinary tract infection associated with catheter.  I attempted to get a hold of his only contact, with the permission of the patient, who states that the patient sounds much more confused than he typically is.  I was unable to get a hold of anyone that lives with the patient.  I spoke with Dr. Lesleigh in regards to this, given my concern for acute altered mental status, with inability to speak to other family members of his, and he evaluated patient and will admit for ops, for further evaluation by  Dr. Sherida in the a.m. for his ankle  Risk Prescription drug management. Decision regarding hospitalization.    Final Clinical Impression(s) / ED Diagnoses Final diagnoses:  Urinary tract infection without hematuria, site unspecified  Altered mental status, unspecified altered mental status type  Closed trimalleolar fracture of left ankle, initial encounter  Cellulitis of left lower extremity    Rx / DC Orders ED Discharge Orders     None         Sammantha Mehlhaff, Lyle CROME, PA 06/25/23 2319    Cottie Donnice PARAS, MD 06/26/23 [phone]

## 2023-06-25 NOTE — Assessment & Plan Note (Addendum)
 06-25-2023. Admit to obs med/tele bed.  Left ankle has already been reduced by the ER.  Placed in a posterior splint.  Orthopedics Dr. Sherida consulted.  N.p.o. after midnight in case he needs operative intervention.  P.o. Percocet for pain as needed. 06-26-2023 through 07-09-2023  S/p ORIF -2/12 Dr. Kendal. Secondary to fall 5 weeks ago. Pain regimen: Tylenol  as needed, oxycodone  as needed.  Bowel regimen: MiraLAX  as needed.  DVT prophylaxis with Lovenox  in the hospital.  Aspirin  on discharge. 07-10-2023 pt unable to walk with PT.  Will need SNF 07-11-2023 awaiting SNF placement.  07-12-2023 awaiting TOC to arrange [facility]-approved SN[facility] placement.

## 2023-06-25 NOTE — ED Notes (Signed)
 ED TO INPATIENT HANDOFF REPORT  ED Nurse Name and Phone #:   Prentice GLADE  RN  [phone]  S Name/Age/Gender Timothy [name] 68 y.o. male Room/Bed: 044C/044C  Code Status   Code Status: Limited: Do not attempt resuscitation (DNR) -DNR-LIMITED -Do Not Intubate/DNI   Home/SNF/Other Home Patient oriented to: self, place, and situation Is this baseline? Yes   Triage Complete: Triage complete  Chief Complaint Trimalleolar fracture of ankle, closed, left, initial encounter [S82.852A]  Triage Note Pt presents for bilateral leg swelling that has been present for months but became much worse new year's day. Redness and swelling noted to L>R. He also self caths.  Endorses cough x 1 day, Denies fever   HR 103, BP 98/68 in triage   Allergies Allergies  Allergen Reactions   Penicillins Anaphylaxis    Has patient had a PCN reaction causing immediate rash, facial/tongue/throat swelling, SOB or lightheadedness with hypotension: Yes Has patient had a PCN reaction causing severe rash involving mucus membranes or skin necrosis: No Has patient had a PCN reaction that required hospitalization: Yes Has patient had a PCN reaction occurring within the last 10 years: No If all of the above answers are NO, then may proceed with Cephalosporin use.     Level of Care/Admitting Diagnosis ED Disposition     ED Disposition  Admit   Condition  --   Comment  Hospital Area: [facility] [[id number]]  Level of Care: Med-Surg [[id number]]  May place patient in observation at [facility] or [facility] if equivalent level of care is available:: No  Covid Evaluation: Asymptomatic - no recent exposure (last 10 days) testing not required  Diagnosis: Trimalleolar fracture of ankle, closed, left, initial encounter [[id number]]  Admitting Physician: LAURENCE, ERIC [[id number]]  Attending Physician: LAURENCE, ERIC [[id number]]          B Medical/Surgery History Past Medical History:  Diagnosis Date   Behavioral  disorder    Cancer [facility])    prostate cancer & cancer in his spine   Chronic back pain    Constipation    Decreased mobility    uses wheelchair, spinal cancer   Gonorrhea    Impaired mobility    prostate cancer cancer in his spine led to surgery and some decrease use of his lower extremities, neurogenic bladder   Intermittent self-catheterization of bladder    Neurogenic bladder    Paranoid schizophrenia (HCC)    PTSD (post-traumatic stress disorder)    Past Surgical History:  Procedure Laterality Date   BACK SURGERY       A IV Location/Drains/Wounds Patient Lines/Drains/Airways Status     Active Line/Drains/Airways     Name Placement date Placement time Site Days   Peripheral IV 06/25/23 20 G 1 Left;Posterior Hand 06/25/23  1628  Hand  less than 1   External Urinary Catheter 06/25/23  1658  --  less than 1            Intake/Output Last 24 hours  Intake/Output Summary (Last 24 hours) at 06/25/2023 2104 Last data filed at 06/25/2023 1827 Gross per 24 hour  Intake 100 ml  Output --  Net 100 ml    Labs/Imaging Results for orders placed or performed during the hospital encounter of 06/25/23 (from the past 48 hours)  Comprehensive metabolic panel     Status: Abnormal   Collection Time: 06/25/23  2:41 PM  Result Value Ref Range   Sodium 139 135 - 145 mmol/L   Potassium 3.6  3.5 - 5.1 mmol/L   Chloride 103 98 - 111 mmol/L   CO2 24 22 - 32 mmol/L   Glucose, Bld 104 (H) 70 - 99 mg/dL    Comment: Glucose reference range applies only to samples taken after fasting for at least 8 hours.   BUN 10 8 - 23 mg/dL   Creatinine, Ser 8.97 0.61 - 1.24 mg/dL   Calcium  8.5 (L) 8.9 - 10.3 mg/dL   Total Protein 6.4 (L) 6.5 - 8.1 g/dL   Albumin  3.0 (L) 3.5 - 5.0 g/dL   AST 39 15 - 41 U/L   ALT 24 0 - 44 U/L   Alkaline Phosphatase 53 38 - 126 U/L   Total Bilirubin 0.8 0.0 - 1.2 mg/dL   GFR, Estimated >39 >39 mL/min    Comment: (NOTE) Calculated using the CKD-EPI Creatinine  Equation (2021)    Anion gap 12 5 - 15    Comment: Performed at [facility], [address] [place], [place] [address]  CBC with Differential     Status: None   Collection Time: 06/25/23  2:41 PM  Result Value Ref Range   WBC 6.0 4.0 - 10.5 K/uL   RBC 4.75 4.22 - 5.81 MIL/uL   Hemoglobin 14.2 13.0 - 17.0 g/dL   HCT 57.8 60.9 - 47.9 %   MCV 88.6 80.0 - 100.0 fL   MCH 29.9 26.0 - 34.0 pg   MCHC 33.7 30.0 - 36.0 g/dL   RDW 87.3 88.4 - 84.4 %   Platelets 189 150 - 400 K/uL   nRBC 0.0 0.0 - 0.2 %   Neutrophils Relative % 83 %   Neutro Abs 4.9 1.7 - 7.7 K/uL   Lymphocytes Relative 12 %   Lymphs Abs 0.7 0.7 - 4.0 K/uL   Monocytes Relative 5 %   Monocytes Absolute 0.3 0.1 - 1.0 K/uL   Eosinophils Relative 0 %   Eosinophils Absolute 0.0 0.0 - 0.5 K/uL   Basophils Relative 0 %   Basophils Absolute 0.0 0.0 - 0.1 K/uL   Immature Granulocytes 0 %   Abs Immature Granulocytes 0.02 0.00 - 0.07 K/uL    Comment: Performed at [facility], [address] [place], [place] [address]  Protime-INR     Status: None   Collection Time: 06/25/23  2:41 PM  Result Value Ref Range   Prothrombin Time 13.8 11.4 - 15.2 seconds   INR 1.0 0.8 - 1.2    Comment: (NOTE) INR goal varies based on device and disease states. Performed at [facility], [address] [place], [place] [address]   Brain natriuretic peptide     Status: None   Collection Time: 06/25/23  2:41 PM  Result Value Ref Range   B Natriuretic Peptide 66.8 0.0 - 100.0 pg/mL    Comment: Performed at [facility], [address], [place], [place] [address]  I-Stat Lactic Acid, ED     Status: None   Collection Time: 06/25/23  2:47 PM  Result Value Ref Range   Lactic Acid, Venous 1.1 0.5 - 1.9 mmol/L  Urinalysis, w/ Reflex to Culture (Infection Suspected) -Urine, Clean Catch     Status: Abnormal   Collection Time: 06/25/23  4:20 PM  Result Value Ref Range   Specimen Source URINE, CATHETERIZED    Color, Urine  YELLOW YELLOW   APPearance HAZY (A) CLEAR   Specific Gravity, Urine 1.020 1.005 - 1.030   pH 6.0 5.0 -  8.0   Glucose, UA NEGATIVE NEGATIVE mg/dL   Hgb urine dipstick SMALL (A) NEGATIVE   Bilirubin Urine NEGATIVE NEGATIVE   Ketones, ur 40 (A) NEGATIVE mg/dL   Protein, ur NEGATIVE NEGATIVE mg/dL   Nitrite POSITIVE (A) NEGATIVE   Leukocytes,Ua MODERATE (A) NEGATIVE   Squamous Epithelial / HPF 0-5 0 - 5 /HPF   WBC, UA 6-10 0 - 5 WBC/hpf    Comment: Reflex urine culture not performed if WBC <=10, OR if Squamous epithelial cells >5. If Squamous epithelial cells >5, suggest recollection.   RBC / HPF 0-5 0 - 5 RBC/hpf   Bacteria, UA MANY (A) NONE SEEN    Comment: Performed at [facility], [address] [place], [place] [address]  Ethanol     Status: None   Collection Time: 06/25/23  4:20 PM  Result Value Ref Range   Alcohol , Ethyl (B) <10 <10 mg/dL    Comment: (NOTE) Lowest detectable limit for serum alcohol  is 10 mg/dL.  For medical purposes only. Performed at [facility], [address] [place], [place] [address]   Rapid urine drug screen (hospital performed)     Status: None   Collection Time: 06/25/23  4:28 PM  Result Value Ref Range   Opiates NONE DETECTED NONE DETECTED   Cocaine NONE DETECTED NONE DETECTED   Benzodiazepines NONE DETECTED NONE DETECTED   Amphetamines NONE DETECTED NONE DETECTED   Tetrahydrocannabinol NONE DETECTED NONE DETECTED   Barbiturates NONE DETECTED NONE DETECTED    Comment: (NOTE) DRUG SCREEN FOR MEDICAL PURPOSES ONLY.  IF CONFIRMATION IS NEEDED FOR ANY PURPOSE, NOTIFY LAB WITHIN 5 DAYS.  LOWEST DETECTABLE LIMITS FOR URINE DRUG SCREEN Drug Class                     Cutoff (ng/mL) Amphetamine and metabolites    1000 Barbiturate and metabolites    200 Benzodiazepine                 200 Opiates and metabolites        300 Cocaine and metabolites        300 THC                            50 Performed at [facility],  [address], [place], [place] [address]    DG Ankle Left Port Result Date: 06/25/2023 CLINICAL DATA:  Ankle fracture EXAM: PORTABLE LEFT ANKLE - 2 VIEW COMPARISON:  06/25/2023 FINDINGS: Casting material limits osseous detail. Displaced distal fibular and medial malleolar fractures are again noted. Decreased posterior subluxation of the talus but with residual widening of the anterior joint space IMPRESSION: Casting material limits osseous detail. Displaced distal fibular and medial malleolar fractures. Decreased posterior subluxation of the talus but with residual widening of the anterior joint space. Electronically Signed   By: Luke Bun M.D.   On: 06/25/2023 19:16   CT Head Wo Contrast Result Date: 06/25/2023 CLINICAL DATA:  Mental status change EXAM: CT HEAD WITHOUT CONTRAST TECHNIQUE: Contiguous axial images were obtained from the base of the skull through the vertex without intravenous contrast. RADIATION DOSE REDUCTION: This exam was performed according to the departmental dose-optimization program which includes automated exposure control, adjustment of the mA and/or kV according to patient size and/or use of iterative reconstruction technique. COMPARISON:  Head CT 08/17/2021 FINDINGS: Brain: No evidence of acute infarction, hemorrhage, hydrocephalus, extra-axial collection or mass  lesion/mass effect. Vascular: No hyperdense vessel or unexpected calcification. Skull: Normal. Negative for fracture or focal lesion. Sinuses/Orbits: There is mucosal thickening of the paranasal sinuses diffusely. There is a small air-fluid level in the right maxillary sinus. Mastoid air cells are clear. Orbits are within normal limits. Other: None. IMPRESSION: 1. No acute intracranial process. 2. Paranasal sinus disease. Electronically Signed   By: Greig Pique M.D.   On: 06/25/2023 17:29   VAS US  LOWER EXTREMITY VENOUS (DVT) (7a-7p) Result Date: 06/25/2023  Lower Venous DVT Study Patient Name:  Timothy [name]  Date  of Exam:   06/25/2023 Medical Rec #: [id number]         Accession #:    [id number] Date of Birth: 12-02-1955         Patient Gender: M Patient Age:   65 years Exam Location:  [facility] Procedure:      VAS US  LOWER EXTREMITY VENOUS (DVT) Referring Phys: LYNWOOD TEMPLETON --------------------------------------------------------------------------------  Indications: Swelling, and Edema.  Comparison Study: Previous study on 5.7.2024. Performing Technologist: Edilia Elden Appl  Examination Guidelines: A complete evaluation includes B-mode imaging, spectral Doppler, color Doppler, and power Doppler as needed of all accessible portions of each vessel. Bilateral testing is considered an integral part of a complete examination. Limited examinations for reoccurring indications may be performed as noted. The reflux portion of the exam is performed with the patient in reverse Trendelenburg.  +-----+---------------+---------+-----------+----------+--------------+ RIGHTCompressibilityPhasicitySpontaneityPropertiesThrombus Aging +-----+---------------+---------+-----------+----------+--------------+ CFV  Full           Yes      Yes                                 +-----+---------------+---------+-----------+----------+--------------+ SFJ  Full           Yes      Yes                                 +-----+---------------+---------+-----------+----------+--------------+   +---------+---------------+---------+-----------+----------+-------------------+ LEFT     CompressibilityPhasicitySpontaneityPropertiesThrombus Aging      +---------+---------------+---------+-----------+----------+-------------------+ CFV      Full           Yes      Yes                                      +---------+---------------+---------+-----------+----------+-------------------+ SFJ      Full           Yes      Yes                                       +---------+---------------+---------+-----------+----------+-------------------+ FV Prox  Full                                                             +---------+---------------+---------+-----------+----------+-------------------+ FV Mid   Full                                                             +---------+---------------+---------+-----------+----------+-------------------+  FV DistalFull                                                             +---------+---------------+---------+-----------+----------+-------------------+ PFV      Full                                                             +---------+---------------+---------+-----------+----------+-------------------+ POP      Full           Yes      Yes                                      +---------+---------------+---------+-----------+----------+-------------------+ PTV                                                   Limited, patent by                                                        color.              +---------+---------------+---------+-----------+----------+-------------------+ PERO                                                  Limited, patent by                                                        color.              +---------+---------------+---------+-----------+----------+-------------------+    Summary: RIGHT: - No evidence of common femoral vein obstruction.   LEFT: - There is no evidence of deep vein thrombosis in the lower extremity.  - No cystic structure found in the popliteal fossa.  *See table(s) above for measurements and observations.    Preliminary    DG Ankle Complete Left Result Date: 06/25/2023 CLINICAL DATA:  Ankle pain and swelling EXAM: LEFT ANKLE COMPLETE - 3+ VIEW COMPARISON:  None Available. FINDINGS: Acute appearing fracture involving the distal shaft of the fibula with a bowel 1/4 shaft diameter lateral and posterior displacement  of distal fracture fragment. Acute mildly displaced medial malleolar fracture. Mild posterior subluxation of the talar dome with respect to the distal tibia. Diffuse soft tissue swelling. IMPRESSION: Acute mildly displaced distal fibular shaft fracture. Acute mildly displaced medial malleolar fracture. Mild posterior subluxation of the talar dome with respect to the distal tibia. Electronically Signed   By:  Luke Bun M.D.   On: 06/25/2023 16:03   DG Chest 2 View Result Date: 06/25/2023 CLINICAL DATA:  Suspected sepsis ankle swelling EXAM: CHEST - 2 VIEW COMPARISON:  01/19/2023 FINDINGS: Normal cardiac size. No significant effusion. No focal consolidation. Question nodular opacity at the posterior lung base on 1 of the 2 lateral images. IMPRESSION: No active cardiopulmonary disease. Question nodular opacity at the posterior lung base on 1 of the 2 lateral images. Consider further assessment with chest CT Electronically Signed   By: Luke Bun M.D.   On: 06/25/2023 16:00    Pending Labs Unresulted Labs (From admission, onward)     Start     Ordered   06/25/23 1433  Culture, blood (Routine x 2)  BLOOD CULTURE X 2,   R (with STAT occurrences)      06/25/23 1432   Signed and Held  HIV Antibody (routine testing w rflx)  (HIV Antibody (Routine testing w reflex) panel)  Add-on,   R        Signed and Held   Signed and Held  Comprehensive metabolic panel  Tomorrow morning,   R        Signed and Held   Signed and Held  CBC with Differential/Platelet  Tomorrow morning,   R        Signed and Held            Vitals/Pain Today's Vitals   06/25/23 2020 06/25/23 2025 06/25/23 2030 06/25/23 2032  BP: 137/80 139/85 (!) 140/85   Pulse: 94 88 97   Resp: (!) 24 20 (!) 25   Temp:  98.5 F (36.9 C)    TempSrc:  Oral    SpO2: 96% 96% 96%   Weight:      PainSc:    0-No pain    Isolation Precautions No active isolations  Medications Medications  oxyCODONE  (Oxy IR/ROXICODONE ) immediate release  tablet 10 mg (has no administration in time range)  cefTRIAXone  (ROCEPHIN ) 1 g in sodium chloride  0.9 % 100 mL IVPB (0 g Intravenous Stopped 06/25/23 1827)  ketamine  50 mg in normal saline 5 mL (10 mg/mL) syringe (60 mg Intravenous Given 06/25/23 1834)  propofol  (DIPRIVAN ) 10 mg/mL bolus/IV push 62 mg (62 mg Intravenous Given 06/25/23 1835)    Mobility walks     Focused Assessments     R Recommendations: See Admitting Provider Note  Report given to:   Additional Notes:

## 2023-06-25 NOTE — Progress Notes (Signed)
 Orthopedic Tech Progress Note Patient Details:  Timothy [name] 1955/12/31 [id number]  Well-padded plaster posterior short leg splint with stirrup applied to LLE in best obtainable fashion following conscious sedation and closed reduction of the L ankle. During the application, the posterior slab was shortened d/t EDP's efforts to keep hold of the ankle. Motion and sensation of digits remain intact following splint application.   Ortho Devices Type of Ortho Device: Stirrup splint, Post (short leg) splint Ortho Device/Splint Location: LLE Ortho Device/Splint Interventions: Ordered, Application, Adjustment   Post Interventions Patient Tolerated: Fair Instructions Provided: Care of device  Kendel Bessey Ronal Brasil 06/25/2023, 7:02 PM

## 2023-06-25 NOTE — Assessment & Plan Note (Addendum)
 06-25-2023 Patient describes chronic fecal incontinence.  He relates this to his injury he sustained while he was in the [employer]. 07-10-2023 chronic. 07-11-2023 stable.  07-12-2023 stable

## 2023-06-25 NOTE — ED Notes (Signed)
 Unable to reach patient contact for more information about mental status.

## 2023-06-25 NOTE — Assessment & Plan Note (Addendum)
 06-25-2023 Stable for now.  Unclear what his blood pressure medicines are. 07-10-2023 BP controlled on no scheduled HTN meds. 07-11-2023 stable.  07-12-2023 stable on no scheduled HTN meds.

## 2023-06-25 NOTE — ED Triage Notes (Addendum)
 Pt presents for bilateral leg swelling that has been present for months but became much worse new year's day. Redness and swelling noted to L>R. He also self caths.  Endorses cough x 1 day, Denies fever   HR 103, BP 98/68 in triage

## 2023-06-26 DIAGNOSIS — S82852A Displaced trimalleolar fracture of left lower leg, initial encounter for closed fracture: Secondary | ICD-10-CM | POA: Diagnosis not present

## 2023-06-26 DIAGNOSIS — S93432A Sprain of tibiofibular ligament of left ankle, initial encounter: Secondary | ICD-10-CM | POA: Diagnosis not present

## 2023-06-26 DIAGNOSIS — I10 Essential (primary) hypertension: Secondary | ICD-10-CM | POA: Diagnosis not present

## 2023-06-26 DIAGNOSIS — F209 Schizophrenia, unspecified: Secondary | ICD-10-CM | POA: Diagnosis not present

## 2023-06-26 LAB — CBC WITH DIFFERENTIAL/PLATELET
Abs Immature Granulocytes: 0.01 10*3/uL (ref 0.00–0.07)
Basophils Absolute: 0 10*3/uL (ref 0.0–0.1)
Basophils Relative: 0 %
Eosinophils Absolute: 0 10*3/uL (ref 0.0–0.5)
Eosinophils Relative: 0 %
HCT: 34.6 % — ABNORMAL LOW (ref 39.0–52.0)
Hemoglobin: 12.2 g/dL — ABNORMAL LOW (ref 13.0–17.0)
Immature Granulocytes: 0 %
Lymphocytes Relative: 17 %
Lymphs Abs: 0.8 10*3/uL (ref 0.7–4.0)
MCH: 30.3 pg (ref 26.0–34.0)
MCHC: 35.3 g/dL (ref 30.0–36.0)
MCV: 85.9 fL (ref 80.0–100.0)
Monocytes Absolute: 0.4 10*3/uL (ref 0.1–1.0)
Monocytes Relative: 8 %
Neutro Abs: 3.3 10*3/uL (ref 1.7–7.7)
Neutrophils Relative %: 75 %
Platelets: 168 10*3/uL (ref 150–400)
RBC: 4.03 MIL/uL — ABNORMAL LOW (ref 4.22–5.81)
RDW: 12.5 % (ref 11.5–15.5)
WBC: 4.5 10*3/uL (ref 4.0–10.5)
nRBC: 0 % (ref 0.0–0.2)

## 2023-06-26 LAB — COMPREHENSIVE METABOLIC PANEL
ALT: 27 U/L (ref 0–44)
AST: 39 U/L (ref 15–41)
Albumin: 2.5 g/dL — ABNORMAL LOW (ref 3.5–5.0)
Alkaline Phosphatase: 44 U/L (ref 38–126)
Anion gap: 11 (ref 5–15)
BUN: 11 mg/dL (ref 8–23)
CO2: 26 mmol/L (ref 22–32)
Calcium: 8.2 mg/dL — ABNORMAL LOW (ref 8.9–10.3)
Chloride: 102 mmol/L (ref 98–111)
Creatinine, Ser: 1.12 mg/dL (ref 0.61–1.24)
GFR, Estimated: >60 mL/min (ref >60)
Glucose, Bld: 121 mg/dL — ABNORMAL HIGH (ref 70–99)
Potassium: 3.3 mmol/L — ABNORMAL LOW (ref 3.5–5.1)
Sodium: 139 mmol/L (ref 135–145)
Total Bilirubin: 0.8 mg/dL (ref 0.0–1.2)
Total Protein: 5.6 g/dL — ABNORMAL LOW (ref 6.5–8.1)

## 2023-06-26 LAB — SURGICAL PCR SCREEN
MRSA, PCR: NEGATIVE
Staphylococcus aureus: NEGATIVE

## 2023-06-26 LAB — HIV ANTIBODY (ROUTINE TESTING W REFLEX): HIV Screen 4th Generation wRfx: NONREACTIVE

## 2023-06-26 MED ORDER — HYDROXYZINE HCL 25 MG PO TABS
25.0000 mg | ORAL_TABLET | Freq: Once | ORAL | Status: AC
  Administered 2023-06-26: 25 mg via ORAL
  Filled 2023-06-26: qty 1

## 2023-06-26 MED ORDER — TAMSULOSIN HCL 0.4 MG PO CAPS
0.4000 mg | ORAL_CAPSULE | Freq: Every day | ORAL | Status: DC
  Administered 2023-06-26: 0.4 mg via ORAL
  Filled 2023-06-26 (×2): qty 1

## 2023-06-26 MED ORDER — POTASSIUM CHLORIDE CRYS ER 20 MEQ PO TBCR
40.0000 meq | EXTENDED_RELEASE_TABLET | Freq: Once | ORAL | Status: AC
Start: 2023-06-26 — End: 2023-06-26
  Administered 2023-06-26: 40 meq via ORAL
  Filled 2023-06-26: qty 2

## 2023-06-26 NOTE — Progress Notes (Signed)
 PROGRESS NOTE  Timothy [name]  DOB: 1956-02-27  PCP: [name], MD FMW:[id number]  DOA: 06/25/2023  LOS: 0 days  Hospital Day: 2  Brief narrative: Timothy [name] is a 68 y.o. male with PMH significant for prostate cancer with vertebral mets, s/p back surgery, wheelchair-bound status, neurogenic bladder requiring self cath, chronic fecal incontinence, gonorrhea, schizophrenia, PTSD, behavioral disturbance who follows up at [facility]. 2/6, patient presented to ED with complaint of bilateral leg swelling He states it has been present for months but became much worse yesterday redness and swelling noted left more than right. Patient was apparently seen in the ED on January 4 for a fall.  He underwent x-ray of left knee at that time which was unremarkable and was discharged.  It seems he did not have ankle x-ray done at that time.  Patient reports pain and swelling since that fall.  In the ED, patient was hemodynamically stable CBC/CMP mostly unremarkable Catheter urinalysis showed hazy yellow color urine with small hemoglobin, moderate leukocytes, positive nitrite, many bacteria UDS negative Chest x-ray negative for any acute infiltrates X-ray left ankle showed acute mildly displaced distal fibular shaft fracture, acute mildly displaced medial malleolar fracture, mild posterior subluxation of the talar dome with respect to the distal tibia. EDP discussed with orthopedics Dr. Sherida.  Subjective: Patient was seen and examined this morning. Lying on bed.  Awake, alert, slow to respond.  Answers with few words only Family not at bedside Chart reviewed Remains afebrile, hemodynamically stable Labs from this morning with WBC count normal, hemoglobin 12.2, potassium low at 3.3  Assessment and plan: Left ankle trimalleolar fracture  Secondary to a fall 5 weeks ago. Imaging as above. Orthopedics consulted. Surgical intervention likely Pain regimen: Tylenol  as needed, oxycodone  as  needed Bowel regimen: MiraLAX  as needed Orthopedics consulted  Hypokalemia Potassium low at 3.3.  Replacement ordered Recent Labs  Lab 06/25/23 1441 06/26/23 0006  K 3.6 3.3*   H/o prostate cancer Flomax  resumed  H/o back surgery Wheelchair-bound status  Neurogenic bladder Self catheterizes every 3-4 hours  Abnormal urinalysis Likely related to chronic self-catheterization. No clear evidence of infection. Hold off antibiotics for now   Fecal incontinence Since back surgery    Schizophrenia Home med list still not complete   Home med list has not been obtained/updated by [employer] at the time of admission.  Please double check admission reconciliation.    Mobility: Chronically bedbound??  Goals of care   Code Status: Limited: Do not attempt resuscitation (DNR) -DNR-LIMITED -Do Not Intubate/DNI      DVT prophylaxis:  heparin  injection 5,000 Units Start: 06/26/23 1400 SCDs Start: 06/25/23 2303   Antimicrobials: None Fluid: None Consultants: Orthopedics Family Communication: None at beside  Status: Observation Level of care:  Med-Surg   Patient is from: Home Needs to continue in-hospital care: Needs procedure Anticipated d/c to: Pending clinical course   Diet:  Diet Order             Diet regular Room service appropriate? Yes; Fluid consistency: Thin  Diet effective now                   Scheduled Meds:  heparin   5,000 Units Subcutaneous Q8H   oxyCODONE   10 mg Oral Once   potassium chloride   40 mEq Oral Once   tamsulosin   0.4 mg Oral Daily    PRN meds: acetaminophen  **OR** acetaminophen , alum & mag hydroxide-simeth, labetalol , melatonin, ondansetron  **OR** ondansetron  (ZOFRAN ) IV, oxyCODONE   Infusions:    Antimicrobials: Anti-infectives (From admission, onward)    Start     Dose/Rate Route Frequency Ordered Stop   06/25/23 1630  cefTRIAXone  (ROCEPHIN ) 1 g in sodium chloride  0.9 % 100 mL IVPB        1 g 200 mL/hr over 30 Minutes  Intravenous  Once 06/25/23 1621 06/25/23 1827       Objective: Vitals:   06/26/23 0500 06/26/23 0804  BP: 100/62 112/70  Pulse: 79 84  Resp: 17 18  Temp: 98 F (36.7 C) 99.3 F (37.4 C)  SpO2: 95% 96%    Intake/Output Summary (Last 24 hours) at 06/26/2023 1434 Last data filed at 06/26/2023 1300 Gross per 24 hour  Intake 340 ml  Output 300 ml  Net 40 ml   Filed Weights   06/25/23 1430  Weight: 124 kg   Weight change:  Body mass index is 35.1 kg/m.   Physical Exam: General exam: Pleasant, elderly African-American male Skin: No rashes, lesions or ulcers. HEENT: Atraumatic, normocephalic, no obvious bleeding Lungs: Clear to auscultation bilaterally,  CVS: S1, S2, no murmur,   GI/Abd: Soft, nontender, nondistended, bowel sound present,   CNS: Alert, awake, slow to respond, answers with few words only Psychiatry: Mood appropriate Extremities: Bilateral chronic lower extremity lymphedema, no calf tenderness, left lower extremity on bandage  Data Review: I have personally reviewed the laboratory data and studies available.  F/u labs  Unresulted Labs (From admission, onward)    None       Total time spent in review of labs and imaging, patient evaluation, formulation of plan, documentation and communication with family: 45 minutes  Signed, Chapman Rota, MD [facility] 06/26/2023

## 2023-06-26 NOTE — Consult Note (Signed)
 Reason for Consult:Left ankle fx Referring Physician: Chapman Dahal Time called: [id number] Time at bedside: 0918   Timothy [name] is an 68 y.o. male.  HPI: Humbert fell trying to open a RW. He had immediate left ankle pain and could not get up. He was brought to the ED where x-rays showed an ankle fx. He was reduced in the ED and orthopedic surgery was consulted. The patient is a poor historian and unsure any of what he told me is actually true.  Past Medical History:  Diagnosis Date   Behavioral disorder    Cancer [facility])    prostate cancer & cancer in his spine   Chronic back pain    Constipation    Decreased mobility    uses wheelchair, spinal cancer   Gonorrhea    Impaired mobility    prostate cancer cancer in his spine led to surgery and some decrease use of his lower extremities, neurogenic bladder   Intermittent self-catheterization of bladder    Neurogenic bladder    Paranoid schizophrenia (HCC)    PTSD (post-traumatic stress disorder)     Past Surgical History:  Procedure Laterality Date   BACK SURGERY      Family History  Family history unknown: Yes    Social History:  reports that he has quit smoking. His smoking use included cigarettes. He has never used smokeless tobacco. He reports that he does not drink alcohol  and does not use drugs.  Allergies:  Allergies  Allergen Reactions   Penicillins Anaphylaxis    Has patient had a PCN reaction causing immediate rash, facial/tongue/throat swelling, SOB or lightheadedness with hypotension: Yes Has patient had a PCN reaction causing severe rash involving mucus membranes or skin necrosis: No Has patient had a PCN reaction that required hospitalization: Yes Has patient had a PCN reaction occurring within the last 10 years: No If all of the above answers are NO, then may proceed with Cephalosporin use.     Medications: I have reviewed the patient's current medications.  Results for orders placed or performed during  the hospital encounter of 06/25/23 (from the past 48 hours)  Culture, blood (Routine x 2)     Status: None (Preliminary result)   Collection Time: 06/25/23  2:33 PM   Specimen: BLOOD RIGHT ARM  Result Value Ref Range   Specimen Description BLOOD RIGHT ARM    Special Requests      BOTTLES DRAWN AEROBIC AND ANAEROBIC Blood Culture results may not be optimal due to an inadequate volume of blood received in culture bottles   Culture      NO GROWTH < 24 HOURS Performed at [facility], [address], [place], [place] [address]    Report Status PENDING   Comprehensive metabolic panel     Status: Abnormal   Collection Time: 06/25/23  2:41 PM  Result Value Ref Range   Sodium 139 135 - 145 mmol/L   Potassium 3.6 3.5 - 5.1 mmol/L   Chloride 103 98 - 111 mmol/L   CO2 24 22 - 32 mmol/L   Glucose, Bld 104 (H) 70 - 99 mg/dL    Comment: Glucose reference range applies only to samples taken after fasting for at least 8 hours.   BUN 10 8 - 23 mg/dL   Creatinine, Ser 8.97 0.61 - 1.24 mg/dL   Calcium  8.5 (L) 8.9 - 10.3 mg/dL   Total Protein 6.4 (L) 6.5 - 8.1 g/dL   Albumin  3.0 (L) 3.5 - 5.0 g/dL  AST 39 15 - 41 U/L   ALT 24 0 - 44 U/L   Alkaline Phosphatase 53 38 - 126 U/L   Total Bilirubin 0.8 0.0 - 1.2 mg/dL   GFR, Estimated >39 >39 mL/min    Comment: (NOTE) Calculated using the CKD-EPI Creatinine Equation (2021)    Anion gap 12 5 - 15    Comment: Performed at [facility] Lab, [address] [place], [place] [address]  CBC with Differential     Status: None   Collection Time: 06/25/23  2:41 PM  Result Value Ref Range   WBC 6.0 4.0 - 10.5 K/uL   RBC 4.75 4.22 - 5.81 MIL/uL   Hemoglobin 14.2 13.0 - 17.0 g/dL   HCT 57.8 60.9 - 47.9 %   MCV 88.6 80.0 - 100.0 fL   MCH 29.9 26.0 - 34.0 pg   MCHC 33.7 30.0 - 36.0 g/dL   RDW 87.3 88.4 - 84.4 %   Platelets 189 150 - 400 K/uL   nRBC 0.0 0.0 - 0.2 %   Neutrophils Relative % 83 %   Neutro Abs 4.9 1.7 - 7.7 K/uL   Lymphocytes  Relative 12 %   Lymphs Abs 0.7 0.7 - 4.0 K/uL   Monocytes Relative 5 %   Monocytes Absolute 0.3 0.1 - 1.0 K/uL   Eosinophils Relative 0 %   Eosinophils Absolute 0.0 0.0 - 0.5 K/uL   Basophils Relative 0 %   Basophils Absolute 0.0 0.0 - 0.1 K/uL   Immature Granulocytes 0 %   Abs Immature Granulocytes 0.02 0.00 - 0.07 K/uL    Comment: Performed at [facility], [address], [place], [place] [address]  Protime-INR     Status: None   Collection Time: 06/25/23  2:41 PM  Result Value Ref Range   Prothrombin Time 13.8 11.4 - 15.2 seconds   INR 1.0 0.8 - 1.2    Comment: (NOTE) INR goal varies based on device and disease states. Performed at [facility], [address] [place], [place] [address]   Brain natriuretic peptide     Status: None   Collection Time: 06/25/23  2:41 PM  Result Value Ref Range   B Natriuretic Peptide 66.8 0.0 - 100.0 pg/mL    Comment: Performed at [facility], [address] [place], [place] [address]  I-Stat Lactic Acid, ED     Status: None   Collection Time: 06/25/23  2:47 PM  Result Value Ref Range   Lactic Acid, Venous 1.1 0.5 - 1.9 mmol/L  Urinalysis, w/ Reflex to Culture (Infection Suspected) -Urine, Clean Catch     Status: Abnormal   Collection Time: 06/25/23  4:20 PM  Result Value Ref Range   Specimen Source URINE, CATHETERIZED    Color, Urine YELLOW YELLOW   APPearance HAZY (A) CLEAR   Specific Gravity, Urine 1.020 1.005 - 1.030   pH 6.0 5.0 - 8.0   Glucose, UA NEGATIVE NEGATIVE mg/dL   Hgb urine dipstick SMALL (A) NEGATIVE   Bilirubin Urine NEGATIVE NEGATIVE   Ketones, ur 40 (A) NEGATIVE mg/dL   Protein, ur NEGATIVE NEGATIVE mg/dL   Nitrite POSITIVE (A) NEGATIVE   Leukocytes,Ua MODERATE (A) NEGATIVE   Squamous Epithelial / HPF 0-5 0 - 5 /HPF   WBC, UA 6-10 0 - 5 WBC/hpf    Comment: Reflex urine culture not performed if WBC <=10, OR if Squamous epithelial cells >5. If Squamous epithelial cells >5, suggest recollection.  RBC / HPF 0-5 0 - 5 RBC/hpf   Bacteria, UA MANY (A) NONE SEEN    Comment: Performed at [facility], [address], [place], [place] [address]  Ethanol     Status: None   Collection Time: 06/25/23  4:20 PM  Result Value Ref Range   Alcohol , Ethyl (B) <10 <10 mg/dL    Comment: (NOTE) Lowest detectable limit for serum alcohol  is 10 mg/dL.  For medical purposes only. Performed at [facility], [address] [place], [place] [address]   Rapid urine drug screen (hospital performed)     Status: None   Collection Time: 06/25/23  4:28 PM  Result Value Ref Range   Opiates NONE DETECTED NONE DETECTED   Cocaine NONE DETECTED NONE DETECTED   Benzodiazepines NONE DETECTED NONE DETECTED   Amphetamines NONE DETECTED NONE DETECTED   Tetrahydrocannabinol NONE DETECTED NONE DETECTED   Barbiturates NONE DETECTED NONE DETECTED    Comment: (NOTE) DRUG SCREEN FOR MEDICAL PURPOSES ONLY.  IF CONFIRMATION IS NEEDED FOR ANY PURPOSE, NOTIFY LAB WITHIN 5 DAYS.  LOWEST DETECTABLE LIMITS FOR URINE DRUG SCREEN Drug Class                     Cutoff (ng/mL) Amphetamine and metabolites    1000 Barbiturate and metabolites    200 Benzodiazepine                 200 Opiates and metabolites        300 Cocaine and metabolites        300 THC                            50 Performed at [facility], [address] [place], [place] [address]   Culture, blood (Routine x 2)     Status: None (Preliminary result)   Collection Time: 06/25/23 10:57 PM   Specimen: BLOOD RIGHT ARM  Result Value Ref Range   Specimen Description BLOOD RIGHT ARM    Special Requests      BOTTLES DRAWN AEROBIC AND ANAEROBIC Blood Culture adequate volume   Culture      NO GROWTH < 12 HOURS Performed at [facility], [address], [place], [place] [address]    Report Status PENDING   Surgical pcr screen     Status: None   Collection Time: 06/25/23 11:28 PM   Specimen: Nasal Mucosa; Nasal Swab  Result Value  Ref Range   MRSA, PCR NEGATIVE NEGATIVE   Staphylococcus aureus NEGATIVE NEGATIVE    Comment: (NOTE) The Xpert SA Assay (FDA approved for NASAL specimens in patients 50 years of age and older), is one component of a comprehensive surveillance program. It is not intended to diagnose infection nor to guide or monitor treatment. Performed at [facility], [address] [place], [place] [address]   HIV Antibody (routine testing w rflx)     Status: None   Collection Time: 06/26/23 12:06 AM  Result Value Ref Range   HIV Screen 4th Generation wRfx Non Reactive Non Reactive    Comment: Performed at [facility], [address] [place], [place] [address]  Comprehensive metabolic panel     Status: Abnormal   Collection Time: 06/26/23 12:06 AM  Result Value Ref Range   Sodium 139 135 - 145 mmol/L   Potassium 3.3 (L) 3.5 - 5.1 mmol/L  Chloride 102 98 - 111 mmol/L   CO2 26 22 - 32 mmol/L   Glucose, Bld 121 (H) 70 - 99 mg/dL    Comment: Glucose reference range applies only to samples taken after fasting for at least 8 hours.   BUN 11 8 - 23 mg/dL   Creatinine, Ser 8.87 0.61 - 1.24 mg/dL   Calcium  8.2 (L) 8.9 - 10.3 mg/dL   Total Protein 5.6 (L) 6.5 - 8.1 g/dL   Albumin  2.5 (L) 3.5 - 5.0 g/dL   AST 39 15 - 41 U/L   ALT 27 0 - 44 U/L   Alkaline Phosphatase 44 38 - 126 U/L   Total Bilirubin 0.8 0.0 - 1.2 mg/dL   GFR, Estimated >39 >39 mL/min    Comment: (NOTE) Calculated using the CKD-EPI Creatinine Equation (2021)    Anion gap 11 5 - 15    Comment: Performed at [facility] Lab, [address], [place], [place] [address]  CBC with Differential/Platelet     Status: Abnormal   Collection Time: 06/26/23 12:06 AM  Result Value Ref Range   WBC 4.5 4.0 - 10.5 K/uL   RBC 4.03 (L) 4.22 - 5.81 MIL/uL   Hemoglobin 12.2 (L) 13.0 - 17.0 g/dL   HCT 65.3 (L) 60.9 - 47.9 %   MCV 85.9 80.0 - 100.0 fL   MCH 30.3 26.0 - 34.0 pg   MCHC 35.3 30.0 - 36.0 g/dL   RDW 87.4 88.4 - 84.4 %    Platelets 168 150 - 400 K/uL   nRBC 0.0 0.0 - 0.2 %   Neutrophils Relative % 75 %   Neutro Abs 3.3 1.7 - 7.7 K/uL   Lymphocytes Relative 17 %   Lymphs Abs 0.8 0.7 - 4.0 K/uL   Monocytes Relative 8 %   Monocytes Absolute 0.4 0.1 - 1.0 K/uL   Eosinophils Relative 0 %   Eosinophils Absolute 0.0 0.0 - 0.5 K/uL   Basophils Relative 0 %   Basophils Absolute 0.0 0.0 - 0.1 K/uL   Immature Granulocytes 0 %   Abs Immature Granulocytes 0.01 0.00 - 0.07 K/uL    Comment: Performed at [facility], [address] [place], [place] [address]    CT Ankle Left Wo Contrast Result Date: 06/26/2023 CLINICAL DATA:  Ankle fracture, postreduction. EXAM: CT OF THE LEFT ANKLE WITHOUT CONTRAST TECHNIQUE: Multidetector CT imaging of the left ankle was performed according to the standard protocol. Multiplanar CT image reconstructions were also generated. RADIATION DOSE REDUCTION: This exam was performed according to the departmental dose-optimization program which includes automated exposure control, adjustment of the mA and/or kV according to patient size and/or use of iterative reconstruction technique. COMPARISON:  Pre and post reduction left ankle series earlier today. FINDINGS: Bones/Joint/Cartilage There is mild osteopenia. This is more notable in the juxta-articular areas in the ankle and midfoot than elsewhere. Trimalleolar fracture. There is a longitudinal nondisplaced linear posterior malleolar fracture. Transverse oblique medial malleolar fracture is displaced about 1 cortex posteriorly and laterally with slight distraction of the anterior fracture margin. Distal fibular fracture extends through the lower diametaphysis with a spiral fracture and butterfly comminution fragment posteriorly. The main distal fragment displaced laterally by 1 cortex width. The butterfly fragment only minimally dorsally distracted. The lower fracture margin of the fibular fracture is through the level the distal tibiofibular  syndesmosis above the level of the tibial plafond. There is less widening of the anterior joint space than previously with no evidence of  dislocation. There is mild midfoot nonerosive arthrosis and mild to moderate nonerosive arthrosis of the ankle joint. In the visualized portion of the foot, there is also an accessory navicular bone and a bony bridge between the proximal shafts of the third and fourth metatarsals. Ligaments Suboptimally assessed by CT. Muscles and Tendons There is mild atrophy of the intrinsic foreleg musculature. More advanced atrophy of the intrinsic plantar foot muscles. No intramuscular hematoma is seen. Area tendons are grossly intact but not optimally studied with CT. Soft tissues Diffuse edema extends over the mid to distal foreleg and ankle and into the foot. No space-occupying hematoma or mass. There are dystrophic calcifications medial to the medial malleolus which are nonspecific but could be due to remote trauma or remote infection. No vascular calcifications are seen. There is no soft tissue gas. No visible ulcerations or defects in the skin. IMPRESSION: 1. Trimalleolar fracture with less widening of the anterior joint space than previously. 2. Osteopenia and degenerative change. 3. Diffuse edema over the mid to distal foreleg and ankle and into the foot. No space-occupying hematoma or mass. 4. Muscle atrophy. 5. Dystrophic calcifications medial to the medial malleolus which are nonspecific but could be due to remote trauma or remote infection. Electronically Signed   By: Francis Quam M.D.   On: 06/26/2023 00:36   DG Ankle Left Port Result Date: 06/25/2023 CLINICAL DATA:  Ankle fracture EXAM: PORTABLE LEFT ANKLE - 2 VIEW COMPARISON:  06/25/2023 FINDINGS: Casting material limits osseous detail. Displaced distal fibular and medial malleolar fractures are again noted. Decreased posterior subluxation of the talus but with residual widening of the anterior joint space IMPRESSION:  Casting material limits osseous detail. Displaced distal fibular and medial malleolar fractures. Decreased posterior subluxation of the talus but with residual widening of the anterior joint space. Electronically Signed   By: Luke Bun M.D.   On: 06/25/2023 19:16   CT Head Wo Contrast Result Date: 06/25/2023 CLINICAL DATA:  Mental status change EXAM: CT HEAD WITHOUT CONTRAST TECHNIQUE: Contiguous axial images were obtained from the base of the skull through the vertex without intravenous contrast. RADIATION DOSE REDUCTION: This exam was performed according to the departmental dose-optimization program which includes automated exposure control, adjustment of the mA and/or kV according to patient size and/or use of iterative reconstruction technique. COMPARISON:  Head CT 08/17/2021 FINDINGS: Brain: No evidence of acute infarction, hemorrhage, hydrocephalus, extra-axial collection or mass lesion/mass effect. Vascular: No hyperdense vessel or unexpected calcification. Skull: Normal. Negative for fracture or focal lesion. Sinuses/Orbits: There is mucosal thickening of the paranasal sinuses diffusely. There is a small air-fluid level in the right maxillary sinus. Mastoid air cells are clear. Orbits are within normal limits. Other: None. IMPRESSION: 1. No acute intracranial process. 2. Paranasal sinus disease. Electronically Signed   By: Greig Pique M.D.   On: 06/25/2023 17:29   VAS US  LOWER EXTREMITY VENOUS (DVT) (7a-7p) Result Date: 06/25/2023  Lower Venous DVT Study Patient Name:  KEITHON MCCOIN  Date of Exam:   06/25/2023 Medical Rec #: [id number]         Accession #:    [id number] Date of Birth: May 17, 1956         Patient Gender: M Patient Age:   67 years Exam Location:  [facility] Procedure:      VAS US  LOWER EXTREMITY VENOUS (DVT) Referring Phys: LYNWOOD TEMPLETON --------------------------------------------------------------------------------  Indications: Swelling, and Edema.  Comparison Study:  Previous study on 5.7.2024. Performing Technologist: Edilia Elden Appl  Examination Guidelines: A complete evaluation includes B-mode imaging, spectral Doppler, color Doppler, and power Doppler as needed of all accessible portions of each vessel. Bilateral testing is considered an integral part of a complete examination. Limited examinations for reoccurring indications may be performed as noted. The reflux portion of the exam is performed with the patient in reverse Trendelenburg.  +-----+---------------+---------+-----------+----------+--------------+ RIGHTCompressibilityPhasicitySpontaneityPropertiesThrombus Aging +-----+---------------+---------+-----------+----------+--------------+ CFV  Full           Yes      Yes                                 +-----+---------------+---------+-----------+----------+--------------+ SFJ  Full           Yes      Yes                                 +-----+---------------+---------+-----------+----------+--------------+   +---------+---------------+---------+-----------+----------+-------------------+ LEFT     CompressibilityPhasicitySpontaneityPropertiesThrombus Aging      +---------+---------------+---------+-----------+----------+-------------------+ CFV      Full           Yes      Yes                                      +---------+---------------+---------+-----------+----------+-------------------+ SFJ      Full           Yes      Yes                                      +---------+---------------+---------+-----------+----------+-------------------+ FV Prox  Full                                                             +---------+---------------+---------+-----------+----------+-------------------+ FV Mid   Full                                                             +---------+---------------+---------+-----------+----------+-------------------+ FV DistalFull                                                              +---------+---------------+---------+-----------+----------+-------------------+ PFV      Full                                                             +---------+---------------+---------+-----------+----------+-------------------+ POP      Full           Yes      Yes                                      +---------+---------------+---------+-----------+----------+-------------------+  PTV                                                   Limited, patent by                                                        color.              +---------+---------------+---------+-----------+----------+-------------------+ PERO                                                  Limited, patent by                                                        color.              +---------+---------------+---------+-----------+----------+-------------------+    Summary: RIGHT: - No evidence of common femoral vein obstruction.   LEFT: - There is no evidence of deep vein thrombosis in the lower extremity.  - No cystic structure found in the popliteal fossa.  *See table(s) above for measurements and observations.    Preliminary    DG Ankle Complete Left Result Date: 06/25/2023 CLINICAL DATA:  Ankle pain and swelling EXAM: LEFT ANKLE COMPLETE - 3+ VIEW COMPARISON:  None Available. FINDINGS: Acute appearing fracture involving the distal shaft of the fibula with a bowel 1/4 shaft diameter lateral and posterior displacement of distal fracture fragment. Acute mildly displaced medial malleolar fracture. Mild posterior subluxation of the talar dome with respect to the distal tibia. Diffuse soft tissue swelling. IMPRESSION: Acute mildly displaced distal fibular shaft fracture. Acute mildly displaced medial malleolar fracture. Mild posterior subluxation of the talar dome with respect to the distal tibia. Electronically Signed   By: Luke Bun M.D.   On: 06/25/2023 16:03   DG Chest 2  View Result Date: 06/25/2023 CLINICAL DATA:  Suspected sepsis ankle swelling EXAM: CHEST - 2 VIEW COMPARISON:  01/19/2023 FINDINGS: Normal cardiac size. No significant effusion. No focal consolidation. Question nodular opacity at the posterior lung base on 1 of the 2 lateral images. IMPRESSION: No active cardiopulmonary disease. Question nodular opacity at the posterior lung base on 1 of the 2 lateral images. Consider further assessment with chest CT Electronically Signed   By: Luke Bun M.D.   On: 06/25/2023 16:00    Review of Systems  HENT:  Negative for ear discharge, ear pain, hearing loss and tinnitus.   Eyes:  Negative for photophobia and pain.  Respiratory:  Negative for cough and shortness of breath.   Cardiovascular:  Negative for chest pain.  Gastrointestinal:  Negative for abdominal pain, nausea and vomiting.  Genitourinary:  Negative for dysuria, flank pain, frequency and urgency.  Musculoskeletal:  Positive for arthralgias (Left ankle). Negative for back pain, myalgias and neck pain.  Neurological:  Negative for dizziness and headaches.  Hematological:  Does  not bruise/bleed easily.  Psychiatric/Behavioral:  The patient is not nervous/anxious.    Blood pressure 112/70, pulse 84, temperature 99.3 F (37.4 C), temperature source Oral, resp. rate 18, weight 124 kg, SpO2 96%. Physical Exam Constitutional:      General: He is not in acute distress.    Appearance: He is well-developed. He is not diaphoretic.  HENT:     Head: Normocephalic and atraumatic.  Eyes:     General: No scleral icterus.       Right eye: No discharge.        Left eye: No discharge.     Conjunctiva/sclera: Conjunctivae normal.  Cardiovascular:     Rate and Rhythm: Normal rate and regular rhythm.  Pulmonary:     Effort: Pulmonary effort is normal. No respiratory distress.  Musculoskeletal:     Cervical back: Normal range of motion.     Comments: LLE No traumatic wounds or rash. Extensive ecchymosis  and edema ankle. Short leg splint in place.  Mod TTP  No knee effusion  Knee stable to varus/ valgus and anterior/posterior stress  Sens DPN, SPN, TN intact  Motor EHL 5/5  DP 2+, 1+ pedal edema  Skin:    General: Skin is warm and dry.  Neurological:     Mental Status: He is alert.  Psychiatric:        Mood and Affect: Mood normal.        Behavior: Behavior normal.     Assessment/Plan: Left ankle fx -- Will work on edema control today with elevation and ice. If swelling acceptable will plan on ORIF this afternoon with Dr. Kendal. [place] will plan on next week. Please keep NPO for now. Multiple medical problems including schizophrenia, chronic neurogenic bladder with a history of self catheterizations, chronic fecal incontinence, history of chronic back pain -- per primary service    Ozell DOROTHA Ned, PA-C Orthopedic Surgery [phone] 06/26/2023, 9:35 AM

## 2023-06-26 NOTE — Care Management Obs Status (Signed)
 MEDICARE OBSERVATION STATUS NOTIFICATION   Patient Details  Name: Timothy [name] MRN: [id number] Date of Birth: [birth year]   Medicare Observation Status Notification Given:  Yes (by mail)  Patient is confused and unable to discuss.  No family listed as emergency contact, only a friend. Discussed with [facility] supervisor Josefa Jes, who advised attempting to call emergency contact and if contact is not appropriate as a representative, to mail the MOON letter to patient home address.  CSW called emergency contact Garrel Mealing, spoke to his wife as Garrel was not in.  She reports Garrel knows the patient as his prior attorney, however, they do not have regular contact with him and are not his representative.  Moon letter will be mailed to pt home address.   Bridget Cordella Simmonds, LCSW 06/26/2023, 2:44 PM

## 2023-06-26 NOTE — Progress Notes (Signed)
 OT Cancellation Note  Patient Details Name: Timothy [name] MRN: [id number] DOB: May 22, 1955   Cancelled Treatment:    Reason Eval/Treat Not Completed: Other (comment)- per notes plan for possible ORIF this afternoon.  OT will see post op.  Will follow.   Etta NOVAK, OT Acute Rehabilitation Services Office [phone]   Etta GORMAN Hope 06/26/2023, 11:49 AM

## 2023-06-26 NOTE — Care Management Obs Status (Signed)
 MEDICARE OBSERVATION STATUS NOTIFICATION   Patient Details  Name: Timothy [name] MRN: [id number] Date of Birth: [birth year]   Medicare Observation Status Notification Given:  Yes (by mail)  Certified letter mailed  Claretta Deed 06/26/2023, 3:23 PM

## 2023-06-26 NOTE — Progress Notes (Signed)
 PT Cancellation Note  Patient Details Name: DEAVEON SCHOEN MRN: [id number] DOB: 06-27-1955   Cancelled Treatment:    Reason Eval/Treat Not Completed: Other (comment) Pt declining PT evaluation due to pain; also awaiting ortho tech to re-wrap LLE.   Aleck Daring, PT, DPT Acute Rehabilitation Services Office [phone]    Alayne ONEIDA Daring 06/26/2023, 10:38 AM

## 2023-06-27 DIAGNOSIS — F209 Schizophrenia, unspecified: Secondary | ICD-10-CM | POA: Diagnosis not present

## 2023-06-27 DIAGNOSIS — I10 Essential (primary) hypertension: Secondary | ICD-10-CM | POA: Diagnosis not present

## 2023-06-27 DIAGNOSIS — R159 Full incontinence of feces: Secondary | ICD-10-CM | POA: Diagnosis present

## 2023-06-27 DIAGNOSIS — S82852A Displaced trimalleolar fracture of left lower leg, initial encounter for closed fracture: Secondary | ICD-10-CM | POA: Diagnosis not present

## 2023-06-27 DIAGNOSIS — C7951 Secondary malignant neoplasm of bone: Secondary | ICD-10-CM | POA: Diagnosis present

## 2023-06-27 DIAGNOSIS — R4182 Altered mental status, unspecified: Secondary | ICD-10-CM | POA: Diagnosis not present

## 2023-06-27 DIAGNOSIS — Z7982 Long term (current) use of aspirin: Secondary | ICD-10-CM | POA: Diagnosis not present

## 2023-06-27 DIAGNOSIS — Z789 Other specified health status: Secondary | ICD-10-CM | POA: Diagnosis not present

## 2023-06-27 DIAGNOSIS — Z91148 Patient's other noncompliance with medication regimen for other reason: Secondary | ICD-10-CM | POA: Diagnosis not present

## 2023-06-27 DIAGNOSIS — Z743 Need for continuous supervision: Secondary | ICD-10-CM | POA: Diagnosis not present

## 2023-06-27 DIAGNOSIS — S93432A Sprain of tibiofibular ligament of left ankle, initial encounter: Secondary | ICD-10-CM | POA: Diagnosis not present

## 2023-06-27 DIAGNOSIS — E66812 Obesity, class 2: Secondary | ICD-10-CM | POA: Diagnosis not present

## 2023-06-27 DIAGNOSIS — T443X6A Underdosing of other parasympatholytics [anticholinergics and antimuscarinics] and spasmolytics, initial encounter: Secondary | ICD-10-CM | POA: Diagnosis present

## 2023-06-27 DIAGNOSIS — W19XXXA Unspecified fall, initial encounter: Secondary | ICD-10-CM | POA: Diagnosis present

## 2023-06-27 DIAGNOSIS — Z880 Allergy status to penicillin: Secondary | ICD-10-CM | POA: Diagnosis not present

## 2023-06-27 DIAGNOSIS — E876 Hypokalemia: Secondary | ICD-10-CM | POA: Diagnosis present

## 2023-06-27 DIAGNOSIS — L03116 Cellulitis of left lower limb: Secondary | ICD-10-CM | POA: Diagnosis present

## 2023-06-27 DIAGNOSIS — T43226A Underdosing of selective serotonin reuptake inhibitors, initial encounter: Secondary | ICD-10-CM | POA: Diagnosis present

## 2023-06-27 DIAGNOSIS — F17290 Nicotine dependence, other tobacco product, uncomplicated: Secondary | ICD-10-CM | POA: Diagnosis present

## 2023-06-27 DIAGNOSIS — Z751 Person awaiting admission to adequate facility elsewhere: Secondary | ICD-10-CM | POA: Diagnosis not present

## 2023-06-27 DIAGNOSIS — F4310 Post-traumatic stress disorder, unspecified: Secondary | ICD-10-CM | POA: Diagnosis present

## 2023-06-27 DIAGNOSIS — N319 Neuromuscular dysfunction of bladder, unspecified: Secondary | ICD-10-CM | POA: Diagnosis not present

## 2023-06-27 DIAGNOSIS — Z79899 Other long term (current) drug therapy: Secondary | ICD-10-CM | POA: Diagnosis not present

## 2023-06-27 DIAGNOSIS — Z66 Do not resuscitate: Secondary | ICD-10-CM | POA: Diagnosis present

## 2023-06-27 DIAGNOSIS — Z6835 Body mass index (BMI) 35.0-35.9, adult: Secondary | ICD-10-CM | POA: Diagnosis not present

## 2023-06-27 DIAGNOSIS — T434X6A Underdosing of butyrophenone and thiothixene neuroleptics, initial encounter: Secondary | ICD-10-CM | POA: Diagnosis present

## 2023-06-27 DIAGNOSIS — S9302XA Subluxation of left ankle joint, initial encounter: Secondary | ICD-10-CM | POA: Diagnosis present

## 2023-06-27 DIAGNOSIS — F200 Paranoid schizophrenia: Secondary | ICD-10-CM | POA: Diagnosis present

## 2023-06-27 DIAGNOSIS — Z993 Dependence on wheelchair: Secondary | ICD-10-CM | POA: Diagnosis not present

## 2023-06-27 DIAGNOSIS — F32A Depression, unspecified: Secondary | ICD-10-CM | POA: Diagnosis present

## 2023-06-27 MED ORDER — TAMSULOSIN HCL 0.4 MG PO CAPS
0.4000 mg | ORAL_CAPSULE | Freq: Every day | ORAL | Status: DC
  Administered 2023-06-27 – 2023-07-22 (×24): 0.4 mg via ORAL
  Filled 2023-06-27 (×25): qty 1

## 2023-06-27 MED ORDER — POTASSIUM CHLORIDE CRYS ER 20 MEQ PO TBCR: 40.0000 meq | EXTENDED_RELEASE_TABLET | Freq: Once | ORAL | Status: DC

## 2023-06-27 MED ORDER — CYCLOBENZAPRINE HCL 5 MG PO TABS
5.0000 mg | ORAL_TABLET | Freq: Once | ORAL | Status: AC
  Administered 2023-06-27: 5 mg via ORAL
  Filled 2023-06-27: qty 1

## 2023-06-27 NOTE — Plan of Care (Signed)

## 2023-06-27 NOTE — Progress Notes (Signed)
 OT Cancellation Note  Patient Details Name: Timothy [name] MRN: [id number] DOB: 26-May-1955   Cancelled Treatment:    Reason Eval/Treat Not Completed: Fatigue/lethargy limiting ability to participate;Other (comment). Per RN pt had pain meds this morning due to significant pain. OT will follow up next available time   Jacques Karna Loose 06/27/2023, 12:59 PM

## 2023-06-27 NOTE — Progress Notes (Signed)
 PROGRESS NOTE  Timothy [name]  DOB: 1956-04-17  PCP: [name], MD FMW:[id number]  DOA: 06/25/2023  LOS: 0 days  Hospital Day: 3  Brief narrative: Timothy [name] is a 68 y.o. male with PMH significant for prostate cancer with vertebral mets, s/p back surgery, wheelchair-bound status, neurogenic bladder requiring self cath, chronic fecal incontinence, gonorrhea, schizophrenia, PTSD, behavioral disturbance who follows up at [facility] [place]. 2/6, patient presented to ED with complaint of bilateral leg swelling He states it has been present for months but became much worse yesterday redness and swelling noted left more than right. Patient was apparently seen in the ED on January 4 for a fall.  He underwent x-ray of left knee at that time which was unremarkable and was discharged.  It seems he did not have ankle x-ray done at that time.  Patient reports pain and swelling since that fall.  In the ED, patient was hemodynamically stable CBC/CMP mostly unremarkable Catheter urinalysis showed hazy yellow color urine with small hemoglobin, moderate leukocytes, positive nitrite, many bacteria UDS negative Chest x-ray negative for any acute infiltrates X-ray left ankle showed acute mildly displaced distal fibular shaft fracture, acute mildly displaced medial malleolar fracture, mild posterior subluxation of the talar dome with respect to the distal tibia. EDP discussed with orthopedics Dr. Sherida.  Subjective: Patient was seen and examined this morning. Lying on bed.  Not in distress.  Pain controlled. Patient's sister Ms. Laverne was at bedside.  She says he has been taking care of him for several years.  He does not have any legal/medical power of attorney but she has been helping him with the decisions. Tmax 100.4 this morning  Assessment and plan: Left ankle trimalleolar fracture  Secondary to a fall 5 weeks ago. Imaging as above. Orthopedics consulted. Surgical intervention likely Pain  regimen: Tylenol  as needed, oxycodone  as needed Bowel regimen: MiraLAX  as needed Orthopedics consulted  Hypokalemia Potassium replacement given yesterday.  Repeat labs tomorrow Recent Labs  Lab 06/25/23 1441 06/26/23 0006  K 3.6 3.3*   H/o prostate cancer Flomax  to continue  Neurogenic bladder Self catheterizes every 3-4 hours  Abnormal urinalysis Likely related to chronic self-catheterization. No clear evidence of infection. Had a temperature of 100.4 this morning.  No clear fever. Hold off antibiotics for now   H/o back surgery Wheelchair-bound status   Fecal incontinence Since back surgery    Schizophrenia Home med list still not complete.  Sister to bring the list today.   Mobility: Chronically bedbound??  Goals of care   Code Status: Limited: Do not attempt resuscitation (DNR) -DNR-LIMITED -Do Not Intubate/DNI      DVT prophylaxis:  heparin  injection 5,000 Units Start: 06/26/23 1400 SCDs Start: 06/25/23 2303   Antimicrobials: None Fluid: None Consultants: Orthopedics Family Communication: None at beside  Status: Observation Level of care:  Med-Surg   Patient is from: Home Needs to continue in-hospital care: Needs procedure Anticipated d/c to: Pending clinical course   Diet:  Diet Order             Diet regular Room service appropriate? Yes; Fluid consistency: Thin  Diet effective now                   Scheduled Meds:  heparin   5,000 Units Subcutaneous Q8H   oxyCODONE   10 mg Oral Once   tamsulosin   0.4 mg Oral Daily    PRN meds: acetaminophen  **OR** acetaminophen , alum & mag hydroxide-simeth, labetalol , melatonin, ondansetron  **OR** ondansetron  (ZOFRAN )  IV, oxyCODONE    Infusions:    Antimicrobials: Anti-infectives (From admission, onward)    Start     Dose/Rate Route Frequency Ordered Stop   06/25/23 1630  cefTRIAXone  (ROCEPHIN ) 1 g in sodium chloride  0.9 % 100 mL IVPB        1 g 200 mL/hr over 30 Minutes Intravenous  Once  06/25/23 1621 06/25/23 1827       Objective: Vitals:   06/27/23 0400 06/27/23 0927  BP: 112/65 (!) 120/56  Pulse: 84 89  Resp: 18 20  Temp: 98.3 F (36.8 C) 98.4 F (36.9 C)  SpO2: 93% 93%    Intake/Output Summary (Last 24 hours) at 06/27/2023 1124 Last data filed at 06/27/2023 0500 Gross per 24 hour  Intake 960 ml  Output 950 ml  Net 10 ml   Filed Weights   06/25/23 1430  Weight: 124 kg   Weight change:  Body mass index is 35.1 kg/m.   Physical Exam: General exam: Pleasant, elderly African-American male Skin: No rashes, lesions or ulcers. HEENT: Atraumatic, normocephalic, no obvious bleeding Lungs: Clear to auscultation bilaterally,  CVS: S1, S2, no murmur,   GI/Abd: Soft, nontender, nondistended, bowel sound present,   CNS: Alert, awake, slow to respond, answers with few words only Psychiatry: Mood appropriate Extremities: Bilateral chronic lower extremity lymphedema, no calf tenderness, left lower extremity on bandage  Data Review: I have personally reviewed the laboratory data and studies available.  F/u labs  Unresulted Labs (From admission, onward)     Start     Ordered   06/28/23 0500  Basic metabolic panel  Tomorrow morning,   R       Question:  Specimen collection method  Answer:  Lab=Lab collect   06/27/23 1123   06/28/23 0500  CBC with Differential/Platelet  Tomorrow morning,   R       Question:  Specimen collection method  Answer:  Lab=Lab collect   06/27/23 1123            Total time spent in review of labs and imaging, patient evaluation, formulation of plan, documentation and communication with family: 45 minutes  Signed, Chapman Rota, MD [facility] 06/27/2023

## 2023-06-27 NOTE — Evaluation (Signed)
 Physical Therapy Evaluation Patient Details Name: Timothy [name] MRN: [id number] DOB: [birth year] Today's Date: 06/27/2023  History of Present Illness  68 y/o male presents to [facility] ED on 2/6 for bilateral leg swelling/pain, redness and swelling noted to L>R. Imaging findings result in trimalleolar fx of the L ankle, mildly displaced distal fibular shaft fx, mild posterior subluxation of talar dome. PMH includes history of schizophrenia, chronic neurogenic bladder with a history of self catheterizations, chronic fecal incontinence, history of chronic back pain, prostate cx w/ mets to spine   Clinical Impression  Pt in bed upon arrival and agreeable to PT eval. Pt was only oriented to self and needed multiple reminders throughout session of where he was and why he was in the hospital. Unclear prior level of mobility with conflicting pt information and chart review. Pt states he was independent for mobility with no AD, however, chart review states both that pt used a WC for mobility and ambulates with RW. Very limited PT eval as pt refused to move due to fear of pain. Pt was able to minimally move R LE while in supine, however, refused to move L LE. Readjusted pt in supine and discussed involvement in future therapy sessions. Educated pt on importance of mobility with limited pt agreement. Pt presents to therapy session with decreased strength and mobility. Pt would benefit from acute skilled PT to address functional impairments. Recommending post-acute rehab <3hrs to work towards independence with mobility. Acute PT to follow.          If plan is discharge home, recommend the following: A lot of help with walking and/or transfers;A lot of help with bathing/dressing/bathroom;Assist for transportation;Help with stairs or ramp for entrance;Assistance with cooking/housework;Direct supervision/assist for medications management;Direct supervision/assist for financial management   Can travel by private  vehicle   No    Equipment Recommendations Other (comment) (will continue to assess)     Functional Status Assessment Patient has had a recent decline in their functional status and demonstrates the ability to make significant improvements in function in a reasonable and predictable amount of time.     Precautions / Restrictions Precautions Precautions: Fall Required Braces or Orthoses: Splint/Cast Splint/Cast: Stirrup splint, Post (short leg) splint Splint/Cast - Date Prophylactic Dressing Applied (if applicable): 06/25/23 Restrictions Weight Bearing Restrictions Per Provider Order: Yes LLE Weight Bearing Per Provider Order: Non weight bearing      Mobility  Bed Mobility Overal bed mobility: Needs Assistance Bed Mobility: Rolling Rolling: Total assist      General bed mobility comments: attempted to roll to reposition, pt initiated roll with reaching with L UE. However, pt then refused to complete movement stating it will hurt. Pt declined further mobility       Balance Overall balance assessment: History of Falls (unable to assess)       Pertinent Vitals/Pain Pain Assessment Pain Assessment: Faces Pain Score: 10-Worst pain ever Pain Location: generalized Pain Descriptors / Indicators: Discomfort Pain Intervention(s): Limited activity within patient's tolerance, Monitored during session, Repositioned, Premedicated before session    Home Living Family/patient expects to be discharged to:: Private residence Living Arrangements: Other relatives (brother) Available Help at Discharge: Family;Available 24 hours/day Type of Home: House Home Access: Ramped entrance     Home Layout: One level Home Equipment: Agricultural Consultant (2 wheels);Wheelchair - manual;BSC/3in1;Grab bars - tub/shower Additional Comments: unclear if accurate history given by pt    Prior Function Prior Level of Function : Independent/Modified Independent;History of Falls (last six months);Patient poor  historian/Family not available    Mobility Comments: unclear if pt is reliable historian. According to pt, he is independent with mobility and walks with no AD. Conflicting chart review with pt being wheelchair bound with another note stating ambulatory with RW ADLs Comments: pt reports  being independent with ADLs     Extremity/Trunk Assessment   Upper Extremity Assessment Upper Extremity Assessment: Defer to OT evaluation    Lower Extremity Assessment Lower Extremity Assessment: Difficult to assess due to impaired cognition;LLE deficits/detail;RLE deficits/detail RLE Deficits / Details: pt agreeable to slightly flex R knee in supine, able to contract quad with limited AROM of ankle. Pt not agreeable to further movement or assessment RLE Sensation:  (reports no sensation in R LE, however, unclear if pt understood the task and question) LLE Deficits / Details: pt not agreeable to move L LE due to fear of pain LLE Sensation:  (reports no sensation in L LE, however, unclear if pt understood the task and question)    Cervical / Trunk Assessment Cervical / Trunk Assessment:  (not able to assess)  Communication   Communication Communication: No apparent difficulties Cueing Techniques: Verbal cues  Cognition Arousal: Alert Behavior During Therapy: Flat affect Overall Cognitive Status: Impaired/Different from baseline Area of Impairment: Orientation, Memory, Safety/judgement      Orientation Level: Disoriented to, Place, Time, Situation   Memory: Decreased short-term memory, Decreased recall of precautions   Safety/Judgement: Decreased awareness of safety, Decreased awareness of deficits     General Comments: oriented to self, repeatedly asks where he is and that he does not know why he is here despite explanation        General Comments General comments (skin integrity, edema, etc.): VSS on RA, repositioned B LE's to float heels on pillows. Discussed future therapy sessions  with pt agreeing to work with therapy in the future. Educated on importance of mobility with minimal agreement from pt     PT Assessment Patient needs continued PT services  PT Problem List Decreased strength;Decreased range of motion;Decreased activity tolerance;Decreased balance;Decreased mobility;Decreased cognition;Decreased knowledge of use of DME;Decreased safety awareness;Decreased knowledge of precautions       PT Treatment Interventions DME instruction;Gait training;Functional mobility training;Therapeutic activities;Therapeutic exercise;Balance training;Neuromuscular re-education;Wheelchair mobility training;Patient/family education    PT Goals (Current goals can be found in the Care Plan section)  Acute Rehab PT Goals Patient Stated Goal: to go home PT Goal Formulation: With patient Time For Goal Achievement: 07/11/23 Potential to Achieve Goals: Fair    Frequency Min 1X/week        AM-PAC PT 6 Clicks Mobility  Outcome Measure Help needed turning from your back to your side while in a flat bed without using bedrails?: Total Help needed moving from lying on your back to sitting on the side of a flat bed without using bedrails?: Total Help needed moving to and from a bed to a chair (including a wheelchair)?: Total Help needed standing up from a chair using your arms (e.g., wheelchair or bedside chair)?: Total Help needed to walk in hospital room?: Total Help needed climbing 3-5 steps with a railing? : Total 6 Click Score: 6    End of Session   Activity Tolerance: Other (comment) (limited by patient participation) Patient left: in bed;with call bell/phone within reach;with bed alarm set Nurse Communication: Mobility status PT Visit Diagnosis: Other abnormalities of gait and mobility (R26.89);History of falling (Z91.81);Muscle weakness (generalized) (M62.81)    Time: 8377-8359 PT Time Calculation (min) (ACUTE ONLY): 18 min  Charges:   PT Evaluation $PT Eval Low  Complexity: 1 Low   PT General Charges $$ ACUTE PT VISIT: 1 Visit       Kate ORN, PT, DPT Secure Chat Preferred  Rehab Office [phone]   Kate BRAVO Wendolyn 06/27/2023, 5:20 PM

## 2023-06-28 DIAGNOSIS — S82852A Displaced trimalleolar fracture of left lower leg, initial encounter for closed fracture: Secondary | ICD-10-CM | POA: Diagnosis not present

## 2023-06-28 LAB — BASIC METABOLIC PANEL
Anion gap: 15 (ref 5–15)
BUN: 8 mg/dL (ref 8–23)
CO2: 21 mmol/L — ABNORMAL LOW (ref 22–32)
Calcium: 8.3 mg/dL — ABNORMAL LOW (ref 8.9–10.3)
Chloride: 102 mmol/L (ref 98–111)
Creatinine, Ser: 0.93 mg/dL (ref 0.61–1.24)
GFR, Estimated: >60 mL/min (ref >60)
Glucose, Bld: 93 mg/dL (ref 70–99)
Potassium: 3.2 mmol/L — ABNORMAL LOW (ref 3.5–5.1)
Sodium: 138 mmol/L (ref 135–145)

## 2023-06-28 LAB — CBC WITH DIFFERENTIAL/PLATELET
Abs Immature Granulocytes: 0.02 10*3/uL (ref 0.00–0.07)
Basophils Absolute: 0 10*3/uL (ref 0.0–0.1)
Basophils Relative: 0 %
Eosinophils Absolute: 0.1 10*3/uL (ref 0.0–0.5)
Eosinophils Relative: 2 %
HCT: 34.6 % — ABNORMAL LOW (ref 39.0–52.0)
Hemoglobin: 12 g/dL — ABNORMAL LOW (ref 13.0–17.0)
Immature Granulocytes: 0 %
Lymphocytes Relative: 15 %
Lymphs Abs: 0.8 10*3/uL (ref 0.7–4.0)
MCH: 30.6 pg (ref 26.0–34.0)
MCHC: 34.7 g/dL (ref 30.0–36.0)
MCV: 88.3 fL (ref 80.0–100.0)
Monocytes Absolute: 0.5 10*3/uL (ref 0.1–1.0)
Monocytes Relative: 8 %
Neutro Abs: 4.1 10*3/uL (ref 1.7–7.7)
Neutrophils Relative %: 75 %
Platelets: 182 10*3/uL (ref 150–400)
RBC: 3.92 MIL/uL — ABNORMAL LOW (ref 4.22–5.81)
RDW: 12.4 % (ref 11.5–15.5)
WBC: 5.5 10*3/uL (ref 4.0–10.5)
nRBC: 0 % (ref 0.0–0.2)

## 2023-06-28 LAB — GLUCOSE, CAPILLARY
Glucose-Capillary: 89 mg/dL (ref 70–99)
Glucose-Capillary: 102 mg/dL — ABNORMAL HIGH (ref 70–99)

## 2023-06-28 MED ORDER — POTASSIUM CHLORIDE CRYS ER 20 MEQ PO TBCR
40.0000 meq | EXTENDED_RELEASE_TABLET | Freq: Once | ORAL | Status: AC
  Administered 2023-06-28: 40 meq via ORAL
  Filled 2023-06-28: qty 2

## 2023-06-28 NOTE — NC FL2 (Signed)
 [place]  MEDICAID FL2 LEVEL OF CARE FORM     IDENTIFICATION  Patient Name: Timothy [name] Birthdate: 1956-01-08 Sex: male Admission Date (Current Location): 06/25/2023  [facility] and [place] Number:  Producer, Television/film/video and Address:  [facility] [place], [address] [place], [place] [address]      Provider Number: [id number]  Attending Physician Name and Address:  Arlice Reichert, MD  Relative Name and Phone Number:  DEANNA SHED Pricilla)  [phone] (    Current Level of Care:   Recommended Level of Care: Skilled Nursing Facility Prior Approval Number:    Date Approved/Denied:   PASRR Number: screen still running  Discharge Plan: SNF    Current Diagnoses: Patient Active Problem List   Diagnosis Date Noted   Trimalleolar fracture of ankle, closed, left, initial encounter 06/25/2023   Self-catheterizes urinary bladder 06/25/2023   Schizophrenia (HCC) 06/25/2023   Neurogenic bladder 06/25/2023   Essential (primary) hypertension 06/25/2023   Backache 06/25/2023   Fecal incontinence 06/25/2023   DNR (do not resuscitate)/DNI(Do not intubate) 06/25/2023   Paranoid schizophrenia (HCC) 02/01/2016    Orientation RESPIRATION BLADDER Height & Weight     Self, Time, Situation, Place  Normal Incontinent, Internal Catheter Weight: 273 lb 5.9 oz (124 kg) Height:     BEHAVIORAL SYMPTOMS/MOOD NEUROLOGICAL BOWEL NUTRITION STATUS      Continent Diet (see DC summary)  AMBULATORY STATUS COMMUNICATION OF NEEDS Skin   Extensive Assist Verbally Normal                       Personal Care Assistance Level of Assistance  Bathing, Dressing, Feeding Bathing Assistance: Maximum assistance Feeding assistance: Limited assistance Dressing Assistance: Maximum assistance     Functional Limitations Info  Sight, Hearing, Speech Sight Info: Adequate Hearing Info: Adequate Speech Info: Adequate    SPECIAL CARE FACTORS FREQUENCY  PT (By licensed PT), OT (By  licensed OT)     PT Frequency: 5x/week OT Frequency: 5x/week            Contractures Contractures Info: Not present    Additional Factors Info  Code Status, Allergies Code Status Info: DNR Allergies Info: Penicillins  Tamsulosin            Current Medications (06/28/2023):  This is the current hospital active medication list Current Facility-Administered Medications  Medication Dose Route Frequency Provider Last Rate Last Admin   acetaminophen  (TYLENOL ) tablet 650 mg  650 mg Oral Q6H PRN Laurence Locus, DO   650 mg at 06/26/23 2148   Or   acetaminophen  (TYLENOL ) suppository 650 mg  650 mg Rectal Q6H PRN Laurence Locus, DO       alum & mag hydroxide-simeth (MAALOX/MYLANTA) 200-200-20 MG/5ML suspension 30 mL  30 mL Oral Q4H PRN Laurence Locus, DO       heparin  injection 5,000 Units  5,000 Units Subcutaneous Q8H Laurence Locus, DO   5,000 Units at 06/28/23 0522   labetalol  (NORMODYNE ) injection 10 mg  10 mg Intravenous Q4H PRN Laurence Locus, DO       melatonin tablet 10 mg  10 mg Oral QHS PRN Laurence Locus, DO   10 mg at 06/27/23 0034   ondansetron  (ZOFRAN ) tablet 4 mg  4 mg Oral Q6H PRN Laurence Locus, DO       Or   ondansetron  (ZOFRAN ) injection 4 mg  4 mg Intravenous Q6H PRN Laurence Locus, DO       oxyCODONE  (Oxy IR/ROXICODONE ) immediate release tablet 10  mg  10 mg Oral Once Laurence Locus, DO       oxyCODONE  (Oxy IR/ROXICODONE ) immediate release tablet 10 mg  10 mg Oral Q4H PRN Laurence Locus, DO   10 mg at 06/28/23 9465   tamsulosin  (FLOMAX ) capsule 0.4 mg  0.4 mg Oral Daily Dahal, Binaya, MD   0.4 mg at 06/28/23 9186     Discharge Medications: Please see discharge summary for a list of discharge medications.  Relevant Imaging Results:  Relevant Lab Results:   Additional Information DDW:[id number]  Coda Mathey A Wojciech Willetts, LCSW

## 2023-06-28 NOTE — Plan of Care (Signed)

## 2023-06-28 NOTE — Plan of Care (Signed)
  Problem: Education: Goal: Knowledge of General Education information will improve Description: Including pain rating scale, medication(s)/side effects and non-pharmacologic comfort measures Outcome: Progressing   Problem: Health Behavior/Discharge Planning: Goal: Ability to manage health-related needs will improve Outcome: Progressing   Problem: Clinical Measurements: Goal: Ability to maintain clinical measurements within normal limits will improve Outcome: Progressing Goal: Will remain free from infection Outcome: Progressing Goal: Diagnostic test results will improve Outcome: Progressing Goal: Respiratory complications will improve Outcome: Progressing Goal: Cardiovascular complication will be avoided Outcome: Progressing   Problem: Activity: Goal: Risk for activity intolerance will decrease Outcome: Progressing   Problem: Nutrition: Goal: Adequate nutrition will be maintained Outcome: Progressing   Problem: Coping: Goal: Level of anxiety will decrease Outcome: Progressing   Problem: Elimination: Goal: Will not experience complications related to urinary retention Outcome: Progressing   Problem: Pain Managment: Goal: General experience of comfort will improve and/or be controlled Outcome: Progressing   Problem: Safety: Goal: Ability to remain free from injury will improve Outcome: Progressing   Problem: Skin Integrity: Goal: Risk for impaired skin integrity will decrease Outcome: Progressing

## 2023-06-28 NOTE — Progress Notes (Signed)
 Name: Timothy [name] DOB: 03-29-1956  Please be advised that the above-named patient will require a short-term nursing home stay -- anticipated 30 days or less for rehabilitation and strengthening. The plan is for return home.

## 2023-06-28 NOTE — TOC Initial Note (Addendum)
 Transition of Care [facility]) - Initial/Assessment Note    Patient Details  Name: Timothy [name] MRN: [id number] Date of Birth: November 07, 1955  Transition of Care [facility]) CM/SW Contact:    Jameer Storie A Laela Deviney, LCSW Phone Number: 06/28/2023, 1:43 PM  Clinical Narrative:                  CSW met with pt at bedside, he stated that he was agreeable to referral for SNF. SNF workup completed, no questions asked regarding SNF process after CSW explanation. Bed offers pending.  Pt has Technical Sales Engineer and [employer], pt open to using [facility] for placement first.  PASSR pending, needs 30 note and [place] Must application completed.   TOC will continue to follow.   Expected Discharge Plan: Skilled Nursing Facility Barriers to Discharge: Continued Medical Work up, English As A Second Language Teacher, SNF Pending bed offer   Patient Goals and CMS Choice Patient states their goals for this hospitalization and ongoing recovery are:: get better          Expected Discharge Plan and Services In-house Referral: Clinical Social Work   Post Acute Care Choice: Skilled Nursing Facility Living arrangements for the past 2 months: Single Family Home                                      Prior Living Arrangements/Services Living arrangements for the past 2 months: Single Family Home Lives with:: Self          Need for Family Participation in Patient Care: No (Comment)        Activities of Daily Living      Permission Sought/Granted                  Emotional Assessment Appearance:: Appears stated age Attitude/Demeanor/Rapport: Guarded Affect (typically observed): Appropriate Orientation: : Oriented to Self, Oriented to Place, Oriented to Situation Alcohol  / Substance Use: Not Applicable Psych Involvement: No (comment)  Admission diagnosis:  Cellulitis of left lower extremity [L03.116] Closed trimalleolar fracture of left ankle, initial encounter  [S82.852A] Trimalleolar fracture of ankle, closed, left, initial encounter [D17.147J] Urinary tract infection without hematuria, site unspecified [N39.0] Altered mental status, unspecified altered mental status type [R41.82] Patient Active Problem List   Diagnosis Date Noted   Trimalleolar fracture of ankle, closed, left, initial encounter 06/25/2023   Self-catheterizes urinary bladder 06/25/2023   Schizophrenia (HCC) 06/25/2023   Neurogenic bladder 06/25/2023   Essential (primary) hypertension 06/25/2023   Backache 06/25/2023   Fecal incontinence 06/25/2023   DNR (do not resuscitate)/DNI(Do not intubate) 06/25/2023   Paranoid schizophrenia (HCC) 02/01/2016   PCP:  [name], MD Pharmacy:   [facility] [phone] - [place], [place] - [address] AT [facility] [address] [facility] [place] [address] Phone: [phone] Fax: [phone]  [facility] [address] [place] [address] Phone: [phone] Fax: [phone]  [facility] - [address] [place]), [place] - [address] [place] [address] Phone: [phone] Fax: [phone]     Social Drivers of Health (SDOH) Social History: SDOH Screenings   Food Insecurity: Patient Declined (06/26/2023)  Housing: Patient Declined (06/26/2023)  Transportation Needs: Patient Declined (06/26/2023)  Utilities: Patient Declined (06/26/2023)  Social Connections: Unknown (06/26/2023)  Tobacco Use: Medium Risk (06/25/2023)   SDOH Interventions:  Readmission Risk Interventions     No data to display

## 2023-06-28 NOTE — Progress Notes (Signed)
 PROGRESS NOTE  OTT ZIMMERLE  DOB: Jan 04, 1956  PCP: [name], MD FMW:[id number]  DOA: 06/25/2023  LOS: 1 day  Hospital Day: 4  Brief narrative: Timothy [name] is a 68 y.o. male with PMH significant for prostate cancer with vertebral mets, s/p back surgery, wheelchair-bound status, neurogenic bladder requiring self cath, chronic fecal incontinence, gonorrhea, schizophrenia, PTSD, behavioral disturbance who follows up at [facility] [place]. 2/6, patient presented to ED with complaint of bilateral leg swelling He states it has been present for months but became much worse yesterday redness and swelling noted left more than right. Patient was apparently seen in the ED on January 4 for a fall.  He underwent x-ray of left knee at that time which was unremarkable and was discharged.  It seems he did not have ankle x-ray done at that time.  Patient reports pain and swelling since that fall.  In the ED, patient was hemodynamically stable CBC/CMP mostly unremarkable Catheter urinalysis showed hazy yellow color urine with small hemoglobin, moderate leukocytes, positive nitrite, many bacteria UDS negative Chest x-ray negative for any acute infiltrates X-ray left ankle showed acute mildly displaced distal fibular shaft fracture, acute mildly displaced medial malleolar fracture, mild posterior subluxation of the talar dome with respect to the distal tibia. EDP discussed with orthopedics Dr. Sherida.  Subjective: Patient was seen and examined this morning. Lying on bed.  Not in distress.  No new symptoms. Labs with potassium low at 3.2 today. No fever  Assessment and plan: Left ankle trimalleolar fracture  Secondary to a fall 5 weeks ago. Imaging as above. Orthopedics consulted. Surgical intervention likely Pain regimen: Tylenol  as needed, oxycodone  as needed Bowel regimen: MiraLAX  as needed Orthopedics consulted  Hypokalemia Potassium level low at 3.2 this morning.  Replacement  ordered Recent Labs  Lab 06/25/23 1441 06/26/23 0006 06/28/23 0450  K 3.6 3.3* 3.2*   Abnormal urinalysis Neurogenic bladder H/o prostate cancer Abnormal urinalysis likely secondary to chronic self-catheterization  Currently no evidence of infection.  Not on antibiotics Flomax  to continue   H/o back surgery Wheelchair-bound status   Fecal incontinence Since back surgery    Schizophrenia Home med list still not complete.  Sister to bring the list today.  Home med list has not been obtained/updated by [employer].  I have alerted the pharmacist on-call.  Please double check admission reconciliation.    Mobility: Chronically bedbound??  Goals of care   Code Status: Limited: Do not attempt resuscitation (DNR) -DNR-LIMITED -Do Not Intubate/DNI      DVT prophylaxis:  heparin  injection 5,000 Units Start: 06/26/23 1400 SCDs Start: 06/25/23 2303   Antimicrobials: None Fluid: None Consultants: Orthopedics Family Communication: None at beside  Status: Inpatient Level of care:  Med-Surg   Patient is from: Home Needs to continue in-hospital care: Needs procedure Anticipated d/c to: Pending clinical course   Diet:  Diet Order             Diet regular Room service appropriate? Yes; Fluid consistency: Thin  Diet effective now                   Scheduled Meds:  heparin   5,000 Units Subcutaneous Q8H   oxyCODONE   10 mg Oral Once   tamsulosin   0.4 mg Oral Daily    PRN meds: acetaminophen  **OR** acetaminophen , alum & mag hydroxide-simeth, labetalol , melatonin, ondansetron  **OR** ondansetron  (ZOFRAN ) IV, oxyCODONE    Infusions:    Antimicrobials: Anti-infectives (From admission, onward)    Start  Dose/Rate Route Frequency Ordered Stop   06/25/23 1630  cefTRIAXone  (ROCEPHIN ) 1 g in sodium chloride  0.9 % 100 mL IVPB        1 g 200 mL/hr over 30 Minutes Intravenous  Once 06/25/23 1621 06/25/23 1827       Objective: Vitals:   06/28/23 0455 06/28/23 0758   BP: 127/72 111/65  Pulse: 87 80  Resp: 18   Temp: 98.4 F (36.9 C) 99.2 F (37.3 C)  SpO2: 97% 93%    Intake/Output Summary (Last 24 hours) at 06/28/2023 0928 Last data filed at 06/28/2023 0455 Gross per 24 hour  Intake --  Output 992 ml  Net -992 ml   Filed Weights   06/25/23 1430  Weight: 124 kg   Weight change:  Body mass index is 35.1 kg/m.   Physical Exam: General exam: Pleasant, elderly African-American male Skin: No rashes, lesions or ulcers. HEENT: Atraumatic, normocephalic, no obvious bleeding Lungs: Clear to auscultation bilaterally,  CVS: S1, S2, no murmur,   GI/Abd: Soft, nontender, nondistended, bowel sound present,   CNS: Alert, awake, slow to respond, answers with few words only Psychiatry: Mood appropriate Extremities: Bilateral chronic lower extremity lymphedema, no calf tenderness, left lower extremity on bandage  Data Review: I have personally reviewed the laboratory data and studies available.  F/u labs  Unresulted Labs (From admission, onward)    None       Total time spent in review of labs and imaging, patient evaluation, formulation of plan, documentation and communication with family: 45 minutes  Signed, Chapman Rota, MD [facility] 06/28/2023

## 2023-06-29 DIAGNOSIS — S82852A Displaced trimalleolar fracture of left lower leg, initial encounter for closed fracture: Secondary | ICD-10-CM | POA: Diagnosis not present

## 2023-06-29 MED ORDER — DIPHENHYDRAMINE HCL 25 MG PO CAPS
25.0000 mg | ORAL_CAPSULE | Freq: Once | ORAL | Status: AC
  Administered 2023-06-29: 25 mg via ORAL
  Filled 2023-06-29: qty 1

## 2023-06-29 NOTE — Progress Notes (Signed)
 Physical Therapy Treatment Patient Details Name: Timothy [name] MRN: [id number] DOB: 1955/08/16 Today's Date: 06/29/2023   History of Present Illness 68 y/o male presents to [facility] ED on 2/6 for bilateral leg swelling/pain, redness and swelling noted to L>R. Imaging findings result in trimalleolar fx of the L ankle, mildly displaced distal fibular shaft fx, mild posterior subluxation of talar dome. PMH includes history of schizophrenia, chronic neurogenic bladder with a history of self catheterizations, chronic fecal incontinence, history of chronic back pain, prostate cx w/ mets to spine    PT Comments  Pt seen for PT tx with co-tx with OT for pt & therapists' safety. Pt agreeable to tx, hopeful to get in recliner. PT educated pt on NWB LLE but pt with decreased recall. Pt is able to come to sitting EOB with little assistance but unable to fully clear buttocks during STS attempts from EOB. Pt with poor awareness of technique, does not appear to push through RLE to assist with STS, instead, attempts to push through BUE only. Pt would benefit from ongoing PT services to address strengthening, balance, to increase independence with bed mobility & transfers. Recommend ongoing rehab in post acute setting <3 hours therapy/day to maximize independence with mobility & decrease caregiver burden.  Pt poor historian, unable to recall his correct age but able to recall his birthday (but notes he has 2 birthdays & gives two dates). Reports he has an aide then states he does not, he only has a friend. Pt reports he was ambulatory until the past few weeks but unable to state how he got to bathroom during that time.    If plan is discharge home, recommend the following: Two people to help with walking and/or transfers;Two people to help with bathing/dressing/bathroom;Direct supervision/assist for medications management;Help with stairs or ramp for entrance;Assist for transportation;Assistance with  cooking/housework;Direct supervision/assist for financial management;Supervision due to cognitive status   Can travel by private vehicle     No  Equipment Recommendations  Other (comment) (defer to next venue)    Recommendations for Other Services       Precautions / Restrictions Precautions Precautions: Fall Required Braces or Orthoses: Splint/Cast Splint/Cast: Stirrup splint, Post (short leg) splint Splint/Cast - Date Prophylactic Dressing Applied (if applicable): 06/25/23 Restrictions Weight Bearing Restrictions Per Provider Order: Yes LLE Weight Bearing Per Provider Order: Non weight bearing     Mobility  Bed Mobility Overal bed mobility: Needs Assistance Bed Mobility: Rolling, Supine to Sit, Sit to Supine Rolling: Total assist, Mod assist (roll R with total assist, roll L with mod assist with cuing to use bed rails, assistance for placing hands on rail)   Supine to sit: Contact guard, Used rails, HOB elevated (pt able to transfer to sitting EOB on R side of bed with extra time, use of bed rails, HOB elevated) Sit to supine: Mod assist, Max assist, +2 for physical assistance, Used rails, HOB elevated (assistance to elevate BLE into bed, pt with poor awareness of pt lying crooked in bed)   General bed mobility comments: Pt is able to scoot to [facility] with bed in trendelenburg position & use of BUE & bed rails.    Transfers                   General transfer comment: Attempted STS from elevated EOB with PT supporting LLE to help maintain NWB LLE, cuing re: hand placement, sequencing, & technique with pt verbalizing understanding but poor return demo. Pt quickly transitions UE from  pushing EOB to on RW, does not initiate pushing to standing with RLE. Attempted STS 2nd time with BUE on RW with cuing to push through RLE but pt with poor return demo, not able to fully clear buttocks before returning to sitting EOB.    Ambulation/Gait                   Stairs              Wheelchair Mobility     Tilt Bed    Modified Rankin (Stroke Patients Only)       Balance Overall balance assessment: Needs assistance Sitting-balance support: Feet supported, Bilateral upper extremity supported, Single extremity supported Sitting balance-Leahy Scale: Poor                                      Cognition Arousal: Alert Behavior During Therapy: Flat affect Overall Cognitive Status: No family/caregiver present to determine baseline cognitive functioning Area of Impairment: Orientation, Memory, Safety/judgement, Attention, Following commands, Awareness, Problem solving                 Orientation Level: Disoriented to, Place, Time, Situation, Person (able to state his name but unable to recall his age, does recall his correct birthday but also states he has 2 birthdays)   Memory: Decreased short-term memory, Decreased recall of precautions Following Commands: Follows one step commands inconsistently, Follows one step commands with increased time Safety/Judgement: Decreased awareness of safety, Decreased awareness of deficits Awareness: Intellectual Problem Solving: Slow processing, Decreased initiation, Difficulty sequencing, Requires verbal cues, Requires tactile cues General Comments: Pt with very poor memory, overall awareness. Repeatedly states he can get in the chair if he can do it his way despite PT & OT providing extensive education re: purpose of therapy, NWB LLE. Pt with decreased ability to follow simple commands throughout session.        Exercises Other Exercises Other Exercises: Pt able to somewhat scoot to R to [facility] with cuing for technique, extra time & cuing to continue.    General Comments        Pertinent Vitals/Pain Pain Assessment Pain Assessment: Faces Faces Pain Scale: No hurt    Home Living Family/patient expects to be discharged to:: Private residence Living Arrangements: Other relatives (brother  and nephew) Available Help at Discharge: Family;Available 24 hours/day Type of Home: House Home Access: Ramped entrance       Home Layout: One level Home Equipment: Agricultural consultant (2 wheels);Wheelchair - manual;BSC/3in1;Grab bars - tub/shower      Prior Function            PT Goals (current goals can now be found in the care plan section) Acute Rehab PT Goals Patient Stated Goal: to go home PT Goal Formulation: With patient Time For Goal Achievement: 07/11/23 Potential to Achieve Goals: Fair Progress towards PT goals: Progressing toward goals    Frequency    Min 1X/week      PT Plan      Co-evaluation PT/OT/SLP Co-Evaluation/Treatment: Yes Reason for Co-Treatment: For patient/therapist safety PT goals addressed during session: Mobility/safety with mobility;Proper use of DME;Balance        AM-PAC PT "6 Clicks" Mobility   Outcome Measure  Help needed turning from your back to your side while in a flat bed without using bedrails?: A Little Help needed moving from lying on your back to sitting on the side  of a flat bed without using bedrails?: A Lot Help needed moving to and from a bed to a chair (including a wheelchair)?: Total Help needed standing up from a chair using your arms (e.g., wheelchair or bedside chair)?: Total Help needed to walk in hospital room?: Total Help needed climbing 3-5 steps with a railing? : Total 6 Click Score: 9    End of Session Equipment Utilized During Treatment: Gait belt Activity Tolerance: Patient tolerated treatment well Patient left: in bed;with call bell/phone within reach;with bed alarm set (set up with meal tray, reviewed use of call bell)   PT Visit Diagnosis: Other abnormalities of gait and mobility (R26.89);History of falling (Z91.81);Muscle weakness (generalized) (M62.81)     Time: 1610-9604 PT Time Calculation (min) (ACUTE ONLY): 31 min  Charges:    $Therapeutic Activity: 8-22 mins PT General Charges $$ ACUTE  PT VISIT: 1 Visit                     Emaline Handsome, PT, DPT 06/29/23, 11:03 AM   Venetta Gill 06/29/2023, 11:00 AM

## 2023-06-29 NOTE — [place] FL2 (Signed)
[place] MEDICAID [id number] LEVEL OF CARE FORM     IDENTIFICATION  Patient Name: Timothy [name] Birthdate: 1955-11-12 Sex: male Admission Date (Current Location): 06/25/2023  [facility] and [place] Number:  Producer, television/film/video and Address:  [facility], [address], [place], [place] [address]      Provider Number: [id number]  Attending Physician Name and Address:  Lorin Glass, MD  Relative Name and Phone Number:  [name]  [phone] (    Current Level of Care: Hospital Recommended Level of Care: Skilled Nursing Facility Prior Approval Number:    Date Approved/Denied:   PASRR Number: screen still running  Discharge Plan: SNF    Current Diagnoses: Patient Active Problem List   Diagnosis Date Noted   Trimalleolar fracture of ankle, closed, left, initial encounter 06/25/2023   Self-catheterizes urinary bladder 06/25/2023   Schizophrenia (HCC) 06/25/2023   Neurogenic bladder 06/25/2023   Essential (primary) hypertension 06/25/2023   Backache 06/25/2023   Fecal incontinence 06/25/2023   DNR (do not resuscitate)/DNI(Do not intubate) 06/25/2023   Paranoid schizophrenia (HCC) 02/01/2016    Orientation RESPIRATION BLADDER Height & Weight     Self, Time, Situation, Place  Normal Incontinent, Indwelling catheter Weight: 273 lb 5.9 oz (124 kg) Height:     BEHAVIORAL SYMPTOMS/MOOD NEUROLOGICAL BOWEL NUTRITION STATUS      Continent Diet (see DC summary)  AMBULATORY STATUS COMMUNICATION OF NEEDS Skin   Extensive Assist Verbally Normal                       Personal Care Assistance Level of Assistance  Bathing, Dressing, Feeding Bathing Assistance: Maximum assistance Feeding assistance: Limited assistance Dressing Assistance: Maximum assistance     Functional Limitations Info  Sight, Hearing, Speech Sight Info: Adequate Hearing Info: Adequate Speech Info: Adequate    SPECIAL CARE FACTORS FREQUENCY  PT (By licensed PT), OT  (By licensed OT)     PT Frequency: 5x/week OT Frequency: 5x/week            Contractures Contractures Info: Not present    Additional Factors Info  Code Status, Allergies Code Status Info: DNR Allergies Info: Penicillins  Tamsulosin           Current Medications (06/29/2023):  This is the current hospital active medication list Current Facility-Administered Medications  Medication Dose Route Frequency Provider Last Rate Last Admin   acetaminophen (TYLENOL) tablet 650 mg  650 mg Oral Q6H PRN Carollee Herter, DO   650 mg at 06/26/23 2148   Or   acetaminophen (TYLENOL) suppository 650 mg  650 mg Rectal Q6H PRN Carollee Herter, DO       alum & mag hydroxide-simeth (MAALOX/MYLANTA) 200-200-20 MG/5ML suspension 30 mL  30 mL Oral Q4H PRN Carollee Herter, DO       heparin injection 5,000 Units  5,000 Units Subcutaneous Q8H Carollee Herter, DO   5,000 Units at 06/29/23 1440   labetalol (NORMODYNE) injection 10 mg  10 mg Intravenous Q4H PRN Carollee Herter, DO       melatonin tablet 10 mg  10 mg Oral QHS PRN Carollee Herter, DO   10 mg at 06/28/23 2105   ondansetron (ZOFRAN) tablet 4 mg  4 mg Oral Q6H PRN Carollee Herter, DO       Or   ondansetron [facility]) injection 4 mg  4 mg Intravenous Q6H PRN Carollee Herter, DO       oxyCODONE (Oxy IR/ROXICODONE) immediate release tablet 10 mg  10 mg Oral Once Carollee Herter, DO       oxyCODONE (Oxy IR/ROXICODONE) immediate release tablet 10 mg  10 mg Oral Q4H PRN Carollee Herter, DO   10 mg at 06/29/23 0907   tamsulosin (FLOMAX) capsule 0.4 mg  0.4 mg Oral Daily Dahal, Melina Schools, MD   0.4 mg at 06/29/23 0907     Discharge Medications: Please see discharge summary for a list of discharge medications.  Relevant Imaging Results:  Relevant Lab Results:   Additional Information WUJ:[id number]  Lorri Frederick, LCSW

## 2023-06-29 NOTE — Evaluation (Signed)
 Occupational Therapy Evaluation Patient Details Name: Timothy [name] MRN: [id number] DOB: July 22, 1955 Today's Date: 06/29/2023   History of Present Illness 68 y/o male presents to [facility] ED on 2/6 for bilateral leg swelling/pain, redness and swelling noted to L>R. Imaging findings result in trimalleolar fx of the L ankle, mildly displaced distal fibular shaft fx, mild posterior subluxation of talar dome. PMH includes history of schizophrenia, chronic neurogenic bladder with a history of self catheterizations, chronic fecal incontinence, history of chronic back pain, prostate cx w/ mets to spine   Clinical Impression   PTA, pt reports he lived with his brother and nephew and was mod I until recently in which he has been having difficulty getting OOB and mobilizing with his RW. Pt questionable historian and conflicting information in chart whether pt was performing his own mobility/ADL PTA. Upon eval, pt needing max multimodal cues and significantly increased time for sequencing basic mobility. Total A +2 for LB ADL and unable to perform STS transfer this session with total A +2 with limited initiation to push weight up through RLE. Fair maintenance of NWB precaution through LLE. Patient will benefit from continued inpatient follow up therapy, <3 hours/day     If plan is discharge home, recommend the following: Two people to help with walking and/or transfers;Two people to help with bathing/dressing/bathroom;Assistance with cooking/housework;Direct supervision/assist for medications management;Direct supervision/assist for financial management;Assist for transportation;Help with stairs or ramp for entrance    Functional Status Assessment  Patient has had a recent decline in their functional status and demonstrates the ability to make significant improvements in function in a reasonable and predictable amount of time.  Equipment Recommendations  Other (comment) (defer)    Recommendations for Other  Services       Precautions / Restrictions Precautions Precautions: Fall Required Braces or Orthoses: Splint/Cast Splint/Cast: Stirrup splint, Post (short leg) splint Splint/Cast - Date Prophylactic Dressing Applied (if applicable): 06/25/23 Restrictions Weight Bearing Restrictions Per Provider Order: Yes LLE Weight Bearing Per Provider Order: Non weight bearing      Mobility Bed Mobility Overal bed mobility: Needs Assistance Bed Mobility: Rolling, Supine to Sit, Sit to Supine Rolling: Total assist, Mod assist (roll R with total assist, roll L with mod assist with cuing to use bed rails, assistance for placing hands on rail)   Supine to sit: Contact guard, Used rails, HOB elevated (pt able to transfer to sitting EOB on R side of bed with extra time, use of bed rails, HOB elevated) Sit to supine: Mod assist, Max assist, +2 for physical assistance, Used rails, HOB elevated (assistance to elevate BLE into bed, pt with poor awareness of pt lying crooked in bed)   General bed mobility comments: Pt is able to scoot to [facility] with bed in trendelenburg position & use of BUE & bed rails and cueing to do so    Transfers                   General transfer comment: Attempted STS from elevated EOB with PT supporting LLE to help maintain NWB LLE, cueing re: hand placement, sequencing, & technique with pt verbalizing understanding but poor return demo. Pt quickly transitions UE from pushing EOB to on RW, does not initiate pushing to standing with RLE. Attempted STS 2nd time with BUE on RW with cuing to push through RLE but pt with poor return demo, not able to fully clear buttocks before returning to sitting EOB.      Balance Overall balance assessment:  Needs assistance Sitting-balance support: Feet supported, Bilateral upper extremity supported, Single extremity supported Sitting balance-Leahy Scale: Poor Sitting balance - Comments: benefits from BUE support                                    ADL either performed or assessed with clinical judgement   ADL Overall ADL's : Needs assistance/impaired Eating/Feeding: Set up;Bed level   Grooming: Set up;Sitting;Contact guard assist   Upper Body Bathing: Sitting;Minimal assistance   Lower Body Bathing: Maximal assistance;Sitting/lateral leans   Upper Body Dressing : Sitting;Minimal assistance   Lower Body Dressing: Total assistance;Bed level;+2 for physical assistance;+2 for safety/equipment                 General ADL Comments: pt unable to rise to stand this session with no effort to push up through RLE into standing     Vision Patient Visual Report: No change from baseline Additional Comments: pt only reported no changes. Making good eye contact with therapist and able to locate items in room     Perception         Praxis         Pertinent Vitals/Pain Pain Assessment Pain Assessment: Faces Faces Pain Scale: No hurt     Extremity/Trunk Assessment Upper Extremity Assessment Upper Extremity Assessment: Generalized weakness   Lower Extremity Assessment Lower Extremity Assessment: Defer to PT evaluation       Communication Communication Communication: No apparent difficulties Cueing Techniques: Verbal cues;Tactile cues;Gestural cues   Cognition Arousal: Alert Behavior During Therapy: Flat affect Overall Cognitive Status: No family/caregiver present to determine baseline cognitive functioning Area of Impairment: Orientation, Memory, Safety/judgement, Attention, Following commands, Awareness, Problem solving                 Orientation Level: Disoriented to, Place, Time, Situation, Person (able to state his name but unable to recall his age, does recall his correct birthday but also states he has 2 birthdays) Current Attention Level: Focused Memory: Decreased short-term memory, Decreased recall of precautions Following Commands: Follows one step commands inconsistently, Follows  one step commands with increased time Safety/Judgement: Decreased awareness of safety, Decreased awareness of deficits Awareness: Intellectual Problem Solving: Slow processing, Decreased initiation, Difficulty sequencing, Requires verbal cues, Requires tactile cues General Comments: Pt with very poor memory, overall awareness. Repeatedly states he can get in the chair if he can do it his way despite PT & OT providing extensive education re: purpose of therapy, NWB LLE. Pt with decreased ability to follow simple commands throughout session.     General Comments       Exercises Other Exercises Other Exercises: Pt able to somewhat scoot to R to [facility] with cuing for technique, extra time & cuing to continue.scooted ~3in   Shoulder Instructions      Home Living Family/patient expects to be discharged to:: Private residence Living Arrangements: Other relatives (brother and nephew) Available Help at Discharge: Family;Available 24 hours/day Type of Home: House Home Access: Ramped entrance     Home Layout: One level     Bathroom Shower/Tub: Walk-in shower         Home Equipment: Agricultural consultant (2 wheels);Wheelchair - manual;BSC/3in1;Grab bars - tub/shower          Prior Functioning/Environment Prior Level of Function : Independent/Modified Independent;History of Falls (last six months);Patient poor historian/Family not available  Mobility Comments: Pt reports he has not been walking for the pat few weeks unless he has his walker and even then has been unable to ambulate much recently ADLs Comments: pt reports  being independent with ADLs; brother is in a wheelchair, nephew helps with cleaning the house, nephew picks up food. has friend who may be able to help more per pt; unclear if friend has been helping recently        OT Problem List: Decreased strength;Decreased activity tolerance;Impaired balance (sitting and/or standing);Decreased cognition;Decreased safety  awareness;Decreased knowledge of use of DME or AE      OT Treatment/Interventions: Self-care/ADL training;Therapeutic exercise;DME and/or AE instruction;Balance training;Patient/family education;Therapeutic activities    OT Goals(Current goals can be found in the care plan section) Acute Rehab OT Goals Patient Stated Goal: go home OT Goal Formulation: With patient Time For Goal Achievement: 07/13/23 Potential to Achieve Goals: Fair  OT Frequency: Min 1X/week    Co-evaluation PT/OT/SLP Co-Evaluation/Treatment: Yes Reason for Co-Treatment: For patient/therapist safety PT goals addressed during session: Mobility/safety with mobility;Proper use of DME;Balance OT goals addressed during session: ADL's and self-care      AM-PAC OT "6 Clicks" Daily Activity     Outcome Measure Help from another person eating meals?: A Little Help from another person taking care of personal grooming?: A Little Help from another person toileting, which includes using toliet, bedpan, or urinal?: Total Help from another person bathing (including washing, rinsing, drying)?: A Lot Help from another person to put on and taking off regular upper body clothing?: A Little Help from another person to put on and taking off regular lower body clothing?: Total 6 Click Score: 13   End of Session Equipment Utilized During Treatment: Gait belt;Rolling walker (2 wheels) Nurse Communication: Mobility status  Activity Tolerance: Patient tolerated treatment well Patient left: in bed;with call bell/phone within reach;with bed alarm set  OT Visit Diagnosis: Unsteadiness on feet (R26.81);Muscle weakness (generalized) (M62.81);Other symptoms and signs involving cognitive function;History of falling (Z91.81)                Time: 0454-0981 OT Time Calculation (min): 32 min Charges:  OT General Charges $OT Visit: 1 Visit OT Evaluation $OT Eval Moderate Complexity: 1 Mod  Karilyn Ouch, OTR/L [facility] Acute  Rehabilitation Office: [phone]   Emery Hans 06/29/2023, 2:02 PM

## 2023-06-29 NOTE — Progress Notes (Signed)
 PROGRESS NOTE  Timothy [name]  DOB: January 29, 1956  PCP: Timothy Ache, MD ZOX:[id number]  DOA: 06/25/2023  LOS: 2 days  Hospital Day: 5  Brief narrative: Timothy [name] is a 68 y.o. male with PMH significant for prostate cancer with vertebral mets, s/p back surgery, wheelchair-bound status, neurogenic bladder requiring self cath, chronic fecal incontinence, gonorrhea, schizophrenia, PTSD, behavioral disturbance who follows up at [facility] [place]. 2/6, patient presented to ED with complaint of bilateral leg swelling He states it has been present for months but became much worse yesterday redness and swelling noted left more than right. Patient was apparently seen in the ED on January 4 for a fall.  He underwent x-ray of left knee at that time which was unremarkable and was discharged.  It seems he did not have ankle x-ray done at that time.  Patient reports pain and swelling since that fall.  In the ED, patient was hemodynamically stable CBC/CMP mostly unremarkable Catheter urinalysis showed hazy yellow color urine with small hemoglobin, moderate leukocytes, positive nitrite, many bacteria UDS negative Chest x-ray negative for any acute infiltrates X-ray left ankle showed acute mildly displaced distal fibular shaft fracture, acute mildly displaced medial malleolar fracture, mild posterior subluxation of the talar dome with respect to the distal tibia. EDP discussed with orthopedics Dr. Adrain [name].  Subjective: Patient was seen and examined this morning. Lying on bed.  Not in distress.  No new symptoms.  No family at bedside. Pending plan from orthopedics.  Assessment and plan: Left ankle trimalleolar fracture  Secondary to a fall 5 weeks ago. Imaging as above. Orthopedics consulted. Surgical intervention likely.  Pending definitive plan. Pain regimen: Tylenol  as needed, oxycodone  as needed Bowel regimen: MiraLAX  as needed Orthopedics consulted  Hypokalemia Potassium level was low and  replacement was given.  Repeat labs tomorrow Recent Labs  Lab 06/25/23 1441 06/26/23 0006 06/28/23 0450  K 3.6 3.3* 3.2*   Abnormal urinalysis Neurogenic bladder H/o prostate cancer Abnormal urinalysis likely secondary to chronic self-catheterization  Currently no evidence of infection.  Not on antibiotics Flomax  to continue   H/o back surgery Wheelchair-bound status   Fecal incontinence Since back surgery    Schizophrenia Home med list still not complete.  Not sure if he was actually taking Cogentin , Haldol , sertraline . Currently all of them are on hold  Home med list has not been obtained/updated by [facility].  I have alerted the pharmacist on-call.  Please double check admission reconciliation.    Mobility: Chronically bedbound??  Goals of care   Code Status: Limited: Do not attempt resuscitation (DNR) -DNR-LIMITED -Do Not Intubate/DNI      DVT prophylaxis:  heparin  injection 5,000 Units Start: 06/26/23 1400 SCDs Start: 06/25/23 2303   Antimicrobials: None Fluid: None Consultants: Orthopedics Family Communication: None at beside  Status: Inpatient Level of care:  Med-Surg   Patient is from: Home Needs to continue in-hospital care: Needs procedure Anticipated d/c to: Pending clinical course   Diet:  Diet Order             Diet regular Room service appropriate? Yes; Fluid consistency: Thin  Diet effective now                   Scheduled Meds:  heparin   5,000 Units Subcutaneous Q8H   oxyCODONE   10 mg Oral Once   tamsulosin   0.4 mg Oral Daily    PRN meds: acetaminophen  **OR** acetaminophen , alum & mag hydroxide-simeth, labetalol , melatonin, ondansetron  **OR** ondansetron  (ZOFRAN ) IV, oxyCODONE   Infusions:    Antimicrobials: Anti-infectives (From admission, onward)    Start     Dose/Rate Route Frequency Ordered Stop   06/25/23 1630  cefTRIAXone  (ROCEPHIN ) 1 g in sodium chloride  0.9 % 100 mL IVPB        1 g 200 mL/hr over 30 Minutes  Intravenous  Once 06/25/23 1621 06/25/23 1827       Objective: Vitals:   06/29/23 0517 06/29/23 0731  BP: 117/72 117/72  Pulse: 75 77  Resp: 19 18  Temp: 97.6 F (36.4 C) 99.2 F (37.3 C)  SpO2: 94% 94%    Intake/Output Summary (Last 24 hours) at 06/29/2023 1104 Last data filed at 06/29/2023 0631 Gross per 24 hour  Intake --  Output 700 ml  Net -700 ml   Filed Weights   06/25/23 1430  Weight: 124 kg   Weight change:  Body mass index is 35.1 kg/m.   Physical Exam: General exam: Pleasant, elderly African-American male Skin: No rashes, lesions or ulcers. HEENT: Atraumatic, normocephalic, no obvious bleeding Lungs: Clear to auscultation bilaterally,  CVS: S1, S2, no murmur,   GI/Abd: Soft, nontender, nondistended, bowel sound present,   CNS: Alert, awake, slow to respond, answers with few words only Psychiatry: Mood appropriate Extremities: Bilateral chronic lower extremity lymphedema, no calf tenderness, left lower extremity on bandage  Data Review: I have personally reviewed the laboratory data and studies available.  F/u labs  Unresulted Labs (From admission, onward)    None       Total time spent in review of labs and imaging, patient evaluation, formulation of plan, documentation and communication with family: 25 minutes  Signed, Timothy Macleod, MD [facility] 06/29/2023

## 2023-06-29 NOTE — Plan of Care (Signed)
   Problem: Activity: Goal: Risk for activity intolerance will decrease Outcome: Progressing   Problem: Nutrition: Goal: Adequate nutrition will be maintained Outcome: Progressing   Problem: Coping: Goal: Level of anxiety will decrease Outcome: Progressing

## 2023-06-30 DIAGNOSIS — S82852A Displaced trimalleolar fracture of left lower leg, initial encounter for closed fracture: Secondary | ICD-10-CM | POA: Diagnosis not present

## 2023-06-30 LAB — CULTURE, BLOOD (ROUTINE X 2)
Culture: NO GROWTH
Culture: NO GROWTH
Special Requests: ADEQUATE

## 2023-06-30 NOTE — Progress Notes (Addendum)
PROGRESS NOTE  SHANDELL JALLOW  DOB: 09-06-1955  PCP: Wendi Maya, MD QMV:[id number]  DOA: 06/25/2023  LOS: 3 days  Hospital Day: 6  Brief narrative: Timothy [name] is a 68 y.o. male with PMH significant for prostate cancer with vertebral mets, s/p back surgery, wheelchair-bound status, neurogenic bladder requiring self cath, chronic fecal incontinence, gonorrhea, schizophrenia, PTSD, behavioral disturbance who follows up at [facility] [place]. 2/6, patient presented to ED with complaint of bilateral leg swelling He states it has been present for months but became much worse yesterday redness and swelling noted left more than right. Patient was apparently seen in the ED on January 4 for a fall.  He underwent x-ray of left knee at that time which was unremarkable and was discharged.  It seems he did not have ankle x-ray done at that time.  Patient reports pain and swelling since that fall.  In the ED, patient was hemodynamically stable CBC/CMP mostly unremarkable Catheter urinalysis showed hazy yellow color urine with small hemoglobin, moderate leukocytes, positive nitrite, many bacteria UDS negative Chest x-ray negative for any acute infiltrates X-ray left ankle showed acute mildly displaced distal fibular shaft fracture, acute mildly displaced medial malleolar fracture, mild posterior subluxation of the talar dome with respect to the distal tibia. EDP discussed with orthopedics Dr. Hulda Humphrey. Admitted to [facility] Orthopedic consulted.  Pending definite plan orthopedics  Subjective: Patient was seen and examined this morning. Lying on bed.  Not in distress.  No new symptoms.  No family at bedside.  Assessment and plan: Left ankle trimalleolar fracture  Secondary to a fall 5 weeks ago. Imaging as above. Orthopedics consulted. Surgical intervention likely.  Per orthopedics PA, on the OR schedule for tomorrow 2/12. Pain regimen: Tylenol as needed, oxycodone as needed Bowel regimen: MiraLAX as  needed Orthopedics consulted  Hypokalemia Potassium level was low and replacement was given.  Repeat labs tomorrow Recent Labs  Lab 06/25/23 1441 06/26/23 0006 06/28/23 0450  K 3.6 3.3* 3.2*   Abnormal urinalysis Neurogenic bladder H/o prostate cancer Abnormal urinalysis likely secondary to chronic self-catheterization  Currently no evidence of infection.  Not on antibiotics Flomax to continue   H/o back surgery Wheelchair-bound status   Fecal incontinence Since back surgery    Schizophrenia Home med list still not complete.  Not sure if he was actually taking Cogentin, Haldol, sertraline. Currently all of them are on hold  Home med list has not been obtained/updated by [facility].  I have alerted the pharmacist on-call.  Please double check admission reconciliation.    Mobility: Chronically bedbound??  Goals of care   Code Status: Limited: Do not attempt resuscitation (DNR) -DNR-LIMITED -Do Not Intubate/DNI      DVT prophylaxis:  heparin injection 5,000 Units Start: 06/26/23 1400 SCDs Start: 06/25/23 2303   Antimicrobials: None Fluid: None Consultants: Orthopedics Family Communication: None at beside  Status: Inpatient Level of care:  Med-Surg   Patient is from: Home Needs to continue in-hospital care: Needs procedure Anticipated d/c to: Pending clinical course   Diet:  Diet Order             Diet NPO time specified Except for: Sips with Meds  Diet effective midnight           Diet regular Room service appropriate? Yes; Fluid consistency: Thin  Diet effective now                   Scheduled Meds:  heparin  5,000 Units Subcutaneous Q8H  oxyCODONE  10 mg Oral Once   tamsulosin  0.4 mg Oral Daily    PRN meds: acetaminophen **OR** acetaminophen, alum & mag hydroxide-simeth, labetalol, melatonin, ondansetron **OR** ondansetron (ZOFRAN) IV, oxyCODONE   Infusions:    Antimicrobials: Anti-infectives (From admission, onward)    Start      Dose/Rate Route Frequency Ordered Stop   06/25/23 1630  cefTRIAXone (ROCEPHIN) 1 g in sodium chloride 0.9 % 100 mL IVPB        1 g 200 mL/hr over 30 Minutes Intravenous  Once 06/25/23 1621 06/25/23 1827       Objective: Vitals:   06/30/23 0806 06/30/23 1109  BP: 130/71 119/74  Pulse: 68 79  Resp: 16 17  Temp: 98.1 F (36.7 C) 98.2 F (36.8 C)  SpO2: 95% 94%    Intake/Output Summary (Last 24 hours) at 06/30/2023 1118 Last data filed at 06/30/2023 0809 Gross per 24 hour  Intake 240 ml  Output 1050 ml  Net -810 ml   Filed Weights   06/25/23 1430  Weight: 124 kg   Weight change:  Body mass index is 35.1 kg/m.   Physical Exam: General exam: Pleasant, elderly African-American male Skin: No rashes, lesions or ulcers. HEENT: Atraumatic, normocephalic, no obvious bleeding Lungs: Clear to auscultation bilaterally,  CVS: S1, S2, no murmur,   GI/Abd: Soft, nontender, nondistended, bowel sound present,   CNS: Alert, awake, slow to respond, answers with few words only Psychiatry: Mood appropriate Extremities: Bilateral chronic lower extremity lymphedema, no calf tenderness, left lower extremity on bandage  Data Review: I have personally reviewed the laboratory data and studies available.  F/u labs  Unresulted Labs (From admission, onward)     Start     Ordered   07/01/23 0500  Basic metabolic panel  Tomorrow morning,   R       Question:  Specimen collection method  Answer:  Lab=Lab collect   06/30/23 1006   07/01/23 0500  CBC with Differential/Platelet  Tomorrow morning,   R       Question:  Specimen collection method  Answer:  Lab=Lab collect   06/30/23 1006            Total time spent in review of labs and imaging, patient evaluation, formulation of plan, documentation and communication with family: 25 minutes  Signed, Lorin Glass, MD [facility] 06/30/2023

## 2023-07-01 ENCOUNTER — Encounter (HOSPITAL_COMMUNITY)
Admission: EM | Disposition: A | Discharge status: Skilled Nursing Facility | Payer: Self-pay | Source: Home / Self Care | Attending: Internal Medicine

## 2023-07-01 ENCOUNTER — Inpatient Hospital Stay (HOSPITAL_COMMUNITY): Payer: No Typology Code available for payment source

## 2023-07-01 ENCOUNTER — Other Ambulatory Visit: Payer: Self-pay

## 2023-07-01 ENCOUNTER — Encounter (HOSPITAL_COMMUNITY): Payer: Self-pay | Admitting: Internal Medicine

## 2023-07-01 DIAGNOSIS — S82852A Displaced trimalleolar fracture of left lower leg, initial encounter for closed fracture: Secondary | ICD-10-CM

## 2023-07-01 HISTORY — PX: ORIF ANKLE FRACTURE: SHX5408

## 2023-07-01 LAB — CBC WITH DIFFERENTIAL/PLATELET
Abs Immature Granulocytes: 0.03 10*3/uL (ref 0.00–0.07)
Basophils Absolute: 0 10*3/uL (ref 0.0–0.1)
Basophils Relative: 0 %
Eosinophils Absolute: 0.2 10*3/uL (ref 0.0–0.5)
Eosinophils Relative: 4 %
HCT: 35.3 % — ABNORMAL LOW (ref 39.0–52.0)
Hemoglobin: 12.2 g/dL — ABNORMAL LOW (ref 13.0–17.0)
Immature Granulocytes: 1 %
Lymphocytes Relative: 16 %
Lymphs Abs: 0.7 10*3/uL (ref 0.7–4.0)
MCH: 29.8 pg (ref 26.0–34.0)
MCHC: 34.6 g/dL (ref 30.0–36.0)
MCV: 86.3 fL (ref 80.0–100.0)
Monocytes Absolute: 0.6 10*3/uL (ref 0.1–1.0)
Monocytes Relative: 13 %
Neutro Abs: 3.2 10*3/uL (ref 1.7–7.7)
Neutrophils Relative %: 66 %
Platelets: 387 10*3/uL (ref 150–400)
RBC: 4.09 MIL/uL — ABNORMAL LOW (ref 4.22–5.81)
RDW: 12.1 % (ref 11.5–15.5)
WBC: 4.8 10*3/uL (ref 4.0–10.5)
nRBC: 0 % (ref 0.0–0.2)

## 2023-07-01 LAB — CBC
HCT: 37.3 % — ABNORMAL LOW (ref 39.0–52.0)
Hemoglobin: 13 g/dL (ref 13.0–17.0)
MCH: 30.2 pg (ref 26.0–34.0)
MCHC: 34.9 g/dL (ref 30.0–36.0)
MCV: 86.7 fL (ref 80.0–100.0)
Platelets: 436 10*3/uL — ABNORMAL HIGH (ref 150–400)
RBC: 4.3 MIL/uL (ref 4.22–5.81)
RDW: 12.1 % (ref 11.5–15.5)
WBC: 7 10*3/uL (ref 4.0–10.5)
nRBC: 0 % (ref 0.0–0.2)

## 2023-07-01 LAB — CREATININE, SERUM
Creatinine, Ser: 1.05 mg/dL (ref 0.61–1.24)
GFR, Estimated: >60 mL/min (ref >60)

## 2023-07-01 LAB — BASIC METABOLIC PANEL
Anion gap: 9 (ref 5–15)
BUN: 8 mg/dL (ref 8–23)
CO2: 24 mmol/L (ref 22–32)
Calcium: 8.4 mg/dL — ABNORMAL LOW (ref 8.9–10.3)
Chloride: 102 mmol/L (ref 98–111)
Creatinine, Ser: 1 mg/dL (ref 0.61–1.24)
GFR, Estimated: >60 mL/min (ref >60)
Glucose, Bld: 106 mg/dL — ABNORMAL HIGH (ref 70–99)
Potassium: 3.3 mmol/L — ABNORMAL LOW (ref 3.5–5.1)
Sodium: 135 mmol/L (ref 135–145)

## 2023-07-01 LAB — VITAMIN D 25 HYDROXY (VIT D DEFICIENCY, FRACTURES): Vit D, 25-Hydroxy: 28.44 ng/mL — ABNORMAL LOW (ref 30–100)

## 2023-07-01 LAB — SURGICAL PCR SCREEN
MRSA, PCR: NEGATIVE
Staphylococcus aureus: NEGATIVE

## 2023-07-01 SURGERY — OPEN REDUCTION INTERNAL FIXATION (ORIF) ANKLE FRACTURE
Anesthesia: Regional | Site: Ankle | Laterality: Left

## 2023-07-01 MED ORDER — TRANEXAMIC ACID-NACL 1000-0.7 MG/100ML-% IV SOLN
INTRAVENOUS | Status: AC
  Filled 2023-07-01: qty 100

## 2023-07-01 MED ORDER — FENTANYL CITRATE (PF) 100 MCG/2ML IJ SOLN
INTRAMUSCULAR | Status: AC
Start: 2023-07-01 — End: 2023-07-01
  Administered 2023-07-01: 100 ug
  Filled 2023-07-01: qty 2

## 2023-07-01 MED ORDER — ONDANSETRON HCL 4 MG/2ML IJ SOLN: 4.0000 mg | Freq: Four times a day (QID) | INTRAMUSCULAR | Status: DC | PRN

## 2023-07-01 MED ORDER — DEXAMETHASONE SODIUM PHOSPHATE 10 MG/ML IJ SOLN
INTRAMUSCULAR | Status: DC | PRN
  Administered 2023-07-01 (×2): 5 mg

## 2023-07-01 MED ORDER — LIDOCAINE 2% (20 MG/ML) 5 ML SYRINGE
INTRAMUSCULAR | Status: DC | PRN
  Administered 2023-07-01: 40 mg via INTRAVENOUS

## 2023-07-01 MED ORDER — VANCOMYCIN HCL IN DEXTROSE 1-5 GM/200ML-% IV SOLN
1000.0000 mg | Freq: Once | INTRAVENOUS | Status: AC
  Administered 2023-07-02: 1000 mg via INTRAVENOUS
  Filled 2023-07-01: qty 200

## 2023-07-01 MED ORDER — ROCURONIUM BROMIDE 10 MG/ML (PF) SYRINGE
PREFILLED_SYRINGE | INTRAVENOUS | Status: DC | PRN
  Administered 2023-07-01: 50 mg via INTRAVENOUS

## 2023-07-01 MED ORDER — MIDAZOLAM HCL 2 MG/2ML IJ SOLN
INTRAMUSCULAR | Status: AC
  Filled 2023-07-01: qty 2

## 2023-07-01 MED ORDER — SUGAMMADEX SODIUM 200 MG/2ML IV SOLN
INTRAVENOUS | Status: DC | PRN
  Administered 2023-07-01: 200 mg via INTRAVENOUS

## 2023-07-01 MED ORDER — DOCUSATE SODIUM 100 MG PO CAPS
100.0000 mg | ORAL_CAPSULE | Freq: Two times a day (BID) | ORAL | Status: DC
  Administered 2023-07-01 – 2023-07-22 (×38): 100 mg via ORAL
  Filled 2023-07-01 (×39): qty 1

## 2023-07-01 MED ORDER — POLYETHYLENE GLYCOL 3350 17 G PO PACK
17.0000 g | PACK | Freq: Every day | ORAL | Status: DC | PRN
  Administered 2023-07-01 – 2023-07-05 (×4): 17 g via ORAL
  Filled 2023-07-01 (×5): qty 1

## 2023-07-01 MED ORDER — DEXAMETHASONE SODIUM PHOSPHATE 10 MG/ML IJ SOLN
INTRAMUSCULAR | Status: AC
  Filled 2023-07-01: qty 1

## 2023-07-01 MED ORDER — CHLORHEXIDINE GLUCONATE 4 % EX SOLN: 60.0000 mL | Freq: Once | CUTANEOUS | Status: DC

## 2023-07-01 MED ORDER — CEFAZOLIN SODIUM-DEXTROSE 3-4 GM/150ML-% IV SOLN
INTRAVENOUS | Status: AC
  Filled 2023-07-01: qty 150

## 2023-07-01 MED ORDER — CLINDAMYCIN PHOSPHATE 900 MG/50ML IV SOLN
INTRAVENOUS | Status: DC | PRN
  Administered 2023-07-01: 900 mg via INTRAVENOUS

## 2023-07-01 MED ORDER — LIDOCAINE 2% (20 MG/ML) 5 ML SYRINGE
INTRAMUSCULAR | Status: AC
  Filled 2023-07-01: qty 5

## 2023-07-01 MED ORDER — CYCLOBENZAPRINE HCL 10 MG PO TABS
10.0000 mg | ORAL_TABLET | Freq: Two times a day (BID) | ORAL | Status: DC | PRN
  Administered 2023-07-01 – 2023-07-21 (×12): 10 mg via ORAL
  Filled 2023-07-01 (×13): qty 1

## 2023-07-01 MED ORDER — PROPOFOL 10 MG/ML IV BOLUS
INTRAVENOUS | Status: DC | PRN
  Administered 2023-07-01: 200 mg via INTRAVENOUS

## 2023-07-01 MED ORDER — FENTANYL CITRATE (PF) 250 MCG/5ML IJ SOLN
INTRAMUSCULAR | Status: AC
  Filled 2023-07-01: qty 5

## 2023-07-01 MED ORDER — CHLORHEXIDINE GLUCONATE 0.12 % MT SOLN
OROMUCOSAL | Status: AC
  Administered 2023-07-01: 15 mL via OROMUCOSAL
  Filled 2023-07-01: qty 15

## 2023-07-01 MED ORDER — SODIUM CHLORIDE 0.9 % IV SOLN: INTRAVENOUS | Status: DC

## 2023-07-01 MED ORDER — POVIDONE-IODINE 10 % EX SWAB
2.0000 | Freq: Once | CUTANEOUS | Status: AC
  Administered 2023-07-01: 2 via TOPICAL

## 2023-07-01 MED ORDER — DEXAMETHASONE SODIUM PHOSPHATE 10 MG/ML IJ SOLN
INTRAMUSCULAR | Status: DC | PRN
  Administered 2023-07-01: 5 mg via INTRAVENOUS

## 2023-07-01 MED ORDER — PHENYLEPHRINE 80 MCG/ML (10ML) SYRINGE FOR IV PUSH (FOR BLOOD PRESSURE SUPPORT)
PREFILLED_SYRINGE | INTRAVENOUS | Status: DC | PRN
  Administered 2023-07-01: 80 ug via INTRAVENOUS
  Administered 2023-07-01: 160 ug via INTRAVENOUS

## 2023-07-01 MED ORDER — BACITRACIN ZINC 500 UNIT/GM EX OINT
TOPICAL_OINTMENT | CUTANEOUS | Status: AC
  Filled 2023-07-01: qty 28.35

## 2023-07-01 MED ORDER — PROPOFOL 10 MG/ML IV BOLUS
INTRAVENOUS | Status: AC
  Filled 2023-07-01: qty 20

## 2023-07-01 MED ORDER — TRANEXAMIC ACID-NACL 1000-0.7 MG/100ML-% IV SOLN: 1000.0000 mg | INTRAVENOUS | Status: DC

## 2023-07-01 MED ORDER — ACETAMINOPHEN 325 MG PO TABS
650.0000 mg | ORAL_TABLET | Freq: Four times a day (QID) | ORAL | Status: DC
  Administered 2023-07-01 – 2023-07-22 (×63): 650 mg via ORAL
  Filled 2023-07-01 (×68): qty 2

## 2023-07-01 MED ORDER — VITAMIN D 25 MCG (1000 UNIT) PO TABS
1000.0000 [IU] | ORAL_TABLET | Freq: Every day | ORAL | Status: DC
  Administered 2023-07-01 – 2023-07-22 (×20): 1000 [IU] via ORAL
  Filled 2023-07-01 (×21): qty 1

## 2023-07-01 MED ORDER — CEFAZOLIN SODIUM-DEXTROSE 3-4 GM/150ML-% IV SOLN
3.0000 g | INTRAVENOUS | Status: DC
  Filled 2023-07-01: qty 150

## 2023-07-01 MED ORDER — FENTANYL CITRATE (PF) 100 MCG/2ML IJ SOLN: 25.0000 ug | INTRAMUSCULAR | Status: DC | PRN

## 2023-07-01 MED ORDER — ONDANSETRON HCL 4 MG PO TABS: 4.0000 mg | ORAL_TABLET | Freq: Four times a day (QID) | ORAL | Status: DC | PRN

## 2023-07-01 MED ORDER — CEFAZOLIN SODIUM-DEXTROSE 2-4 GM/100ML-% IV SOLN
INTRAVENOUS | Status: DC
Start: 2023-07-01 — End: 2023-07-01
  Filled 2023-07-01: qty 100

## 2023-07-01 MED ORDER — POTASSIUM CHLORIDE 10 MEQ/100ML IV SOLN: 10.0000 meq | INTRAVENOUS | Status: AC

## 2023-07-01 MED ORDER — PHENYLEPHRINE 80 MCG/ML (10ML) SYRINGE FOR IV PUSH (FOR BLOOD PRESSURE SUPPORT)
PREFILLED_SYRINGE | INTRAVENOUS | Status: AC
  Filled 2023-07-01: qty 10

## 2023-07-01 MED ORDER — ROPIVACAINE HCL 5 MG/ML IJ SOLN
INTRAMUSCULAR | Status: DC | PRN
  Administered 2023-07-01: 20 mL via PERINEURAL
  Administered 2023-07-01: 30 mL via PERINEURAL

## 2023-07-01 MED ORDER — ALBUMIN HUMAN 5 % IV SOLN: INTRAVENOUS | Status: DC | PRN

## 2023-07-01 MED ORDER — FENTANYL CITRATE (PF) 250 MCG/5ML IJ SOLN
INTRAMUSCULAR | Status: DC | PRN
  Administered 2023-07-01: 100 ug via INTRAVENOUS

## 2023-07-01 MED ORDER — ENOXAPARIN SODIUM 40 MG/0.4ML IJ SOSY
40.0000 mg | PREFILLED_SYRINGE | INTRAMUSCULAR | Status: DC
  Administered 2023-07-02 – 2023-07-14 (×12): 40 mg via SUBCUTANEOUS
  Filled 2023-07-01 (×14): qty 0.4

## 2023-07-01 MED ORDER — POTASSIUM CHLORIDE 10 MEQ/100ML IV SOLN
10.0000 meq | INTRAVENOUS | Status: AC
  Administered 2023-07-01 (×3): 10 meq via INTRAVENOUS
  Filled 2023-07-01 (×3): qty 100

## 2023-07-01 MED ORDER — ONDANSETRON HCL 4 MG/2ML IJ SOLN
INTRAMUSCULAR | Status: AC
  Filled 2023-07-01: qty 2

## 2023-07-01 MED ORDER — METOCLOPRAMIDE HCL 5 MG/ML IJ SOLN: 5.0000 mg | Freq: Three times a day (TID) | INTRAMUSCULAR | Status: DC | PRN

## 2023-07-01 MED ORDER — VANCOMYCIN HCL IN DEXTROSE 1-5 GM/200ML-% IV SOLN: 1000.0000 mg | Freq: Two times a day (BID) | INTRAVENOUS | Status: DC

## 2023-07-01 MED ORDER — ONDANSETRON HCL 4 MG/2ML IJ SOLN
INTRAMUSCULAR | Status: DC | PRN
  Administered 2023-07-01: 4 mg via INTRAVENOUS

## 2023-07-01 MED ORDER — OXYCODONE HCL 5 MG PO TABS
10.0000 mg | ORAL_TABLET | ORAL | Status: DC | PRN
  Administered 2023-07-01 – 2023-07-03 (×5): 10 mg via ORAL
  Administered 2023-07-03: 15 mg via ORAL
  Administered 2023-07-03 – 2023-07-04 (×3): 10 mg via ORAL
  Administered 2023-07-04: 15 mg via ORAL
  Administered 2023-07-05: 10 mg via ORAL
  Administered 2023-07-05 – 2023-07-07 (×5): 15 mg via ORAL
  Administered 2023-07-08: 10 mg via ORAL
  Administered 2023-07-08: 15 mg via ORAL
  Administered 2023-07-08 – 2023-07-10 (×3): 10 mg via ORAL
  Filled 2023-07-01: qty 2
  Filled 2023-07-01: qty 3
  Filled 2023-07-01: qty 2
  Filled 2023-07-01: qty 3
  Filled 2023-07-01 (×6): qty 2
  Filled 2023-07-01 (×4): qty 3
  Filled 2023-07-01 (×2): qty 2
  Filled 2023-07-01 (×2): qty 3
  Filled 2023-07-01: qty 2
  Filled 2023-07-01: qty 3
  Filled 2023-07-01: qty 2

## 2023-07-01 MED ORDER — FENTANYL CITRATE PF 50 MCG/ML IJ SOSY: 50.0000 ug | PREFILLED_SYRINGE | Freq: Once | INTRAMUSCULAR | Status: DC

## 2023-07-01 MED ORDER — METOCLOPRAMIDE HCL 5 MG PO TABS: 5.0000 mg | ORAL_TABLET | Freq: Three times a day (TID) | ORAL | Status: DC | PRN

## 2023-07-01 MED ORDER — VANCOMYCIN HCL 1000 MG IV SOLR
INTRAVENOUS | Status: DC | PRN
  Administered 2023-07-01: 1000 mg

## 2023-07-01 MED ORDER — LACTATED RINGERS IV SOLN: INTRAVENOUS | Status: DC

## 2023-07-01 MED ORDER — 0.9 % SODIUM CHLORIDE (POUR BTL) OPTIME
TOPICAL | Status: DC | PRN
  Administered 2023-07-01: 1000 mL

## 2023-07-01 MED ORDER — CLINDAMYCIN PHOSPHATE 900 MG/50ML IV SOLN
INTRAVENOUS | Status: AC
  Filled 2023-07-01: qty 50

## 2023-07-01 MED ORDER — ACETAMINOPHEN 650 MG RE SUPP: 650.0000 mg | Freq: Four times a day (QID) | RECTAL | Status: DC

## 2023-07-01 MED ORDER — ROCURONIUM BROMIDE 10 MG/ML (PF) SYRINGE
PREFILLED_SYRINGE | INTRAVENOUS | Status: AC
  Filled 2023-07-01: qty 10

## 2023-07-01 MED ORDER — CHLORHEXIDINE GLUCONATE 0.12 % MT SOLN: 15.0000 mL | Freq: Once | OROMUCOSAL | Status: AC

## 2023-07-01 MED ORDER — ORAL CARE MOUTH RINSE: 15.0000 mL | Freq: Once | OROMUCOSAL | Status: AC

## 2023-07-01 MED ORDER — VANCOMYCIN HCL 1000 MG IV SOLR
INTRAVENOUS | Status: AC
  Filled 2023-07-01: qty 20

## 2023-07-01 SURGICAL SUPPLY — 67 items
BAG COUNTER SPONGE SURGICOUNT (BAG) ×1 IMPLANT
BANDAGE ESMARK 6X9 LF (GAUZE/BANDAGES/DRESSINGS) ×1 IMPLANT
BIT DRILL QC 2.0 SHORT EVOS SM (DRILL) IMPLANT
BIT DRILL QC 2.5MM SHRT EVO SM (DRILL) IMPLANT
BNDG COHESIVE 4X5 TAN STRL (GAUZE/BANDAGES/DRESSINGS) ×1 IMPLANT
BNDG ELASTIC 4X5.8 VLCR STR LF (GAUZE/BANDAGES/DRESSINGS) IMPLANT
BNDG ELASTIC 6X10 VLCR STRL LF (GAUZE/BANDAGES/DRESSINGS) IMPLANT
BNDG ELASTIC 6X5.8 VLCR STR LF (GAUZE/BANDAGES/DRESSINGS) IMPLANT
BNDG ESMARK 6X9 LF (GAUZE/BANDAGES/DRESSINGS) ×1 IMPLANT
BRUSH SCRUB EZ PLAIN DRY (MISCELLANEOUS) ×2 IMPLANT
CHLORAPREP W/TINT 26 (MISCELLANEOUS) ×1 IMPLANT
COVER SURGICAL LIGHT HANDLE (MISCELLANEOUS) ×1 IMPLANT
DRAPE C-ARM 42X72 X-RAY (DRAPES) ×1 IMPLANT
DRAPE C-ARMOR (DRAPES) ×1 IMPLANT
DRAPE SURG ORHT 6 SPLT 77X108 (DRAPES) ×2 IMPLANT
DRAPE U-SHAPE 47X51 STRL (DRAPES) ×1 IMPLANT
DRILL QC 2.0 SHORT EVOS SM (DRILL) ×1 IMPLANT
DRILL QC 2.5MM SHORT EVOS SM (DRILL) ×1 IMPLANT
DRSG ADAPTIC 3X8 NADH LF (GAUZE/BANDAGES/DRESSINGS) IMPLANT
DRSG MEPITEL 4X7.2 (GAUZE/BANDAGES/DRESSINGS) IMPLANT
ELECT REM PT RETURN 9FT ADLT (ELECTROSURGICAL) ×1 IMPLANT
ELECTRODE REM PT RTRN 9FT ADLT (ELECTROSURGICAL) ×1 IMPLANT
GAUZE SPONGE 4X4 12PLY STRL (GAUZE/BANDAGES/DRESSINGS) IMPLANT
GLOVE BIO SURGEON STRL SZ 6.5 (GLOVE) ×3 IMPLANT
GLOVE BIO SURGEON STRL SZ7.5 (GLOVE) ×3 IMPLANT
GLOVE BIOGEL PI IND STRL 6.5 (GLOVE) ×1 IMPLANT
GLOVE BIOGEL PI IND STRL 7.5 (GLOVE) ×1 IMPLANT
GOWN STRL REUS W/ TWL LRG LVL3 (GOWN DISPOSABLE) ×2 IMPLANT
K-WIRE FX150X1.6XTROC PNT (WIRE) ×1 IMPLANT
KIT TURNOVER KIT B (KITS) ×1 IMPLANT
KWIRE FX150X1.6XTROC PNT (WIRE) IMPLANT
MANIFOLD NEPTUNE II (INSTRUMENTS) ×1 IMPLANT
NDL HYPO 21X1.5 SAFETY (NEEDLE) IMPLANT
NDL HYPO 25GX1X1/2 BEV (NEEDLE) ×1 IMPLANT
NEEDLE HYPO 21X1.5 SAFETY (NEEDLE) IMPLANT
NEEDLE HYPO 25GX1X1/2 BEV (NEEDLE) ×1 IMPLANT
NS IRRIG 1000ML POUR BTL (IV SOLUTION) ×1 IMPLANT
PACK TOTAL JOINT (CUSTOM PROCEDURE TRAY) ×1 IMPLANT
PAD ABD 8X10 STRL (GAUZE/BANDAGES/DRESSINGS) IMPLANT
PAD ARMBOARD 7.5X6 YLW CONV (MISCELLANEOUS) ×2 IMPLANT
PAD CAST 4YDX4 CTTN HI CHSV (CAST SUPPLIES) IMPLANT
PADDING CAST COTTON 6X4 STRL (CAST SUPPLIES) IMPLANT
PLATE L.DISTAL 2.7/3.5 7H EVOS (Plate) IMPLANT
SCREW CORT 3.5X14 ST EVOS (Screw) IMPLANT
SCREW CORT 3.5X15 ST EVOS (Screw) IMPLANT
SCREW CORT 3.5X16 ST EVOS (Screw) IMPLANT
SCREW CORT 3.5X19 ST EVOS (Screw) IMPLANT
SCREW CORT 3.5X22 ST EVOS (Screw) IMPLANT
SCREW CORT ST EVOS 3.5X55 (Screw) IMPLANT
SCREW CORT ST EVOS 3.5X75 (Screw) IMPLANT
SCREW CORT ST EVOS 3.5X80 (Screw) IMPLANT
SCREW LOCK ST EVOS 2.7X20 (Screw) IMPLANT
SCREW LOCK ST EVOS 2.7X22 (Screw) IMPLANT
SPLINT PLASTER CAST XFAST 5X30 (CAST SUPPLIES) IMPLANT
SPONGE T-LAP 18X18 ~~LOC~~+RFID (SPONGE) IMPLANT
STAPLER VISISTAT 35W (STAPLE) ×1 IMPLANT
SUCTION TUBE FRAZIER 10FR DISP (SUCTIONS) ×1 IMPLANT
SUT ETHILON 3 0 PS 1 (SUTURE) ×2 IMPLANT
SUT MON AB 2-0 CT1 36 (SUTURE) IMPLANT
SUT PROLENE 0 CT (SUTURE) IMPLANT
SUT VIC AB 0 CT1 27XBRD ANBCTR (SUTURE) ×1 IMPLANT
SUT VIC AB 2-0 CT1 TAPERPNT 27 (SUTURE) ×2 IMPLANT
SYR CONTROL 10ML LL (SYRINGE) ×1 IMPLANT
TOWEL GREEN STERILE (TOWEL DISPOSABLE) ×2 IMPLANT
TOWEL GREEN STERILE FF (TOWEL DISPOSABLE) ×1 IMPLANT
UNDERPAD 30X36 HEAVY ABSORB (UNDERPADS AND DIAPERS) ×1 IMPLANT
WATER STERILE IRR 1000ML POUR (IV SOLUTION) ×1 IMPLANT

## 2023-07-01 NOTE — Progress Notes (Signed)
PROGRESS NOTE  Timothy [name]  DOB: 08/25/1955  PCP: Wendi Maya, MD GUY:[id number]  DOA: 06/25/2023  LOS: 4 days  Hospital Day: 7  Brief narrative: Timothy [name] is a 68 y.o. male with PMH significant for prostate cancer with vertebral mets, s/p back surgery, wheelchair-bound status, neurogenic bladder requiring self cath, chronic fecal incontinence, gonorrhea, schizophrenia, PTSD, behavioral disturbance who follows up at [facility]. 2/6, patient presented to ED with complaint of bilateral leg swelling He states it has been present for months but became much worse yesterday redness and swelling noted left more than right. Patient was apparently seen in the ED on January 4 for a fall.  He underwent x-ray of left knee at that time which was unremarkable and was discharged.  It seems he did not have ankle x-ray done at that time.  Patient reports pain and swelling since that fall.  In the ED, patient was hemodynamically stable CBC/CMP mostly unremarkable Catheter urinalysis showed hazy yellow color urine with small hemoglobin, moderate leukocytes, positive nitrite, many bacteria UDS negative Chest x-ray negative for any acute infiltrates X-ray left ankle showed acute mildly displaced distal fibular shaft fracture, acute mildly displaced medial malleolar fracture, mild posterior subluxation of the talar dome with respect to the distal tibia. EDP discussed with orthopedics Dr. Hulda Humphrey. Admitted to [facility] Orthopedic consulted.  2/12, underwent ORIF of left ankle  Subjective: Patient was seen and examined this morning prior to surgery. Lying on bed.  Not in distress.  No new symptoms.  No family at bedside.  Assessment and plan: Left ankle trimalleolar fracture S/p ORIF -2/12 Dr. Jena Gauss Secondary to a fall 5 weeks ago. Imaging and procedure as above. Pain regimen: Tylenol as needed, oxycodone as needed Bowel regimen: MiraLAX as needed DVT prophylaxis with heparin  subcu  Hypokalemia Potassium level low today again.  Replacement ordered. Recent Labs  Lab 06/25/23 1441 06/26/23 0006 06/28/23 0450 07/01/23 0610  K 3.6 3.3* 3.2* 3.3*   Abnormal urinalysis Neurogenic bladder H/o prostate cancer Abnormal urinalysis likely secondary to chronic self-catheterization  Currently no evidence of infection.  Not on antibiotics Flomax to continue   H/o back surgery Wheelchair-bound status   Fecal incontinence Since back surgery    Schizophrenia Home med list still not complete.  Not sure if he was actually taking Cogentin, Haldol, sertraline. Currently all of them are on hold. 2/12, I have advised pharmacy again to obtain his complete home med list.   Mobility: Chronically bedbound??  PT eval postprocedure.  Goals of care   Code Status: Limited: Do not attempt resuscitation (DNR) -DNR-LIMITED -Do Not Intubate/DNI      DVT prophylaxis:  heparin injection 5,000 Units Start: 06/26/23 1400 SCDs Start: 06/25/23 2303   Antimicrobials: None Fluid: None Consultants: Orthopedics Family Communication: None at beside  Status: Inpatient Level of care:  Med-Surg   Patient is from: Home Needs to continue in-hospital care: POD 0 Anticipated d/c to: Pending clinical course   Diet:  Diet Order             Diet NPO time specified Except for: Sips with Meds  Diet effective midnight                   Scheduled Meds:   ceFAZolin (ANCEF) IV  3 g Intravenous On Call to OR   chlorhexidine  60 mL Topical Once   fentaNYL (SUBLIMAZE) injection  50 mcg Intravenous Once   [MAR Hold] heparin  5,000 Units Subcutaneous Q8H   [  MAR Hold] oxyCODONE  10 mg Oral Once   [MAR Hold] tamsulosin  0.4 mg Oral Daily    PRN meds: [MAR Hold] acetaminophen **OR** [MAR Hold] acetaminophen, [MAR Hold] alum & mag hydroxide-simeth, fentaNYL (SUBLIMAZE) injection, [MAR Hold] labetalol, [MAR Hold] melatonin, [MAR Hold] ondansetron **OR** [MAR Hold] ondansetron  (ZOFRAN) IV, [MAR Hold] oxyCODONE, tranexamic acid   Infusions:   lactated ringers     [MAR Hold] potassium chloride     tranexamic acid     tranexamic acid      Antimicrobials: Anti-infectives (From admission, onward)    Start     Dose/Rate Route Frequency Ordered Stop   07/01/23 1132  vancomycin (VANCOCIN) powder  Status:  Discontinued          As needed 07/01/23 1153 07/01/23 1211   07/01/23 1045  ceFAZolin (ANCEF) IVPB 3g/150 mL premix        3 g 300 mL/hr over 30 Minutes Intravenous On call to O.R. 07/01/23 0946 07/02/23 0559   07/01/23 1017  clindamycin (CLEOCIN) 900 MG/50ML IVPB       Note to Pharmacy: Camillia Herter A: cabinet override      07/01/23 1017 07/01/23 1127   07/01/23 0851  ceFAZolin (ANCEF) 3-4 GM/150ML-% IVPB  Status:  Discontinued       Note to Pharmacy: Shanda Bumps M: cabinet override      07/01/23 0851 07/01/23 1037   07/01/23 0848  ceFAZolin (ANCEF) 2-4 GM/100ML-% IVPB  Status:  Discontinued       Note to Pharmacy: Shanda Bumps M: cabinet override      07/01/23 0848 07/01/23 0852   06/25/23 1630  cefTRIAXone (ROCEPHIN) 1 g in sodium chloride 0.9 % 100 mL IVPB        1 g 200 mL/hr over 30 Minutes Intravenous  Once 06/25/23 1621 06/25/23 1827       Objective: Vitals:   07/01/23 1230 07/01/23 1245  BP: 124/64 126/63  Pulse: 77 75  Resp: 14 15  Temp:    SpO2: 95% 91%    Intake/Output Summary (Last 24 hours) at 07/01/2023 1259 Last data filed at 07/01/2023 1203 Gross per 24 hour  Intake 420 ml  Output 750 ml  Net -330 ml   Filed Weights   06/25/23 1430 07/01/23 0936  Weight: 124 kg 123.8 kg   Weight change:  Body mass index is 35.05 kg/m.   Physical Exam: General exam: Pleasant, elderly African-American male Skin: No rashes, lesions or ulcers. HEENT: Atraumatic, normocephalic, no obvious bleeding Lungs: Clear to auscultation bilaterally,  CVS: S1, S2, no murmur,   GI/Abd: Soft, nontender, nondistended, bowel sound present,    CNS: Alert, awake, slow to respond, answers with few words only Psychiatry: Mood appropriate Extremities: Bilateral chronic lower extremity lymphedema, no calf tenderness, left lower extremity on bandage  Data Review: I have personally reviewed the laboratory data and studies available.  F/u labs  Unresulted Labs (From admission, onward)     Start     Ordered   07/02/23 0500  Basic metabolic panel  Tomorrow morning,   R       Question:  Specimen collection method  Answer:  Lab=Lab collect   07/01/23 0938   07/02/23 0500  CBC with Differential/Platelet  Tomorrow morning,   R       Question:  Specimen collection method  Answer:  Lab=Lab collect   07/01/23 0938   Signed and Held  VITAMIN D 25 Hydroxy (Vit-D Deficiency, Fractures)  Once,  R       Question:  Specimen collection method  Answer:  Lab=Lab collect   Signed and Held   Signed and Held  CBC  (enoxaparin (LOVENOX)    CrCl >/= 30 ml/min)  Once,   R       Comments: Baseline for enoxaparin therapy IF NOT ALREADY DRAWN.  Notify MD if PLT < 100 K.   Question:  Specimen collection method  Answer:  Lab=Lab collect   Signed and Held   Signed and Held  Creatinine, serum  (enoxaparin (LOVENOX)    CrCl >/= 30 ml/min)  Once,   R       Comments: Baseline for enoxaparin therapy IF NOT ALREADY DRAWN.   Question:  Specimen collection method  Answer:  Lab=Lab collect   Signed and Held   Signed and Held  Creatinine, serum  (enoxaparin (LOVENOX)    CrCl >/= 30 ml/min)  Weekly,   R     Comments: while on enoxaparin therapy   Question:  Specimen collection method  Answer:  Lab=Lab collect   Signed and Held            Total time spent in review of labs and imaging, patient evaluation, formulation of plan, documentation and communication with family: 45 minutes  Signed, Lorin Glass, MD [facility] 07/01/2023

## 2023-07-01 NOTE — Progress Notes (Signed)
PT Cancellation Note  Patient Details Name: BEULAH MATUSEK MRN: [id number] DOB: 12-Apr-1956   Cancelled Treatment:    Reason Eval/Treat Not Completed: (P) Patient at procedure or test/unavailable (pt off unit for LLE ORIF.) Will continue efforts per PT plan of care as schedule permits, supervising PT notified in case any WB status changes.   Dorathy Kinsman Lyzette Reinhardt 07/01/2023, 11:32 AM

## 2023-07-01 NOTE — Anesthesia Procedure Notes (Signed)
Procedure Name: Intubation Date/Time: 07/01/2023 10:54 AM  Performed by: Camillia Herter, CRNAPre-anesthesia Checklist: Patient identified, Emergency Drugs available, Suction available and Patient being monitored Patient Re-evaluated:Patient Re-evaluated prior to induction Oxygen Delivery Method: Circle System Utilized Preoxygenation: Pre-oxygenation with 100% oxygen Induction Type: IV induction Ventilation: Mask ventilation without difficulty and Two handed mask ventilation required Laryngoscope Size: Miller and 2 Grade View: Grade I Tube type: Oral Tube size: 7.5 mm Number of attempts: 1 Airway Equipment and Method: Stylet and Oral airway Placement Confirmation: ETT inserted through vocal cords under direct vision, positive ETCO2 and breath sounds checked- equal and bilateral Secured at: 23 cm Tube secured with: Tape Dental Injury: Teeth and Oropharynx as per pre-operative assessment

## 2023-07-01 NOTE — H&P (View-Only) (Signed)
Ortho Trauma Note  Patient seen and examined and has a left ankle fracture dislocation.  He was reduced the swelling was not appropriate for surgical incision last week but has come down and is limited mobility.  We will plan to proceed with open reduction internal fixation.  Risks and benefits were discussed with the patient as well as his sister.  Risks included but not limited to bleeding, infection, malunion, nonunion, hardware failure, hardware rotation, nerve and blood vessel injury, posttraumatic arthritis and ankle stiffness, even the possibility anesthetic complications.  He agrees to proceed with surgery consent was obtained.  Roby Lofts, MD [facility] [phone] (office) [email or web address]

## 2023-07-01 NOTE — Op Note (Signed)
Orthopaedic Surgery Operative Note (CSN: [id number] ) Date of Surgery: 07/01/2023  Admit Date: 06/25/2023   Diagnoses: Pre-Op Diagnoses: Left trimalleolar ankle fracture  Post-Op Diagnosis: Same  Procedures: CPT 27822-Open reduction internal fixation of left trimalleolar ankle fracture CPT 27829-Internal fixation of left syndesmosis  Surgeons : Primary: Roby Lofts, MD  Assistant: Ulyses Southward, PA-C  Location: OR 3   Anesthesia: General with regional block   Antibiotics: Clindamycin 900mg  preop with 1 gm vancomycin powder placed topically   Tourniquet time:None    Estimated Blood Loss: 50 mL  Complications:* No complications entered in OR log *   Specimens:* No specimens in log *   Implants: Implant Name Type Inv. Item Serial No. Manufacturer Lot No. LRB No. Used Action  PLATE L.DISTAL 2.7/3.5 7H EVOS - [id number] Plate PLATE L.DISTAL 2.7/3.5 7H [employer]OS  [employer]  Left 1 Implanted  SCREW CORT 3.5X15 ST EVOS - [id number] Screw SCREW CORT 3.5X15 ST EVOS  [employer]  Left 1 Implanted  SCREW CORT 3.5X22 ST EVOS - [id number] Screw SCREW CORT 3.5X22 ST EVOS  [employer]  Left 1 Implanted  SCREW CORT 3.5X19 ST EVOS - [id number] Screw SCREW CORT 3.5X19 ST EVOS  [employer]  Left 1 Implanted  SCREW CORT 3.5X16 ST EVOS - [id number] Screw SCREW CORT 3.5X16 ST EVOS  [employer]  Left 1 Implanted  SCREW LOCK ST EVOS 2.7X20 - [id number] Screw SCREW LOCK ST EVOS 2.7X20  [employer]  Left 3 Implanted  SCREW LOCK ST EVOS 2.7X22 - [id number] Screw SCREW LOCK ST EVOS 2.7X22  [employer]  Left 1 Implanted  SCREW CORT 3.5X14 ST EVOS - [id number] Screw SCREW CORT 3.5X14 ST EVOS  [employer]  Left 1 Implanted  SCREW CORT ST EVOS 3.5X75 - [id number] Screw SCREW CORT ST EVOS 3.5X75  [employer]  Left 1 Implanted  SCREW CORT ST EVOS 3.5X80 -  [id number] Screw SCREW CORT ST EVOS 3.5X80  [employer]  Left 1 Implanted  SCREW CORT ST EVOS 3.5X55 - [id number] Screw SCREW CORT ST EVOS 3.5X55  [employer]  Left 1 Implanted     Indications for Surgery: 68 year old male who sustained a left ankle fracture with subluxation.  Due to the unstable nature of his injury I recommended proceeding with open reduction internal fixation.  Risks and benefits were discussed with the patient and his family members.  Risks include but not limited to bleeding, infection, malunion, nonunion, hardware failure, hardware irritation, nerve and blood vessel injury, posttraumatic arthritis, stiffness, DVT, and the possibility anesthetic complications.  He agreed to proceed with surgery and consent was obtained.  Operative Findings: 1.  Open reduction internal fixation of left trimalleolar ankle fracture using Smith & Nephew 3.5/2.7 mm distal fibular locking plate and independent 3.5 millimeter screws for the medial malleolus. 2.  Stress examination of the left ankle with extra rotation stress views that showed slight amount of medial clear space widening treated with quadricortical syndesmotic screw  Procedure: The patient was identified in the preoperative holding area. Consent was confirmed with the patient and their family and all questions were answered. The operative extremity was marked after confirmation with the patient. he was then brought back to the operating room by our anesthesia colleagues.  He was placed under anesthetic and carefully transferred over to radiolucent flattop table.  A bump was placed under his operative  hip.  The left lower extremity was then prepped and draped in usual sterile fashion.  A timeout was performed to verify the patient, the procedure, and the extremity.  Preoperative antibiotics were dosed.  Fluoroscopic imaging showed the unstable nature of his injury.  The lateral approach to the distal  fibula was carried down through skin subcutaneous tissue.  I exposed the fracture site cleaned out the hematoma and reduced it anatomically with a reduction tenaculum.  I then positioned a 3.5/2.7 mm Smith & Nephew lateral distal fibular locking plate and confirmed positioning with fluoroscopy.  Then I drilled proximal and distal to the fracture holding it with nonlocking screws.  I placed a total of 3 screws proximal to the fracture site and I placed a number of locking screws distally.  Once I had fixation of the fibula I turned my attention to the medial malleolus.  A slight inversion force was provided and the mortise was anatomic.  I then percutaneously made incisions at the base of the medial malleolus and held the reduction provisionally with a 1.6 mm K wire.  I then used a 2.5 mm drill bit to drill bicortically and placed a 3.5 mm nonlocking screw to hold it in position.  The K wire was removed and another 3.5 millimeter screw was then placed.  I then performed an external rotation stress view which showed a slight amount of medial clear space widening as result I drilled and placed a 3.5 millimeter screw across the fibula and tibia.  Final fluoroscopic imaging was obtained.  The incision was copiously irrigated.  A gram of vancomycin powder was placed into the incision.  Layered closure of 2-0 Monocryl and 3-0 nylon was used to close the skin.  Sterile dressings were applied.  A well-padded short leg splint was then placed.  The patient was then awoke from anesthesia and taken to the PACU in stable condition.  Post Op Plan/Instructions: The patient be nonweightbearing to the left lower extremity.  He will receive postoperative Ancef.  He will be placed on Lovenox for DVT prophylaxis and from an orthopedic standpoint he can be placed on aspirin upon discharge.  Will have him mobilize with physical occupational therapy.  I was present and performed the entire surgery.  Ulyses Southward, PA-C did assist  me throughout the case. An assistant was necessary given the difficulty in approach, maintenance of reduction and ability to instrument the fracture.   Truitt Merle, MD [facility]

## 2023-07-01 NOTE — Progress Notes (Signed)
Ortho Trauma Note  Patient seen and examined and has a left ankle fracture dislocation.  He was reduced the swelling was not appropriate for surgical incision last week but has come down and is limited mobility.  We will plan to proceed with open reduction internal fixation.  Risks and benefits were discussed with the patient as well as his sister.  Risks included but not limited to bleeding, infection, malunion, nonunion, hardware failure, hardware rotation, nerve and blood vessel injury, posttraumatic arthritis and ankle stiffness, even the possibility anesthetic complications.  He agrees to proceed with surgery consent was obtained.  Roby Lofts, MD [facility] [phone] (office) [email or web address]

## 2023-07-01 NOTE — Progress Notes (Signed)
Patient arrived to room from PACU he is alert, placed on continuous pulse ox, VS taken he verbally denies pain, bladder scanned and then in and out due to amount done, pt bed placed in low position, wheels locked, call bell within reach.

## 2023-07-01 NOTE — Progress Notes (Signed)
MEDICATION RELATED CONSULT NOTE - INITIAL   Pharmacy Consult for medication reconciliation Indication: remains incomplete at hospital day 6  Allergies  Allergen Reactions   Penicillins Anaphylaxis    Has patient had a PCN reaction causing immediate rash, facial/tongue/throat swelling, SOB or lightheadedness with hypotension: Yes Has patient had a PCN reaction causing severe rash involving mucus membranes or skin necrosis: No Has patient had a PCN reaction that required hospitalization: Yes Has patient had a PCN reaction occurring within the last 10 years: No If all of the above answers are "NO", then may proceed with Cephalosporin use.    Tamsulosin Other (See Comments)    Weakness, fatigue    Patient Measurements: Height: 6\' 2"  (188 cm) Weight: 123.8 kg (273 lb) IBW/kg (Calculated) : 82.2  Vital Signs: Temp: 97.7 F (36.5 C) (02/12 1300) Temp Source: Oral (02/12 0936) BP: 134/64 (02/12 1300) Pulse Rate: 75 (02/12 1300) Intake/Output from previous day: 02/11 0701 - 02/12 0700 In: 120 [P.O.:120] Out: 850 [Urine:850] Intake/Output from this shift: Total I/O In: 300 [IV Piggyback:300] Out: 50 [Blood:50]  Labs: Recent Labs    07/01/23 0610  WBC 4.8  HGB 12.2*  HCT 35.3*  PLT 387  CREATININE 1.00   Estimated Creatinine Clearance: 100.2 mL/min (by C-G formula based on SCr of 1 mg/dL).   Microbiology: Recent Results (from the past 720 hours)  Urine Culture     Status: Abnormal   Collection Time: 06/17/23  4:00 PM   Specimen: Urine, Clean Catch  Result Value Ref Range Status   Specimen Description URINE, CLEAN CATCH  Final   Special Requests   Final    NONE Reflexed from [id number] Performed at [facility], [address] [place], [place] [address]    Culture (A)  Final    >=100,000 COLONIES/mL CITROBACTER FREUNDII 80,000 COLONIES/mL KLEBSIELLA PNEUMONIAE    Report Status 06/20/2023 FINAL  Final   Organism ID, Bacteria CITROBACTER FREUNDII (A)  Final    Organism ID, Bacteria KLEBSIELLA PNEUMONIAE (A)  Final      Susceptibility   Citrobacter freundii - MIC*    CEFEPIME <=0.12 SENSITIVE Sensitive     CEFTRIAXONE <=0.25 SENSITIVE Sensitive     CIPROFLOXACIN <=0.25 SENSITIVE Sensitive     GENTAMICIN >=16 RESISTANT Resistant     IMIPENEM 0.5 SENSITIVE Sensitive     NITROFURANTOIN <=16 SENSITIVE Sensitive     TRIMETH/SULFA >=320 RESISTANT Resistant     PIP/TAZO <=4 SENSITIVE Sensitive ug/mL    * >=100,000 COLONIES/mL CITROBACTER FREUNDII   Klebsiella pneumoniae - MIC*    AMPICILLIN >=32 RESISTANT Resistant     CEFAZOLIN <=4 SENSITIVE Sensitive     CEFEPIME <=0.12 SENSITIVE Sensitive     CEFTRIAXONE <=0.25 SENSITIVE Sensitive     CIPROFLOXACIN <=0.25 SENSITIVE Sensitive     GENTAMICIN <=1 SENSITIVE Sensitive     IMIPENEM 0.5 SENSITIVE Sensitive     NITROFURANTOIN 32 SENSITIVE Sensitive     TRIMETH/SULFA <=20 SENSITIVE Sensitive     AMPICILLIN/SULBACTAM 4 SENSITIVE Sensitive     PIP/TAZO <=4 SENSITIVE Sensitive ug/mL    * 80,000 COLONIES/mL KLEBSIELLA PNEUMONIAE  Culture, blood (Routine x 2)     Status: None   Collection Time: 06/25/23  2:33 PM   Specimen: BLOOD RIGHT ARM  Result Value Ref Range Status   Specimen Description BLOOD RIGHT ARM  Final   Special Requests   Final    BOTTLES DRAWN AEROBIC AND ANAEROBIC Blood Culture results may not be optimal due to an  inadequate volume of blood received in culture bottles   Culture   Final    NO GROWTH 5 DAYS Performed at [facility], [address], [place], [place] [address]    Report Status 06/30/2023 FINAL  Final  Culture, blood (Routine x 2)     Status: None   Collection Time: 06/25/23 10:57 PM   Specimen: BLOOD RIGHT ARM  Result Value Ref Range Status   Specimen Description BLOOD RIGHT ARM  Final   Special Requests   Final    BOTTLES DRAWN AEROBIC AND ANAEROBIC Blood Culture adequate volume   Culture   Final    NO GROWTH 5 DAYS Performed at [facility], [address] [place], [place] [address]    Report Status 06/30/2023 FINAL  Final  Surgical pcr screen     Status: None   Collection Time: 06/25/23 11:28 PM   Specimen: Nasal Mucosa; Nasal Swab  Result Value Ref Range Status   MRSA, PCR NEGATIVE NEGATIVE Final   Staphylococcus aureus NEGATIVE NEGATIVE Final    Comment: (NOTE) The Xpert SA Assay (FDA approved for NASAL specimens in patients 32 years of age and older), is one component of a comprehensive surveillance program. It is not intended to diagnose infection nor to guide or monitor treatment. Performed at [facility], [address] [place], [place] [address]   Surgical pcr screen     Status: None   Collection Time: 07/01/23  4:21 AM   Specimen: Nasal Mucosa; Nasal Swab  Result Value Ref Range Status   MRSA, PCR NEGATIVE NEGATIVE Final   Staphylococcus aureus NEGATIVE NEGATIVE Final    Comment: (NOTE) The Xpert SA Assay (FDA approved for NASAL specimens in patients 91 years of age and older), is one component of a comprehensive surveillance program. It is not intended to diagnose infection nor to guide or monitor treatment. Performed at [facility], [address] [place], [place] [address]     Medical History: Past Medical History:  Diagnosis Date   Behavioral disorder    Cancer [facility])    prostate cancer & cancer in his spine   Chronic back pain    Constipation    Decreased mobility    uses wheelchair, spinal cancer   Gonorrhea    Impaired mobility    prostate cancer cancer in his spine led to surgery and some decrease use of his lower extremities, neurogenic bladder   Intermittent self-catheterization of bladder    Neurogenic bladder    Paranoid schizophrenia (HCC)    PTSD (post-traumatic stress disorder)     Medications:  Medications Prior to Admission  Medication Sig Dispense Refill Last Dose/Taking   acetaminophen (TYLENOL) 325 MG tablet Take 650 mg by mouth every 6 (six) hours as needed for  mild pain (pain score 1-3) or moderate pain (pain score 4-6).      alprostadil (EDEX) 40 MCG injection 40 mcg by Intracavitary route See admin instructions. Every 4 days as needed for ED      benztropine (COGENTIN) 0.5 MG tablet Take 0.5 mg by mouth daily.      bisacodyl (DULCOLAX) 5 MG EC tablet Take 5 mg by mouth daily as needed for moderate constipation.      Calcium Carb-Cholecalciferol (CALCIUM-VITAMIN D) 500-400 MG-UNIT TABS Take 1 tablet by mouth daily.      cephALEXin (KEFLEX) 500 MG capsule Take 1 capsule (500 mg total) by mouth 4 (four) times daily. 40 capsule 0  cetirizine (ZYRTEC ALLERGY) 10 MG tablet Take 1 tablet (10 mg total) by mouth daily. 30 tablet 2    chlorhexidine (PERIDEX) 0.12 % solution Take 15 mLs by mouth daily.      Cholecalciferol 25 MCG (1000 UT) TBDP Take 1,000 Units by mouth daily.      cyclobenzaprine (FLEXERIL) 10 MG tablet Take 1 tablet (10 mg total) by mouth 2 (two) times daily as needed for muscle spasms. 20 tablet 0    diclofenac (VOLTAREN) 50 MG EC tablet Take 1 tablet (50 mg total) by mouth 2 (two) times daily as needed (pain). 20 tablet 0    diclofenac Sodium (VOLTAREN) 1 % GEL Apply 2 g topically 4 (four) times daily. 50 g 0    diphenhydrAMINE (BENADRYL) 25 mg capsule Take 25 mg by mouth at bedtime as needed for itching or sleep.      fluticasone (FLONASE) 50 MCG/ACT nasal spray Place 2 sprays into both nostrils daily.      furosemide (LASIX) 20 MG tablet Take 1 tablet (20 mg total) by mouth daily for 5 days. 5 tablet 0    gabapentin (NEURONTIN) 300 MG capsule Take 1 capsule (300 mg total) by mouth 3 (three) times daily. 60 capsule 1    haloperidol (HALDOL) 5 MG tablet Take 5 mg by mouth at bedtime. (Patient not taking: Reported on 06/28/2023)   Not Taking   haloperidol decanoate (HALDOL DECANOATE) 50 MG/ML injection Inject 50 mg into the muscle every 28 (twenty-eight) days.      hydroxypropyl methylcellulose / hypromellose (ISOPTO TEARS / GONIOVISC) 2.5 %  ophthalmic solution Place 1 drop into both eyes 3 (three) times daily as needed for dry eyes. 15 mL 12    lactulose (CHRONULAC) 10 GM/15ML solution Take 20 g by mouth 3 (three) times a week.      levofloxacin (LEVAQUIN) 500 MG tablet Take 1.5 tablets (750 mg total) by mouth daily. 5 tablet 0    lisinopril (ZESTRIL) 20 MG tablet Take 20 mg by mouth daily.      mupirocin cream (BACTROBAN) 2 % Apply 1 Application topically 2 (two) times daily. 15 g 0    omeprazole (PRILOSEC) 20 MG capsule Take 40 mg by mouth 2 (two) times daily before a meal.      oxybutynin (DITROPAN) 5 MG tablet Take 5 mg by mouth 3 (three) times daily as needed for bladder spasms.      oxyCODONE (ROXICODONE) 15 MG immediate release tablet Take 15 mg by mouth 3 (three) times daily as needed.      phenazopyridine (PYRIDIUM) 200 MG tablet Take 1 tablet (200 mg total) by mouth 3 (three) times daily. 6 tablet 0    polyethylene glycol (MIRALAX / GLYCOLAX) packet Take 17 g by mouth 3 (three) times a week.      sertraline (ZOLOFT) 50 MG tablet Take 50 mg by mouth daily.      sildenafil (VIAGRA) 100 MG tablet Take 100 mg by mouth daily as needed for erectile dysfunction.       Assessment: 34 YOM admitted 06/25/2023. Multiple attempts to reconcile his home medications to include calls to nephew and sister (documented within the EMR under admission PTA med list. No resolution to his home meds. [facility] Attempts at obtaining medication list from [place] unsuccessful  Plan:  Placed an additional request at attempting to resolve his pre-admission medication list today.    Saw Mendenhall BS, PharmD, BCPS  Clinical Pharmacist 07/01/2023 1:13 PM  Contact: [phone] after 3 PM  "Be curious, not judgmental..." -Debbora Dus

## 2023-07-01 NOTE — Anesthesia Procedure Notes (Signed)
Anesthesia Regional Block: Popliteal block   Pre-Anesthetic Checklist: , timeout performed,  Correct Patient, Correct Site, Correct Laterality,  Correct Procedure, Correct Position, site marked,  Risks and benefits discussed,  Pre-op evaluation,  At surgeon's request and post-op pain management  Laterality: Left  Prep: Maximum Sterile Barrier Precautions used, chloraprep       Needles:  Injection technique: Single-shot  Needle Type: Echogenic Stimulator Needle     Needle Length: 9cm  Needle Gauge: 21     Additional Needles:   Procedures:,,,, ultrasound used (permanent image in chart),,    Narrative:  Start time: 07/01/2023 10:10 AM End time: 07/01/2023 10:13 AM Injection made incrementally with aspirations every 5 mL. Anesthesiologist: Elmer Picker, MD

## 2023-07-01 NOTE — Anesthesia Postprocedure Evaluation (Signed)
Anesthesia Post Note  Patient: [name]  Procedure(s) Performed: OPEN REDUCTION INTERNAL FIXATION (ORIF) ANKLE FRACTURE (Left: Ankle)     Patient location during evaluation: PACU Anesthesia Type: Regional and General Level of consciousness: awake and alert Pain management: pain level controlled Vital Signs Assessment: post-procedure vital signs reviewed and stable Respiratory status: spontaneous breathing, nonlabored ventilation, respiratory function stable and patient connected to nasal cannula oxygen Cardiovascular status: blood pressure returned to baseline and stable Postop Assessment: no apparent nausea or vomiting Anesthetic complications: no  No notable events documented.  Last Vitals:  Vitals:   07/01/23 1300 07/01/23 1322  BP: 134/64 122/65  Pulse: 75 74  Resp: 16 17  Temp: 36.5 C 36.5 C  SpO2: 92% 91%    Last Pain:  Vitals:   07/01/23 1322  TempSrc: Oral  PainSc:                  Timothy [name] Timothy [name]

## 2023-07-01 NOTE — Anesthesia Preprocedure Evaluation (Addendum)
Anesthesia Evaluation  Patient identified by MRN, date of birth, ID band Patient awake    Reviewed: Allergy & Precautions, NPO status , Patient's Chart, lab work & pertinent test results  Airway Mallampati: III  TM Distance: >3 FB Neck ROM: Full    Dental no notable dental hx. (+) Teeth Intact, Dental Advisory Given   Pulmonary former smoker   Pulmonary exam normal breath sounds clear to auscultation       Cardiovascular hypertension, Normal cardiovascular exam Rhythm:Regular Rate:Normal     Neuro/Psych  PSYCHIATRIC DISORDERS Anxiety   Schizophrenia  negative neurological ROS     GI/Hepatic negative GI ROS, Neg liver ROS,,,  Endo/Other  negative endocrine ROS    Renal/GU negative Renal ROS   Neurogenic bladder negative genitourinary   Musculoskeletal negative musculoskeletal ROS (+)    Abdominal   Peds  Hematology negative hematology ROS (+)   Anesthesia Other Findings 68 y.o. male with PMH significant for prostate cancer with vertebral mets, s/p back surgery, wheelchair-bound status, neurogenic bladder requiring self cath, chronic fecal incontinence, gonorrhea, schizophrenia, PTSD, behavioral disturbance  Reproductive/Obstetrics                             Anesthesia Physical Anesthesia Plan  ASA: 2  Anesthesia Plan: General and Regional   Post-op Pain Management: Regional block* and Tylenol PO (pre-op)*   Induction: Intravenous  PONV Risk Score and Plan: 2 and Midazolam, Dexamethasone and Ondansetron  Airway Management Planned: LMA  Additional Equipment:   Intra-op Plan:   Post-operative Plan: Extubation in OR  Informed Consent: I have reviewed the patients History and Physical, chart, labs and discussed the procedure including the risks, benefits and alternatives for the proposed anesthesia with the patient or authorized representative who has indicated his/her understanding  and acceptance.   Patient has DNR.  Discussed DNR with patient and Suspend DNR.   Dental advisory given  Plan Discussed with: CRNA  Anesthesia Plan Comments:        Anesthesia Quick Evaluation

## 2023-07-01 NOTE — Anesthesia Procedure Notes (Signed)
Anesthesia Regional Block: Adductor canal block   Pre-Anesthetic Checklist: , timeout performed,  Correct Patient, Correct Site, Correct Laterality,  Correct Procedure, Correct Position, site marked,  Risks and benefits discussed,  Pre-op evaluation,  At surgeon's request and post-op pain management  Laterality: Left  Prep: Maximum Sterile Barrier Precautions used, chloraprep       Needles:  Injection technique: Single-shot  Needle Type: Echogenic Stimulator Needle     Needle Length: 9cm  Needle Gauge: 21     Additional Needles:   Procedures:,,,, ultrasound used (permanent image in chart),,    Narrative:  Start time: 07/01/2023 10:13 AM End time: 07/01/2023 10:16 AM Injection made incrementally with aspirations every 5 mL. Anesthesiologist: Elmer Picker, MD

## 2023-07-01 NOTE — Transfer of Care (Signed)
Immediate Anesthesia Transfer of Care Note  Patient: Timothy [name]  Procedure(s) Performed: OPEN REDUCTION INTERNAL FIXATION (ORIF) ANKLE FRACTURE (Left: Ankle)  Patient Location: PACU  Anesthesia Type:General  Level of Consciousness: awake  Airway & Oxygen Therapy: Patient Spontanous Breathing  Post-op Assessment: Report given to RN and Post -op Vital signs reviewed and stable  Post vital signs: Reviewed and stable  Last Vitals:  Vitals Value Taken Time  BP 124/69 07/01/23 1215  Temp    Pulse 83 07/01/23 1221  Resp 19 07/01/23 1221  SpO2 91 % 07/01/23 1221  Vitals shown include unfiled device data.  Last Pain:  Vitals:   07/01/23 0958  TempSrc:   PainSc: 0-No pain      Patients Stated Pain Goal: 2 (06/28/23 2104)  Complications: No notable events documented.

## 2023-07-01 NOTE — Interval H&P Note (Signed)
History and Physical Interval Note:  07/01/2023 9:52 AM  Timothy [name]  has presented today for surgery, with the diagnosis of left ankle fracture.  The various methods of treatment have been discussed with the patient and family. After consideration of risks, benefits and other options for treatment, the patient has consented to  Procedure(s): OPEN REDUCTION INTERNAL FIXATION (ORIF) ANKLE FRACTURE (Left) as a surgical intervention.  The patient's history has been reviewed, patient examined, no change in status, stable for surgery.  I have reviewed the patient's chart and labs.  Questions were answered to the patient's satisfaction.     Caryn Bee P Saje Gallop

## 2023-07-02 ENCOUNTER — Other Ambulatory Visit (HOSPITAL_COMMUNITY): Payer: Self-pay

## 2023-07-02 DIAGNOSIS — S82852A Displaced trimalleolar fracture of left lower leg, initial encounter for closed fracture: Secondary | ICD-10-CM | POA: Diagnosis not present

## 2023-07-02 LAB — BASIC METABOLIC PANEL
Anion gap: 13 (ref 5–15)
BUN: 10 mg/dL (ref 8–23)
CO2: 23 mmol/L (ref 22–32)
Calcium: 9.2 mg/dL (ref 8.9–10.3)
Chloride: 102 mmol/L (ref 98–111)
Creatinine, Ser: 1 mg/dL (ref 0.61–1.24)
GFR, Estimated: >60 mL/min (ref >60)
Glucose, Bld: 155 mg/dL — ABNORMAL HIGH (ref 70–99)
Potassium: 3.8 mmol/L (ref 3.5–5.1)
Sodium: 138 mmol/L (ref 135–145)

## 2023-07-02 LAB — CBC WITH DIFFERENTIAL/PLATELET
Abs Immature Granulocytes: 0.07 10*3/uL (ref 0.00–0.07)
Basophils Absolute: 0 10*3/uL (ref 0.0–0.1)
Basophils Relative: 0 %
Eosinophils Absolute: 0 10*3/uL (ref 0.0–0.5)
Eosinophils Relative: 0 %
HCT: 34.1 % — ABNORMAL LOW (ref 39.0–52.0)
Hemoglobin: 11.8 g/dL — ABNORMAL LOW (ref 13.0–17.0)
Immature Granulocytes: 1 %
Lymphocytes Relative: 6 %
Lymphs Abs: 0.6 10*3/uL — ABNORMAL LOW (ref 0.7–4.0)
MCH: 30 pg (ref 26.0–34.0)
MCHC: 34.6 g/dL (ref 30.0–36.0)
MCV: 86.8 fL (ref 80.0–100.0)
Monocytes Absolute: 0.7 10*3/uL (ref 0.1–1.0)
Monocytes Relative: 7 %
Neutro Abs: 8.9 10*3/uL — ABNORMAL HIGH (ref 1.7–7.7)
Neutrophils Relative %: 86 %
Platelets: 434 10*3/uL — ABNORMAL HIGH (ref 150–400)
RBC: 3.93 MIL/uL — ABNORMAL LOW (ref 4.22–5.81)
RDW: 11.9 % (ref 11.5–15.5)
WBC: 10.3 10*3/uL (ref 4.0–10.5)
nRBC: 0 % (ref 0.0–0.2)

## 2023-07-02 MED ORDER — CYCLOBENZAPRINE HCL 10 MG PO TABS: 10.0000 mg | ORAL_TABLET | Freq: Two times a day (BID) | ORAL | 0 refills | Status: AC | PRN

## 2023-07-02 MED ORDER — ASPIRIN 325 MG PO TBEC: 325.0000 mg | DELAYED_RELEASE_TABLET | Freq: Every day | ORAL | 0 refills | Status: AC

## 2023-07-02 MED ORDER — OXYCODONE HCL 10 MG PO TABS: 10.0000 mg | ORAL_TABLET | ORAL | 0 refills | Status: AC | PRN

## 2023-07-02 NOTE — TOC Progression Note (Signed)
Transition of Care [facility]) - Progression Note    Patient Details  Name: Timothy [name] MRN: [id number] Date of Birth: [birth year]  Transition of [facility]) CM/SW Contact  Lorri Frederick, LCSW Phone Number: 07/02/2023, 10:39 AM  Clinical Narrative:   CSW met with pt regarding SNF bed offers, which were provided on medicare choice document.  Pt wants to accept offer at [facility].  CSW notified Christine/Linden, who is checking what Berkley Harvey will be needed.   TC Christine: they cannot accept [facility], will have to rescind offer.  CSW spoke with Rhonda/[facility]--[facility] with [facility]  CSW spoke with [facility] health and was directed to their website.  FOllowing providers are in network with [facility]: [place], [place], [place], [address], [address], [place], [place], [place], [facility] [place] [facility], [place], [place], [place].  CSW spoke with [facility]onnie [facility].  Pt is eligible for SNF through [place], she will send referral docs required.  Pt is also able to use his devoted health coverage for SNF placement.      Expected Discharge Plan: Skilled Nursing Facility Barriers to Discharge: Continued Medical Work up, English as a second language teacher, SNF Pending bed offer  Expected Discharge Plan and Services In-house Referral: Clinical Social Work   Post Acute Care Choice: Skilled Nursing Facility Living arrangements for the past 2 months: Single Family Home                                       Social Determinants of Health (SDOH) Interventions SDOH Screenings   Food Insecurity: Patient Declined (06/26/2023)  Housing: Patient Declined (06/26/2023)  Transportation Needs: Patient Declined (06/26/2023)  Utilities: Patient Declined (06/26/2023)  Social Connections: Unknown (06/26/2023)  Tobacco Use: Medium Risk (07/01/2023)    Readmission Risk Interventions     No data to display

## 2023-07-02 NOTE — Plan of Care (Signed)

## 2023-07-02 NOTE — Progress Notes (Signed)
PROGRESS NOTE  ANDREWJAMES [name]  DOB: Jun 12, 1955  PCP: Wendi Maya, MD HYQ:[id number]  DOA: 06/25/2023  LOS: 5 days  Hospital Day: 8  Brief narrative: Timothy [name] is a 68 y.o. male with PMH significant for prostate cancer with vertebral mets, s/p back surgery, wheelchair-bound status, neurogenic bladder requiring self cath, chronic fecal incontinence, gonorrhea, schizophrenia, PTSD, behavioral disturbance who follows up at [facility] [place]. 2/6, patient presented to ED with complaint of bilateral leg swelling He states it has been present for months but became much worse yesterday redness and swelling noted left more than right. Patient was apparently seen in the ED on January 4 for a fall.  He underwent x-ray of left knee at that time which was unremarkable and was discharged.  It seems he did not have ankle x-ray done at that time.  Patient reports pain and swelling since that fall.  In the ED, patient was hemodynamically stable CBC/CMP mostly unremarkable Catheter urinalysis showed hazy yellow color urine with small hemoglobin, moderate leukocytes, positive nitrite, many bacteria UDS negative Chest x-ray negative for any acute infiltrates X-ray left ankle showed acute mildly displaced distal fibular shaft fracture, acute mildly displaced medial malleolar fracture, mild posterior subluxation of the talar dome with respect to the distal tibia. EDP discussed with orthopedics Dr. Hulda Humphrey. Admitted to [facility] Orthopedic consulted.  2/12, underwent ORIF of left ankle  Subjective: Patient seen and examined.  Denies any complaints.  Poor historian.  He is wondering when he is going home.  Assessment and plan: Left ankle trimalleolar fracture S/p ORIF -2/12 Dr. Jena Gauss Secondary to a fall 5 weeks ago. Pain regimen: Tylenol as needed, oxycodone as needed Bowel regimen: MiraLAX as needed DVT prophylaxis with Lovenox in the hospital.  Aspirin on discharge.  Hypokalemia Potassium improved with  replacement. Recent Labs  Lab 06/25/23 1441 06/26/23 0006 06/28/23 0450 07/01/23 0610 07/02/23 0537  K 3.6 3.3* 3.2* 3.3* 3.8   Abnormal urinalysis Neurogenic bladder H/o prostate cancer Abnormal urinalysis likely secondary to chronic self-catheterization  Currently no evidence of infection.  Not on antibiotics Flomax to continue   H/o back surgery Wheelchair-bound status   Fecal incontinence Since back surgery    Schizophrenia Home med list still not complete.  Not sure if he was actually taking Cogentin, Haldol, sertraline. Currently all of them are on hold. Waiting to pharmacy to verify.   Mobility: Chronically bedbound??  PT eval postprocedure.  SNF placement.  Goals of care   Code Status: Limited: Do not attempt resuscitation (DNR) -DNR-LIMITED -Do Not Intubate/DNI      DVT prophylaxis:  enoxaparin (LOVENOX) injection 40 mg Start: 07/02/23 0800 SCDs Start: 07/01/23 1324 SCDs Start: 06/25/23 2303   Antimicrobials: None Fluid: None Consultants: Orthopedics Family Communication: None at beside  Status: Inpatient Level of care:  Med-Surg   Patient is from: Home Needs to continue in-hospital care: Admitted postop. Anticipated d/c to: Skilled nursing facility when bed available.   Diet:  Diet Order             Diet regular Room service appropriate? Yes; Fluid consistency: Thin  Diet effective now                   Scheduled Meds:  acetaminophen  650 mg Oral Q6H   Or   acetaminophen  650 mg Rectal Q6H   cholecalciferol  1,000 Units Oral Daily   docusate sodium  100 mg Oral BID   enoxaparin (LOVENOX) injection  40 mg Subcutaneous  Q24H   tamsulosin  0.4 mg Oral Daily    PRN meds: alum & mag hydroxide-simeth, cyclobenzaprine, labetalol, melatonin, metoCLOPramide **OR** metoCLOPramide (REGLAN) injection, ondansetron **OR** ondansetron (ZOFRAN) IV, oxyCODONE, polyethylene glycol   Infusions:     Antimicrobials: Anti-infectives (From  admission, onward)    Start     Dose/Rate Route Frequency Ordered Stop   07/01/23 2300  vancomycin (VANCOCIN) IVPB 1000 mg/200 mL premix        1,000 mg 200 mL/hr over 60 Minutes Intravenous  Once 07/01/23 1432 07/02/23 0121   07/01/23 1415  vancomycin (VANCOCIN) IVPB 1000 mg/200 mL premix  Status:  Discontinued        1,000 mg 200 mL/hr over 60 Minutes Intravenous Every 12 hours 07/01/23 1323 07/01/23 1432   07/01/23 1132  vancomycin (VANCOCIN) powder  Status:  Discontinued          As needed 07/01/23 1153 07/01/23 1211   07/01/23 1045  ceFAZolin (ANCEF) IVPB 3g/150 mL premix  Status:  Discontinued        3 g 300 mL/hr over 30 Minutes Intravenous On call to O.R. 07/01/23 0946 07/01/23 1321   07/01/23 1017  clindamycin (CLEOCIN) 900 MG/50ML IVPB       Note to Pharmacy: Camillia Herter A: cabinet override      07/01/23 1017 07/01/23 1127   07/01/23 0851  ceFAZolin (ANCEF) 3-4 GM/150ML-% IVPB  Status:  Discontinued       Note to Pharmacy: Shanda Bumps M: cabinet override      07/01/23 0851 07/01/23 1037   07/01/23 0848  ceFAZolin (ANCEF) 2-4 GM/100ML-% IVPB  Status:  Discontinued       Note to Pharmacy: Shanda Bumps M: cabinet override      07/01/23 0848 07/01/23 0852   06/25/23 1630  cefTRIAXone (ROCEPHIN) 1 g in sodium chloride 0.9 % 100 mL IVPB        1 g 200 mL/hr over 30 Minutes Intravenous  Once 06/25/23 1621 06/25/23 1827       Objective: Vitals:   07/02/23 0400 07/02/23 0750  BP: 133/73 119/64  Pulse: 64 (!) 59  Resp: 18 16  Temp: 97.8 F (36.6 C) 98.2 F (36.8 C)  SpO2: 96% 95%    Intake/Output Summary (Last 24 hours) at 07/02/2023 1352 Last data filed at 07/02/2023 1200 Gross per 24 hour  Intake 2008.48 ml  Output 1825 ml  Net 183.48 ml   Filed Weights   06/25/23 1430 07/01/23 0936  Weight: 124 kg 123.8 kg   Weight change:  Body mass index is 35.05 kg/m.   Physical Exam: General exam: Pleasant and talkative. Skin: No rashes, lesions or  ulcers. HEENT: Atraumatic, normocephalic, no obvious bleeding Lungs: Clear to auscultation bilaterally,  CVS: S1, S2, no murmur,   GI/Abd: Soft, nontender, nondistended, bowel sound present,   CNS: Alert, awake, slow to respond, answers with few words only Psychiatry: Mood appropriate Extremities: Bilateral chronic lower extremity lymphedema, no calf tenderness, left ankle on postoperative dressing, posterior splint present.  Data Review: I have personally reviewed the laboratory data and studies available.  F/u labs  Unresulted Labs (From admission, onward)     Start     Ordered   07/08/23 0500  Creatinine, serum  (enoxaparin (LOVENOX)    CrCl >/= 30 ml/min)  Weekly,   R     Comments: while on enoxaparin therapy   Question:  Specimen collection method  Answer:  Lab=Lab collect   07/01/23 1323  Total time spent in review of labs and imaging, patient evaluation, formulation of plan, documentation and communication with family: 35 minutes  Signed, Dorcas Carrow, MD [facility] 07/02/2023

## 2023-07-02 NOTE — Discharge Instructions (Signed)
Truitt Merle, MD Thyra Breed PA-C [facility] [address] [phone] Jani Files)   [phone] (fax)                                  POST-OPERATIVE INSTRUCTIONS     WEIGHT BEARING STATUS: Nonweightbearing left lower extremity  RANGE OF MOTION/ACTIVITY: Okay for knee range of motion.  Maintain splint to lower leg  WOUND CARE Please keep splint clean dry and intact until follow-up. If your splint gets wet for any reason please contact the office immediately.  Do not stick anything down your splint such as pencils, momey, hangers to try and scratch yourself.  If you feel itchy take Benadryl as prescribed on the bottle for itching You may shower on Post-Op Day #2.  You must keep splint dry during this process and may find that a plastic bag taped around the extremity or alternatively a towel based bath may be a better option.   If you get your splint wet or if it is damaged please contact our clinic.  EXERCISES Due to your splint being in place you will not be able to bear weight through your extremity.   DO NOT PUT ANY WEIGHT ON YOUR OPERATIVE LEG Please use crutches or a walker to avoid weight bearing.   DVT/PE prophylaxis: Aspirin 325 mg daily x 30 days  DIET: As you were eating previously.  Can use over the counter stool softeners and bowel preparations, such as Miralax, to help with bowel movements.  Narcotics can be constipating.  Be sure to drink plenty of fluids  REGIONAL ANESTHESIA (NERVE BLOCKS) The anesthesia team may have performed a nerve block for you if safe in the setting of your care.  This is a great tool used to minimize pain.  Typically the block may start wearing off overnight but the long acting medicine may last for 3-4 days.  The nerve block wearing off can be a challenging period but please utilize your as needed pain medications to try and manage this period.    POST-OP MEDICATIONS- Multimodal approach to pain control  In general  your pain will be controlled with a combination of substances.  Prescriptions unless otherwise discussed are electronically sent to your pharmacy.  This is a carefully made plan we use to minimize narcotic use.     - Acetaminophen - Non-narcotic pain medicine taken on a scheduled basis   - Oxycodone - This is a strong narcotic, to be used only on an "as needed" basis for pain.  -  Aspirin 325 mg - This medicine is used to minimize the risk of blood clots after surgery.   FOLLOW-UP If you develop a Fever (>101.5), Redness or Drainage from the surgical incision site, please call our office to arrange for an evaluation. Please call the office to schedule a follow-up appointment for your incision check if you do not already have one, 7-10 days post-operatively.   VISIT OUR WEBSITE FOR ADDITIONAL INFORMATION: [email or web address]   HELPFUL INFORMATION  If you had a block, it will wear off between 8-24 hrs postop typically.  This is period when your pain may go from nearly zero to the pain you would have had postop without the block.  This is an abrupt transition but nothing dangerous is happening.  You may take an extra dose of narcotic when this happens.  You should wean off your narcotic medicines as soon  as you are able.  Most patients will be off or using minimal narcotics before their first postop appointment.   We suggest you use the pain medication the first night prior to going to bed, in order to ease any pain when the anesthesia wears off. You should avoid taking pain medications on an empty stomach as it will make you nauseous.  Do not drink alcoholic beverages or take illicit drugs when taking pain medications.  In most states it is against the law to drive while you are in a splint or sling.  And certainly against the law to drive while taking narcotics.  You may return to work/school in the next couple of days when you feel up to it.   Pain medication may make you constipated.   Below are a few solutions to try in this order: Decrease the amount of pain medication if you aren't having pain. Drink lots of decaffeinated fluids. Drink prune juice and/or each dried prunes  If the first 3 don't work start with additional solutions Take Colace - an over-the-counter stool softener Take Senokot - an over-the-counter laxative Take Miralax - a stronger over-the-counter laxative

## 2023-07-02 NOTE — Progress Notes (Signed)
Mobility Specialist Progress Note:    07/02/23 1200  Mobility  Activity Transferred from chair to bed  Level of Assistance Maximum assist, patient does 25-49% (+2)  Assistive Device MaxiMove  Activity Response Tolerated well  Mobility Referral Yes  Mobility visit 1 Mobility  Mobility Specialist Start Time (ACUTE ONLY) 1200  Mobility Specialist Stop Time (ACUTE ONLY) 1213  Mobility Specialist Time Calculation (min) (ACUTE ONLY) 13 min   Pt received in chair, MS assisted nursing staff getting patient back to bed. Pt required the maximove to get back to bed. No c/o throughout. Pt was able to sit up for care team to remove lift pad. Situated in bed w/ call bell and personal belongings in reach. All needs met. RN in room.   Timothy [name] Mobility Specialist  Please contact vis Secure Chat or  Rehab Office [phone]

## 2023-07-02 NOTE — Progress Notes (Signed)
Physical Therapy Treatment Patient Details Name: Timothy [name] MRN: [id number] DOB: [birth year] Today's Date: 07/02/2023   History of Present Illness 68 y/o male presents to [facility] ED on 2/6 for bilateral leg swelling/pain, redness and swelling noted to L>R. Imaging findings result in trimalleolar fx of the L ankle, mildly displaced distal fibular shaft fx, mild posterior subluxation of talar dome. S/p ORIF of L trimalleolar ankle fx and internal fixation of L syndesmosis 2/12. PMH includes history of schizophrenia, chronic neurogenic bladder with a history of self catheterizations, chronic fecal incontinence, history of chronic back pain, prostate cx w/ mets to spine    PT Comments  Pt in bed upon arrival and agreeable to PT session. Worked on safety with transfers and LE strength in today's session. Pt continues to have difficulty recalling WB precautions despite reminders and demonstrations. Attempted two partial stands with pt unable to clear bottom or maintain WB precautions. Pt was able to perform a lateral scoot to the recliner with MaxAx2 and use of bed pad. PT assisted to keep LLE NWB as pt attempted to push with LLE during transfer. Transfer from elevated bed surface with hoyer lift pad underneath pt. Pt able to minimally assist by pushing with BUE. Pt is progressing towards goals. Acute PT to follow.      If plan is discharge home, recommend the following: Two people to help with walking and/or transfers;Two people to help with bathing/dressing/bathroom;Direct supervision/assist for medications management;Help with stairs or ramp for entrance;Assist for transportation;Assistance with cooking/housework;Direct supervision/assist for financial management;Supervision due to cognitive status   Can travel by private vehicle     No  Equipment Recommendations  Other (comment) (TBD)       Precautions / Restrictions Precautions Precautions: Fall Recall of Precautions/Restrictions:  Impaired Precaution/Restrictions Comments: not able to recall WB precautions, attempts to use L LE with transfers Required Braces or Orthoses: Splint/Cast Splint/Cast: Stirrup splint, Post (short leg) splint Splint/Cast - Date Prophylactic Dressing Applied (if applicable): 06/25/23 Restrictions Weight Bearing Restrictions Per Provider Order: Yes LLE Weight Bearing Per Provider Order: Non weight bearing     Mobility  Bed Mobility Overal bed mobility: Needs Assistance Bed Mobility: Rolling, Supine to Sit Rolling: Min assist   Supine to sit: Used rails, HOB elevated, Mod assist (pt able to transfer to sitting EOB on R side of bed with extra time, use of bed rails, HOB elevated)     General bed mobility comments: Able to roll to the right with MinA and use of bed pad, ModA for sidelying to sit to raise trunk and assist with bringing LLE off EOB. Cues to scoot forwards. Pt able to laterally scoot towards HOB with CGA and assist to keep LLE NWB    Transfers Overall transfer level: Needs assistance Equipment used: Rolling walker (2 wheels), None Transfers: Sit to/from Stand, Bed to chair/wheelchair/BSC Sit to Stand: Total assist, +2 physical assistance, From elevated surface       Lateral/Scoot Transfers: Max assist, +2 physical assistance, +2 safety/equipment, From elevated surface General transfer comment: Attempted STS from elevated EOB with PT supporting LLE to help maintain NWB LLE, RLE also blocked as R LE tends to buckle when attempting to stand. x2 attempts with difficulty clearing bottom. Performed lateral scoot with use of bed pad and PT support LLE. MaxAx2 with pt able to minimally push with B UE. In chair with hoyer lift pad underneath pt    Ambulation/Gait    General Gait Details: deferred  Balance Overall balance assessment: Needs assistance Sitting-balance support: Feet supported, Bilateral upper extremity supported, Single extremity supported Sitting  balance-Leahy Scale: Poor Sitting balance - Comments: benefits from BUE support         Communication Communication Communication: No apparent difficulties  Cognition Arousal: Alert Behavior During Therapy: Flat affect   PT - Cognitive impairments: No family/caregiver present to determine baseline, Problem solving, Safety/Judgement, Memory, Sequencing      PT - Cognition Comments: Insists he is able to stand, unable to comprehend or adhere to WB precautions. Following commands: Impaired Following commands impaired: Only follows one step commands consistently, Follows one step commands with increased time    Cueing Cueing Techniques: Verbal cues, Tactile cues  Exercises General Exercises - Lower Extremity Ankle Circles/Pumps: AROM, Right, 10 reps, Supine Quad Sets: AROM, Both, 5 reps, Supine Long Arc Quad: AROM, Both, 10 reps, Seated    General Comments General comments (skin integrity, edema, etc.): VSS on RA      Pertinent Vitals/Pain Pain Assessment Pain Assessment: No/denies pain    Home Living Family/patient expects to be discharged to:: Private residence Living Arrangements: Other relatives (brother and nephew) Available Help at Discharge: Family;Available 24 hours/day Type of Home: House Home Access: Ramped entrance       Home Layout: One level Home Equipment: Agricultural consultant (2 wheels);Wheelchair - manual;BSC/3in1;Grab bars - tub/shower          PT Goals (current goals can now be found in the care plan section) Acute Rehab PT Goals PT Goal Formulation: With patient Time For Goal Achievement: 07/11/23 Potential to Achieve Goals: Fair Progress towards PT goals: Progressing toward goals    Frequency    Min 1X/week       AM-PAC PT "6 Clicks" Mobility   Outcome Measure  Help needed turning from your back to your side while in a flat bed without using bedrails?: A Little Help needed moving from lying on your back to sitting on the side of a flat bed  without using bedrails?: A Lot Help needed moving to and from a bed to a chair (including a wheelchair)?: Total Help needed standing up from a chair using your arms (e.g., wheelchair or bedside chair)?: Total Help needed to walk in hospital room?: Total Help needed climbing 3-5 steps with a railing? : Total 6 Click Score: 9    End of Session Equipment Utilized During Treatment: Gait belt Activity Tolerance: Patient tolerated treatment well Patient left: in chair;with call bell/phone within reach;with chair alarm set Nurse Communication: Mobility status;Need for lift equipment PT Visit Diagnosis: Other abnormalities of gait and mobility (R26.89);History of falling (Z91.81);Muscle weakness (generalized) (M62.81)     Time: 0830-0900 PT Time Calculation (min) (ACUTE ONLY): 30 min  Charges:    $Therapeutic Exercise: 8-22 mins $Therapeutic Activity: 8-22 mins PT General Charges $$ ACUTE PT VISIT: 1 Visit                     Hilton Cork, PT, DPT Secure Chat Preferred  Rehab Office [phone]    Arturo Morton Brion Aliment 07/02/2023, 10:32 AM

## 2023-07-02 NOTE — Progress Notes (Signed)
Orthopaedic Trauma Progress Note  SUBJECTIVE: Doing okay this morning.  Pain currently well-controlled.  Nerve blocks to partially and affect.  Has not been up out of bed yet since surgery.  No chest pain. No SOB. No nausea/vomiting. No other complaints.  Tolerating diet and fluids.  Will saline lock IV today.  OBJECTIVE:  Vitals:   07/02/23 0400 07/02/23 0750  BP: 133/73 119/64  Pulse: 64 (!) 59  Resp: 18 16  Temp: 97.8 F (36.6 C) 98.2 F (36.8 C)  SpO2: 96% 95%    General: Sitting up in bed, no acute distress Respiratory: No increased work of breathing.  Left lower extremity: Well-padded, well-fitting splint in place.  Nontender above splint.  Decreased sensation and motor function over the toes, regional nerve block still partially and affect.  Toes warm and well-perfused.  IMAGING: Stable post op imaging.   LABS:  Results for orders placed or performed during the hospital encounter of 06/25/23 (from the past 24 hours)  VITAMIN D 25 Hydroxy (Vit-D Deficiency, Fractures)     Status: Abnormal   Collection Time: 07/01/23  3:01 PM  Result Value Ref Range   Vit D, 25-Hydroxy 28.44 (L) 30 - 100 ng/mL  CBC     Status: Abnormal   Collection Time: 07/01/23  3:01 PM  Result Value Ref Range   WBC 7.0 4.0 - 10.5 K/uL   RBC 4.30 4.22 - 5.81 MIL/uL   Hemoglobin 13.0 13.0 - 17.0 g/dL   HCT 16.1 (L) 09.6 - 04.5 %   MCV 86.7 80.0 - 100.0 fL   MCH 30.2 26.0 - 34.0 pg   MCHC 34.9 30.0 - 36.0 g/dL   RDW 40.9 81.1 - 91.4 %   Platelets 436 (H) 150 - 400 K/uL   nRBC 0.0 0.0 - 0.2 %  Creatinine, serum     Status: None   Collection Time: 07/01/23  3:01 PM  Result Value Ref Range   Creatinine, Ser 1.05 0.61 - 1.24 mg/dL   GFR, Estimated >78 >29 mL/min  Basic metabolic panel     Status: Abnormal   Collection Time: 07/02/23  5:37 AM  Result Value Ref Range   Sodium 138 135 - 145 mmol/L   Potassium 3.8 3.5 - 5.1 mmol/L   Chloride 102 98 - 111 mmol/L   CO2 23 22 - 32 mmol/L   Glucose, Bld  155 (H) 70 - 99 mg/dL   BUN 10 8 - 23 mg/dL   Creatinine, Ser 5.62 0.61 - 1.24 mg/dL   Calcium 9.2 8.9 - 13.0 mg/dL   GFR, Estimated >86 >57 mL/min   Anion gap 13 5 - 15  CBC with Differential/Platelet     Status: Abnormal   Collection Time: 07/02/23  5:37 AM  Result Value Ref Range   WBC 10.3 4.0 - 10.5 K/uL   RBC 3.93 (L) 4.22 - 5.81 MIL/uL   Hemoglobin 11.8 (L) 13.0 - 17.0 g/dL   HCT 84.6 (L) 96.2 - 95.2 %   MCV 86.8 80.0 - 100.0 fL   MCH 30.0 26.0 - 34.0 pg   MCHC 34.6 30.0 - 36.0 g/dL   RDW 84.1 32.4 - 40.1 %   Platelets 434 (H) 150 - 400 K/uL   nRBC 0.0 0.0 - 0.2 %   Neutrophils Relative % 86 %   Neutro Abs 8.9 (H) 1.7 - 7.7 K/uL   Lymphocytes Relative 6 %   Lymphs Abs 0.6 (L) 0.7 - 4.0 K/uL   Monocytes Relative 7 %  Monocytes Absolute 0.7 0.1 - 1.0 K/uL   Eosinophils Relative 0 %   Eosinophils Absolute 0.0 0.0 - 0.5 K/uL   Basophils Relative 0 %   Basophils Absolute 0.0 0.0 - 0.1 K/uL   Immature Granulocytes 1 %   Abs Immature Granulocytes 0.07 0.00 - 0.07 K/uL    ASSESSMENT: Timothy [name] is a 68 y.o. male, 1 Day Post-Op s/p OPEN REDUCTION INTERNAL FIXATION LEFT ANKLE FRACTURE  CV/Blood loss: Acute blood loss anemia, Hgb 11.8 this morning. Hemodynamically stable  PLAN: Weightbearing: NWB LLE ROM: Okay for knee motion.  Maintain splint to lower leg Incisional and dressing care: Dressings left intact until follow-up  Showering: Okay to shower, splint must stay clean and dry Orthopedic device(s): Splint LLE Pain management:  1. Tylenol 650 mg q 6 hours scheduled 2. Flexeril 10 mg BID PRN 3. Oxycodone 10-15 mg q 4 hours PRN 4. Dilaudid 1 mg q 3 hours PRN VTE prophylaxis: Lovenox, SCDs ID: Vancomycin post op Foley/Lines:  No foley, KVO IVFs Impediments to Fracture Healing: Vitamin D level 28, Continue on home dose supplementation Dispo: PT/OT evaluation ongoing, currently recommending SNF.  Okay for discharge from ortho standpoint once cleared by medicine  team and therapies.  I have signed and placed discharge Rx for pain medication, DVT prophylaxis, muscle relaxer in patient's chart  D/C recommendations: -Oxycodone 10 mg, Flexeril for pain control -Aspirin 325 mg daily x 30 days for DVT prophylaxis -Continue home dose 1000 units Vit D supplementation daily  Follow - up plan: 2 weeks after discharge for wound check, repeat x-rays, splint/suture removal   Contact information:  Truitt Merle MD, Thyra Breed PA-C. After hours and holidays please check [email or web address] for group call information for [facility]   Thompson Caul, PA-C [phone] (office) [email or web address]

## 2023-07-03 ENCOUNTER — Encounter (HOSPITAL_COMMUNITY): Payer: Self-pay | Admitting: Student

## 2023-07-03 DIAGNOSIS — S82852A Displaced trimalleolar fracture of left lower leg, initial encounter for closed fracture: Secondary | ICD-10-CM | POA: Diagnosis not present

## 2023-07-03 MED ORDER — HALOPERIDOL DECANOATE 100 MG/ML IM SOLN
50.0000 mg | Freq: Once | INTRAMUSCULAR | Status: AC
  Administered 2023-07-03: 50 mg via INTRAMUSCULAR
  Filled 2023-07-03: qty 0.5

## 2023-07-03 MED ORDER — POLYVINYL ALCOHOL 1.4 % OP SOLN
1.0000 [drp] | OPHTHALMIC | Status: DC | PRN
  Administered 2023-07-04 – 2023-07-17 (×5): 1 [drp] via OPHTHALMIC
  Filled 2023-07-03 (×2): qty 15

## 2023-07-03 NOTE — Progress Notes (Signed)
Physical Therapy Treatment Patient Details Name: Timothy [name] MRN: [id number] DOB: 1956-04-26 Today's Date: 07/03/2023   History of Present Illness 68 y/o male presents to [facility] ED on 2/6 for bilateral leg swelling/pain, redness and swelling noted to L>R. Imaging findings result in trimalleolar fx of the L ankle, mildly displaced distal fibular shaft fx, mild posterior subluxation of talar dome. S/p ORIF of L trimalleolar ankle fx and internal fixation of L syndesmosis 2/12. PMH includes history of schizophrenia, chronic neurogenic bladder with a history of self catheterizations, chronic fecal incontinence, history of chronic back pain, prostate cx w/ mets to spine.    PT Comments  Pt received in supine, pt agreeable to therapy session with encouragement and with good participation and improved tolerance for transfer and seated exercise instruction. Pt with significant hip external rotation and heavy emphasis on quad sets and hip Adduction/IR to work on improved resting posture. Pt may benefit from Prevalon boots given infrequent mobility and to promote neutral hip posture at rest, RN notified. Pt needing up to modA for lateral scoot OOB to drop arm chair and minA +2 safety for bed mobility and seated chair push-ups due to pt noncompliance with LLE NWB precs. Pt is able to verbalize precautions with increased time but needs constant prompting and physical assist to maintain while mobilizing. Chair alarm on for safety and pt has hoyer pad in chair for ease of return to bed if needed later in the day. Pt will continue to benefit from skilled rehab in a post acute setting < 3 hours per day to maximize functional gains before returning home.     If plan is discharge home, recommend the following: Two people to help with walking and/or transfers;Two people to help with bathing/dressing/bathroom;Direct supervision/assist for medications management;Help with stairs or ramp for entrance;Assist for  transportation;Assistance with cooking/housework;Direct supervision/assist for financial management;Supervision due to cognitive status   Can travel by private vehicle     No  Equipment Recommendations  Other (comment) (TBD, need to accurately determine what equipment he has at home -clarify with family?)    Recommendations for Other Services       Precautions / Restrictions Precautions Precautions: Fall Recall of Precautions/Restrictions: Impaired Precaution/Restrictions Comments: Not able to recall WB precautions, attempts to use L LE with transfers Required Braces or Orthoses: Splint/Cast Splint/Cast: Stirrup splint, Post (short leg) splint Splint/Cast - Date Prophylactic Dressing Applied (if applicable): 06/25/23 Restrictions Weight Bearing Restrictions Per Provider Order: Yes LLE Weight Bearing Per Provider Order: Non weight bearing     Mobility  Bed Mobility Overal bed mobility: Needs Assistance Bed Mobility: Supine to Sit     Supine to sit: Used rails, HOB elevated, Min assist, +2 for safety/equipment     General bed mobility comments: CGA to long sitting with use of both bed rails; pt able to transfer to sitting EOB on R side of bed with minA and extra time, use of bed rails, staff assist to keep LLE from pressing into the bed for leverage    Transfers Overall transfer level: Needs assistance Equipment used:  (bed pads) Transfers: Bed to chair/wheelchair/BSC            Lateral/Scoot Transfers: +2 physical assistance, +2 safety/equipment, Mod assist, From elevated surface General transfer comment: from EOB scooting toward drop arm recliner on his R side, initial practice with cues to squat at bedside holding onto back of locked recliner but pt states he is not able to do this, so instead had pt  practice EOB "chair push-ups" to lift hips off bed surface (therapist supporting LLE to prevent weight bearing), then transferred toward chair on his R side. minA for chair  push-ups only for LLE assist but up to modA for lateral scoots due to need for some bed pad assist and almost total LLE support as pt has difficulty holding LLE elevated while scooting. Pt non-compliant with LLE NWB precs with chair push-ups when LLE not supported by therapist. Mechanical lift pad in chair under chair pad for pt safety at end of session, RN aware of safe return transfer technique via +2 lateral scoot vs hoyer return to bed.    Ambulation/Gait               General Gait Details: Pt not yet able to stand on one leg.   Stairs             Wheelchair Mobility     Tilt Bed    Modified Rankin (Stroke Patients Only)       Balance Overall balance assessment: Needs assistance Sitting-balance support: Feet supported, Bilateral upper extremity supported, Single extremity supported Sitting balance-Leahy Scale: Good Sitting balance - Comments: LLE elevated off ground, pt with improved seated balance this date, no with static sitting and lifting hips off the bed using BUE       Standing balance comment: pt not yet able to stand on one leg                            Communication Communication Communication: No apparent difficulties  Cognition Arousal: Alert Behavior During Therapy: Flat affect   PT - Cognitive impairments: No family/caregiver present to determine baseline, Problem solving, Safety/Judgement, Memory, Sequencing                       PT - Cognition Comments: Pt more aware of LLE NWB precautions but has difficulty maintaining them during mobility tasks. Pt confidently reports he has hospital bed at home and thinks he has hoyer lift but is unclear about how long he has been non-ambulatory/had trouble getting OOB. Pt states "since I got here". Pt has some trouble attending to task and often making non-sequitirs. Pt states awareness that he has trouble switching attention from one task to another or with 2-step cues. Following  commands: Impaired Following commands impaired: Follows one step commands with increased time, Follows multi-step commands inconsistently    Cueing Cueing Techniques: Verbal cues, Tactile cues  Exercises General Exercises - Lower Extremity Ankle Circles/Pumps: AROM, Right, 10 reps, Supine Quad Sets: AROM, Both, 5 reps, Supine Long Arc Quad: AROM, Seated, Left, 5 reps Hip ABduction/ADduction: AROM, Both, 10 reps, Seated (x5 reps x2 sets) Other Exercises Other Exercises: chair push-ups x5 reps (from EOB and a couple from recliner) Other Exercises: lateral leans x2 with propping on elbow for core strengthening and linen adjustment    General Comments General comments (skin integrity, edema, etc.): Pt condom cath was not in place when therapists arrived to his room and pt's bed was wet with urine, pt gown changed and sheets removed from bed during session and RN notified pt will need new sheets; of note, per chart review pt has hx of neurogenic bladder and RN aware, she states she is attempting a bladder scan.      Pertinent Vitals/Pain Pain Assessment Pain Assessment: Faces Faces Pain Scale: Hurts a little bit Pain Location: pt does not localize other  than saying his bottom is sore Pain Descriptors / Indicators: Discomfort, Sore Pain Intervention(s): Monitored during session, Repositioned    Home Living                          Prior Function            PT Goals (current goals can now be found in the care plan section) Acute Rehab PT Goals Patient Stated Goal: to go home PT Goal Formulation: With patient Time For Goal Achievement: 07/11/23 Progress towards PT goals: Progressing toward goals    Frequency    Min 1X/week      PT Plan      Co-evaluation PT/OT/SLP Co-Evaluation/Treatment: Yes Reason for Co-Treatment: For patient/therapist safety;To address functional/ADL transfers PT goals addressed during session: Mobility/safety with  mobility;Strengthening/ROM        AM-PAC PT "6 Clicks" Mobility   Outcome Measure  Help needed turning from your back to your side while in a flat bed without using bedrails?: A Little Help needed moving from lying on your back to sitting on the side of a flat bed without using bedrails?: A Lot (+2 safety) Help needed moving to and from a bed to a chair (including a wheelchair)?: A Lot Help needed standing up from a chair using your arms (e.g., wheelchair or bedside chair)?: Total Help needed to walk in hospital room?: Total Help needed climbing 3-5 steps with a railing? : Total 6 Click Score: 10    End of Session Equipment Utilized During Treatment: Gait belt Activity Tolerance: Patient tolerated treatment well Patient left: in chair;with call bell/phone within reach;with chair alarm set;Other (comment) (lift pad under bed pad under his hips, BLE elevated) Nurse Communication: Mobility status;Need for lift equipment;Other (comment) (condom cath was not attached to him, pt has neurogenic bladder (RN aware)) PT Visit Diagnosis: Other abnormalities of gait and mobility (R26.89);History of falling (Z91.81);Muscle weakness (generalized) (M62.81)     Time: 1610-9604 PT Time Calculation (min) (ACUTE ONLY): 26 min  Charges:    $Therapeutic Exercise: 8-22 mins PT General Charges $$ ACUTE PT VISIT: 1 Visit                     Aritza Brunet P., PTA Acute Rehabilitation Services Secure Chat Preferred 9a-5:30pm Office: [phone]    Dorathy Kinsman [facility] 07/03/2023, 11:31 AM

## 2023-07-03 NOTE — Progress Notes (Signed)
PROGRESS NOTE  DICKEY CAAMANO  DOB: [birth year]  PCP: Wendi Maya, MD ZOX:[id number]  DOA: 06/25/2023  LOS: 6 days  Hospital Day: 9  Brief narrative: Timothy [name] is a 68 y.o. male with PMH significant for prostate cancer with vertebral mets, s/p back surgery, wheelchair-bound status, neurogenic bladder requiring self cath, chronic fecal incontinence, gonorrhea, schizophrenia, PTSD, behavioral disturbance who follows up at [facility] [place]. 2/6, patient presented to ED with complaint of bilateral leg swelling He states it has been present for months but became much worse yesterday redness and swelling noted left more than right. Patient was apparently seen in the ED on January 4 for a fall.  He underwent x-ray of left knee at that time which was unremarkable and was discharged.  It seems he did not have ankle x-ray done at that time.  Patient reports pain and swelling since that fall.  In the ED, patient was hemodynamically stable CBC/CMP mostly unremarkable Catheter urinalysis showed hazy yellow color urine with small hemoglobin, moderate leukocytes, positive nitrite, many bacteria UDS negative Chest x-ray negative for any acute infiltrates X-ray left ankle showed acute mildly displaced distal fibular shaft fracture, acute mildly displaced medial malleolar fracture, mild posterior subluxation of the talar dome with respect to the distal tibia. EDP discussed with orthopedics Dr. Hulda Humphrey. Admitted to [facility] Orthopedic consulted.  2/12, underwent ORIF of left ankle  Subjective:  Patient seen and examined.  Pain is controlled.  Denies any other complaints.  Is still unable to verify medications. Patient tells me that he only takes injection Haldol every month and is due for today and he does not take any other mental health medications.  Assessment and plan: Left ankle trimalleolar fracture S/p ORIF -2/12 Dr. Jena Gauss Secondary to a fall 5 weeks ago. Pain regimen: Tylenol as needed,  oxycodone as needed Bowel regimen: MiraLAX as needed DVT prophylaxis with Lovenox in the hospital.  Aspirin on discharge.  Hypokalemia Potassium improved with replacement. Recent Labs  Lab 06/28/23 0450 07/01/23 0610 07/02/23 0537  K 3.2* 3.3* 3.8   Abnormal urinalysis Neurogenic bladder H/o prostate cancer Abnormal urinalysis likely secondary to chronic self-catheterization  Currently no evidence of infection.  Not on antibiotics Flomax to continue   H/o back surgery Wheelchair-bound status   Fecal incontinence Since back surgery    Schizophrenia Home med list still not complete.  Not sure if he was actually taking Cogentin, Haldol, sertraline. Waiting to pharmacy to verify. Patient was able to verify that he takes haloperidol decanoate 50 mg injection every month and is due for today.  Will prescribe 1 dose today.   Mobility: Compromised mobility.  Now with further compromise.  Nonweightbearing left lower leg.  Refer to a SNF.  Goals of care   Code Status: Limited: Do not attempt resuscitation (DNR) -DNR-LIMITED -Do Not Intubate/DNI      DVT prophylaxis:  enoxaparin (LOVENOX) injection 40 mg Start: 07/02/23 0800 SCDs Start: 07/01/23 1324 SCDs Start: 06/25/23 2303   Antimicrobials: None Fluid: None Consultants: Orthopedics Family Communication: None at beside  Status: Inpatient Level of care:  Med-Surg   Patient is from: Home Needs to continue in-hospital care: Admitted postop. Anticipated d/c to: Skilled nursing facility when bed available.  Medically stable.   Diet:  Diet Order             Diet regular Room service appropriate? Yes; Fluid consistency: Thin  Diet effective now  Scheduled Meds:  acetaminophen  650 mg Oral Q6H   Or   acetaminophen  650 mg Rectal Q6H   cholecalciferol  1,000 Units Oral Daily   docusate sodium  100 mg Oral BID   enoxaparin (LOVENOX) injection  40 mg Subcutaneous Q24H   tamsulosin  0.4 mg  Oral Daily    PRN meds: alum & mag hydroxide-simeth, cyclobenzaprine, labetalol, melatonin, metoCLOPramide **OR** metoCLOPramide (REGLAN) injection, ondansetron **OR** ondansetron (ZOFRAN) IV, oxyCODONE, polyethylene glycol, polyvinyl alcohol   Infusions:     Antimicrobials: Anti-infectives (From admission, onward)    Start     Dose/Rate Route Frequency Ordered Stop   07/01/23 2300  vancomycin (VANCOCIN) IVPB 1000 mg/200 mL premix        1,000 mg 200 mL/hr over 60 Minutes Intravenous  Once 07/01/23 1432 07/02/23 0121   07/01/23 1415  vancomycin (VANCOCIN) IVPB 1000 mg/200 mL premix  Status:  Discontinued        1,000 mg 200 mL/hr over 60 Minutes Intravenous Every 12 hours 07/01/23 1323 07/01/23 1432   07/01/23 1132  vancomycin (VANCOCIN) powder  Status:  Discontinued          As needed 07/01/23 1153 07/01/23 1211   07/01/23 1045  ceFAZolin (ANCEF) IVPB 3g/150 mL premix  Status:  Discontinued        3 g 300 mL/hr over 30 Minutes Intravenous On call to O.R. 07/01/23 0946 07/01/23 1321   07/01/23 1017  clindamycin (CLEOCIN) 900 MG/50ML IVPB       Note to Pharmacy: Camillia Herter A: cabinet override      07/01/23 1017 07/01/23 1127   07/01/23 0851  ceFAZolin (ANCEF) 3-4 GM/150ML-% IVPB  Status:  Discontinued       Note to Pharmacy: Shanda Bumps M: cabinet override      07/01/23 0851 07/01/23 1037   07/01/23 0848  ceFAZolin (ANCEF) 2-4 GM/100ML-% IVPB  Status:  Discontinued       Note to Pharmacy: Shanda Bumps M: cabinet override      07/01/23 0848 07/01/23 0852   06/25/23 1630  cefTRIAXone (ROCEPHIN) 1 g in sodium chloride 0.9 % 100 mL IVPB        1 g 200 mL/hr over 30 Minutes Intravenous  Once 06/25/23 1621 06/25/23 1827       Objective: Vitals:   07/02/23 1944 07/03/23 0741  BP: 119/68 124/74  Pulse: 79 71  Resp: 18 17  Temp: 98.2 F (36.8 C) 98 F (36.7 C)  SpO2: 96% 100%    Intake/Output Summary (Last 24 hours) at 07/03/2023 1306 Last data filed at  07/02/2023 1746 Gross per 24 hour  Intake 240 ml  Output 570 ml  Net -330 ml   Filed Weights   06/25/23 1430 07/01/23 0936  Weight: 124 kg 123.8 kg   Weight change:  Body mass index is 35.05 kg/m.   Physical Exam: General exam: Fairly comfortable.  Pleasant. Skin: No rashes, lesions or ulcers. HEENT: Atraumatic, normocephalic, no obvious bleeding Lungs: Clear to auscultation bilaterally,  CVS: S1, S2, no murmur,   GI/Abd: Soft, nontender, nondistended, bowel sound present,   CNS: Alert, awake, slow to respond, answers with few words only Psychiatry: Mood appropriate Extremities: Bilateral chronic lower extremity lymphedema, no calf tenderness, left ankle on postoperative dressing, posterior splint present.  Data Review: I have personally reviewed the laboratory data and studies available.  F/u labs  Unresulted Labs (From admission, onward)     Start     Ordered   07/08/23  0500  Creatinine, serum  (enoxaparin (LOVENOX)    CrCl >/= 30 ml/min)  Weekly,   R     Comments: while on enoxaparin therapy   Question:  Specimen collection method  Answer:  Lab=Lab collect   07/01/23 1323            Total time spent in review of labs and imaging, patient evaluation, formulation of plan, documentation and communication with family: 35 minutes  Signed, Dorcas Carrow, MD [facility] 07/03/2023
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
## 2023-07-03 NOTE — Plan of Care (Signed)
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
## 2023-07-03 NOTE — Progress Notes (Addendum)
Physical Therapy Treatment Patient Details Name: Timothy [name] MRN: [id number] DOB: 04-Aug-1955 Today's Date: 07/03/2023   History of Present Illness 68 y/o male presents to [facility] ED on 2/6 for bilateral leg swelling/pain, redness and swelling noted to L>R. Imaging findings result in trimalleolar fx of the L ankle, mildly displaced distal fibular shaft fx, mild posterior subluxation of talar dome. S/p ORIF of L trimalleolar ankle fx and internal fixation of L syndesmosis 2/12. PMH includes history of schizophrenia, chronic neurogenic bladder with a history of self catheterizations, chronic fecal incontinence, history of chronic back pain, prostate cx w/ mets to spine.    PT Comments  Pt received in supine, alert and distracted, pt with x2 male family members in room (one states she is his sister). Pt on phone with his bank, but also calling other people during call with bank, and is asking bank to unlock his debit card, family members appear to be encouraging this call, bank member tells him that his card is not locked. Pt following some commands for BLE placement so PTA can assist to don Prevalon boots and sister instructed on purpose of boots for better supine positioning and pressure relief, pt instructed as well but does not appear to retain info due to being distracted. Care team notified of pt call given other notes on his chart (see LCSW note). Family also having difficulty participating in PTA questions regarding home set-up and situation as they were encouraging him to finish call with his bank. Police officer entering his room after session ended as PTA documenting, family members had already left. Pt continues to benefit from PT services to progress toward functional mobility goals.    If plan is discharge home, recommend the following: Two people to help with walking and/or transfers;Two people to help with bathing/dressing/bathroom;Direct supervision/assist for medications management;Help  with stairs or ramp for entrance;Assist for transportation;Assistance with cooking/housework;Direct supervision/assist for financial management;Supervision due to cognitive status   Can travel by private vehicle     No  Equipment Recommendations  Other (comment) (TBD, need to accurately determine what equipment he has at home -clarify with family?)    Recommendations for Other Services       Precautions / Restrictions Precautions Precautions: Fall Recall of Precautions/Restrictions: Impaired Precaution/Restrictions Comments: Not able to recall WB precautions, attempts to use L LE with transfers Required Braces or Orthoses: Splint/Cast Splint/Cast: Stirrup splint, Post (short leg) splint Splint/Cast - Date Prophylactic Dressing Applied (if applicable): 06/25/23 Restrictions Weight Bearing Restrictions Per Provider Order: Yes LLE Weight Bearing Per Provider Order: Non weight bearing     Mobility  Bed Mobility Overal bed mobility: Needs Assistance Bed Mobility: Supine to Sit     Supine to sit: Used rails, HOB elevated, Min assist, +2 for safety/equipment     General bed mobility comments: defer, pt poor attention and emphasis was on Prevalon boot instruction and pressure relief in supine-increased assist to roll in PM due to pt distraction.    Transfers Overall transfer level: Needs assistance Equipment used:  (bed pads) Transfers: Bed to chair/wheelchair/BSC            Lateral/Scoot Transfers: +2 physical assistance, +2 safety/equipment, Mod assist, From elevated surface General transfer comment: pt defers, wanting to rest, family in room    Ambulation/Gait               General Gait Details: Pt not yet able to stand on one leg.   Stairs  Wheelchair Mobility     Tilt Bed    Modified Rankin (Stroke Patients Only)       Balance Overall balance assessment: Needs assistance Sitting-balance support: Feet supported, Bilateral upper  extremity supported, Single extremity supported Sitting balance-Leahy Scale: Good Sitting balance - Comments: LLE elevated off ground, pt with improved seated balance this date, no with static sitting and lifting hips off the bed using BUE       Standing balance comment: pt not yet able to stand on one leg                            Communication Communication Communication: No apparent difficulties  Cognition Arousal: Alert Behavior During Therapy: Flat affect   PT - Cognitive impairments: Orientation, Awareness, Attention, Sequencing, Problem solving, Safety/Judgement, Difficult to assess Difficult to assess due to:  (pt distracted due to telephone call (pt phone on speaker with his banking institution) and family reports he is close to baseline, but see LCSW note)                     PT - Cognition Comments: Poor attention to task when PTA arrived to room, family present and pt attempting to call his bank, but while on hold with bank pt also calling other people and hanging up on them then switching back to bank call. Pt sister present in room and states he seems close to his cognitive baseline. Pt tells bank representative who answered the phone that he wants his debit card unlocked, bank rep states "your card isn't locked". pt family members leaving his room a few minutes after call ends. Pt following some commands with increased time but remains distracted throughout. Following commands: Impaired Following commands impaired: Follows one step commands with increased time, Follows multi-step commands inconsistently    Cueing Cueing Techniques: Verbal cues, Tactile cues  Exercises General Exercises - Lower Extremity Ankle Circles/Pumps: AROM, Right, 10 reps, Supine Quad Sets: AROM, Both, 5 reps, Supine Long Arc Quad: AROM, Seated, Left, 5 reps Hip ABduction/ADduction: AROM, Both, 10 reps, Seated (x5 reps x2 sets) Other Exercises Other Exercises: chair push-ups x5  reps (from EOB and a couple from recliner) Other Exercises: lateral leans x2 with propping on elbow for core strengthening and linen adjustment    General Comments        Pertinent Vitals/Pain Pain Assessment Pain Assessment: Faces Faces Pain Scale: Hurts a little bit Pain Location: pt does not localize pain Pain Descriptors / Indicators: Discomfort, Guarding Pain Intervention(s): Limited activity within patient's tolerance, Monitored during session, Repositioned (pain meds ~3 hours prior)    Home Living                          Prior Function            PT Goals (current goals can now be found in the care plan section) Acute Rehab PT Goals Patient Stated Goal: pt not able to state PT Goal Formulation: With patient Time For Goal Achievement: 07/11/23 Progress towards PT goals: Progressing toward goals    Frequency    Min 1X/week      PT Plan      Co-evaluation              AM-PAC PT "6 Clicks" Mobility   Outcome Measure  Help needed turning from your back to your side while in a flat bed without  using bedrails?: A Little Help needed moving from lying on your back to sitting on the side of a flat bed without using bedrails?: A Lot (+2 safety) Help needed moving to and from a bed to a chair (including a wheelchair)?: A Lot Help needed standing up from a chair using your arms (e.g., wheelchair or bedside chair)?: Total Help needed to walk in hospital room?: Total Help needed climbing 3-5 steps with a railing? : Total 6 Click Score: 10    End of Session   Activity Tolerance: Other (comment) (pt distracted and limited participation, but allowed PTA to place boots for pressure relief and repositioning in supine which was main focus) Patient left: in bed;with bed alarm set;with family/visitor present;Other (comment) (prevalon boots donned; bed alarm had not been on upon PTA arrival, but was activated at end of session for pt safety) Nurse  Communication: Mobility status;Other (comment);Precautions (family in room, pt calling his bank) PT Visit Diagnosis: Other abnormalities of gait and mobility (R26.89);History of falling (Z91.81);Muscle weakness (generalized) (M62.81)     Time: 0865-7846 PT Time Calculation (min) (ACUTE ONLY): 8 min  Charges:    $Therapeutic Activity: 8-22 mins PT General Charges $$ ACUTE PT VISIT: 1 Visit                     Roben Tatsch P., PTA Acute Rehabilitation Services Secure Chat Preferred 9a-5:30pm Office: [phone]    Dorathy Kinsman [facility] 07/03/2023, 5:16 PM

## 2023-07-03 NOTE — Progress Notes (Signed)
Orthopaedic Trauma Progress Note  SUBJECTIVE: Doing fairly well this morning.  Notes pain is well-controlled at rest.  Is able to get to bedside chair with therapies yesterday.  No chest pain. No SOB. No nausea/vomiting. No other complaints.  Tolerating diet and fluids.    OBJECTIVE:  Vitals:   07/02/23 1944 07/03/23 0741  BP: 119/68 124/74  Pulse: 79 71  Resp: 18 17  Temp: 98.2 F (36.8 C)   SpO2: 96% 100%    General: Sitting up in bed, no acute distress Respiratory: No increased work of breathing.  Left lower extremity: Well-padded, well-fitting splint in place.  Nontender above splint.  Endorses sensation to light touch over the toes.  Able to wiggle the toes a small amount.  Toes warm and well-perfused.  IMAGING: Stable post op imaging.   LABS:  No results found for this or any previous visit (from the past 24 hours).   ASSESSMENT: Timothy [name] is a 68 y.o. male, 2 Days Post-Op s/p OPEN REDUCTION INTERNAL FIXATION LEFT ANKLE FRACTURE  CV/Blood loss: Acute blood loss anemia, Hgb 11.8 on 07/02/2023.  Hemodynamically stable  PLAN: Weightbearing: NWB LLE ROM: Okay for knee motion.  Maintain splint to lower leg Incisional and dressing care: Dressings left intact until follow-up  Showering: Okay to shower, splint must stay clean and dry Orthopedic device(s): Splint LLE Pain management:  1. Tylenol 650 mg q 6 hours scheduled 2. Flexeril 10 mg BID PRN 3. Oxycodone 10-15 mg q 4 hours PRN 4. Dilaudid 1 mg q 3 hours PRN VTE prophylaxis: Lovenox, SCDs ID: Vancomycin post op completed Foley/Lines:  No foley, KVO IVFs Impediments to Fracture Healing: Vitamin D level 28, Continue on home dose supplementation Dispo: PT/OT evaluation ongoing, currently recommending SNF.  Okay for discharge from ortho standpoint once cleared by medicine team and therapies.  I have signed and placed discharge Rx for pain medication, DVT prophylaxis, muscle relaxer in patient's chart  D/C  recommendations: -Oxycodone 10 mg, Flexeril for pain control -Aspirin 325 mg daily x 30 days for DVT prophylaxis -Continue home dose 1000 units Vit D supplementation daily  Follow - up plan: 2 weeks after discharge for wound check, repeat x-rays, splint/suture removal   Contact information:  Truitt Merle MD, Thyra Breed PA-C. After hours and holidays please check [email or web address] for group call information for [facility]   Thompson Caul, PA-C [phone] (office) [email or web address]

## 2023-07-04 DIAGNOSIS — S82852A Displaced trimalleolar fracture of left lower leg, initial encounter for closed fracture: Secondary | ICD-10-CM | POA: Diagnosis not present

## 2023-07-04 MED ORDER — SERTRALINE HCL 100 MG PO TABS
100.0000 mg | ORAL_TABLET | Freq: Every day | ORAL | Status: DC
  Administered 2023-07-04 – 2023-07-22 (×17): 100 mg via ORAL
  Filled 2023-07-04 (×18): qty 1

## 2023-07-04 MED ORDER — GABAPENTIN 300 MG PO CAPS
300.0000 mg | ORAL_CAPSULE | Freq: Three times a day (TID) | ORAL | Status: DC
  Administered 2023-07-04 – 2023-07-22 (×48): 300 mg via ORAL
  Filled 2023-07-04 (×5): qty 1
  Filled 2023-07-04: qty 3
  Filled 2023-07-04 (×7): qty 1
  Filled 2023-07-04: qty 3
  Filled 2023-07-04 (×37): qty 1

## 2023-07-04 MED ORDER — ATORVASTATIN CALCIUM 80 MG PO TABS
80.0000 mg | ORAL_TABLET | Freq: Every day | ORAL | Status: DC
Start: 2023-07-04 — End: 2023-07-22
  Administered 2023-07-04 – 2023-07-22 (×17): 80 mg via ORAL
  Filled 2023-07-04 (×18): qty 1

## 2023-07-04 MED ORDER — HALOPERIDOL LACTATE 5 MG/ML IJ SOLN
2.0000 mg | Freq: Four times a day (QID) | INTRAMUSCULAR | Status: DC | PRN
  Administered 2023-07-04 – 2023-07-05 (×2): 2 mg via INTRAVENOUS
  Filled 2023-07-04 (×3): qty 1

## 2023-07-04 MED ORDER — HALOPERIDOL 5 MG PO TABS
5.0000 mg | ORAL_TABLET | Freq: Every day | ORAL | Status: DC
  Administered 2023-07-04: 5 mg via ORAL
  Filled 2023-07-04 (×2): qty 1

## 2023-07-04 MED ORDER — BENZTROPINE MESYLATE 0.5 MG PO TABS
0.5000 mg | ORAL_TABLET | Freq: Every day | ORAL | Status: DC
  Administered 2023-07-04 – 2023-07-22 (×17): 0.5 mg via ORAL
  Filled 2023-07-04 (×19): qty 1

## 2023-07-04 MED ORDER — HALOPERIDOL 5 MG PO TABS
5.0000 mg | ORAL_TABLET | Freq: Every day | ORAL | Status: DC
  Filled 2023-07-04: qty 1
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
## 2023-07-04 NOTE — Plan of Care (Signed)

## 2023-07-04 NOTE — Progress Notes (Signed)
Timothy [name]  DOB: 1956-01-04  PCP: Wendi Maya, MD WUJ:[id number]  DOA: 06/25/2023  LOS: 7 days  Hospital Day: 10  Brief narrative: MACARIUS RUARK is a 68 y.o. male with PMH significant for prostate cancer with vertebral mets, s/p back surgery, wheelchair-bound status, neurogenic bladder requiring self cath, chronic fecal incontinence, gonorrhea, schizophrenia, PTSD, behavioral disturbance who follows up at [facility] [place]. 2/6, patient presented to ED with complaint of bilateral leg swelling He states it has been present for months but became much worse yesterday redness and swelling noted left more than right. Patient was apparently seen in the ED on January 4 for a fall.  He underwent x-ray of left knee at that time which was unremarkable and was discharged.  It seems he did not have ankle x-ray done at that time.  Patient reports pain and swelling since that fall.  In the ED, patient was hemodynamically stable CBC/CMP mostly unremarkable Catheter urinalysis showed hazy yellow color urine with small hemoglobin, moderate leukocytes, positive nitrite, many bacteria UDS negative Chest x-ray negative for any acute infiltrates X-ray left ankle showed acute mildly displaced distal fibular shaft fracture, acute mildly displaced medial malleolar fracture, mild posterior subluxation of the talar dome with respect to the distal tibia. EDP discussed with orthopedics Dr. Hulda Humphrey. Admitted to [facility] Orthopedic consulted.  2/12, underwent ORIF of left ankle  Subjective:  Patient seen and examined.  Nursing reported him being paranoid.  Patient told me he is hungry and I told him to wait for the breakfast.  Apparently he dialed 911 to order breakfast.  He has more flat affect and difficult to understand today. He was given his long-acting haloperidol yesterday.  He was not able to verify his daily medications, however will resume Haldol, sertraline and Cogentin from his previous  medication administration history.  Assessment and plan: Left ankle trimalleolar fracture S/p ORIF -2/12 Dr. Jena Gauss Secondary to a fall 5 weeks ago. Pain regimen: Tylenol as needed, oxycodone as needed Bowel regimen: MiraLAX as needed DVT prophylaxis with Lovenox in the hospital.  Aspirin on discharge.  Hypokalemia Potassium improved with replacement. Recent Labs  Lab 06/28/23 0450 07/01/23 0610 07/02/23 0537  K 3.2* 3.3* 3.8   Abnormal urinalysis Neurogenic bladder H/o prostate cancer Abnormal urinalysis likely secondary to chronic self-catheterization  Currently no evidence of infection.  Not on antibiotics Flomax to continue   H/o back surgery Wheelchair-bound status   Schizophrenia Home med list still not complete.  Not sure if he was actually taking Cogentin, Haldol, sertraline.  This was prescribed before. Unable to verify from his [place]. Patient was able to verify that he takes haloperidol decanoate 50 mg injection every month and this was given 2/14.   Patient tells me that he has not been taking his daily medications, will resume haloperidol 5 mg at bedtime, sertraline and Cogentin from his previous records.  If continues to have symptoms, will consult psychiatry.   Mobility: Compromised mobility.  Now with further compromise.  Nonweightbearing left lower leg.  Refer to a SNF.  Goals of care   Code Status: Limited: Do not attempt resuscitation (DNR) -DNR-LIMITED -Do Not Intubate/DNI      DVT prophylaxis:  enoxaparin (LOVENOX) injection 40 mg Start: 07/02/23 0800 SCDs Start: 07/01/23 1324 SCDs Start: 06/25/23 2303   Antimicrobials: None Fluid: None Consultants: Orthopedics Family Communication: None at beside  Status: Inpatient Level of care:  Med-Surg   Patient is from: Home Needs to continue in-hospital care:  Unsafe discharge planning. Anticipated d/c to: Skilled nursing facility when bed available.     Diet:  Diet Order             Diet  regular Room service appropriate? Yes; Fluid consistency: Thin  Diet effective now                   Scheduled Meds:  acetaminophen  650 mg Oral Q6H   Or   acetaminophen  650 mg Rectal Q6H   atorvastatin  80 mg Oral Daily   benztropine  0.5 mg Oral Daily   cholecalciferol  1,000 Units Oral Daily   docusate sodium  100 mg Oral BID   enoxaparin (LOVENOX) injection  40 mg Subcutaneous Q24H   gabapentin  300 mg Oral TID   haloperidol  5 mg Oral QHS   sertraline  100 mg Oral Daily   tamsulosin  0.4 mg Oral Daily    PRN meds: alum & mag hydroxide-simeth, cyclobenzaprine, labetalol, melatonin, metoCLOPramide **OR** metoCLOPramide (REGLAN) injection, ondansetron **OR** ondansetron (ZOFRAN) IV, oxyCODONE, polyethylene glycol, polyvinyl alcohol   Infusions:     Antimicrobials: Anti-infectives (From admission, onward)    Start     Dose/Rate Route Frequency Ordered Stop   07/01/23 2300  vancomycin (VANCOCIN) IVPB 1000 mg/200 mL premix        1,000 mg 200 mL/hr over 60 Minutes Intravenous  Once 07/01/23 1432 07/02/23 0121   07/01/23 1415  vancomycin (VANCOCIN) IVPB 1000 mg/200 mL premix  Status:  Discontinued        1,000 mg 200 mL/hr over 60 Minutes Intravenous Every 12 hours 07/01/23 1323 07/01/23 1432   07/01/23 1132  vancomycin (VANCOCIN) powder  Status:  Discontinued          As needed 07/01/23 1153 07/01/23 1211   07/01/23 1045  ceFAZolin (ANCEF) IVPB 3g/150 mL premix  Status:  Discontinued        3 g 300 mL/hr over 30 Minutes Intravenous On call to O.R. 07/01/23 0946 07/01/23 1321   07/01/23 1017  clindamycin (CLEOCIN) 900 MG/50ML IVPB       Note to Pharmacy: Camillia Herter A: cabinet override      07/01/23 1017 07/01/23 1127   07/01/23 0851  ceFAZolin (ANCEF) 3-4 GM/150ML-% IVPB  Status:  Discontinued       Note to Pharmacy: Shanda Bumps M: cabinet override      07/01/23 0851 07/01/23 1037   07/01/23 0848  ceFAZolin (ANCEF) 2-4 GM/100ML-% IVPB  Status:   Discontinued       Note to Pharmacy: Shanda Bumps M: cabinet override      07/01/23 0848 07/01/23 0852   06/25/23 1630  cefTRIAXone (ROCEPHIN) 1 g in sodium chloride 0.9 % 100 mL IVPB        1 g 200 mL/hr over 30 Minutes Intravenous  Once 06/25/23 1621 06/25/23 1827       Objective: Vitals:   07/03/23 2000 07/04/23 0844  BP: 124/70 128/82  Pulse: 82 78  Resp:  17  Temp: 99 F (37.2 C) 98.6 F (37 C)  SpO2: 100% 100%    Intake/Output Summary (Last 24 hours) at 07/04/2023 1234 Last data filed at 07/03/2023 1740 Gross per 24 hour  Intake 480 ml  Output --  Net 480 ml   Filed Weights   06/25/23 1430 07/01/23 0936  Weight: 124 kg 123.8 kg   Weight change:  Body mass index is 35.05 kg/m.   Physical Exam: General: Today with flat  affect.  Not very keen to conversation. Cardiovascular: S1-S2 normal. Respiratory: Bilateral clear. Gastrointestinal: Soft nontender. Ext: Left leg on splint and immobilized. Neuro: Alert and awake.  Mostly oriented.  Flat affect.  Denies any delusions or hallucinations.   Data Review: I have personally reviewed the laboratory data and studies available.  F/u labs  Unresulted Labs (From admission, onward)     Start     Ordered   07/08/23 0500  Creatinine, serum  (enoxaparin (LOVENOX)    CrCl >/= 30 ml/min)  Weekly,   R     Comments: while on enoxaparin therapy   Question:  Specimen collection method  Answer:  Lab=Lab collect   07/01/23 1323            Total time spent in review of labs and imaging, patient evaluation, formulation of plan, documentation and communication with family: 35 minutes  Signed, Dorcas Carrow, MD [facility] 07/04/2023

## 2023-07-04 NOTE — Progress Notes (Signed)
Notified by unit secretary that patient called 911. When asked why he called 911, patient stated, "for breakfast". Reminded patient the he is in the hospital and to use call bell if he needs help.

## 2023-07-05 DIAGNOSIS — F200 Paranoid schizophrenia: Secondary | ICD-10-CM

## 2023-07-05 DIAGNOSIS — S82852A Displaced trimalleolar fracture of left lower leg, initial encounter for closed fracture: Secondary | ICD-10-CM | POA: Diagnosis not present

## 2023-07-05 MED ORDER — HALOPERIDOL 5 MG PO TABS
5.0000 mg | ORAL_TABLET | Freq: Two times a day (BID) | ORAL | Status: DC
  Filled 2023-07-05: qty 1

## 2023-07-05 MED ORDER — HALOPERIDOL 5 MG PO TABS
5.0000 mg | ORAL_TABLET | Freq: Every day | ORAL | Status: DC
Start: 2023-07-05 — End: 2023-07-06
  Administered 2023-07-05: 5 mg via ORAL
  Filled 2023-07-05 (×2): qty 1

## 2023-07-05 NOTE — Plan of Care (Signed)
  Problem: Clinical Measurements: Goal: Ability to maintain clinical measurements within normal limits will improve Outcome: Progressing Goal: Will remain free from infection Outcome: Progressing Goal: Diagnostic test results will improve Outcome: Progressing Goal: Respiratory complications will improve Outcome: Progressing Goal: Cardiovascular complication will be avoided Outcome: Progressing   Problem: Activity: Goal: Risk for activity intolerance will decrease Outcome: Progressing   Problem: Nutrition: Goal: Adequate nutrition will be maintained Outcome: Progressing   Problem: Elimination: Goal: Will not experience complications related to urinary retention Outcome: Progressing   Problem: Pain Managment: Goal: General experience of comfort will improve and/or be controlled Outcome: Progressing
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
## 2023-07-05 NOTE — Progress Notes (Signed)
PROGRESS NOTE  Timothy [name]  DOB: [birth year]  PCP: Wendi Maya, MD ZOX:[id number]  DOA: 06/25/2023  LOS: 8 days  Hospital Day: 11  Brief narrative: Timothy [name] is a 68 y.o. male with PMH significant for prostate cancer with vertebral mets, s/p back surgery, wheelchair-bound status, neurogenic bladder requiring self cath, chronic fecal incontinence, gonorrhea, schizophrenia, PTSD, behavioral disturbance who follows up at [facility] [place]. 2/6, patient presented to ED with complaint of bilateral leg swelling He states it has been present for months but became much worse yesterday redness and swelling noted left more than right. Patient was apparently seen in the ED on January 4 for a fall.  He underwent x-ray of left knee at that time which was unremarkable and was discharged.  It seems he did not have ankle x-ray done at that time.  Patient reports pain and swelling since that fall.  In the ED, patient was hemodynamically stable CBC/CMP mostly unremarkable Catheter urinalysis showed hazy yellow color urine with small hemoglobin, moderate leukocytes, positive nitrite, many bacteria UDS negative Chest x-ray negative for any acute infiltrates X-ray left ankle showed acute mildly displaced distal fibular shaft fracture, acute mildly displaced medial malleolar fracture, mild posterior subluxation of the talar dome with respect to the distal tibia. EDP discussed with orthopedics Dr. Hulda Humphrey. Admitted to [facility] Orthopedic consulted.  2/12, underwent ORIF of left ankle 2/14, started developing paranoia.  Apparently also APS involved on family.   Subjective:  Patient seen and examined.  His voice is clear today but is still with tangential thoughts and pressured speech.  Mostly alert awake and oriented.  He was given his Haldol last night and also given 1 dose of injectable Haldol.  He is eager to go home.  He keeps asking when he is going home.  He was wanting to call his sister.  Assessment  and plan: Left ankle trimalleolar fracture S/p ORIF -2/12 Dr. Jena Gauss Secondary to a fall 5 weeks ago. Pain regimen: Tylenol as needed, oxycodone as needed Bowel regimen: MiraLAX as needed DVT prophylaxis with Lovenox in the hospital.  Aspirin on discharge.  Hypokalemia Potassium improved with replacement. Recent Labs  Lab 07/01/23 0610 07/02/23 0537  K 3.3* 3.8   Abnormal urinalysis Neurogenic bladder H/o prostate cancer Abnormal urinalysis likely secondary to chronic self-catheterization  Currently no evidence of infection.  Not on antibiotics Flomax to continue   H/o back surgery Wheelchair-bound status   Schizophrenia Probably noncompliant to medications. Not sure if he was actually taking Cogentin, Haldol, sertraline.  This was prescribed before. Unable to verify from his [place]. Patient was able to verify that he takes haloperidol decanoate 50 mg injection every month and this was given 2/14.   Started back on Haldol 5 mg at night, Cogentin, Zoloft 100 mg.  He has Some symptom improvement today. Requested psychiatry consultation.   Mobility: Compromised mobility.  Now with further compromise.  Nonweightbearing left lower leg.  Refer to a SNF.  Medically stable to transfer to a SNF  Goals of care   Code Status: Limited: Do not attempt resuscitation (DNR) -DNR-LIMITED -Do Not Intubate/DNI      DVT prophylaxis:  enoxaparin (LOVENOX) injection 40 mg Start: 07/02/23 0800 SCDs Start: 07/01/23 1324 SCDs Start: 06/25/23 2303   Antimicrobials: None Fluid: None Consultants: Orthopedics Family Communication: None at beside  Status: Inpatient Level of care:  Med-Surg   Patient is from: Home Needs to continue in-hospital care: Unsafe discharge planning. Anticipated d/c to: Skilled nursing facility  when bed available.     Diet:  Diet Order             Diet regular Room service appropriate? Yes; Fluid consistency: Thin  Diet effective now                    Scheduled Meds:  acetaminophen  650 mg Oral Q6H   Or   acetaminophen  650 mg Rectal Q6H   atorvastatin  80 mg Oral Daily   benztropine  0.5 mg Oral Daily   cholecalciferol  1,000 Units Oral Daily   docusate sodium  100 mg Oral BID   enoxaparin (LOVENOX) injection  40 mg Subcutaneous Q24H   gabapentin  300 mg Oral TID   haloperidol  5 mg Oral QHS   sertraline  100 mg Oral Daily   tamsulosin  0.4 mg Oral Daily    PRN meds: alum & mag hydroxide-simeth, cyclobenzaprine, haloperidol lactate, labetalol, melatonin, metoCLOPramide **OR** metoCLOPramide (REGLAN) injection, ondansetron **OR** ondansetron (ZOFRAN) IV, oxyCODONE, polyethylene glycol, polyvinyl alcohol   Infusions:     Antimicrobials: Anti-infectives (From admission, onward)    Start     Dose/Rate Route Frequency Ordered Stop   07/01/23 2300  vancomycin (VANCOCIN) IVPB 1000 mg/200 mL premix        1,000 mg 200 mL/hr over 60 Minutes Intravenous  Once 07/01/23 1432 07/02/23 0121   07/01/23 1415  vancomycin (VANCOCIN) IVPB 1000 mg/200 mL premix  Status:  Discontinued        1,000 mg 200 mL/hr over 60 Minutes Intravenous Every 12 hours 07/01/23 1323 07/01/23 1432   07/01/23 1132  vancomycin (VANCOCIN) powder  Status:  Discontinued          As needed 07/01/23 1153 07/01/23 1211   07/01/23 1045  ceFAZolin (ANCEF) IVPB 3g/150 mL premix  Status:  Discontinued        3 g 300 mL/hr over 30 Minutes Intravenous On call to O.R. 07/01/23 0946 07/01/23 1321   07/01/23 1017  clindamycin (CLEOCIN) 900 MG/50ML IVPB       Note to Pharmacy: Camillia Herter A: cabinet override      07/01/23 1017 07/01/23 1127   07/01/23 0851  ceFAZolin (ANCEF) 3-4 GM/150ML-% IVPB  Status:  Discontinued       Note to Pharmacy: Shanda Bumps M: cabinet override      07/01/23 0851 07/01/23 1037   07/01/23 0848  ceFAZolin (ANCEF) 2-4 GM/100ML-% IVPB  Status:  Discontinued       Note to Pharmacy: Shanda Bumps M: cabinet override      07/01/23  0848 07/01/23 0852   06/25/23 1630  cefTRIAXone (ROCEPHIN) 1 g in sodium chloride 0.9 % 100 mL IVPB        1 g 200 mL/hr over 30 Minutes Intravenous  Once 06/25/23 1621 06/25/23 1827       Objective: Vitals:   07/05/23 0423 07/05/23 0822  BP: 107/66 116/63  Pulse: 68 91  Resp: 18 16  Temp: 97.8 F (36.6 C) 98.1 F (36.7 C)  SpO2: 95% 96%    Intake/Output Summary (Last 24 hours) at 07/05/2023 1248 Last data filed at 07/05/2023 0427 Gross per 24 hour  Intake 240 ml  Output 1025 ml  Net -785 ml   Filed Weights   06/25/23 1430 07/01/23 0936  Weight: 124 kg 123.8 kg   Weight change:  Body mass index is 35.05 kg/m.   Physical Exam: General: Alert awake and mostly oriented.  He is appropriately  asking questions but still he has no insight into his disease.  He has pressured speech but denies any delusions or hallucinations.  Denies any suicidal homicidal ideations. Cardiovascular: S1-S2 normal. Respiratory: Bilateral clear. Gastrointestinal: Soft nontender. Ext: Left leg on splint and immobilized. Neuro: Intact.  Unable to move legs due to chronic weakness and now with a splint.   Data Review: I have personally reviewed the laboratory data and studies available.  F/u labs  Unresulted Labs (From admission, onward)     Start     Ordered   07/08/23 0500  Creatinine, serum  (enoxaparin (LOVENOX)    CrCl >/= 30 ml/min)  Weekly,   R     Comments: while on enoxaparin therapy   Question:  Specimen collection method  Answer:  Lab=Lab collect   07/01/23 1323            Total time spent in review of labs and imaging, patient evaluation, formulation of plan, documentation and communication with family: 35 minutes  Signed, Dorcas Carrow, MD [facility] 07/05/2023

## 2023-07-06 DIAGNOSIS — S82852A Displaced trimalleolar fracture of left lower leg, initial encounter for closed fracture: Secondary | ICD-10-CM | POA: Diagnosis not present

## 2023-07-06 MED ORDER — HALOPERIDOL LACTATE 5 MG/ML IJ SOLN
2.0000 mg | Freq: Four times a day (QID) | INTRAMUSCULAR | Status: DC | PRN
  Administered 2023-07-09 – 2023-07-14 (×2): 2 mg via INTRAMUSCULAR
  Filled 2023-07-06 (×2): qty 1

## 2023-07-06 MED ORDER — HALOPERIDOL 5 MG PO TABS
5.0000 mg | ORAL_TABLET | Freq: Two times a day (BID) | ORAL | Status: DC | PRN
  Filled 2023-07-06: qty 1

## 2023-07-06 MED ORDER — HALOPERIDOL DECANOATE 100 MG/ML IM SOLN
50.0000 mg | Freq: Once | INTRAMUSCULAR | Status: AC
  Administered 2023-07-06: 50 mg via INTRAMUSCULAR
  Filled 2023-07-06: qty 0.5

## 2023-07-06 NOTE — Consult Note (Signed)
[facility] Psychiatric Consult Initial  Patient Name: .Timothy [name]  MRN: [id number]  DOB: [birth year]  Consult Order details:  Orders (From admission, onward)     Start     Ordered   07/04/23 1726  IP CONSULT TO PSYCHIATRY       Ordering Provider: Dorcas Carrow, MD  Provider:  (Not yet assigned)  Question Answer Comment  Location [facility]   Reason for Consult? Paranoid schizophrenia exacerbation      07/04/23 1726             Mode of Visit: In person    Psychiatry Consult Evaluation  Service Date: July 06, 2023 LOS:  LOS: 9 days  Chief Complaint "I feel a little depressed but I'm doing much better today."  Primary Psychiatric Diagnoses  1.Schizophrenia-by history 2.  PTSD-by history  Assessment  Timothy [name] is a 68 y.o. male admitted: Medicallyfor 06/25/2023  2:24 PM for bilateral leg swelling due to fall. He carries the psychiatric diagnoses of schizophrenia dn PTSD and has a past medical history of prostate cancer with vertebral mets, s/p back surgery, wheelchair-bound status, neurogenic bladder requiring self cath, chronic fecal incontinence, and gonorrhea.  His current presentation ofdisorganized speech, circumstantial and tangential thought process is most consistent with schizophrenia. Feel that his paranoia/delusions which are (as far as I can tell) only present at night are more c/w delirium from UTI.  He does not meet criteria for inpatient admission based on absent suicidal or homicidal ideation, intent or plan.  Current outpatient psychotropic medications include Haldol decanoate 100 mg q 4 weeks, Zoloft 100 mg daily and Cogentin 0.5 mg daily and historically he has had a moderate response to these medications. He was compliant with haldol dec per pharmacy record and not compliant with PO meds per pt report earlier this admission.  On initial examination, patient is awake, alert and oriented x 3 which has generally been true during the  day. He got half his dose of haldol dec 2/14 for unidentified reasons - giving 50 mg again 2/17, stopping PO haldol, and will note he is due on 3/17  2/17: Fixing haldol dec. Seems to be at or near baseline  Diagnoses:  Active Hospital problems: Principal Problem:   Trimalleolar fracture of ankle, closed, left, initial encounter Active Problems:   Self-catheterizes urinary bladder   Schizophrenia (HCC)   Neurogenic bladder   Essential (primary) hypertension   Fecal incontinence   DNR (do not resuscitate)/DNI(Do not intubate)    Plan  ## Psychiatric Medication Recommendations:  --STOP Haldol 5 mg at bedtime  for psychosis. Please monitor Qtc and check for EPS.  --Continue Zoloft 100 mg daily for depression --Increase Cogentin to 1 mg at bedtime for EPS. --Give repeat haldol dec 50 mg 2/17 next dose 100 mg due 3/17  -  -- Haldol 5 mg PO/2 mg IM for agitation --Continue medical treatment as the primary team.   ## Medical Decision Making Capacity: Not specifically addressed in this encounter  ## Further Work-up: None  -- most recent EKG on 2/13 had QtC of 433 - ordered repeat 2/18 after 2nd dose of decanoate  -- Pertinent labwork reviewed earlier this admission includes: Hb-11.8   ## Disposition:-- There are no psychiatric contraindications to discharge at this time  ## Behavioral / Environmental: -Delirium Precautions: Delirium Interventions for Nursing and Staff: - RN to open blinds every AM. - To Bedside: Glasses, hearing aide, and pt's own shoes. Make available to patients.  when possible and encourage use. - Encourage po fluids when appropriate, keep fluids within reach. - OOB to chair with meals. - Passive ROM exercises to all extremities with AM & PM care. - RN to assess orientation to person, time and place QAM and PRN. - Recommend extended visitation hours with familiar family/friends as feasible. - Staff to minimize disturbances at night. Turn off television when pt asleep or  when not in use.    ## Safety and Observation Level:  - Based on my clinical evaluation, I estimate the patient to be at low risk of self harm in the current setting. - At this time, we recommend  routine observation. This decision is based on my review of the chart including patient's history and current presentation, interview of the patient, mental status examination, and consideration of suicide risk including evaluating suicidal ideation, plan, intent, suicidal or self-harm behaviors, risk factors, and protective factors. This judgment is based on our ability to directly address suicide risk, implement suicide prevention strategies, and develop a safety plan while the patient is in the clinical setting. Please contact our team if there is a concern that risk level has changed.  CSSR Risk Category:C-SSRS RISK CATEGORY: No Risk  Suicide Risk Assessment: Patient has following modifiable risk factors for suicide: under treated depression  and medication noncompliance, which we are addressing by prescribing medications.. Patient has following non-modifiable or demographic risk factors for suicide: male gender Patient has the following protective factors against suicide: Frustration tolerance and no history of suicide attempts  Thank you for this consult request. Recommendations have been communicated to the primary team.  We will likely see x1 2/19 to assess EPS and sign off afterwards.\  [name]       History of Present Illness  Relevant Aspects of Hospital Hospital Course:  Admitted on 06/25/2023 for bilateral leg swelling due to fall. Has generally done well during the day with episodes of apranoia/agitation at night on chart review.    Patient Report:  Patient seen face to face in his hospital room. He is awake, alert and oriented to place, person and situation. He is calm and cooperative during evaluation. States his mood today is "excellent" because "the young and the  restless" is on and he is "young and restless". Makes several references to classic TV shows through interview. Asks if he is going to get repeat IM haldol dec to get it "in his butt, because that's where I need it". Today I noted some ongoing disorganization and some easily redirectable bizarre thoughts but no overt delusions/paranoia - for example read my name badge and asked me if I was "Lahoma Rocker" but accepted no w/o challenging.  Looking through EMR psychotic sx notably worse at night.   He denied SI, HI, AH/Vh.   Psych ROS:  Depression: Reports depressed mood and low energy level.  Anxiety: denies anxiety symptoms  Mania (lifetime and current): denies  Psychosis: (lifetime and current): patient is paranoid with disorganized speech, circumstantial and tangential thought process.   Collateral information:  Declined to give any collateral information/contact    ROS  Did not endorse somatic complaints to me.   Psychiatric and Social History  Psychiatric History:  Information collected from the patient  Prev Dx/Sx: PTSD/Pranoid schizophrenia [facility] Meds (current): Zoloft 100 mg daily, Haldol dec 100 mg q 4 weeks and Cogentin 0.5 mg at bedtime.  Previous Med Trials: unknown  Therapy: denies   Prior Psych Hospitalization:denies  Prior Self Harm: denies Prior Violence: denies  Family Psych History: denies  Family Hx suicide: denies   Social History:  Educational Hx: high school graduate Occupational Hx: Retired Engineer, agricultural. Patient states he was in [employer] for 4 years.  Legal Hx: denied  Living Situation: lives alone in a single family home Spiritual Hx: unknown Access to weapons/lethal means: denied    Substance History Alcohol: social drinker  Type of alcohol beer Last Drink weeks ago Number of drinks per day 203 bottles per week History of alcohol withdrawal seizures denied  History of DT's denied  Tobacco:  smokes Black and Milds which helps to calm his nerve and improve his attention.  Illicit drugs: denies  Prescription drug abuse: denies  Rehab hx: denies   Exam Findings  Physical Exam:   General: constricted affect.   Cardiovascular: S1-S2 normal. Respiratory: Bilateral clear. Gastrointestinal: Soft nontender. Ext: Left leg on splint and immobilized. Neuro: Alert and awake.  Mostly oriented. Psychiatry- Denies any delusions or hallucinations.    Vital Signs:  Temp:  [97.7 F (36.5 C)-98.3 F (36.8 C)] 97.7 F (36.5 C) (02/17 0829) Pulse Rate:  [73-89] 89 (02/17 0829) Resp:  [17-18] 17 (02/17 0829) BP: (109-132)/(67-73) 127/68 (02/17 0829) SpO2:  [94 %-98 %] 96 % (02/17 0829) Blood pressure 127/68, pulse 89, temperature 97.7 F (36.5 C), temperature source Oral, resp. rate 17, height 6\' 2"  (1.88 m), weight 123.8 kg, SpO2 96%. Body mass index is 35.05 kg/m.  Physical Exam Musculoskeletal:     Comments: No termor, rigidity, cogwheeling b/l u/e. Full ROM at elbow.      Mental Status Exam: General Appearance: Casual  Orientation:  Full (Time, Place, and Person)  Memory:  Recent;   Fair  Concentration:  Concentration: Good  Recall:  Fair  Attention  Fair  Eye Contact:  Fair  Speech:   somewhat disorganised  Language:  Good  Volume:  Decreased  Mood: "excellent"  Affect:  Appropriate and Full Range  Thought Process:  Disorganized  Thought Content:  Tangential  Suicidal Thoughts:  No  Homicidal Thoughts:  No  Judgement:  Fair  Insight:  Fair  Psychomotor Activity:  Decreased  Akathisia:  No  Fund of Knowledge:  Fair      Assets:  Communication Skills  Cognition:  WNL  ADL's:  Impaired  AIMS (if indicated):        Other History   These have been pulled in through the EMR, reviewed, and updated if appropriate.  Family History:  The patient's Family history is unknown by patient.  Medical History: Past Medical History:  Diagnosis Date   Behavioral  disorder    Cancer [facility])    prostate cancer & cancer in his spine   Chronic back pain    Constipation    Decreased mobility    uses wheelchair, spinal cancer   Gonorrhea    Impaired mobility    prostate cancer cancer in his spine led to surgery and some decrease use of his lower extremities, neurogenic bladder   Intermittent self-catheterization of bladder    Neurogenic bladder    Paranoid schizophrenia (HCC)    PTSD (post-traumatic stress disorder)     Surgical History: Past Surgical History:  Procedure Laterality Date   BACK SURGERY     ORIF ANKLE FRACTURE Left 07/01/2023   Procedure: OPEN REDUCTION INTERNAL FIXATION (ORIF) ANKLE FRACTURE;  Surgeon: Roby Lofts, MD;  Location: [facility] OR;  Service: Orthopedics;  Laterality: Left;  Medications:   Current Facility-Administered Medications:    acetaminophen (TYLENOL) tablet 650 mg, 650 mg, Oral, Q6H, 650 mg at 07/06/23 0815 **OR** acetaminophen (TYLENOL) suppository 650 mg, 650 mg, Rectal, Q6H, McClung, Sarah A, PA-C   alum & mag hydroxide-simeth (MAALOX/MYLANTA) 200-200-20 MG/5ML suspension 30 mL, 30 mL, Oral, Q4H PRN, McClung, Sarah A, PA-C   atorvastatin (LIPITOR) tablet 80 mg, 80 mg, Oral, Daily, Dorcas Carrow, MD, 80 mg at 07/06/23 1610   benztropine (COGENTIN) tablet 0.5 mg, 0.5 mg, Oral, Daily, Ghimire, Lyndel Safe, MD, 0.5 mg at 07/06/23 0818   cholecalciferol (VITAMIN D3) 25 MCG (1000 UNIT) tablet 1,000 Units, 1,000 Units, Oral, Daily, [place] Bali, PA-C, 1,000 Units at 07/06/23 0815   cyclobenzaprine (FLEXERIL) tablet 10 mg, 10 mg, Oral, BID PRN, [place], PA-C, 10 mg at 07/06/23 9604   docusate sodium (COLACE) capsule 100 mg, 100 mg, Oral, BID, [place], PA-C, 100 mg at 07/06/23 0814   enoxaparin (LOVENOX) injection 40 mg, 40 mg, Subcutaneous, Q24H, McClung, Sarah A, PA-C, 40 mg at 07/06/23 0815   gabapentin (NEURONTIN) capsule 300 mg, 300 mg, Oral, TID, Dorcas Carrow, MD, 300 mg at 07/06/23 0815    haloperidol (HALDOL) tablet 5 mg, 5 mg, Oral, BID PRN **OR** haloperidol lactate (HALDOL) injection 2 mg, 2 mg, Intramuscular, Q6H PRN, Nixie Laube A   haloperidol decanoate (HALDOL DECANOATE) 100 MG/ML injection 50 mg, 50 mg, Intramuscular, Once, Emanuell Morina A   labetalol (NORMODYNE) injection 10 mg, 10 mg, Intravenous, Q4H PRN, McClung, Sarah A, PA-C   melatonin tablet 10 mg, 10 mg, Oral, QHS PRN, [place], PA-C, 10 mg at 07/05/23 2110   metoCLOPramide (REGLAN) tablet 5-10 mg, 5-10 mg, Oral, Q8H PRN **OR** metoCLOPramide (REGLAN) injection 5-10 mg, 5-10 mg, Intravenous, Q8H PRN, McClung, Sarah A, PA-C   ondansetron (ZOFRAN) tablet 4 mg, 4 mg, Oral, Q6H PRN **OR** ondansetron (ZOFRAN) injection 4 mg, 4 mg, Intravenous, Q6H PRN, McClung, Sarah A, PA-C   oxyCODONE (Oxy IR/ROXICODONE) immediate release tablet 10-15 mg, 10-15 mg, Oral, Q4H PRN, [place], PA-C, 15 mg at 07/05/23 2110   polyethylene glycol (MIRALAX / GLYCOLAX) packet 17 g, 17 g, Oral, Daily PRN, Thyra Breed A, PA-C, 17 g at 07/05/23 0700   polyvinyl alcohol (LIQUIFILM TEARS) 1.4 % ophthalmic solution 1 drop, 1 drop, Both Eyes, Q1H PRN, Dorcas Carrow, MD, 1 drop at 07/04/23 0947   sertraline (ZOLOFT) tablet 100 mg, 100 mg, Oral, Daily, Ghimire, Kuber, MD, 100 mg at 07/06/23 0814   tamsulosin (FLOMAX) capsule 0.4 mg, 0.4 mg, Oral, Daily, Thyra Breed A, PA-C, 0.4 mg at 07/06/23 5409  Allergies: Allergies  Allergen Reactions   Penicillins Anaphylaxis    Has patient had a PCN reaction causing immediate rash, facial/tongue/throat swelling, SOB or lightheadedness with hypotension: Yes Has patient had a PCN reaction causing severe rash involving mucus membranes or skin necrosis: No Has patient had a PCN reaction that required hospitalization: Yes Has patient had a PCN reaction occurring within the last 10 years: No If all of the above answers are "NO", then may proceed with Cephalosporin use.     Tamsulosin Other (See Comments)    Weakness, fatigue    Claris Che A Alyaan Budzynski

## 2023-07-06 NOTE — Progress Notes (Signed)
Physical Therapy Treatment Patient Details Name: Timothy [name] MRN: [id number] DOB: July 09, 1955 Today's Date: 07/06/2023   History of Present Illness 68 y/o male presents to [facility] ED on 2/6 for bilateral leg swelling/pain, redness and swelling noted to L>R. Imaging findings result in trimalleolar fx of the L ankle, mildly displaced distal fibular shaft fx, mild posterior subluxation of talar dome. S/p ORIF of L trimalleolar ankle fx and internal fixation of L syndesmosis 2/12. PMH includes history of schizophrenia, chronic neurogenic bladder with a history of self catheterizations, chronic fecal incontinence, history of chronic back pain, prostate cx w/ mets to spine.    PT Comments  Pt received in supine, agreeable to therapy session with encouragement, with emphasis on OOB transfer to chair via lateral scoot technique and supine/seated BLE exercises for ROM/strengthening. Pt continues to require +2 assist for safety and to prevent accidental LLE weightbearing while scooting, but he is unable to stand from elevated bed with +2 assist due to inability to maintain LLE off ground while attempting transfer. Pt needing less lift assist for bed mobility and transfers this date and with better attention to task and command following this session. PTA discussed safe technique with RN for return transfer via scooting from drop arm chair vs hoyer to return to bed. Pt will continue to benefit from skilled rehab in a post acute setting < 3 hours per day to maximize functional gains before returning home.     If plan is discharge home, recommend the following: Two people to help with walking and/or transfers;Two people to help with bathing/dressing/bathroom;Direct supervision/assist for medications management;Help with stairs or ramp for entrance;Assist for transportation;Assistance with cooking/housework;Direct supervision/assist for financial management;Supervision due to cognitive status   Can travel by private  vehicle     No  Equipment Recommendations  Other (comment) (TBD, need to accurately determine what equipment he has at home -clarify with family?)    Recommendations for Other Services       Precautions / Restrictions Precautions Precautions: Fall Recall of Precautions/Restrictions: Impaired Precaution/Restrictions Comments: Pt able to verbalize WB precautions, but attempts to use L LE with transfers Required Braces or Orthoses: Splint/Cast Splint/Cast: Stirrup splint, Post (short leg) splint Splint/Cast - Date Prophylactic Dressing Applied (if applicable): 06/25/23 Restrictions Weight Bearing Restrictions Per Provider Order: Yes LLE Weight Bearing Per Provider Order: Non weight bearing     Mobility  Bed Mobility Overal bed mobility: Needs Assistance Bed Mobility: Supine to Sit     Supine to sit: Min assist, HOB elevated, Used rails     General bed mobility comments: to R EOB, increased time to perform LLE guarding to prevent accidental weight bearing, good cues of BUE to advance his hips to EOB.    Transfers Overall transfer level: Needs assistance Equipment used: Ambulation equipment used (bed pads) Transfers: Sit to/from Stand, Bed to chair/wheelchair/BSC Sit to Stand: Mod assist, +2 physical assistance, From elevated surface          Lateral/Scoot Transfers: +2 physical assistance, +2 safety/equipment, From elevated surface, Min assist General transfer comment: partial stand from elevated bed>Stedy after pt adamant to attempt, pt has prevalon boot on LLE for protection of limb, however pt unable to maintain LLE NWB during attempt to stand and only raising up ~75% of the way before trying to push through LLE so pt sits back down on bed with encouragement from PTA then pt agreeable to scoot OOB to chair with discussion on sequencing. +2 for safety with LLE elevated by PTA  and RN assisting with bed pad under his hips PRN, scooting toward his R side.     Ambulation/Gait               General Gait Details: Pt not yet able to stand on one leg, can lift hips off bed while NWB on LLE but can't fully stand without WB on both legs   Stairs             Wheelchair Mobility     Tilt Bed    Modified Rankin (Stroke Patients Only)       Balance Overall balance assessment: Needs assistance Sitting-balance support: Feet supported, Bilateral upper extremity supported, Single extremity supported Sitting balance-Leahy Scale: Good Sitting balance - Comments: LLE elevated off ground, pt with improved seated balance this date, no with static sitting and lifting hips off the bed using BUE       Standing balance comment: pt not yet able to stand on one leg                            Communication Communication Communication: No apparent difficulties  Cognition Arousal: Alert Behavior During Therapy: Flat affect   PT - Cognitive impairments: Orientation, Awareness, Attention, Sequencing, Problem solving, Safety/Judgement, Difficult to assess                       PT - Cognition Comments: Pt more able to attend to task this date, but easily distracted and somewhat tangential. Pt needs hands-on assist to prevent LLE accidental WB due to impulsivity/cognitive deficit. Following commands: Impaired Following commands impaired: Follows one step commands with increased time, Follows multi-step commands inconsistently    Cueing Cueing Techniques: Verbal cues, Tactile cues  Exercises General Exercises - Lower Extremity Long Arc Quad: AROM, Seated, 10 reps, Both Heel Slides: AROM, Left, 5 reps, Supine Hip ABduction/ADduction: AAROM, 10 reps, Supine Other Exercises Other Exercises: chair push-ups x5 reps (from EOB and a couple from recliner)    General Comments General comments (skin integrity, edema, etc.): Prevalon boots adjusted on his legs to prevent excessive hip ER and pt agreeable      Pertinent  Vitals/Pain Pain Assessment Pain Assessment: Faces Faces Pain Scale: Hurts little more Pain Location: pt does not localize pain but asks for pain meds during session Pain Descriptors / Indicators: Discomfort, Grimacing Pain Intervention(s): Limited activity within patient's tolerance, Monitored during session, Repositioned, Patient requesting pain meds-RN notified    Home Living                          Prior Function            PT Goals (current goals can now be found in the care plan section) Acute Rehab PT Goals Patient Stated Goal: Pt not able to state PT Goal Formulation: With patient Time For Goal Achievement: 07/11/23 Progress towards PT goals: Progressing toward goals    Frequency    Min 1X/week      PT Plan      Co-evaluation              AM-PAC PT "6 Clicks" Mobility   Outcome Measure  Help needed turning from your back to your side while in a flat bed without using bedrails?: A Lot Help needed moving from lying on your back to sitting on the side of a flat bed without using bedrails?: A Lot  Help needed moving to and from a bed to a chair (including a wheelchair)?: A Lot Help needed standing up from a chair using your arms (e.g., wheelchair or bedside chair)?: Total Help needed to walk in hospital room?: Total Help needed climbing 3-5 steps with a railing? : Total 6 Click Score: 9    End of Session Equipment Utilized During Treatment: Gait belt Activity Tolerance: Other (comment) (Pt distracted and limited participation, but allowed PTA to place boots for pressure relief and repositioning in supine which was main focus) Patient left: Other (comment);in chair;with chair alarm set;with call bell/phone within reach (Prevalon boots donned; chair alarm activated) Nurse Communication: Mobility status;Other (comment);Precautions;Patient requests pain meds (+2 assist for safety when scooting back to bed or can use hoyer lift.) PT Visit Diagnosis:  Other abnormalities of gait and mobility (R26.89);History of falling (Z91.81);Muscle weakness (generalized) (M62.81)     Time: 1720-1741 PT Time Calculation (min) (ACUTE ONLY): 21 min  Charges:    $Therapeutic Exercise: 8-22 mins PT General Charges $$ ACUTE PT VISIT: 1 Visit                     Webb Weed P., PTA Acute Rehabilitation Services Secure Chat Preferred 9a-5:30pm Office: [phone]    Dorathy Kinsman [facility] 07/06/2023, 5:59 PM

## 2023-07-06 NOTE — Progress Notes (Signed)
PROGRESS NOTE  Timothy [name]  DOB: 1956-04-18  PCP: Wendi Maya, MD VWU:[id number]  DOA: 06/25/2023  LOS: 9 days  Hospital Day: 12  Brief narrative: Timothy [name] is a 68 y.o. male with PMH significant for prostate cancer with vertebral mets, s/p back surgery, wheelchair-bound status, neurogenic bladder requiring self cath, chronic fecal incontinence, gonorrhea, schizophrenia, PTSD, behavioral disturbance who follows up at [facility] [place]. 2/6, patient presented to ED with complaint of bilateral leg swelling He states it has been present for months but became much worse yesterday redness and swelling noted left more than right. Patient was apparently seen in the ED on January 4 for a fall.  He underwent x-ray of left knee at that time which was unremarkable and was discharged.  It seems he did not have ankle x-ray done at that time.  Patient reports pain and swelling since that fall.  In the ED, patient was hemodynamically stable CBC/CMP mostly unremarkable Catheter urinalysis showed hazy yellow color urine with small hemoglobin, moderate leukocytes, positive nitrite, many bacteria UDS negative Chest x-ray negative for any acute infiltrates X-ray left ankle showed acute mildly displaced distal fibular shaft fracture, acute mildly displaced medial malleolar fracture, mild posterior subluxation of the talar dome with respect to the distal tibia. EDP discussed with orthopedics Dr. Hulda Humphrey. Admitted to [facility] Orthopedic consulted.  2/12, underwent ORIF of left ankle 2/14, started developing paranoia.  Apparently also APS involved on family.   Subjective:  Patient seen and examined.  Remains quiet and composed, however with tangential thoughts.  He does not have any insight into his medical issues today.  Assessment and plan: Left ankle trimalleolar fracture S/p ORIF -2/12 Dr. Jena Gauss Secondary to a fall 5 weeks ago. Pain regimen: Tylenol as needed, oxycodone as needed Bowel regimen:  MiraLAX as needed DVT prophylaxis with Lovenox in the hospital.  Aspirin on discharge.  Hypokalemia Potassium improved with replacement.  Will recheck electrolytes today. Recent Labs  Lab 07/01/23 0610 07/02/23 0537  K 3.3* 3.8   Abnormal urinalysis Neurogenic bladder H/o prostate cancer Abnormal urinalysis likely secondary to chronic self-catheterization  Currently no evidence of infection.  Not on antibiotics Flomax to continue   H/o back surgery Wheelchair-bound status   Schizophrenia Probably noncompliant to medications. Not sure if he was actually taking Cogentin, Haldol, sertraline.  This was prescribed before. Patient was able to verify that he takes haloperidol decanoate 50 mg injection every month and this was given 2/14.   Started back on Haldol 5 mg at night, Cogentin, Zoloft 100 mg.  He has Some symptom improvement however remains with persistent tangential thoughts and distractibility.  Seen by psychiatry.  Anticipating psych follow-up in the hospital.   Mobility: Compromised mobility.  Now with further compromise.  Nonweightbearing left lower leg.  Refer to a SNF.  Medically stable if cleared by psych.  Goals of care   Code Status: Limited: Do not attempt resuscitation (DNR) -DNR-LIMITED -Do Not Intubate/DNI      DVT prophylaxis:  enoxaparin (LOVENOX) injection 40 mg Start: 07/02/23 0800 SCDs Start: 07/01/23 1324 SCDs Start: 06/25/23 2303   Antimicrobials: None Fluid: None Consultants: Orthopedics, psychiatry Family Communication: None at beside  Status: Inpatient Level of care:  Med-Surg   Patient is from: Home Needs to continue in-hospital care: Unsafe discharge planning. Anticipated d/c to: Skilled nursing facility when bed available.     Diet:  Diet Order             Diet regular Room  service appropriate? Yes; Fluid consistency: Thin  Diet effective now                   Scheduled Meds:  acetaminophen  650 mg Oral Q6H   Or    acetaminophen  650 mg Rectal Q6H   atorvastatin  80 mg Oral Daily   benztropine  0.5 mg Oral Daily   cholecalciferol  1,000 Units Oral Daily   docusate sodium  100 mg Oral BID   enoxaparin (LOVENOX) injection  40 mg Subcutaneous Q24H   gabapentin  300 mg Oral TID   haloperidol  5 mg Oral QHS   sertraline  100 mg Oral Daily   tamsulosin  0.4 mg Oral Daily    PRN meds: alum & mag hydroxide-simeth, cyclobenzaprine, haloperidol lactate, labetalol, melatonin, metoCLOPramide **OR** metoCLOPramide (REGLAN) injection, ondansetron **OR** ondansetron (ZOFRAN) IV, oxyCODONE, polyethylene glycol, polyvinyl alcohol   Infusions:     Antimicrobials: Anti-infectives (From admission, onward)    Start     Dose/Rate Route Frequency Ordered Stop   07/01/23 2300  vancomycin (VANCOCIN) IVPB 1000 mg/200 mL premix        1,000 mg 200 mL/hr over 60 Minutes Intravenous  Once 07/01/23 1432 07/02/23 0121   07/01/23 1415  vancomycin (VANCOCIN) IVPB 1000 mg/200 mL premix  Status:  Discontinued        1,000 mg 200 mL/hr over 60 Minutes Intravenous Every 12 hours 07/01/23 1323 07/01/23 1432   07/01/23 1132  vancomycin (VANCOCIN) powder  Status:  Discontinued          As needed 07/01/23 1153 07/01/23 1211   07/01/23 1045  ceFAZolin (ANCEF) IVPB 3g/150 mL premix  Status:  Discontinued        3 g 300 mL/hr over 30 Minutes Intravenous On call to O.R. 07/01/23 0946 07/01/23 1321   07/01/23 1017  clindamycin (CLEOCIN) 900 MG/50ML IVPB       Note to Pharmacy: Camillia Herter A: cabinet override      07/01/23 1017 07/01/23 1127   07/01/23 0851  ceFAZolin (ANCEF) 3-4 GM/150ML-% IVPB  Status:  Discontinued       Note to Pharmacy: Shanda Bumps M: cabinet override      07/01/23 0851 07/01/23 1037   07/01/23 0848  ceFAZolin (ANCEF) 2-4 GM/100ML-% IVPB  Status:  Discontinued       Note to Pharmacy: Shanda Bumps M: cabinet override      07/01/23 0848 07/01/23 0852   06/25/23 1630  cefTRIAXone (ROCEPHIN) 1 g in  sodium chloride 0.9 % 100 mL IVPB        1 g 200 mL/hr over 30 Minutes Intravenous  Once 06/25/23 1621 06/25/23 1827       Objective: Vitals:   07/06/23 0452 07/06/23 0829  BP: 109/71 127/68  Pulse: 73 89  Resp: 17 17  Temp: 98.3 F (36.8 C) 97.7 F (36.5 C)  SpO2: 94% 96%    Intake/Output Summary (Last 24 hours) at 07/06/2023 1119 Last data filed at 07/06/2023 0322 Gross per 24 hour  Intake --  Output 3300 ml  Net -3300 ml   Filed Weights   06/25/23 1430 07/01/23 0936  Weight: 124 kg 123.8 kg   Weight change:  Body mass index is 35.05 kg/m.   Physical Exam: General: Alert awake.  Mostly oriented to place and situation.  Pressured speech.  Tangential thoughts. Cardiovascular: S1-S2 normal. Respiratory: Bilateral clear. Gastrointestinal: Soft nontender. Ext: Left leg on splint and immobilized. Neuro: Intact.  Unable to  move legs due to chronic weakness and now with a splint.   Data Review: I have personally reviewed the laboratory data and studies available.  F/u labs  Unresulted Labs (From admission, onward)     Start     Ordered   07/08/23 0500  Creatinine, serum  (enoxaparin (LOVENOX)    CrCl >/= 30 ml/min)  Weekly,   R     Comments: while on enoxaparin therapy   Question:  Specimen collection method  Answer:  Lab=Lab collect   07/01/23 1323            Total time spent in review of labs and imaging, patient evaluation, formulation of plan, documentation and communication with family: 35 minutes  Signed, Dorcas Carrow, MD [facility] 07/06/2023

## 2023-07-07 DIAGNOSIS — S82852A Displaced trimalleolar fracture of left lower leg, initial encounter for closed fracture: Secondary | ICD-10-CM | POA: Diagnosis not present

## 2023-07-07 NOTE — Plan of Care (Signed)

## 2023-07-07 NOTE — Progress Notes (Signed)
PROGRESS NOTE  Timothy [name]  DOB: 12-07-1955  PCP: Wendi Maya, MD WUJ:[id number]  DOA: 06/25/2023  LOS: 10 days  Hospital Day: 13  Brief narrative: Timothy [name] is a 68 y.o. male with PMH significant for prostate cancer with vertebral mets, s/p back surgery, wheelchair-bound status, neurogenic bladder requiring self cath, chronic fecal incontinence, gonorrhea, schizophrenia, PTSD, behavioral disturbance who follows up at [facility] [place]. 2/6, patient presented to ED with complaint of bilateral leg swelling He states it has been present for months but became much worse yesterday redness and swelling noted left more than right. Patient was apparently seen in the ED on January 4 for a fall.  He underwent x-ray of left knee at that time which was unremarkable and was discharged.  It seems he did not have ankle x-ray done at that time.  Patient reports pain and swelling since that fall.  In the ED, patient was hemodynamically stable CBC/CMP mostly unremarkable Catheter urinalysis showed hazy yellow color urine with small hemoglobin, moderate leukocytes, positive nitrite, many bacteria UDS negative Chest x-ray negative for any acute infiltrates X-ray left ankle showed acute mildly displaced distal fibular shaft fracture, acute mildly displaced medial malleolar fracture, mild posterior subluxation of the talar dome with respect to the distal tibia. EDP discussed with orthopedics Dr. Hulda Humphrey. Admitted to [facility] Orthopedic consulted.  2/12, underwent ORIF of left ankle 2/14, started developing paranoia.  Apparently also APS involved on family.   Subjective:  Patient seen and examined.  Today he is focused on going home.  Then he got distracted and focused on his meal.  Looks more comfortable and interactive, quiet and composed today.  Assessment and plan: Left ankle trimalleolar fracture S/p ORIF -2/12 Dr. Jena Gauss Secondary to a fall 5 weeks ago. Pain regimen: Tylenol as needed, oxycodone  as needed Bowel regimen: MiraLAX as needed DVT prophylaxis with Lovenox in the hospital.  Aspirin on discharge.  Hypokalemia Potassium improved with replacement.  Will recheck electrolytes tomorrow. Recent Labs  Lab 07/01/23 0610 07/02/23 0537  K 3.3* 3.8   Abnormal urinalysis Neurogenic bladder H/o prostate cancer Abnormal urinalysis likely secondary to chronic self-catheterization  Currently no evidence of infection.  Not on antibiotics Flomax to continue   H/o back surgery Wheelchair-bound status   Schizophrenia Probably noncompliant to medications. Not sure if he was actually taking Cogentin, Haldol, sertraline.  This was prescribed before. Patient was able to verify that he takes haloperidol decanoate 50 mg injection every month and this was given 2/14.   Seen by psychiatry.  Additional haloperidol decanoate 50 mg was given on 2/17.  Recommended total 100 mg injection every month.  Continued Cogentin and Zoloft. Discontinued short acting helpful.    Mobility: Compromised mobility.  Now with further compromise.  Nonweightbearing left lower leg.  Refer to a SNF.  Medically stable if cleared by psych.  Goals of care   Code Status: Limited: Do not attempt resuscitation (DNR) -DNR-LIMITED -Do Not Intubate/DNI      DVT prophylaxis:  enoxaparin (LOVENOX) injection 40 mg Start: 07/02/23 0800 SCDs Start: 07/01/23 1324 SCDs Start: 06/25/23 2303   Antimicrobials: None Fluid: None Consultants: Orthopedics, psychiatry Family Communication: None at beside.  Not contacting his family as recommended by APS.  Status: Inpatient Level of care:  Med-Surg   Patient is from: Home Needs to continue in-hospital care: Unsafe discharge planning. Anticipated d/c to: Skilled nursing facility when bed available.  Medically stable.   Diet:  Diet Order  Diet regular Room service appropriate? Yes; Fluid consistency: Thin  Diet effective now                    Scheduled Meds:  acetaminophen  650 mg Oral Q6H   Or   acetaminophen  650 mg Rectal Q6H   atorvastatin  80 mg Oral Daily   benztropine  0.5 mg Oral Daily   cholecalciferol  1,000 Units Oral Daily   docusate sodium  100 mg Oral BID   enoxaparin (LOVENOX) injection  40 mg Subcutaneous Q24H   gabapentin  300 mg Oral TID   sertraline  100 mg Oral Daily   tamsulosin  0.4 mg Oral Daily    PRN meds: alum & mag hydroxide-simeth, cyclobenzaprine, haloperidol **OR** haloperidol lactate, labetalol, melatonin, metoCLOPramide **OR** metoCLOPramide (REGLAN) injection, ondansetron **OR** ondansetron (ZOFRAN) IV, oxyCODONE, polyethylene glycol, polyvinyl alcohol   Infusions:     Antimicrobials: Anti-infectives (From admission, onward)    Start     Dose/Rate Route Frequency Ordered Stop   07/01/23 2300  vancomycin (VANCOCIN) IVPB 1000 mg/200 mL premix        1,000 mg 200 mL/hr over 60 Minutes Intravenous  Once 07/01/23 1432 07/02/23 0121   07/01/23 1415  vancomycin (VANCOCIN) IVPB 1000 mg/200 mL premix  Status:  Discontinued        1,000 mg 200 mL/hr over 60 Minutes Intravenous Every 12 hours 07/01/23 1323 07/01/23 1432   07/01/23 1132  vancomycin (VANCOCIN) powder  Status:  Discontinued          As needed 07/01/23 1153 07/01/23 1211   07/01/23 1045  ceFAZolin (ANCEF) IVPB 3g/150 mL premix  Status:  Discontinued        3 g 300 mL/hr over 30 Minutes Intravenous On call to O.R. 07/01/23 0946 07/01/23 1321   07/01/23 1017  clindamycin (CLEOCIN) 900 MG/50ML IVPB       Note to Pharmacy: Camillia Herter A: cabinet override      07/01/23 1017 07/01/23 1127   07/01/23 0851  ceFAZolin (ANCEF) 3-4 GM/150ML-% IVPB  Status:  Discontinued       Note to Pharmacy: Shanda Bumps M: cabinet override      07/01/23 0851 07/01/23 1037   07/01/23 0848  ceFAZolin (ANCEF) 2-4 GM/100ML-% IVPB  Status:  Discontinued       Note to Pharmacy: Shanda Bumps M: cabinet override      07/01/23 0848 07/01/23  0852   06/25/23 1630  cefTRIAXone (ROCEPHIN) 1 g in sodium chloride 0.9 % 100 mL IVPB        1 g 200 mL/hr over 30 Minutes Intravenous  Once 06/25/23 1621 06/25/23 1827       Objective: Vitals:   07/07/23 0446 07/07/23 0749  BP: 117/75 125/73  Pulse: 67 87  Resp: 17 17  Temp: 98.4 F (36.9 C) 98.3 F (36.8 C)  SpO2: 98% 97%    Intake/Output Summary (Last 24 hours) at 07/07/2023 1307 Last data filed at 07/07/2023 0745 Gross per 24 hour  Intake 240 ml  Output 2200 ml  Net -1960 ml   Filed Weights   06/25/23 1430 07/01/23 0936  Weight: 124 kg 123.8 kg   Weight change:  Body mass index is 35.05 kg/m.   Physical Exam: General: Alert awake.  Flat affect.  Denies any delusions or hallucinations.  Tangential thoughts. Cardiovascular: S1-S2 normal. Respiratory: Bilateral clear. Gastrointestinal: Soft nontender. Ext: Left leg on splint and immobilized. Neuro: Intact.  Unable to move legs due  to chronic weakness and now with a splint.   Data Review: I have personally reviewed the laboratory data and studies available.  F/u labs  Unresulted Labs (From admission, onward)     Start     Ordered   07/08/23 0500  Creatinine, serum  (enoxaparin (LOVENOX)    CrCl >/= 30 ml/min)  Weekly,   R     Comments: while on enoxaparin therapy   Question:  Specimen collection method  Answer:  Lab=Lab collect   07/01/23 1323            Total time spent in review of labs and imaging, patient evaluation, formulation of plan, documentation and communication with family: 35 minutes  Signed, Dorcas Carrow, MD [facility] 07/07/2023

## 2023-07-07 NOTE — Evaluation (Signed)
Physical Therapy Evaluation Patient Details Name: Timothy [name] MRN: [id number] DOB: 01-10-1956 Today's Date: 07/07/2023  History of Present Illness  68 y/o male presents to [facility] ED on 2/6 for bilateral leg swelling/pain, redness and swelling noted to L>R. Imaging findings result in trimalleolar fx of the L ankle, mildly displaced distal fibular shaft fx, mild posterior subluxation of talar dome. S/p ORIF of L trimalleolar ankle fx and internal fixation of L syndesmosis 2/12. PMH includes history of schizophrenia, chronic neurogenic bladder with a history of self catheterizations, chronic fecal incontinence, history of chronic back pain, prostate cx w/ mets to spine.  Clinical Impression  Pt with increased tangential conversation and needs redirecting for tasks. Pt requiring two person minimal assist for bed mobility and stand pivot transfers using RW. Needs consistent cueing for precautions. Patient will benefit from continued inpatient follow up therapy, <3 hours/day to address deficits and maximize functional mobility.       If plan is discharge home, recommend the following: Two people to help with walking and/or transfers;Two people to help with bathing/dressing/bathroom;Direct supervision/assist for medications management;Help with stairs or ramp for entrance;Assist for transportation;Assistance with cooking/housework;Direct supervision/assist for financial management;Supervision due to cognitive status   Can travel by private vehicle   No    Equipment Recommendations None recommended by PT  Recommendations for Other Services       Functional Status Assessment       Precautions / Restrictions Precautions Precautions: Fall Recall of Precautions/Restrictions: Impaired Precaution/Restrictions Comments: pt able to verbalized NWB, but tends to use functionally Required Braces or Orthoses: Splint/Cast Splint/Cast: Stirrup splint, Post (short leg) splint Splint/Cast - Date  Prophylactic Dressing Applied (if applicable): 06/25/23 Restrictions Weight Bearing Restrictions Per Provider Order: Yes LLE Weight Bearing Per Provider Order: Non weight bearing      Mobility  Bed Mobility Overal bed mobility: Needs Assistance Bed Mobility: Rolling, Supine to Sit, Sit to Supine Rolling: Min assist   Supine to sit: Min assist Sit to supine: Contact guard assist   General bed mobility comments: Pt transitioned to right side of bed with minA to pull up to sitting position. Pt with bowel incontinence once sitting, so redirected to lie back down and onto bed pan. Rolling to R/L for peri care with minA.    Transfers Overall transfer level: Needs assistance Equipment used: Rolling walker (2 wheels) Transfers: Sit to/from Stand, Bed to chair/wheelchair/BSC Sit to Stand: +2 physical assistance, Min assist Stand pivot transfers: +2 physical assistance, Min assist         General transfer comment: MinA + 2 to rise from elevated bed height with cues for weightbearing precautions; pivoting towards right to chair    Ambulation/Gait                  Stairs            Wheelchair Mobility     Tilt Bed    Modified Rankin (Stroke Patients Only)       Balance Overall balance assessment: Needs assistance Sitting-balance support: Feet supported, Bilateral upper extremity supported, Single extremity supported Sitting balance-Leahy Scale: Good     Standing balance support: Bilateral upper extremity supported Standing balance-Leahy Scale: Poor Standing balance comment: reliant on RW                             Pertinent Vitals/Pain Pain Assessment Pain Assessment: No/denies pain    Home Living  Prior Function                       Extremity/Trunk Assessment                Communication   Communication Communication: No apparent difficulties    Cognition Arousal: Alert Behavior  During Therapy: Anxious (tangential)   PT - Cognitive impairments: Awareness, Attention, Sequencing, Problem solving, Safety/Judgement                       PT - Cognition Comments: Increased delusions, very tangential, poor safety awareness Following commands: Impaired Following commands impaired: Follows one step commands with increased time, Follows multi-step commands inconsistently     Cueing Cueing Techniques: Verbal cues, Tactile cues     General Comments      Exercises     Assessment/Plan    PT Assessment    PT Problem List         PT Treatment Interventions      PT Goals (Current goals can be found in the Care Plan section)  Acute Rehab PT Goals Potential to Achieve Goals: Fair    Frequency Min 1X/week     Co-evaluation               AM-PAC PT "6 Clicks" Mobility  Outcome Measure Help needed turning from your back to your side while in a flat bed without using bedrails?: A Little Help needed moving from lying on your back to sitting on the side of a flat bed without using bedrails?: A Little Help needed moving to and from a bed to a chair (including a wheelchair)?: A Little Help needed standing up from a chair using your arms (e.g., wheelchair or bedside chair)?: A Little Help needed to walk in hospital room?: Total Help needed climbing 3-5 steps with a railing? : Total 6 Click Score: 14    End of Session Equipment Utilized During Treatment: Gait belt Activity Tolerance: Patient tolerated treatment well Patient left: in chair;with call bell/phone within reach;with chair alarm set Nurse Communication: Mobility status PT Visit Diagnosis: Other abnormalities of gait and mobility (R26.89);History of falling (Z91.81);Muscle weakness (generalized) (M62.81)    Time: 1610-9604 PT Time Calculation (min) (ACUTE ONLY): 36 min   Charges:     PT Treatments $Therapeutic Activity: 23-37 mins PT General Charges $$ ACUTE PT VISIT: 1 Visit          Lillia Pauls, PT, DPT Acute Rehabilitation Services Office [phone]   Norval Morton 07/07/2023, 2:53 PM

## 2023-07-07 NOTE — TOC Progression Note (Addendum)
Transition of Care [facility]) - Progression Note    Patient Details  Name: Timothy [name] MRN: [id number] Date of Birth: [birth year]  [facility]) CM/SW Contact  Lorri Frederick, LCSW Phone Number: 07/07/2023, 8:30 AM  Clinical Narrative:   passr: [phone] E  1300: no bed offers.  [facility] responded that they would need face to face eval before they could offer bed. Domingo Dimes can come by the hospital to do this tomorrow.   1530: All [facility] SNF request paperwork completed, signed by MD, emailed to [employer] at [facility].   Expected Discharge Plan: Skilled Nursing Facility Barriers to Discharge: Continued Medical Work up, English as a second language teacher, SNF Pending bed offer  Expected Discharge Plan and Services In-house Referral: Clinical Social Work   Post Acute Care Choice: Skilled Nursing Facility Living arrangements for the past 2 months: Single Family Home                                       Social Determinants of Health (SDOH) Interventions SDOH Screenings   Food Insecurity: Patient Declined (06/26/2023)  Housing: Patient Declined (06/26/2023)  Transportation Needs: Patient Declined (06/26/2023)  Utilities: Patient Declined (06/26/2023)  Social Connections: Unknown (06/26/2023)  Tobacco Use: Medium Risk (07/01/2023)    Readmission Risk Interventions     No data to display

## 2023-07-07 NOTE — Progress Notes (Signed)
Occupational Therapy Treatment Patient Details Name: Timothy [name] MRN: [id number] DOB: January 11, 1956 Today's Date: 07/07/2023   History of present illness 68 y/o male presents to [facility] ED on 2/6 for bilateral leg swelling/pain, redness and swelling noted to L>R. Imaging findings result in trimalleolar fx of the L ankle, mildly displaced distal fibular shaft fx, mild posterior subluxation of talar dome. S/p ORIF of L trimalleolar ankle fx and internal fixation of L syndesmosis 2/12. PMH includes history of schizophrenia, chronic neurogenic bladder with a history of self catheterizations, chronic fecal incontinence, history of chronic back pain, prostate cx w/ mets to spine.   OT comments  Patient in recliner upon entry and agreeable to OT session. Patient tangential and requires frequent redirection throughout session. Requires cueing to adhere to NWB to L LE.  Patient educated on AE, sock aide and reacher, returned demonstrates with supervision; overall requires max assist for LB dressing. Min assist for chair pushups to keep L LE NWB.  Will follow acutely.  Continue to recommend <3hrs/day inpatient setting.       If plan is discharge home, recommend the following:  Assistance with cooking/housework;Direct supervision/assist for medications management;Direct supervision/assist for financial management;Assist for transportation;Help with stairs or ramp for entrance;Two people to help with bathing/dressing/bathroom;Two people to help with walking and/or transfers;Supervision due to cognitive status   Equipment Recommendations  Other (comment) (defer)    Recommendations for Other Services      Precautions / Restrictions Precautions Precautions: Fall Recall of Precautions/Restrictions: Impaired Precaution/Restrictions Comments: pt able to verbalized NWB, but tends to use functionally Required Braces or Orthoses: Splint/Cast Splint/Cast: Stirrup splint, Post (short leg) splint Splint/Cast - Date  Prophylactic Dressing Applied (if applicable): 06/25/23 Restrictions Weight Bearing Restrictions Per Provider Order: Yes LLE Weight Bearing Per Provider Order: Non weight bearing       Mobility Bed Mobility                    Transfers                         Balance                                           ADL either performed or assessed with clinical judgement   ADL Overall ADL's : Needs assistance/impaired                     Lower Body Dressing: Maximal assistance;Sitting/lateral leans Lower Body Dressing Details (indicate cue type and reason): educated on AE, reacher and sock aide, min assist to keep L LE NWB and relies on BUE support to boost hips                    Extremity/Trunk Assessment              Vision       Perception     Praxis     Communication Communication Communication: No apparent difficulties   Cognition Arousal: Alert Behavior During Therapy: Anxious (tangential) Cognition: No family/caregiver present to determine baseline     Awareness: Intellectual awareness intact, Online awareness impaired Memory impairment (select all impairments): Short-term memory, Working memory Attention impairment (select first level of impairment): Sustained attention Executive functioning impairment (select all impairments): Organization, Sequencing, Reasoning, Problem solving OT - Cognition Comments: patient tangential, requires frequent  redirection to task. poor safety awareness                 Following commands: Impaired Following commands impaired: Follows one step commands with increased time, Follows multi-step commands inconsistently      Cueing   Cueing Techniques: Verbal cues, Tactile cues  Exercises Exercises: Other exercises Other Exercises Other Exercises: chair pushups x 10 reps 2 sets (inital set 15, before able to stop him)    Shoulder Instructions       General  Comments      Pertinent Vitals/ Pain       Pain Assessment Pain Assessment: No/denies pain  Home Living                                          Prior Functioning/Environment              Frequency  Min 1X/week        Progress Toward Goals  OT Goals(current goals can now be found in the care plan section)  Progress towards OT goals: Progressing toward goals  Acute Rehab OT Goals Patient Stated Goal: home OT Goal Formulation: With patient Time For Goal Achievement: 07/13/23 Potential to Achieve Goals: Fair  Plan      Co-evaluation                 AM-PAC OT "6 Clicks" Daily Activity     Outcome Measure   Help from another person eating meals?: A Little Help from another person taking care of personal grooming?: A Little Help from another person toileting, which includes using toliet, bedpan, or urinal?: Total Help from another person bathing (including washing, rinsing, drying)?: Total Help from another person to put on and taking off regular upper body clothing?: A Little Help from another person to put on and taking off regular lower body clothing?: Total 6 Click Score: 12    End of Session    OT Visit Diagnosis: Unsteadiness on feet (R26.81);Muscle weakness (generalized) (M62.81);Other symptoms and signs involving cognitive function;History of falling (Z91.81)   Activity Tolerance Patient tolerated treatment well   Patient Left with call bell/phone within reach;with chair alarm set;in chair   Nurse Communication Mobility status        Time: 1610-9604 OT Time Calculation (min): 16 min  Charges: OT General Charges $OT Visit: 1 Visit OT Treatments $Self Care/Home Management : 8-22 mins  Timothy [name], OT Acute Rehabilitation Services Office [phone]   Timothy [name] 07/07/2023, 1:41 PM

## 2023-07-08 DIAGNOSIS — S82852A Displaced trimalleolar fracture of left lower leg, initial encounter for closed fracture: Secondary | ICD-10-CM | POA: Diagnosis not present

## 2023-07-08 LAB — CREATININE, SERUM
Creatinine, Ser: 0.92 mg/dL (ref 0.61–1.24)
GFR, Estimated: >60 mL/min (ref >60)

## 2023-07-08 NOTE — Progress Notes (Signed)
PROGRESS NOTE  Timothy [name]  DOB: 1956-01-21  PCP: Wendi Maya, MD ZOX:[id number]  DOA: 06/25/2023  LOS: 11 days  Hospital Day: 14  Brief narrative: Timothy [name] is a 67 y.o. male with PMH significant for prostate cancer with vertebral mets, s/p back surgery, wheelchair-bound status, neurogenic bladder requiring self cath, chronic fecal incontinence, gonorrhea, schizophrenia, PTSD, behavioral disturbance who follows up at [facility]. 2/6, patient presented to ED with complaint of bilateral leg swelling He states it has been present for months but became much worse yesterday redness and swelling noted left more than right. Patient was apparently seen in the ED on January 4 for a fall.  He underwent x-ray of left knee at that time which was unremarkable and was discharged.  It seems he did not have ankle x-ray done at that time.  Patient reports pain and swelling since that fall.  In the ED, patient was hemodynamically stable CBC/CMP mostly unremarkable Catheter urinalysis showed hazy yellow color urine with small hemoglobin, moderate leukocytes, positive nitrite, many bacteria UDS negative Chest x-ray negative for any acute infiltrates X-ray left ankle showed acute mildly displaced distal fibular shaft fracture, acute mildly displaced medial malleolar fracture, mild posterior subluxation of the talar dome with respect to the distal tibia. EDP discussed with orthopedics Dr. Hulda Humphrey. Admitted to [facility] consulted.  2/12, underwent ORIF of left ankle 2/14, started developing paranoia.  Apparently also APS involved on family.  2/18, improving.  Insist on going home however he cannot go home.  Subjective:  Patient seen and examined.  More calm and composed today.  Patient insist that he needs to go home and learn to walk.  I told him we are waiting for him to go to a skilled nursing facility approved by his VA benefits and he is consolable.  Still has slight pressured speech  however he is more composed today.  Assessment and plan: Left ankle trimalleolar fracture S/p ORIF -2/12 Dr. Jena Gauss Secondary to a fall 5 weeks ago. Pain regimen: Tylenol as needed, oxycodone as needed Bowel regimen: MiraLAX as needed DVT prophylaxis with Lovenox in the hospital.  Aspirin on discharge.  Hypokalemia Potassium improved with replacement.  Will recheck electrolytes today. Recent Labs  Lab 07/02/23 0537  K 3.8   Abnormal urinalysis Neurogenic bladder H/o prostate cancer Abnormal urinalysis likely secondary to chronic self-catheterization  Currently no evidence of infection.  Not on antibiotics Flomax to continue   H/o back surgery Wheelchair-bound status   Schizophrenia Probably noncompliant to medications. Not sure if he was actually taking Cogentin, Haldol, sertraline.  This was prescribed before. Patient was able to verify that he takes haloperidol decanoate 50 mg injection every month and this was given 2/14.   Started back on Haldol 5 mg at night, Cogentin, Zoloft 100 mg.  He has Some symptom improvement with mild tangential thoughts and distractibility.  Seen by psychiatry.     Mobility: Compromised mobility.  Now with further compromise.  Nonweightbearing left lower leg.  Refer to a SNF.  Medically stable if cleared by psych.  Goals of care   Code Status: Limited: Do not attempt resuscitation (DNR) -DNR-LIMITED -Do Not Intubate/DNI      DVT prophylaxis:  enoxaparin (LOVENOX) injection 40 mg Start: 07/02/23 0800 SCDs Start: 07/01/23 1324 SCDs Start: 06/25/23 2303   Antimicrobials: None Fluid: None Consultants: Orthopedics, psychiatry Family Communication: None at beside  Status: Inpatient Level of care:  Med-Surg   Patient is from: Home Needs to continue  in-hospital care: Unsafe discharge planning. Anticipated d/c to: Skilled nursing facility when bed available.     Diet:  Diet Order             Diet regular Room service appropriate?  Yes; Fluid consistency: Thin  Diet effective now                   Scheduled Meds:  acetaminophen  650 mg Oral Q6H   Or   acetaminophen  650 mg Rectal Q6H   atorvastatin  80 mg Oral Daily   benztropine  0.5 mg Oral Daily   cholecalciferol  1,000 Units Oral Daily   docusate sodium  100 mg Oral BID   enoxaparin (LOVENOX) injection  40 mg Subcutaneous Q24H   gabapentin  300 mg Oral TID   sertraline  100 mg Oral Daily   tamsulosin  0.4 mg Oral Daily    PRN meds: alum & mag hydroxide-simeth, cyclobenzaprine, haloperidol **OR** haloperidol lactate, labetalol, melatonin, metoCLOPramide **OR** metoCLOPramide (REGLAN) injection, ondansetron **OR** ondansetron (ZOFRAN) IV, oxyCODONE, polyethylene glycol, polyvinyl alcohol   Infusions:     Antimicrobials: Anti-infectives (From admission, onward)    Start     Dose/Rate Route Frequency Ordered Stop   07/01/23 2300  vancomycin (VANCOCIN) IVPB 1000 mg/200 mL premix        1,000 mg 200 mL/hr over 60 Minutes Intravenous  Once 07/01/23 1432 07/02/23 0121   07/01/23 1415  vancomycin (VANCOCIN) IVPB 1000 mg/200 mL premix  Status:  Discontinued        1,000 mg 200 mL/hr over 60 Minutes Intravenous Every 12 hours 07/01/23 1323 07/01/23 1432   07/01/23 1132  vancomycin (VANCOCIN) powder  Status:  Discontinued          As needed 07/01/23 1153 07/01/23 1211   07/01/23 1045  ceFAZolin (ANCEF) IVPB 3g/150 mL premix  Status:  Discontinued        3 g 300 mL/hr over 30 Minutes Intravenous On call to O.R. 07/01/23 0946 07/01/23 1321   07/01/23 1017  clindamycin (CLEOCIN) 900 MG/50ML IVPB       Note to Pharmacy: Camillia Herter A: cabinet override      07/01/23 1017 07/01/23 1127   07/01/23 0851  ceFAZolin (ANCEF) 3-4 GM/150ML-% IVPB  Status:  Discontinued       Note to Pharmacy: Shanda Bumps M: cabinet override      07/01/23 0851 07/01/23 1037   07/01/23 0848  ceFAZolin (ANCEF) 2-4 GM/100ML-% IVPB  Status:  Discontinued       Note to  Pharmacy: Shanda Bumps M: cabinet override      07/01/23 0848 07/01/23 0852   06/25/23 1630  cefTRIAXone (ROCEPHIN) 1 g in sodium chloride 0.9 % 100 mL IVPB        1 g 200 mL/hr over 30 Minutes Intravenous  Once 06/25/23 1621 06/25/23 1827       Objective: Vitals:   07/08/23 0455 07/08/23 0759  BP: 121/75 110/78  Pulse: 74 87  Resp: 18 18  Temp: (!) 97.4 F (36.3 C) 98.2 F (36.8 C)  SpO2: 99% 100%    Intake/Output Summary (Last 24 hours) at 07/08/2023 0909 Last data filed at 07/08/2023 0834 Gross per 24 hour  Intake 720 ml  Output 2750 ml  Net -2030 ml   Filed Weights   06/25/23 1430 07/01/23 0936  Weight: 124 kg 123.8 kg   Weight change:  Body mass index is 35.05 kg/m.   Physical Exam: General: Alert awake.  Mostly oriented to place and situation.  Able to keep up conversation. Cardiovascular: S1-S2 normal. Respiratory: Bilateral clear. Gastrointestinal: Soft nontender. Ext: Left leg on splint and immobilized. Neuro: Intact.  Unable to move legs due to chronic weakness and now with a splint.   Data Review: I have personally reviewed the laboratory data and studies available.  F/u labs  Unresulted Labs (From admission, onward)     Start     Ordered   07/08/23 0500  Creatinine, serum  (enoxaparin (LOVENOX)    CrCl >/= 30 ml/min)  Weekly,   R     Comments: while on enoxaparin therapy   Question:  Specimen collection method  Answer:  Lab=Lab collect   07/01/23 1323            Total time spent in review of labs and imaging, patient evaluation, formulation of plan, documentation and communication with family: 35 minutes  Signed, Dorcas Carrow, MD [facility] 07/08/2023

## 2023-07-08 NOTE — TOC Progression Note (Addendum)
Transition of Care [facility] - Progression Note    Patient Details  Name: Timothy [name] MRN: [id number] Date of Birth: Jul 04, 1955  Transition of [facility]) CM/SW Contact  Lorri Frederick, LCSW Phone Number: 07/08/2023, 9:48 AM  Clinical Narrative:   Radene Journey, [facility]. He visited with pt this AM, unable to offer bed.  Pt was somewhat rude, Domingo Dimes does not think it would go well with pt having a roommate.  TC from [facility], APS.  Update provided.    Expected Discharge Plan: Skilled Nursing Facility Barriers to Discharge: Continued Medical Work up, English as a second language teacher, SNF Pending bed offer  Expected Discharge Plan and Services In-house Referral: Clinical Social Work   Post Acute Care Choice: Skilled Nursing Facility Living arrangements for the past 2 months: Single Family Home                                       Social Determinants of Health (SDOH) Interventions SDOH Screenings   Food Insecurity: Patient Declined (06/26/2023)  Housing: Patient Declined (06/26/2023)  Transportation Needs: Patient Declined (06/26/2023)  Utilities: Patient Declined (06/26/2023)  Social Connections: Unknown (06/26/2023)  Tobacco Use: Medium Risk (07/01/2023)    Readmission Risk Interventions     No data to display

## 2023-07-08 NOTE — Consult Note (Signed)
Brief Psychiatry Consult Note  Attempted to evaluate pt today re: EPS/side effects of IM haldol dec earlier this week. Had some abnormal blinking not sustained through very brief interview. Made a few paranoid statements. Made several vulgar comments about my pregnancy and started talking about stillbirth, implying/wishing I would lose pregnancy, etc. Interview terminated. I did not do physical exam.   He can be seen by psych consult service tomorrow to f/u on above. Please contact ED APP if any behavioral concerns today, have briefly discussed case. No charge.   Wess Baney A Bernise Sylvain

## 2023-07-09 DIAGNOSIS — S82852A Displaced trimalleolar fracture of left lower leg, initial encounter for closed fracture: Secondary | ICD-10-CM | POA: Diagnosis not present

## 2023-07-09 MED ORDER — HALOPERIDOL 5 MG PO TABS
5.0000 mg | ORAL_TABLET | Freq: Every day | ORAL | Status: DC
  Administered 2023-07-09 – 2023-07-21 (×10): 5 mg via ORAL
  Filled 2023-07-09 (×15): qty 1

## 2023-07-09 NOTE — TOC Progression Note (Signed)
Transition of Care [facility]) - Progression Note    Patient Details  Name: Timothy [name] MRN: [id number] Date of Birth: [birth year]  Transition of Care [facility]) CM/SW Contact  Lorri Frederick, LCSW Phone Number: 07/09/2023, 10:11 AM  Clinical Narrative:   CSW has received confirmation that [facility] is working on [employer] authorization.   Pt continues to have no SNF bed offers. Psych currently following as pt currently showing increased psychiatric symptoms.  CSW not making additional SNF referrals pending psychiatric improvement.    Expected Discharge Plan: Skilled Nursing Facility Barriers to Discharge: Continued Medical Work up, English as a second language teacher, SNF Pending bed offer  Expected Discharge Plan and Services In-house Referral: Clinical Social Work   Post Acute Care Choice: Skilled Nursing Facility Living arrangements for the past 2 months: Single Family Home                                       Social Determinants of Health (SDOH) Interventions SDOH Screenings   Food Insecurity: Patient Declined (06/26/2023)  Housing: Patient Declined (06/26/2023)  Transportation Needs: Patient Declined (06/26/2023)  Utilities: Patient Declined (06/26/2023)  Social Connections: Unknown (06/26/2023)  Tobacco Use: Medium Risk (07/01/2023)    Readmission Risk Interventions     No data to display

## 2023-07-09 NOTE — Progress Notes (Signed)
PROGRESS NOTE  Timothy [name]  DOB: February 28, 1956  PCP: Wendi Maya, MD ZOX:[id number]  DOA: 06/25/2023  LOS: 12 days  Hospital Day: 15  Brief narrative: Timothy [name] is a 68 y.o. male with PMH significant for prostate cancer with vertebral mets, s/p back surgery, wheelchair-bound status, neurogenic bladder requiring self cath, chronic fecal incontinence, gonorrhea, schizophrenia, PTSD, behavioral disturbance who follows up at [facility] [place]. 2/6, patient presented to ED with complaint of bilateral leg swelling He states it has been present for months but became much worse yesterday redness and swelling noted left more than right. Patient was apparently seen in the ED on January 4 for a fall.  He underwent x-ray of left knee at that time which was unremarkable and was discharged.  It seems he did not have ankle x-ray done at that time.  Patient reports pain and swelling since that fall.  In the ED, patient was hemodynamically stable CBC/CMP mostly unremarkable Catheter urinalysis showed hazy yellow color urine with small hemoglobin, moderate leukocytes, positive nitrite, many bacteria UDS negative Chest x-ray negative for any acute infiltrates X-ray left ankle showed acute mildly displaced distal fibular shaft fracture, acute mildly displaced medial malleolar fracture, mild posterior subluxation of the talar dome with respect to the distal tibia. EDP discussed with orthopedics Dr. Hulda Humphrey. Admitted to [facility] Orthopedic consulted.  2/12, underwent ORIF of left ankle 2/14, started developing paranoia.  Apparently also APS involved on family.  2/18, improving.  Insist on going home however he cannot go home. 2/19, paranoid.  Loss of insight.  Followed by psych.  Subjective:  Patient is seen and examined.  Continues to make statements about going home.  Easily distractible.  Difficult to counsel.  "Give me the walker, I would walk home".  Does not understand the severity of problem on his  left ankle.  He just needs to go home. Patient is not agitated or threatening, he is not able to get out of the bed by himself.  He needs a Nurse, adult. Patient has to stay in the hospital until psychiatric symptoms cleared and likely can go to one of the skilled nursing facility.  He may end up being a long-term hospitalization.  Assessment and plan: Left ankle trimalleolar fracture S/p ORIF -2/12 Dr. Jena Gauss Secondary to fall 5 weeks ago. Pain regimen: Tylenol as needed, oxycodone as needed Bowel regimen: MiraLAX as needed DVT prophylaxis with Lovenox in the hospital.  Aspirin on discharge.  Hypokalemia Potassium improved with replacement.     Abnormal urinalysis Neurogenic bladder H/o prostate cancer Abnormal urinalysis likely secondary to chronic self-catheterization  Currently no evidence of infection.  Not on antibiotics Flomax to continue No urinary retention.   H/o back surgery Wheelchair-bound status.  Difficult disposition.   Acute exacerbation of schizophrenia At home noncompliant to medications. Not sure if he was actually taking Cogentin, Haldol, sertraline.  This was prescribed before. Patient was given haloperidol decanoate 50 mg on 2/14, 50 mg on 2/17.   Next haloperidol decanoate 100 mg on 3/17.   On Cogentin, gabapentin and sertraline.   Now developing florid schizophrenic symptoms.  Psychiatry continues to follow.   Mobility: Compromised mobility.  Now with further compromise.  Nonweightbearing left lower leg.  Refer to a SNF.  Surgically stable.  Psychiatric symptoms not controlled today.  Goals of care   Code Status: Limited: Do not attempt resuscitation (DNR) -DNR-LIMITED -Do Not Intubate/DNI      DVT prophylaxis:  enoxaparin (LOVENOX) injection 40 mg Start:  07/02/23 0800 SCDs Start: 07/01/23 1324 SCDs Start: 06/25/23 2303   Antimicrobials: None Fluid: None Consultants: Orthopedics, psychiatry Family Communication: None at beside.   Status:  Inpatient Level of care:  Med-Surg   Patient is from: Home Needs to continue in-hospital care: Unsafe discharge planning. Anticipated d/c to: Skilled nursing facility when bed available and cleared by psych.   Diet:  Diet Order             Diet regular Room service appropriate? Yes; Fluid consistency: Thin  Diet effective now                   Scheduled Meds:  acetaminophen  650 mg Oral Q6H   Or   acetaminophen  650 mg Rectal Q6H   atorvastatin  80 mg Oral Daily   benztropine  0.5 mg Oral Daily   cholecalciferol  1,000 Units Oral Daily   docusate sodium  100 mg Oral BID   enoxaparin (LOVENOX) injection  40 mg Subcutaneous Q24H   gabapentin  300 mg Oral TID   sertraline  100 mg Oral Daily   tamsulosin  0.4 mg Oral Daily    PRN meds: alum & mag hydroxide-simeth, cyclobenzaprine, haloperidol **OR** haloperidol lactate, labetalol, melatonin, metoCLOPramide **OR** metoCLOPramide (REGLAN) injection, ondansetron **OR** ondansetron (ZOFRAN) IV, oxyCODONE, polyethylene glycol, polyvinyl alcohol   Infusions:     Antimicrobials: Anti-infectives (From admission, onward)    Start     Dose/Rate Route Frequency Ordered Stop   07/01/23 2300  vancomycin (VANCOCIN) IVPB 1000 mg/200 mL premix        1,000 mg 200 mL/hr over 60 Minutes Intravenous  Once 07/01/23 1432 07/02/23 0121   07/01/23 1415  vancomycin (VANCOCIN) IVPB 1000 mg/200 mL premix  Status:  Discontinued        1,000 mg 200 mL/hr over 60 Minutes Intravenous Every 12 hours 07/01/23 1323 07/01/23 1432   07/01/23 1132  vancomycin (VANCOCIN) powder  Status:  Discontinued          As needed 07/01/23 1153 07/01/23 1211   07/01/23 1045  ceFAZolin (ANCEF) IVPB 3g/150 mL premix  Status:  Discontinued        3 g 300 mL/hr over 30 Minutes Intravenous On call to O.R. 07/01/23 0946 07/01/23 1321   07/01/23 1017  clindamycin (CLEOCIN) 900 MG/50ML IVPB       Note to Pharmacy: Camillia Herter A: cabinet override      07/01/23  1017 07/01/23 1127   07/01/23 0851  ceFAZolin (ANCEF) 3-4 GM/150ML-% IVPB  Status:  Discontinued       Note to Pharmacy: Shanda Bumps M: cabinet override      07/01/23 0851 07/01/23 1037   07/01/23 0848  ceFAZolin (ANCEF) 2-4 GM/100ML-% IVPB  Status:  Discontinued       Note to Pharmacy: Shanda Bumps M: cabinet override      07/01/23 0848 07/01/23 0852   06/25/23 1630  cefTRIAXone (ROCEPHIN) 1 g in sodium chloride 0.9 % 100 mL IVPB        1 g 200 mL/hr over 30 Minutes Intravenous  Once 06/25/23 1621 06/25/23 1827       Objective: Vitals:   07/08/23 2042 07/09/23 0803  BP: 118/74 115/78  Pulse:  82  Resp: 17 18  Temp: 98 F (36.7 C)   SpO2: 99% 96%    Intake/Output Summary (Last 24 hours) at 07/09/2023 0945 Last data filed at 07/09/2023 8119 Gross per 24 hour  Intake --  Output 1600 ml  Net -1600 ml   Filed Weights   06/25/23 1430 07/01/23 0936  Weight: 124 kg 123.8 kg   Weight change:  Body mass index is 35.05 kg/m.   Physical Exam: General: Alert and awake.  Mumbling.  Blinking eyes consistently.  Easily distractible. Cardiovascular: S1-S2 normal. Respiratory: Bilateral clear. Gastrointestinal: Soft nontender. Ext: Left leg on splint and immobilized. Neuro: Unable to move legs due to chronic weakness and now with a splint. Psych: Blinking eyes repeatedly, easily distractible, loss of insight.  Tangential thoughts and flight of ideas.   Data Review: I have personally reviewed the laboratory data and studies available.  F/u labs  Unresulted Labs (From admission, onward)     Start     Ordered   07/08/23 0500  Creatinine, serum  (enoxaparin (LOVENOX)    CrCl >/= 30 ml/min)  Weekly,   R     Comments: while on enoxaparin therapy   Question:  Specimen collection method  Answer:  Lab=Lab collect   07/01/23 1323            Total time spent in review of labs and imaging, patient evaluation, formulation of plan, documentation and communication with  family: 35 minutes  Signed, Dorcas Carrow, MD [facility] 07/09/2023

## 2023-07-09 NOTE — Consult Note (Signed)
[facility] Psychiatric Consult Initial  Patient Name: .MILUS [name]  MRN: [id number]  DOB: 29-Sep-1955  Consult Order details:  Orders (From admission, onward)     Start     Ordered   07/04/23 1726  IP CONSULT TO PSYCHIATRY       Ordering Provider: Dorcas Carrow, MD  Provider:  (Not yet assigned)  Question Answer Comment  Location [facility]   Reason for Consult? Paranoid schizophrenia exacerbation      07/04/23 1726             Mode of Visit: In person    Psychiatry Consult Evaluation  Service Date: July 09, 2023 LOS:  LOS: 12 days  Chief Complaint "I'm doing ok"  Primary Psychiatric Diagnoses  1.Schizophrenia-by history 2.  PTSD-by history  Assessment  Timothy [name] is a 68 y.o. male admitted: Medicallyfor 06/25/2023  2:24 PM for bilateral leg swelling due to fall. He carries the psychiatric diagnoses of schizophrenia and PTSD and has a past medical history of prostate cancer with vertebral mets, s/p back surgery, wheelchair-bound status, neurogenic bladder requiring self cath, chronic fecal incontinence, and gonorrhea.  His initial presentation of disorganized speech, circumstantial and tangential thought process is most consistent with schizophrenia. Feel that his paranoia/delusions which are only present at night are more c/w delirium from UTI.  He does not meet criteria for inpatient admission based on absent suicidal or homicidal ideation, intent or plan.  Current outpatient psychotropic medications include Haldol decanoate 100 mg q 4 weeks, Zoloft 100 mg daily and Cogentin 0.5 mg daily and historically he has had a moderate response to these medications. He was compliant with haldol dec per pharmacy record and not compliant with PO meds per pt report earlier this admission.  On initial examination, patient is awake, alert and oriented x 3 which has generally been true during the day. He got half his dose of haldol dec 2/14 for unidentified  reasons - giving 50 mg again 2/17, stopping PO haldol, and will note he is due on 3/17  07/09/2023: Patient is asleep in bed upon initial approach, but arouses easily.  He is able to state his name, his address year and month.  He is unable to state specific date, but then also says I do not have a calendar in my room, which is accurate.  He describes that at home he was not continuing to take his medications regularly and had missed doses specifically of amylopsin, melatonin and Percocet.  He believes that he also may have missed a Haldol decanoate injection.  He reports that he lives with his nephew and has other family members that check in on him.  He is agreeable to discharge planning to a [facility].  He specifically denied any SI, HI, AVH and did not appear to be responding to internal stimuli.  His behavior was appropriate with me today.  Seems to be at or near baseline. No EPS today.   MAR: He received   Diagnoses:  Active Hospital problems: Principal Problem:   Trimalleolar fracture of ankle, closed, left, initial encounter Active Problems:   Self-catheterizes urinary bladder   Schizophrenia (HCC)   Neurogenic bladder   Essential (primary) hypertension   Fecal incontinence   DNR (do not resuscitate)/DNI(Do not intubate)    Plan  ## Psychiatric Medication Recommendations:  --Continue Zoloft 100 mg daily for depression --Start scheduled Haldol 5 mg HS for night time agitation --Continue Cogentin to 1 mg at  bedtime for EPS. --Give repeat haldol dec 50 mg 2/17 next dose 100 mg due 3/17  -  -- Haldol 5 mg PO/2 mg IM for agitation protocol --Continue medical treatment as the primary team.   ## Medical Decision Making Capacity: Not specifically addressed in this encounter  ## Further Work-up: None  -- most recent EKG on 2/17 had QtC of 417  -- Please monitor Qtc and check for EPS.  -- Pertinent labwork reviewed earlier this admission includes: Hb-11.8, has not been  rechecked.   ## Disposition:-- There are no psychiatric contraindications to discharge at this time  ## Behavioral / Environmental: -Delirium Precautions: Delirium Interventions for Nursing and Staff: - RN to open blinds every AM. - To Bedside: Glasses, hearing aide, and pt's own shoes. Make available to patients. when possible and encourage use. - Encourage po fluids when appropriate, keep fluids within reach. - OOB to chair with meals. - Passive ROM exercises to all extremities with AM & PM care. - RN to assess orientation to person, time and place QAM and PRN. - Recommend extended visitation hours with familiar family/friends as feasible. - Staff to minimize disturbances at night. Turn off television when pt asleep or when not in use.    ## Safety and Observation Level:  - Based on my clinical evaluation, I estimate the patient to be at low risk of self harm in the current setting. - At this time, we recommend  routine observation. This decision is based on my review of the chart including patient's history and current presentation, interview of the patient, mental status examination, and consideration of suicide risk including evaluating suicidal ideation, plan, intent, suicidal or self-harm behaviors, risk factors, and protective factors. This judgment is based on our ability to directly address suicide risk, implement suicide prevention strategies, and develop a safety plan while the patient is in the clinical setting. Please contact our team if there is a concern that risk level has changed.  CSSR Risk Category:C-SSRS RISK CATEGORY: No Risk  Suicide Risk Assessment: Patient has following modifiable risk factors for suicide: under treated depression  and medication noncompliance, which we are addressing by prescribing medications.. Patient has following non-modifiable or demographic risk factors for suicide: male gender Patient has the following protective factors against suicide: Frustration  tolerance and no history of suicide attempts  Thank you for this consult request. Recommendations have been communicated to the primary team.   While future psychiatric events cannot be accurately predicted, the patient does not currently require acute inpatient psychiatric care and does not currently meet [facility] involuntary commitment criteria. Psychiatry will sign off.  Please re-consult for any future acute psychiatric concerns.    Mariel Craft, MD       History of Present Illness  Relevant Aspects of [facility] Course:  Admitted on 06/25/2023 for bilateral leg swelling due to fall. Has generally done well during the day with episodes of paranoia/agitation at night on chart review.  Now stable on Haldol decanoate.  Will add a scheduled 5 mg Haldol at HS to be given with Cogentin to prevent dystonia.   Patient Report:  Patient seen face to face in his hospital room. He is arousable, alert and oriented to place, person and situation and month and year. He is calm and cooperative during evaluation. States his mood today is ok.  He denied SI, HI, AH/VH.   Psych ROS:  Depression: Reports depressed mood and low energy level.  Anxiety: denies anxiety symptoms  Mania (lifetime and current): denies  Psychosis: (lifetime and current): patient is paranoid with disorganized speech, circumstantial and tangential thought process.   Collateral information:  Declined to give any collateral information/contact  ROS  Did not endorse somatic complaints to me.   Psychiatric and Social History  Psychiatric History:  Information collected from the patient  Prev Dx/Sx: PTSD/Paranoid schizophrenia Current [facility] Home Meds (current): Zoloft 100 mg daily, Haldol dec 100 mg q 4 weeks and Cogentin 0.5 mg at bedtime.  Previous Med Trials: unknown  Therapy: denies   Prior Psych Hospitalization:denies Prior Self Harm: denies Prior Violence: denies  Family  Psych History: denies  Family Hx suicide: denies   Social History:  Educational Hx: high school graduate Occupational Hx: Retired Engineer, agricultural. Patient states he was in [employer] for 4 years.  Legal Hx: denied  Living Situation: lives alone in a single family home Spiritual Hx: unknown Access to weapons/lethal means: denied    Substance History Alcohol: social drinker  Type of alcohol beer Last Drink weeks ago Number of drinks per day 203 bottles per week History of alcohol withdrawal seizures denied  History of DT's denied  Tobacco: smokes Black and Mild's which helps to calm his nerve and improve his attention.  Illicit drugs: denies  Prescription drug abuse: denies  Rehab hx: denies   Exam Findings  Physical Exam:   General: constricted affect.   Cardiovascular: S1-S2 normal. Respiratory: Bilateral clear. Gastrointestinal: Soft nontender. Ext: Left leg on splint and immobilized. Neuro: Alert and awake.  Mostly oriented, does not have a calendar in her room. Psychiatry- Denies any delusions or hallucinations.   Vital Signs:  Temp:  [98 F (36.7 C)-98.4 F (36.9 C)] 98 F (36.7 C) (02/19 2042) Pulse Rate:  [75-82] 82 (02/20 0803) Resp:  [17-18] 18 (02/20 0803) BP: (115-118)/(72-78) 115/78 (02/20 0803) SpO2:  [96 %-99 %] 96 % (02/20 0803) Blood pressure 115/78, pulse 82, temperature 98 F (36.7 C), temperature source Oral, resp. rate 18, height 6\' 2"  (1.88 m), weight 123.8 kg, SpO2 96%. Body mass index is 35.05 kg/m.  Physical Exam Musculoskeletal:     Comments: No termor, rigidity, cogwheeling b/l u/e. Full ROM at elbow.     Mental Status Exam: General Appearance: Casual  Orientation:  Full (Time, Place, and Person)  Memory:  Recent;   Fair  Concentration:  Concentration: Good  Recall:  Fair  Attention  Fair  Eye Contact:  Fair  Speech:   fluent, normal volume  Language:  Good  Volume:  Normal  Mood: "ok"  Affect:  Appropriate and Full  Range  Thought Process:  Coherent and Linear  Thought Content:  Logical  Suicidal Thoughts:  No  Homicidal Thoughts:  No  Judgement:  Fair  Insight:  Fair  Psychomotor Activity:  Normal  Akathisia:  No  Fund of Knowledge:  Fair      Assets:  Manufacturing systems engineer Desire for Improvement Financial Resources/Insurance Resilience Social Support  Cognition:  WNL  ADL's:  Impaired  AIMS (if indicated):    negative     Other History   These have been pulled in through the EMR, reviewed, and updated if appropriate.  Family History:  The patient's Family history is unknown by patient.  Medical History: Past Medical History:  Diagnosis Date   Behavioral disorder    Cancer [facility])    prostate cancer & cancer in his spine   Chronic back pain    Constipation  Decreased mobility    uses wheelchair, spinal cancer   Gonorrhea    Impaired mobility    prostate cancer cancer in his spine led to surgery and some decrease use of his lower extremities, neurogenic bladder   Intermittent self-catheterization of bladder    Neurogenic bladder    Paranoid schizophrenia (HCC)    PTSD (post-traumatic stress disorder)     Surgical History: Past Surgical History:  Procedure Laterality Date   BACK SURGERY     ORIF ANKLE FRACTURE Left 07/01/2023   Procedure: OPEN REDUCTION INTERNAL FIXATION (ORIF) ANKLE FRACTURE;  Surgeon: Roby Lofts, MD;  Location: [facility] OR;  Service: Orthopedics;  Laterality: Left;     Medications:   Current Facility-Administered Medications:    acetaminophen (TYLENOL) tablet 650 mg, 650 mg, Oral, Q6H, 650 mg at 07/09/23 0806 **OR** acetaminophen (TYLENOL) suppository 650 mg, 650 mg, Rectal, Q6H, McClung, Sarah A, PA-C   alum & mag hydroxide-simeth (MAALOX/MYLANTA) 200-200-20 MG/5ML suspension 30 mL, 30 mL, Oral, Q4H PRN, McClung, Sarah A, PA-C   atorvastatin (LIPITOR) tablet 80 mg, 80 mg, Oral, Daily, Dorcas Carrow, MD, 80 mg at 07/09/23 4098   benztropine (COGENTIN)  tablet 0.5 mg, 0.5 mg, Oral, Daily, Dorcas Carrow, MD, 0.5 mg at 07/09/23 0806   cholecalciferol (VITAMIN D3) 25 MCG (1000 UNIT) tablet 1,000 Units, 1,000 Units, Oral, Daily, West Bali, PA-C, 1,000 Units at 07/09/23 1191   cyclobenzaprine (FLEXERIL) tablet 10 mg, 10 mg, Oral, BID PRN, [place] Bali, PA-C, 10 mg at 07/08/23 2040   docusate sodium (COLACE) capsule 100 mg, 100 mg, Oral, BID, [place] Bali, PA-C, 100 mg at 07/08/23 2039   enoxaparin (LOVENOX) injection 40 mg, 40 mg, Subcutaneous, Q24H, [place] Bali, PA-C, 40 mg at 07/09/23 4782   gabapentin (NEURONTIN) capsule 300 mg, 300 mg, Oral, TID, Dorcas Carrow, MD, 300 mg at 07/09/23 9562   haloperidol (HALDOL) tablet 5 mg, 5 mg, Oral, BID PRN **OR** haloperidol lactate (HALDOL) injection 2 mg, 2 mg, Intramuscular, Q6H PRN, Cinderella, Margaret A, 2 mg at 07/09/23 0036   labetalol (NORMODYNE) injection 10 mg, 10 mg, Intravenous, Q4H PRN, McClung, Sarah A, PA-C   melatonin tablet 10 mg, 10 mg, Oral, QHS PRN, [place] Bali, PA-C, 10 mg at 07/08/23 2039   metoCLOPramide (REGLAN) tablet 5-10 mg, 5-10 mg, Oral, Q8H PRN **OR** metoCLOPramide (REGLAN) injection 5-10 mg, 5-10 mg, Intravenous, Q8H PRN, McClung, Sarah A, PA-C   ondansetron (ZOFRAN) tablet 4 mg, 4 mg, Oral, Q6H PRN **OR** ondansetron (ZOFRAN) injection 4 mg, 4 mg, Intravenous, Q6H PRN, McClung, Sarah A, PA-C   oxyCODONE (Oxy IR/ROXICODONE) immediate release tablet 10-15 mg, 10-15 mg, Oral, Q4H PRN, [place] Bali, PA-C, 10 mg at 07/08/23 2040   polyethylene glycol (MIRALAX / GLYCOLAX) packet 17 g, 17 g, Oral, Daily PRN, Thyra Breed A, PA-C, 17 g at 07/05/23 0700   polyvinyl alcohol (LIQUIFILM TEARS) 1.4 % ophthalmic solution 1 drop, 1 drop, Both Eyes, Q1H PRN, Dorcas Carrow, MD, 1 drop at 07/07/23 1400   sertraline (ZOLOFT) tablet 100 mg, 100 mg, Oral, Daily, Ghimire, Kuber, MD, 100 mg at 07/09/23 0806   tamsulosin (FLOMAX) capsule 0.4 mg, 0.4 mg, Oral, Daily,  [place] Bali, PA-C, 0.4 mg at 07/09/23 1308  Allergies: Allergies  Allergen Reactions   Penicillins Anaphylaxis    Has patient had a PCN reaction causing immediate rash, facial/tongue/throat swelling, SOB or lightheadedness with hypotension: Yes Has patient had a PCN reaction causing severe rash involving mucus membranes  or skin necrosis: No Has patient had a PCN reaction that required hospitalization: Yes Has patient had a PCN reaction occurring within the last 10 years: No If all of the above answers are "NO", then may proceed with Cephalosporin use.    Tamsulosin Other (See Comments)    Weakness, fatigue    Velna Hatchet Sherilyn Banker, MD

## 2023-07-10 DIAGNOSIS — E66812 Obesity, class 2: Secondary | ICD-10-CM | POA: Diagnosis not present

## 2023-07-10 DIAGNOSIS — F209 Schizophrenia, unspecified: Secondary | ICD-10-CM | POA: Diagnosis not present

## 2023-07-10 DIAGNOSIS — S82852A Displaced trimalleolar fracture of left lower leg, initial encounter for closed fracture: Secondary | ICD-10-CM | POA: Diagnosis not present

## 2023-07-10 DIAGNOSIS — I10 Essential (primary) hypertension: Secondary | ICD-10-CM | POA: Diagnosis not present

## 2023-07-10 MED ORDER — HALOPERIDOL DECANOATE 100 MG/ML IM SOLN: 100.0000 mg | INTRAMUSCULAR | Status: DC

## 2023-07-10 NOTE — TOC Progression Note (Signed)
Transition of Care [facility]) - Progression Note    Patient Details  Name: Timothy [name] MRN: [id number] Date of Birth: 07/18/1955  Transition of Care [facility]) CM/SW Contact  Lorri Frederick, LCSW Phone Number: 07/10/2023, 11:38 AM  Clinical Narrative:    CSW notes [employer] is no longer appearing as insurance on pt face sheet.  Email from [facility] [place]. SNF authorization is still in process but she anticipates it may be "into next week" before a decision is made.  She recommends making the referrals to [facility] SNFs from the list.   Referral sent in hub to [facility] and [facility].   Expected Discharge Plan: Skilled Nursing Facility Barriers to Discharge: Continued Medical Work up, English as a second language teacher, SNF Pending bed offer  Expected Discharge Plan and Services In-house Referral: Clinical Social Work   Post Acute Care Choice: Skilled Nursing Facility Living arrangements for the past 2 months: Single Family Home                                       Social Determinants of Health (SDOH) Interventions SDOH Screenings   Food Insecurity: Patient Declined (06/26/2023)  Housing: Patient Declined (06/26/2023)  Transportation Needs: Patient Declined (06/26/2023)  Utilities: Patient Declined (06/26/2023)  Social Connections: Unknown (06/26/2023)  Tobacco Use: Medium Risk (07/01/2023)    Readmission Risk Interventions     No data to display

## 2023-07-10 NOTE — Progress Notes (Signed)
Mobility Specialist Progress Note:    07/10/23 1100  Mobility  Activity Transferred from bed to chair  Level of Assistance +2 (takes two people)  Press photographer wheel walker  Distance Ambulated (ft) 2 ft  LLE Weight Bearing Per Provider Order NWB  Activity Response Tolerated well  Mobility Referral Yes (+2)  Mobility visit 1 Mobility  Mobility Specialist Start Time (ACUTE ONLY) 1101  Mobility Specialist Stop Time (ACUTE ONLY) 1115  Mobility Specialist Time Calculation (min) (ACUTE ONLY) 14 min   Pt received in bed and agreeable. Able to some EOB w/ minA. Required modA+2 to stand and pivot to chair. Required verbal cues throughout to follow LLE NWB precautions. Pt left in chair w/ chair alarm on and NT present.  D'Vante Earlene Plater Mobility Specialist Please contact via Special educational needs teacher or Rehab office at [phone]

## 2023-07-10 NOTE — Assessment & Plan Note (Signed)
Estimated body mass index is 35.05 kg/m as calculated from the following:   Height as of this encounter: 6\' 2"  (1.88 m).   Weight as of this encounter: 123.8 kg.

## 2023-07-10 NOTE — Subjective & Objective (Addendum)
 Pt seen and examined.  Stable. Pt awake this AM. Pt finished his entire breakfast tray.  Still with poor insight into his housing arrangements. He knows that he is in the hospital, the current year and month. Still doesn't realize that he is unable to walk independently and will need SNF at discharge. Pt is calm.

## 2023-07-10 NOTE — Progress Notes (Signed)
PROGRESS NOTE    Timothy [name]  ZOX:[id number] DOB: 09/17/1955 DOA: 06/25/2023 PCP: Wendi Maya, MD  Subjective: Pt seen and examined. I admitted him to the hospital 2 weeks ago. He is now s/p ORIF of his left ankle fracture 07-01-2023. He has been seen by psychiatry due to acute exacerbation of his schizophrenia.  Pt still asking to go home. Does not realize he cannot walk.  Psych still actively involved with pt's care. Pt is medically stable for DC. Awaiting for his psychiatric symptoms to improve.  Not sure where "wheelchair bound" got entered into his chart, but pt is NOT wheelchair bound.  Pt uses a walker or wheelchair at home prior to admission.  Pt walked 2 feet with aid of walker with mobility specialist yesterday.   Hospital Course: HPI: 68 year old African-American male history of schizophrenia, chronic neurogenic bladder with a history of self catheterizations, chronic fecal incontinence, history of chronic back pain, follows almost exclusively at the [facility] [place] presents to the ER today for bilateral leg swelling.  He was seen in the ER at the end of January.  He was given a course of Keflex which he never picked up.  Patient was noted to have a deformed  left lower leg.  X-rays showed a left trimalleolar fracture.  This was reduced after conscious sedation by the ER.  Orthopedics Dr. Hulda Humphrey consulted by the EDP.  [facility] asked to admit the patient.  Patient states that he does not recall how he injured his leg.  He states that he has been using his leg a lot.  He is unclear about their circumstances on how he injured his leg.  He does know that is February 2025.  He knows that he is in the hospital.  On arrival temp 98.1 heart rate 103 blood pressure 90/68.  White count 6.0, hemoglobin 14.2, platelets 189  Sodium 139, potassium 3.6, chloride 103, bicarb 24, BUN of 10, creatinine 1.02, glucose 104  Calcium 8.5, total protein 6.4, albumin 3.0  AST 39,  ALT 24, alk phos 53, total 0.8  Lactic acid normal at 1.1  Urine drug screen negative Ethyl alcohol less than 10  Cath UA showed positive nitrates, leukocyte esterase, bacteria many  BNP normal at 66.8  Significant Events: Admitted 06/25/2023 for left trimalleolar fracture 07/01/2023, underwent ORIF of left ankle 07/03/2023, started developing paranoia.  Apparently also APS involved on family.  07/07/2023, improving.  Insist on going home however he cannot go home. 07/08/2023, paranoid.  Loss of insight.  Followed by psych.  Significant Labs: Sodium 139, potassium 3.6, chloride 103, bicarb 24, BUN of 10, creatinine 1.02, glucose 104 Calcium 8.5, total protein 6.4, albumin 3.0 AST 39, ALT 24, alk phos 53, total 0.8 Lactic acid normal at 1.1 Urine drug screen negative Ethyl alcohol less than 10 Cath UA showed positive nitrates, leukocyte esterase, bacteria many BNP normal at 66.8  Significant Imaging Studies: Left ankle XR Acute mildly displaced distal fibular shaft fracture. Acute mildly displaced medial malleolar fracture. Mild posterior subluxation of the talar dome with respect to the distal tibia. CXR No active cardiopulmonary disease. Question nodular opacity at the posterior lung base on 1 of the 2 lateral images. Consider further assessment with chest CT CT head No acute intracranial process. 2. Paranasal sinus disease.  Antibiotic Therapy: Anti-infectives (From admission, onward)    Start     Dose/Rate Route Frequency Ordered Stop   06/25/23 1630  cefTRIAXone (ROCEPHIN) 1 g in sodium chloride 0.9 %  100 mL IVPB        1 g 200 mL/hr over 30 Minutes Intravenous  Once 06/25/23 1621 06/25/23 1827       Procedures: 06-25-2023 left ankle fracture reduction 07-01-2023 ORIF left ankle fracture  Consultants: Amelia Jo psych    Assessment and Plan: * Trimalleolar fracture of ankle, closed, left, initial encounter 06-25-2023. Admit to obs med/tele bed.  Left ankle has  already been reduced by the ER.  Placed in a posterior splint.  Orthopedics Dr. Hulda Humphrey consulted.  N.p.o. after midnight in case he needs operative intervention.  P.o. Percocet for pain as needed.  06-26-2023 through 07-09-2023  S/p ORIF -2/12 Dr. Jena Gauss. Secondary to fall 5 weeks ago. Pain regimen: Tylenol as needed, oxycodone as needed.  Bowel regimen: MiraLAX as needed.  DVT prophylaxis with Lovenox in the hospital.  Aspirin on discharge.  07-10-2023 pt unable to walk with PT.  Will need SNF   Schizophrenia [facility]) 06-25-2023 Patient has a history of schizophrenia per the EMR.  He gets all of his care at the [facility].  Will need the pharmacy technician to perform a medicine reconciliation after speaking with the [facility] pharmacy tomorrow.  06-26-2023 through 07-09-2023  psych consulted on 07-05-2023 due to increasingly disorganized speech, circumstantial and tangential thought process. Pt received IM haldol deconate 50 mg on 07-03-2023 and 07-06-2023. Pt is suppose to be on 100 mg IM qmonthly.    07-10-2023 Next 100 mg IM haldol deconate dose on 08-03-2023. Will go ahead and schedule this dose in case he is still in the hospital next month.  DNR (do not resuscitate)/DNI(Do not intubate) 06-25-2023 Discussed CODE STATUS with the patient.  He is in my opinion medically competent to make decisions.  He is alert and oriented x 3.  He has electively made himself DNR/DNI.  Fecal incontinence 06-25-2023 Patient describes chronic fecal incontinence.  He relates this to his injury he sustained while he was in the [employer].  07-10-2023 chronic.  Essential (primary) hypertension 06-25-2023 Stable for now.  Unclear what his blood pressure medicines are.  07-10-2023 BP controlled on no scheduled HTN meds.  Neurogenic bladder 06-25-2023 Patient has a history of neurogenic bladder.  He uses self-catheterization to empty his bladder.  07-10-2023 stable. Has external urinary catheter.   Self-catheterizes urinary bladder 06-25-2023  Patient chronically self catheterizes every 3-4 hours.  He is urinating normally now.  I think his bladder is likely chronically contaminated and colonized with bacteria.  He has no systemic signs of infection.  Will hold on any further antibiotic therapy for now.  07-10-2023 stable. Has external urinary catheter.  Obesity, Class II, BMI 35-39.9 Estimated body mass index is 35.05 kg/m as calculated from the following:   Height as of this encounter: 6\' 2"  (1.88 m).   Weight as of this encounter: 123.8 kg.        DVT prophylaxis: enoxaparin (LOVENOX) injection 40 mg Start: 07/02/23 0800 SCDs Start: 07/01/23 1324 SCDs Start: 06/25/23 2303    Code Status: Limited: Do not attempt resuscitation (DNR) -DNR-LIMITED -Do Not Intubate/DNI  Family Communication: no family at bedside Disposition Plan: TOC attempting to place patient into [place] approved SNF Reason for continuing need for hospitalization: medically stable for DC. No psych contraindications to discharge.  Objective: Vitals:   07/09/23 1639 07/09/23 2000 07/10/23 0500 07/10/23 0820  BP: 118/74 124/80 112/75 115/75  Pulse: 74 81 74 81  Resp: 18 16 18 16   Temp: 98.1 F (36.7 C) (!) 97.5  F (36.4 C) (!) 97.3 F (36.3 C) 97.7 F (36.5 C)  TempSrc: Oral Oral Oral Oral  SpO2: 98% 98% 95% 97%  Weight:      Height:        Intake/Output Summary (Last 24 hours) at 07/10/2023 1157 Last data filed at 07/09/2023 1639 Gross per 24 hour  Intake --  Output 600 ml  Net -600 ml   Filed Weights   06/25/23 1430 07/01/23 0936  Weight: 124 kg 123.8 kg    Examination:  Physical Exam Vitals and nursing note reviewed.  Constitutional:      General: He is not in acute distress.    Appearance: He is obese.     Comments: Chronically ill appearing  HENT:     Head: Normocephalic and atraumatic.     Nose: Nose normal.  Eyes:     General: No scleral icterus. Cardiovascular:     Rate and Rhythm: Normal rate and regular rhythm.   Pulmonary:     Effort: Pulmonary effort is normal.  Abdominal:     General: Abdomen is protuberant. Bowel sounds are normal. There is no distension.     Palpations: Abdomen is soft.  Musculoskeletal:     Comments: Left lower leg wrapped in bulky dressing and ACE bandage  Skin:    Capillary Refill: Capillary refill takes less than 2 seconds.  Neurological:     General: No focal deficit present.     Mental Status: He is alert and oriented to person, place, and time.     Comments: Has poor insight into his condition.     Data Reviewed: I have personally reviewed following labs and imaging studies   Basic Metabolic Panel: Recent Labs  Lab 07/08/23 0543  CREATININE 0.92   GFR: Estimated Creatinine Clearance: 108.9 mL/min (by C-G formula based on SCr of 0.92 mg/dL).  BNP (last 3 results) Recent Labs    01/19/23 2308 06/25/23 1441  BNP 15.6 66.8    Recent Results (from the past 240 hours)  Surgical pcr screen     Status: None   Collection Time: 07/01/23  4:21 AM   Specimen: Nasal Mucosa; Nasal Swab  Result Value Ref Range Status   MRSA, PCR NEGATIVE NEGATIVE Final   Staphylococcus aureus NEGATIVE NEGATIVE Final    Comment: (NOTE) The Xpert SA Assay (FDA approved for NASAL specimens in patients 58 years of age and older), is one component of a comprehensive surveillance program. It is not intended to diagnose infection nor to guide or monitor treatment. Performed at [facility], [address], [place], [place] [address]      Scheduled Meds:  acetaminophen  650 mg Oral Q6H   Or   acetaminophen  650 mg Rectal Q6H   atorvastatin  80 mg Oral Daily   benztropine  0.5 mg Oral Daily   cholecalciferol  1,000 Units Oral Daily   docusate sodium  100 mg Oral BID   enoxaparin (LOVENOX) injection  40 mg Subcutaneous Q24H   gabapentin  300 mg Oral TID   haloperidol  5 mg Oral QHS   [START ON 08/03/2023] haloperidol decanoate  100 mg Intramuscular Q30 days    sertraline  100 mg Oral Daily   tamsulosin  0.4 mg Oral Daily   Continuous Infusions:   LOS: 13 days   Time spent: 40 minutes  Carollee Herter, DO  [facility]  07/10/2023, 11:57 AM

## 2023-07-11 DIAGNOSIS — I10 Essential (primary) hypertension: Secondary | ICD-10-CM | POA: Diagnosis not present

## 2023-07-11 DIAGNOSIS — E66812 Obesity, class 2: Secondary | ICD-10-CM | POA: Diagnosis not present

## 2023-07-11 DIAGNOSIS — S82852A Displaced trimalleolar fracture of left lower leg, initial encounter for closed fracture: Secondary | ICD-10-CM | POA: Diagnosis not present

## 2023-07-11 DIAGNOSIS — F209 Schizophrenia, unspecified: Secondary | ICD-10-CM | POA: Diagnosis not present

## 2023-07-11 NOTE — Plan of Care (Signed)

## 2023-07-11 NOTE — Plan of Care (Signed)
 Patient able to answer all orientation questions, however, seems intermittently confused with different conversations. He refused all medications this morning. Patient encouraged to take his medicine along with the benefits. Patient states "I'm already healed". No complaints of pain or SOB.   Problem: Clinical Measurements: Goal: Ability to maintain clinical measurements within normal limits will improve Outcome: Progressing   Problem: Nutrition: Goal: Adequate nutrition will be maintained Outcome: Progressing   Problem: Coping: Goal: Level of anxiety will decrease Outcome: Progressing   Problem: Education: Goal: Knowledge of General Education information will improve Description: Including pain rating scale, medication(s)/side effects and non-pharmacologic comfort measures Outcome: Not Progressing   Problem: Health Behavior/Discharge Planning: Goal: Ability to manage health-related needs will improve Outcome: Not Progressing

## 2023-07-11 NOTE — Progress Notes (Signed)
 PROGRESS NOTE    Timothy [name]  EAV:[id number] DOB: 05/24/1955 DOA: 06/25/2023 PCP: Wendi Maya, MD  Subjective: Pt seen and examined.  Stable. Pt awake this AM. Aware that he is not wearing any underwear and that his penis is showing.  Still stating he can walk and wants to go home in [facility]   Hospital Course: HPI: 68 year old African-American male history of schizophrenia, chronic neurogenic bladder with a history of self catheterizations, chronic fecal incontinence, history of chronic back pain, follows almost exclusively at the [facility] [place] presents to the ER today for bilateral leg swelling.  He was seen in the ER at the end of January.  He was given a course of Keflex which he never picked up.  Patient was noted to have a deformed  left lower leg.  X-rays showed a left trimalleolar fracture.  This was reduced after conscious sedation by the ER.  Orthopedics Dr. Hulda Humphrey consulted by the EDP.  [facility] asked to admit the patient.  Patient states that he does not recall how he injured his leg.  He states that he has been using his leg a lot.  He is unclear about their circumstances on how he injured his leg.  He does know that is February 2025.  He knows that he is in the hospital.  On arrival temp 98.1 heart rate 103 blood pressure 90/68.  White count 6.0, hemoglobin 14.2, platelets 189  Sodium 139, potassium 3.6, chloride 103, bicarb 24, BUN of 10, creatinine 1.02, glucose 104  Calcium 8.5, total protein 6.4, albumin 3.0  AST 39, ALT 24, alk phos 53, total 0.8  Lactic acid normal at 1.1  Urine drug screen negative Ethyl alcohol less than 10  Cath UA showed positive nitrates, leukocyte esterase, bacteria many  BNP normal at 66.8  Significant Events: Admitted 06/25/2023 for left trimalleolar fracture 07/01/2023, underwent ORIF of left ankle 07/03/2023, started developing paranoia.  Apparently also APS involved on family.  07/07/2023, improving.  Insist  on going home however he cannot go home. 07/08/2023, paranoid.  Loss of insight.  Followed by psych.  Significant Labs: Sodium 139, potassium 3.6, chloride 103, bicarb 24, BUN of 10, creatinine 1.02, glucose 104 Calcium 8.5, total protein 6.4, albumin 3.0 AST 39, ALT 24, alk phos 53, total 0.8 Lactic acid normal at 1.1 Urine drug screen negative Ethyl alcohol less than 10 Cath UA showed positive nitrates, leukocyte esterase, bacteria many BNP normal at 66.8  Significant Imaging Studies: Left ankle XR Acute mildly displaced distal fibular shaft fracture. Acute mildly displaced medial malleolar fracture. Mild posterior subluxation of the talar dome with respect to the distal tibia. CXR No active cardiopulmonary disease. Question nodular opacity at the posterior lung base on 1 of the 2 lateral images. Consider further assessment with chest CT CT head No acute intracranial process. 2. Paranasal sinus disease.  Antibiotic Therapy: Anti-infectives (From admission, onward)    Start     Dose/Rate Route Frequency Ordered Stop   06/25/23 1630  cefTRIAXone (ROCEPHIN) 1 g in sodium chloride 0.9 % 100 mL IVPB        1 g 200 mL/hr over 30 Minutes Intravenous  Once 06/25/23 1621 06/25/23 1827       Procedures: 06-25-2023 left ankle fracture reduction 07-01-2023 ORIF left ankle fracture  Consultants: Amelia Jo psych    Assessment and Plan: * Trimalleolar fracture of ankle, closed, left, initial encounter 06-25-2023. Admit to obs med/tele bed.  Left ankle has already been  reduced by the ER.  Placed in a posterior splint.  Orthopedics Dr. Hulda Humphrey consulted.  N.p.o. after midnight in case he needs operative intervention.  P.o. Percocet for pain as needed.  06-26-2023 through 07-09-2023  S/p ORIF -2/12 Dr. Jena Gauss. Secondary to fall 5 weeks ago. Pain regimen: Tylenol as needed, oxycodone as needed.  Bowel regimen: MiraLAX as needed.  DVT prophylaxis with Lovenox in the hospital.  Aspirin on  discharge. 07-10-2023 pt unable to walk with PT.  Will need SNF  07-11-2023 awaiting SNF placement.   Schizophrenia (HCC) 06-25-2023 Patient has a history of schizophrenia per the EMR.  He gets all of his care at the [facility].  Will need the pharmacy technician to perform a medicine reconciliation after speaking with the [facility] pharmacy tomorrow. 06-26-2023 through 07-09-2023  psych consulted on 07-05-2023 due to increasingly disorganized speech, circumstantial and tangential thought process. Pt received IM haldol deconate 50 mg on 07-03-2023 and 07-06-2023. Pt is suppose to be on 100 mg IM qmonthly.   07-10-2023 Next 100 mg IM haldol deconate dose on 08-03-2023. Will go ahead and schedule this dose in case he is still in the hospital next month.  07-11-2023 stable. No psych contraindications to discharge.  DNR (do not resuscitate)/DNI(Do not intubate) 06-25-2023 Discussed CODE STATUS with the patient.  He is in my opinion medically competent to make decisions.  He is alert and oriented x 3.  He has electively made himself DNR/DNI.  Fecal incontinence 06-25-2023 Patient describes chronic fecal incontinence.  He relates this to his injury he sustained while he was in the [employer].  07-10-2023 chronic.  07-11-2023 stable.  Essential (primary) hypertension 06-25-2023 Stable for now.  Unclear what his blood pressure medicines are.  07-10-2023 BP controlled on no scheduled HTN meds.  07-11-2023 stable.  Neurogenic bladder 06-25-2023 Patient has a history of neurogenic bladder.  He uses self-catheterization to empty his bladder.  07-10-2023 stable. Has external urinary catheter.   07-11-2023 stable.  Self-catheterizes urinary bladder 06-25-2023 Patient chronically self catheterizes every 3-4 hours.  He is urinating normally now.  I think his bladder is likely chronically contaminated and colonized with bacteria.  He has no systemic signs of infection.  Will hold on any further antibiotic therapy for  now.  07-10-2023 stable. Has external urinary catheter.  07-11-2023 stable.  Obesity, Class II, BMI 35-39.9 Estimated body mass index is 35.05 kg/m as calculated from the following:   Height as of this encounter: 6\' 2"  (1.88 m).   Weight as of this encounter: 123.8 kg.        DVT prophylaxis: enoxaparin (LOVENOX) injection 40 mg Start: 07/02/23 0800 SCDs Start: 07/01/23 1324 SCDs Start: 06/25/23 2303    Code Status: Limited: Do not attempt resuscitation (DNR) -DNR-LIMITED -Do Not Intubate/DNI  Family Communication: no family at bedside Disposition Plan: TOC working on getting [facility]. Reason for continuing need for hospitalization: medically stable for DC  Objective: Vitals:   07/10/23 0820 07/10/23 1459 07/10/23 2026 07/11/23 0500  BP: 115/75 126/68 (!) 140/80 114/76  Pulse: 81 100 77 76  Resp: 16 16 18 18   Temp: 97.7 F (36.5 C) 99 F (37.2 C) 97.6 F (36.4 C) 97.6 F (36.4 C)  TempSrc: Oral Oral Oral Oral  SpO2: 97% 98% 100% 99%  Weight:      Height:        Intake/Output Summary (Last 24 hours) at 07/11/2023 1020 Last data filed at 07/11/2023 0909 Gross per 24  hour  Intake 600 ml  Output 3100 ml  Net -2500 ml   Filed Weights   06/25/23 1430 07/01/23 0936  Weight: 124 kg 123.8 kg    Examination:  Physical Exam Vitals and nursing note reviewed.  Constitutional:      General: He is not in acute distress.    Appearance: He is obese. He is not toxic-appearing.  HENT:     Head: Normocephalic and atraumatic.     Nose: Nose normal.  Eyes:     General: No scleral icterus. Cardiovascular:     Rate and Rhythm: Normal rate and regular rhythm.  Pulmonary:     Effort: Pulmonary effort is normal.     Breath sounds: Normal breath sounds.  Abdominal:     General: Bowel sounds are normal. There is no distension.     Palpations: Abdomen is soft.  Musculoskeletal:     Right lower leg: No edema.     Comments: Left lower leg wrapped in ACE  bandage and bulky dressing  Skin:    Capillary Refill: Capillary refill takes less than 2 seconds.  Neurological:     Mental Status: He is alert.     Comments: Aware he is in the hospital. Aware of date. Still does not recognize that he is weak and cannot walk without major assistance,.  Psychiatric:     Comments: Poor insight into his current physical disabilities.    Data Reviewed: I have personally reviewed following labs and imaging studies  Basic Metabolic Panel: Recent Labs  Lab 07/08/23 0543  CREATININE 0.92   GFR: Estimated Creatinine Clearance: 108.9 mL/min (by C-G formula based on SCr of 0.92 mg/dL).  BNP (last 3 results) Recent Labs    01/19/23 2308 06/25/23 1441  BNP 15.6 66.8   Scheduled Meds:  acetaminophen  650 mg Oral Q6H   Or   acetaminophen  650 mg Rectal Q6H   atorvastatin  80 mg Oral Daily   benztropine  0.5 mg Oral Daily   cholecalciferol  1,000 Units Oral Daily   docusate sodium  100 mg Oral BID   enoxaparin (LOVENOX) injection  40 mg Subcutaneous Q24H   gabapentin  300 mg Oral TID   haloperidol  5 mg Oral QHS   [START ON 08/03/2023] haloperidol decanoate  100 mg Intramuscular Q30 days   sertraline  100 mg Oral Daily   tamsulosin  0.4 mg Oral Daily   Continuous Infusions:   LOS: 14 days   Time spent: 40 minutes  Carollee Herter, DO  [facility]  07/11/2023, 10:20 AM

## 2023-07-12 DIAGNOSIS — Z789 Other specified health status: Secondary | ICD-10-CM | POA: Diagnosis not present

## 2023-07-12 DIAGNOSIS — S82852A Displaced trimalleolar fracture of left lower leg, initial encounter for closed fracture: Secondary | ICD-10-CM | POA: Diagnosis not present

## 2023-07-12 DIAGNOSIS — I10 Essential (primary) hypertension: Secondary | ICD-10-CM | POA: Diagnosis not present

## 2023-07-12 DIAGNOSIS — F209 Schizophrenia, unspecified: Secondary | ICD-10-CM | POA: Diagnosis not present

## 2023-07-12 NOTE — Progress Notes (Signed)
 PROGRESS NOTE    Timothy [name]  QMV:[id number] DOB: June 05, 1955 DOA: 06/25/2023 PCP: Wendi Maya, MD  Subjective: Pt seen and examined.  Stable. Pt awake this AM. Pt finished his entire breakfast tray.  Still with poor insight into his housing arrangements. He knows that he is in the hospital, the current year and month. Still doesn't realize that he is unable to walk independently and will need SNF at discharge. Pt is calm.   Hospital Course: HPI: 68 year old African-American male history of schizophrenia, chronic neurogenic bladder with a history of self catheterizations, chronic fecal incontinence, history of chronic back pain, follows almost exclusively at the [facility] [place] presents to the ER today for bilateral leg swelling.  He was seen in the ER at the end of January.  He was given a course of Keflex which he never picked up.  Patient was noted to have a deformed  left lower leg.  X-rays showed a left trimalleolar fracture.  This was reduced after conscious sedation by the ER.  Orthopedics Dr. Hulda Humphrey consulted by the EDP.  [facility] asked to admit the patient.  Patient states that he does not recall how he injured his leg.  He states that he has been using his leg a lot.  He is unclear about their circumstances on how he injured his leg.  He does know that is February 2025.  He knows that he is in the hospital.  On arrival temp 98.1 heart rate 103 blood pressure 90/68.  White count 6.0, hemoglobin 14.2, platelets 189  Sodium 139, potassium 3.6, chloride 103, bicarb 24, BUN of 10, creatinine 1.02, glucose 104  Calcium 8.5, total protein 6.4, albumin 3.0  AST 39, ALT 24, alk phos 53, total 0.8  Lactic acid normal at 1.1  Urine drug screen negative Ethyl alcohol less than 10  Cath UA showed positive nitrates, leukocyte esterase, bacteria many  BNP normal at 66.8  Significant Events: Admitted 06/25/2023 for left trimalleolar fracture 07/01/2023, underwent  ORIF of left ankle 07/03/2023, started developing paranoia.  Apparently also APS involved on family.  07/07/2023, improving.  Insist on going home however he cannot go home. 07/08/2023, paranoid.  Loss of insight.  Followed by psych.  Significant Labs: Sodium 139, potassium 3.6, chloride 103, bicarb 24, BUN of 10, creatinine 1.02, glucose 104 Calcium 8.5, total protein 6.4, albumin 3.0 AST 39, ALT 24, alk phos 53, total 0.8 Lactic acid normal at 1.1 Urine drug screen negative Ethyl alcohol less than 10 Cath UA showed positive nitrates, leukocyte esterase, bacteria many BNP normal at 66.8  Significant Imaging Studies: Left ankle XR Acute mildly displaced distal fibular shaft fracture. Acute mildly displaced medial malleolar fracture. Mild posterior subluxation of the talar dome with respect to the distal tibia. CXR No active cardiopulmonary disease. Question nodular opacity at the posterior lung base on 1 of the 2 lateral images. Consider further assessment with chest CT CT head No acute intracranial process. 2. Paranasal sinus disease.  Antibiotic Therapy: Anti-infectives (From admission, onward)    Start     Dose/Rate Route Frequency Ordered Stop   06/25/23 1630  cefTRIAXone (ROCEPHIN) 1 g in sodium chloride 0.9 % 100 mL IVPB        1 g 200 mL/hr over 30 Minutes Intravenous  Once 06/25/23 1621 06/25/23 1827       Procedures: 06-25-2023 left ankle fracture reduction 07-01-2023 ORIF left ankle fracture  Consultants: Ortho Hulda Humphrey psych    Assessment and Plan: * Trimalleolar  fracture of ankle, closed, left, initial encounter 06-25-2023. Admit to obs med/tele bed.  Left ankle has already been reduced by the ER.  Placed in a posterior splint.  Orthopedics Dr. Hulda Humphrey consulted.  N.p.o. after midnight in case he needs operative intervention.  P.o. Percocet for pain as needed. 06-26-2023 through 07-09-2023  S/p ORIF -2/12 Dr. Jena Gauss. Secondary to fall 5 weeks ago. Pain regimen: Tylenol as  needed, oxycodone as needed.  Bowel regimen: MiraLAX as needed.  DVT prophylaxis with Lovenox in the hospital.  Aspirin on discharge. 07-10-2023 pt unable to walk with PT.  Will need SNF 07-11-2023 awaiting SNF placement.  07-12-2023 awaiting TOC to arrange [facility]-approved SNF placement.   Schizophrenia (HCC) 06-25-2023 Patient has a history of schizophrenia per the EMR.  He gets all of his care at the [facility].  Will need the pharmacy technician to perform a medicine reconciliation after speaking with the [facility] pharmacy tomorrow. 06-26-2023 through 07-09-2023  psych consulted on 07-05-2023 due to increasingly disorganized speech, circumstantial and tangential thought process. Pt received IM haldol deconate 50 mg on 07-03-2023 and 07-06-2023. Pt is suppose to be on 100 mg IM qmonthly.   07-10-2023 Next 100 mg IM haldol deconate dose on 08-03-2023. Will go ahead and schedule this dose in case he is still in the hospital next month. 07-11-2023 stable. No psych contraindications to discharge.  07-12-2023 stable for DC per psych. IM dose 100 mg of Haldol deconate ordered for 08-03-2023.  DNR (do not resuscitate)/DNI(Do not intubate) 06-25-2023 Discussed CODE STATUS with the patient.  He is in my opinion medically competent to make decisions.  He is alert and oriented x 3.  He has electively made himself DNR/DNI.  Fecal incontinence 06-25-2023 Patient describes chronic fecal incontinence.  He relates this to his injury he sustained while he was in the [employer]. 07-10-2023 chronic. 07-11-2023 stable.  07-12-2023 stable  Essential (primary) hypertension 06-25-2023 Stable for now.  Unclear what his blood pressure medicines are. 07-10-2023 BP controlled on no scheduled HTN meds. 07-11-2023 stable.  07-12-2023 stable on no scheduled HTN meds.  Neurogenic bladder 06-25-2023 Patient has a history of neurogenic bladder.  He uses self-catheterization to empty his bladder. 07-10-2023 stable. Has external urinary catheter.   07-11-2023 stable.  07-12-2023 stable   Self-catheterizes urinary bladder 06-25-2023 Patient chronically self catheterizes every 3-4 hours.  He is urinating normally now.  I think his bladder is likely chronically contaminated and colonized with bacteria.  He has no systemic signs of infection.  Will hold on any further antibiotic therapy for now. 07-10-2023 stable. Has external urinary catheter. 07-11-2023 stable.  07-12-2023 stable.  Obesity, Class II, BMI 35-39.9 Estimated body mass index is 35.05 kg/m as calculated from the following:   Height as of this encounter: 6\' 2"  (1.88 m).   Weight as of this encounter: 123.8 kg.    DVT prophylaxis: enoxaparin (LOVENOX) injection 40 mg Start: 07/02/23 0800 SCDs Start: 07/01/23 1324 SCDs Start: 06/25/23 2303    Code Status: Limited: Do not attempt resuscitation (DNR) -DNR-LIMITED -Do Not Intubate/DNI  Family Communication: no family at bedside Disposition Plan: SNF placement. Awaiting for [facility]-approved SNF bed. TOC involved. Reason for continuing need for hospitalization: medically stable for DC  Objective: Vitals:   07/10/23 2026 07/11/23 0500 07/11/23 1935 07/12/23 0815  BP: (!) 140/80 114/76  120/70  Pulse: 77 76 88 80  Resp: 18 18  17   Temp: 97.6 F (36.4 C) 97.6 F (36.4 C) 98.1 F (36.7 C) 98.2 F (  36.8 C)  TempSrc: Oral Oral Oral Oral  SpO2: 100% 99% 98% 99%  Weight:      Height:        Intake/Output Summary (Last 24 hours) at 07/12/2023 0944 Last data filed at 07/11/2023 2221 Gross per 24 hour  Intake --  Output 1800 ml  Net -1800 ml   Filed Weights   06/25/23 1430 07/01/23 0936  Weight: 124 kg 123.8 kg    Examination:  Physical Exam Vitals and nursing note reviewed.  Constitutional:      General: He is not in acute distress.    Appearance: He is obese. He is not toxic-appearing or diaphoretic.  HENT:     Head: Normocephalic and atraumatic.     Nose: Nose normal.  Eyes:     General: No scleral  icterus. Cardiovascular:     Rate and Rhythm: Normal rate and regular rhythm.  Pulmonary:     Effort: Pulmonary effort is normal. No respiratory distress.     Breath sounds: Normal breath sounds.  Abdominal:     General: Abdomen is protuberant. Bowel sounds are normal. There is no distension.     Palpations: Abdomen is soft.  Musculoskeletal:     Comments: Left LE in ACE bandage and bulky dressing  Skin:    General: Skin is warm and dry.     Capillary Refill: Capillary refill takes less than 2 seconds.  Neurological:     Mental Status: He is alert and oriented to person, place, and time.     Data Reviewed: I have personally reviewed following labs and imaging studies  Basic Metabolic Panel: Recent Labs  Lab 07/08/23 0543  CREATININE 0.92   BNP (last 3 results) Recent Labs    01/19/23 2308 06/25/23 1441  BNP 15.6 66.8   Scheduled Meds:  acetaminophen  650 mg Oral Q6H   Or   acetaminophen  650 mg Rectal Q6H   atorvastatin  80 mg Oral Daily   benztropine  0.5 mg Oral Daily   cholecalciferol  1,000 Units Oral Daily   docusate sodium  100 mg Oral BID   enoxaparin (LOVENOX) injection  40 mg Subcutaneous Q24H   gabapentin  300 mg Oral TID   haloperidol  5 mg Oral QHS   [START ON 08/03/2023] haloperidol decanoate  100 mg Intramuscular Q30 days   sertraline  100 mg Oral Daily   tamsulosin  0.4 mg Oral Daily   Continuous Infusions:   LOS: 15 days   Time spent: 40 minutes  Carollee Herter, DO  [facility]  07/12/2023, 9:44 AM

## 2023-07-12 NOTE — Plan of Care (Signed)

## 2023-07-13 DIAGNOSIS — S82852A Displaced trimalleolar fracture of left lower leg, initial encounter for closed fracture: Secondary | ICD-10-CM | POA: Diagnosis not present

## 2023-07-13 NOTE — Progress Notes (Signed)
 PROGRESS NOTE  Timothy [name]  DOB: [birth year]  PCP: Timothy Maya, MD UEA:[id number]  DOA: 06/25/2023  LOS: 16 days  Hospital Day: 19  Brief narrative: Timothy [name] is a 68 y.o. male with PMH significant for prostate cancer with vertebral mets, s/p back surgery, wheelchair-bound status, neurogenic bladder requiring self cath, chronic fecal incontinence, gonorrhea, schizophrenia, PTSD, behavioral disturbance who follows up at [facility] [place]. 2/6, patient presented to ED with complaint of bilateral leg swelling He states it has been present for months but became much worse yesterday redness and swelling noted left more than right. Patient was apparently seen in the ED on January 4 for a fall.  He underwent x-ray of left knee at that time which was unremarkable and was discharged.  It seems he did not have ankle x-ray done at that time.  Patient reports pain and swelling since that fall.  In the ED, patient was hemodynamically stable CBC/CMP mostly unremarkable Catheter urinalysis showed hazy yellow color urine with small hemoglobin, moderate leukocytes, positive nitrite, many bacteria UDS negative Chest x-ray negative for any acute infiltrates X-ray left ankle showed acute mildly displaced distal fibular shaft fracture, acute mildly displaced medial malleolar fracture, mild posterior subluxation of the talar dome with respect to the distal tibia. EDP discussed with orthopedics Dr. Hulda Humphrey. Admitted to [facility] Orthopedic consulted.  2/12, underwent ORIF of left ankle 2/14, started developing paranoia.  Apparently also APS involved on family.  2/18, improving.  Insist on going home however he cannot go home. 2/19, paranoid.  Loss of insight.  Followed by psych.  Subjective:  Patient seen and examined.  "Are you a Muslim?"  We look like one. No overnight events.  Patient tells me to remove his cast from his left leg and put submerge his leg in the ice.  Difficult historian.  I told patient  that I am going to talk to his surgeon.  Assessment and plan: Left ankle trimalleolar fracture S/p ORIF -2/12 Dr. Jena Gauss Secondary to fall 5 weeks ago. Pain regimen: Tylenol as needed, oxycodone as needed Bowel regimen: MiraLAX as needed DVT prophylaxis with Lovenox in the hospital.  Aspirin on discharge. Will notify surgery as he is still in the hospital.  Hypokalemia Potassium improved with replacement.    Abnormal urinalysis Neurogenic bladder H/o prostate cancer Abnormal urinalysis likely secondary to chronic self-catheterization  Currently no evidence of infection.  Not on antibiotics Flomax to continue No urinary retention.  On external catheter.  Per   H/o back surgery Wheelchair-bound status.  Difficult disposition.   Acute exacerbation of schizophrenia At home noncompliant to medications. Not sure if he was actually taking Cogentin, Haldol, sertraline.  This was prescribed before. Patient was given haloperidol decanoate 50 mg on 2/14, 50 mg on 2/17.   Next haloperidol decanoate 100 mg on 3/17.   On Cogentin, gabapentin and sertraline.   Added Haldol 5 mg daily at night.   Mobility: Compromised mobility.  Now with further compromise.  Nonweightbearing left lower leg.  Refer to a SNF.  Surgically stable.    Goals of care   Code Status: Limited: Do not attempt resuscitation (DNR) -DNR-LIMITED -Do Not Intubate/DNI      DVT prophylaxis:  enoxaparin (LOVENOX) injection 40 mg Start: 07/02/23 0800 SCDs Start: 07/01/23 1324 SCDs Start: 06/25/23 2303   Antimicrobials: None Fluid: None Consultants: Orthopedics, psychiatry Family Communication: None at beside.   Status: Inpatient Level of care:  Med-Surg   Patient is from: Home Needs to continue  in-hospital care: Unsafe discharge planning. Anticipated d/c to: Skilled nursing facility when bed available .   Diet:  Diet Order             Diet regular Room service appropriate? Yes; Fluid consistency: Thin   Diet effective now                   Scheduled Meds:  acetaminophen  650 mg Oral Q6H   Or   acetaminophen  650 mg Rectal Q6H   atorvastatin  80 mg Oral Daily   benztropine  0.5 mg Oral Daily   cholecalciferol  1,000 Units Oral Daily   docusate sodium  100 mg Oral BID   enoxaparin (LOVENOX) injection  40 mg Subcutaneous Q24H   gabapentin  300 mg Oral TID   haloperidol  5 mg Oral QHS   [START ON 08/03/2023] haloperidol decanoate  100 mg Intramuscular Q30 days   sertraline  100 mg Oral Daily   tamsulosin  0.4 mg Oral Daily    PRN meds: alum & mag hydroxide-simeth, cyclobenzaprine, haloperidol **OR** haloperidol lactate, labetalol, melatonin, metoCLOPramide **OR** metoCLOPramide (REGLAN) injection, ondansetron **OR** ondansetron (ZOFRAN) IV, polyethylene glycol, polyvinyl alcohol   Infusions:     Antimicrobials: Anti-infectives (From admission, onward)    Start     Dose/Rate Route Frequency Ordered Stop   07/01/23 2300  vancomycin (VANCOCIN) IVPB 1000 mg/200 mL premix        1,000 mg 200 mL/hr over 60 Minutes Intravenous  Once 07/01/23 1432 07/02/23 0121   07/01/23 1415  vancomycin (VANCOCIN) IVPB 1000 mg/200 mL premix  Status:  Discontinued        1,000 mg 200 mL/hr over 60 Minutes Intravenous Every 12 hours 07/01/23 1323 07/01/23 1432   07/01/23 1132  vancomycin (VANCOCIN) powder  Status:  Discontinued          As needed 07/01/23 1153 07/01/23 1211   07/01/23 1045  ceFAZolin (ANCEF) IVPB 3g/150 mL premix  Status:  Discontinued        3 g 300 mL/hr over 30 Minutes Intravenous On call to O.R. 07/01/23 0946 07/01/23 1321   07/01/23 1017  clindamycin (CLEOCIN) 900 MG/50ML IVPB       Note to Pharmacy: Camillia Herter A: cabinet override      07/01/23 1017 07/01/23 1127   07/01/23 0851  ceFAZolin (ANCEF) 3-4 GM/150ML-% IVPB  Status:  Discontinued       Note to Pharmacy: Shanda Bumps M: cabinet override      07/01/23 0851 07/01/23 1037   07/01/23 0848  ceFAZolin  (ANCEF) 2-4 GM/100ML-% IVPB  Status:  Discontinued       Note to Pharmacy: Shanda Bumps M: cabinet override      07/01/23 0848 07/01/23 0852   06/25/23 1630  cefTRIAXone (ROCEPHIN) 1 g in sodium chloride 0.9 % 100 mL IVPB        1 g 200 mL/hr over 30 Minutes Intravenous  Once 06/25/23 1621 06/25/23 1827       Objective: Vitals:   07/12/23 1951 07/13/23 0715  BP:  126/78  Pulse: 80 77  Resp: 18 17  Temp: 98.3 F (36.8 C) 97.8 F (36.6 C)  SpO2: 100% 97%    Intake/Output Summary (Last 24 hours) at 07/13/2023 1327 Last data filed at 07/13/2023 0600 Gross per 24 hour  Intake --  Output 2700 ml  Net -2700 ml   Filed Weights   06/25/23 1430 07/01/23 0936  Weight: 124 kg 123.8 kg  Weight change:  Body mass index is 35.05 kg/m.   Physical Exam: General: Alert and awake.  Flat affect . Religious preoccupation Cardiovascular: S1-S2 normal. Respiratory: Bilateral clear. Gastrointestinal: Soft nontender. Ext: Left leg on splint and immobilized. Neuro: Unable to move legs due to chronic weakness and now with a splint. Psych: flat affect. Fixated on religion. No insight.  Tangential thoughts.    Data Review: I have personally reviewed the laboratory data and studies available.  F/u labs  Unresulted Labs (From admission, onward)     Start     Ordered   07/08/23 0500  Creatinine, serum  (enoxaparin (LOVENOX)    CrCl >/= 30 ml/min)  Weekly,   R     Comments: while on enoxaparin therapy   Question:  Specimen collection method  Answer:  Lab=Lab collect   07/01/23 1323            Total time spent in review of labs and imaging, patient evaluation, formulation of plan, documentation and communication with family: 35 minutes  Signed, Dorcas Carrow, MD [facility] 07/13/2023

## 2023-07-13 NOTE — Progress Notes (Signed)
 Physical Therapy Treatment Patient Details Name: Timothy [name] MRN: [id number] DOB: [birth year] Today's Date: 07/13/2023   History of Present Illness 68 y/o male presents to [facility] ED on 2/6 for bilateral leg swelling/pain, redness and swelling noted to L>R. Imaging findings result in trimalleolar fx of the L ankle, mildly displaced distal fibular shaft fx, mild posterior subluxation of talar dome. S/p ORIF of L trimalleolar ankle fx and internal fixation of L syndesmosis 2/12. PMH includes history of schizophrenia, chronic neurogenic bladder with a history of self catheterizations, chronic fecal incontinence, history of chronic back pain, prostate cx w/ mets to spine.    PT Comments  Pt tolerates treatment well although still demonstrates very poor recall and implementation of WB precautions. Pt consistently applies pressure through LLE when transferring despite verbal, visual and tactile cues from PT to maintain NWB. Pt is with tangential thought throughout session and has difficulty attending to the task at hand. Pt remains at a high risk for falls and re-injury due to LE weakness and impaired cognition. Patient will benefit from continued inpatient follow up therapy, <3 hours/day.    If plan is discharge home, recommend the following: Two people to help with walking and/or transfers;Two people to help with bathing/dressing/bathroom;Direct supervision/assist for medications management;Help with stairs or ramp for entrance;Assist for transportation;Assistance with cooking/housework;Direct supervision/assist for financial management;Supervision due to cognitive status   Can travel by private vehicle     No  Equipment Recommendations   (defer to post-acute setting)    Recommendations for Other Services       Precautions / Restrictions Precautions Precautions: Fall Recall of Precautions/Restrictions: Impaired Precaution/Restrictions Comments: pt is unable to maintain NWB status, reports not  putting pressure through LLE despite clearly utilizing the leg to push many times during session Required Braces or Orthoses: Splint/Cast Restrictions Weight Bearing Restrictions Per Provider Order: Yes LLE Weight Bearing Per Provider Order: Non weight bearing     Mobility  Bed Mobility Overal bed mobility: Needs Assistance Bed Mobility: Sit to Supine       Sit to supine: Supervision   General bed mobility comments: pt utilizes LLE to push up in bed despite PT cues to not    Transfers Overall transfer level: Needs assistance Equipment used: Rolling walker (2 wheels) Transfers: Sit to/from Stand, Bed to chair/wheelchair/BSC Sit to Stand: Mod assist (pt utilizing LLE to push up into standing despite PT cues to maintain foot off floor)   Step pivot transfers: Min assist       General transfer comment: pt does not maintain NWB through L foot despite multiple cues to do so. PT provides verbal cues along with prior demonstration to aide in improving maintenance of WB precautions    Ambulation/Gait                   Stairs             Wheelchair Mobility     Tilt Bed    Modified Rankin (Stroke Patients Only)       Balance Overall balance assessment: Needs assistance Sitting-balance support: No upper extremity supported, Feet supported Sitting balance-Leahy Scale: Fair     Standing balance support: Bilateral upper extremity supported, Reliant on assistive device for balance Standing balance-Leahy Scale: Poor                              Communication Communication Communication: No apparent difficulties  Cognition Arousal: Alert Behavior  During Therapy: Impulsive (tangential thought)   PT - Cognitive impairments: History of cognitive impairments, Orientation, Awareness, Memory, Attention, Initiation, Problem solving, Sequencing, Safety/Judgement   Orientation impairments: Time, Situation                   PT - Cognition  Comments: pt is very tangential, poor recall of precautions Following commands: Impaired Following commands impaired: Follows one step commands with increased time, Follows multi-step commands inconsistently    Cueing Cueing Techniques: Verbal cues, Tactile cues, Visual cues, Gestural cues  Exercises Other Exercises Other Exercises: pt holds a press up in recliner for ~45 seconds at beginning of session. He does this independently without PT cues to do so.    General Comments General comments (skin integrity, edema, etc.): VSS on RA      Pertinent Vitals/Pain Pain Assessment Pain Assessment: No/denies pain    Home Living                          Prior Function            PT Goals (current goals can now be found in the care plan section) Acute Rehab PT Goals Patient Stated Goal: Pt not able to state PT Goal Formulation: With patient Time For Goal Achievement: 07/27/23 Potential to Achieve Goals: Fair Progress towards PT goals: Progressing toward goals    Frequency    Min 1X/week      PT Plan      Co-evaluation              AM-PAC PT "6 Clicks" Mobility   Outcome Measure  Help needed turning from your back to your side while in a flat bed without using bedrails?: A Little Help needed moving from lying on your back to sitting on the side of a flat bed without using bedrails?: A Little Help needed moving to and from a bed to a chair (including a wheelchair)?: A Lot Help needed standing up from a chair using your arms (e.g., wheelchair or bedside chair)?: A Lot Help needed to walk in hospital room?: Total Help needed climbing 3-5 steps with a railing? : Total 6 Click Score: 12    End of Session Equipment Utilized During Treatment: Gait belt Activity Tolerance: Patient tolerated treatment well Patient left: in bed;with call bell/phone within reach;with bed alarm set Nurse Communication: Mobility status;Need for lift equipment PT Visit Diagnosis:  Other abnormalities of gait and mobility (R26.89);History of falling (Z91.81);Muscle weakness (generalized) (M62.81)     Time: 4540-9811 PT Time Calculation (min) (ACUTE ONLY): 27 min  Charges:    $Therapeutic Activity: 23-37 mins PT General Charges $$ ACUTE PT VISIT: 1 Visit                     Arlyss Gandy, PT, DPT Acute Rehabilitation Office [phone]    Arlyss Gandy 07/13/2023, 3:13 PM

## 2023-07-13 NOTE — Plan of Care (Signed)

## 2023-07-13 NOTE — Progress Notes (Signed)
 Occupational Therapy Treatment Patient Details Name: Timothy [name] MRN: [id number] DOB: Aug 25, 1955 Today's Date: 07/13/2023   History of present illness 68 y/o male presents to [facility] ED on 2/6 for bilateral leg swelling/pain, redness and swelling noted to L>R. Imaging findings result in trimalleolar fx of the L ankle, mildly displaced distal fibular shaft fx, mild posterior subluxation of talar dome. S/p ORIF of L trimalleolar ankle fx and internal fixation of L syndesmosis 2/12. PMH includes history of schizophrenia, chronic neurogenic bladder with a history of self catheterizations, chronic fecal incontinence, history of chronic back pain, prostate cx w/ mets to spine.   OT comments  Pt agreeable to OT session on arrival with mod agitation secondary to wanting a suppository. Pt needing max cues and up to mod A +2 to maintain weightbearing precautions this session with pt constantly reporting he can get up without A and that he is "healed". Reoriented to recent surgery and weightbearing orders per provider. Will continue to follow. Patient will benefit from continued inpatient follow up therapy, <3 hours/day        If plan is discharge home, recommend the following:  Assistance with cooking/housework;Direct supervision/assist for medications management;Direct supervision/assist for financial management;Assist for transportation;Help with stairs or ramp for entrance;Two people to help with bathing/dressing/bathroom;Two people to help with walking and/or transfers;Supervision due to cognitive status   Equipment Recommendations  Other (comment) (defer)    Recommendations for Other Services      Precautions / Restrictions Precautions Precautions: Fall Recall of Precautions/Restrictions: Impaired Precaution/Restrictions Comments: pt able to verbalized NWB, but tends to use functionally Required Braces or Orthoses: Splint/Cast Splint/Cast: Stirrup splint, Post (short leg)  splint Restrictions Weight Bearing Restrictions Per Provider Order: Yes LLE Weight Bearing Per Provider Order: Non weight bearing       Mobility Bed Mobility Overal bed mobility: Needs Assistance Bed Mobility: Supine to Sit     Supine to sit: Contact guard     General bed mobility comments: for safety    Transfers Overall transfer level: Needs assistance Equipment used: Rolling walker (2 wheels) Transfers: Sit to/from Stand, Bed to chair/wheelchair/BSC Sit to Stand: Mod assist, +2 physical assistance, +2 safety/equipment Stand pivot transfers: +2 physical assistance, +2 safety/equipment, Mod assist         General transfer comment: Pt does not want A but pt unable to mainatin precaitions without Assist and despite cues attempts to weightbear. Will lift foot off floor on command but does not maintain. Poor overall safety awareness.     Balance Overall balance assessment: Needs assistance Sitting-balance support: Feet supported, Bilateral upper extremity supported, Single extremity supported Sitting balance-Leahy Scale: Good     Standing balance support: Bilateral upper extremity supported Standing balance-Leahy Scale: Poor Standing balance comment: reliant on RW                           ADL either performed or assessed with clinical judgement   ADL Overall ADL's : Needs assistance/impaired                         Toilet Transfer: Moderate assistance;+2 for physical assistance Toilet Transfer Details (indicate cue type and reason): for stand pivot with +2 A and pt with poor adherence to weightbearing precautions constantly stating "I am healed I can put weight on it if I want" but does lift LE off ground on command and offloads weight with BUE on command" rests foot  on floor intermittently and one questionable bout of weightbearing. pt with poor safety awareness. Toileting- Clothing Manipulation and Hygiene: Maximal assistance;+2 for physical  assistance;+2 for safety/equipment;Sit to/from stand Toileting - Clothing Manipulation Details (indicate cue type and reason): posterior pericare            Extremity/Trunk Assessment Upper Extremity Assessment Upper Extremity Assessment: Generalized weakness (esp to offliad weight of LLE)   Lower Extremity Assessment Lower Extremity Assessment: Defer to PT evaluation        Vision       Perception     Praxis     Communication Communication Communication: No apparent difficulties   Cognition Arousal: Alert Behavior During Therapy: Agitated, Impulsive (agitated with staff and feeling like he needs a suppository on arrival but "wants things done his way") Cognition: No family/caregiver present to determine baseline       Memory impairment (select all impairments): Short-term memory, Working memory Attention impairment (select first level of impairment): Sustained attention Executive functioning impairment (select all impairments): Organization, Sequencing, Reasoning, Problem solving OT - Cognition Comments: patient tangential, requires frequent redirection to task. poor safety awareness. max profanity talking about having BP and wanting a suppository but wanting it to go his way                 Following commands: Impaired Following commands impaired: Follows one step commands with increased time, Follows multi-step commands inconsistently      Cueing   Cueing Techniques: Verbal cues, Tactile cues  Exercises      Shoulder Instructions       General Comments      Pertinent Vitals/ Pain       Pain Assessment Pain Assessment: No/denies pain Pain Location: Pt reports "I am healed"  Home Living                                          Prior Functioning/Environment              Frequency  Min 1X/week        Progress Toward Goals  OT Goals(current goals can now be found in the care plan section)  Progress towards OT goals:  Progressing toward goals  Acute Rehab OT Goals Patient Stated Goal: go home OT Goal Formulation: With patient Time For Goal Achievement: 07/27/23 Potential to Achieve Goals: Fair  Plan      Co-evaluation                 AM-PAC OT "6 Clicks" Daily Activity     Outcome Measure   Help from another person eating meals?: A Little Help from another person taking care of personal grooming?: A Little Help from another person toileting, which includes using toliet, bedpan, or urinal?: Total Help from another person bathing (including washing, rinsing, drying)?: Total Help from another person to put on and taking off regular upper body clothing?: A Little Help from another person to put on and taking off regular lower body clothing?: Total 6 Click Score: 12    End of Session Equipment Utilized During Treatment: Gait belt;Rolling walker (2 wheels)  OT Visit Diagnosis: Unsteadiness on feet (R26.81);Muscle weakness (generalized) (M62.81);Other symptoms and signs involving cognitive function;History of falling (Z91.81)   Activity Tolerance Patient tolerated treatment well   Patient Left with call bell/phone within reach;with chair alarm set;in chair   Nurse Communication Mobility status  Time: 1027-1050 OT Time Calculation (min): 23 min  Charges: OT General Charges $OT Visit: 1 Visit OT Treatments $Self Care/Home Management : 23-37 mins  Tyler Deis, OTR/L [facility] Acute Rehabilitation Office: [phone]   Myrla Halsted 07/13/2023, 3:05 PM

## 2023-07-14 DIAGNOSIS — S82852A Displaced trimalleolar fracture of left lower leg, initial encounter for closed fracture: Secondary | ICD-10-CM | POA: Diagnosis not present

## 2023-07-14 MED ORDER — ASPIRIN 325 MG PO TABS
325.0000 mg | ORAL_TABLET | Freq: Every day | ORAL | Status: DC
  Administered 2023-07-14 – 2023-07-22 (×8): 325 mg via ORAL
  Filled 2023-07-14 (×8): qty 1

## 2023-07-14 NOTE — Progress Notes (Signed)
 PROGRESS NOTE  Timothy [name]  DOB: [birth year]  PCP: Wendi Maya, MD RUE:[id number]  DOA: 06/25/2023  LOS: 17 days  Hospital Day: 20  Brief narrative: Timothy [name] is a 68 y.o. male with PMH significant for prostate cancer with vertebral mets, s/p back surgery, wheelchair-bound status, neurogenic bladder requiring self cath, chronic fecal incontinence, gonorrhea, schizophrenia, PTSD, behavioral disturbance who follows up at [facility] [place]. 2/6, patient presented to ED with complaint of bilateral leg swelling He states it has been present for months but became much worse yesterday redness and swelling noted left more than right. Patient was apparently seen in the ED on January 4 for a fall.  He underwent x-ray of left knee at that time which was unremarkable and was discharged.  It seems he did not have ankle x-ray done at that time.  Patient reports pain and swelling since that fall.  In the ED, patient was hemodynamically stable CBC/CMP mostly unremarkable Catheter urinalysis showed hazy yellow color urine with small hemoglobin, moderate leukocytes, positive nitrite, many bacteria UDS negative Chest x-ray negative for any acute infiltrates X-ray left ankle showed acute mildly displaced distal fibular shaft fracture, acute mildly displaced medial malleolar fracture, mild posterior subluxation of the talar dome with respect to the distal tibia. EDP discussed with orthopedics Dr. Hulda Humphrey. Admitted to [facility] Orthopedic consulted.  2/12, underwent ORIF of left ankle 2/14, started developing paranoia.  Apparently also APS involved on family.  2/18, improving.  Insist on going home however he cannot go home. 2/19, paranoid.  Loss of insight.  Followed by psych.  Subjective:  Patient seen and examined.  Patient interactive today.  "Where is my snacks? Next time you make sure to bring some food with you "   Assessment and plan: Left ankle trimalleolar fracture S/p ORIF -2/12 Dr.  Jena Gauss Secondary to fall 5 weeks ago. Pain regimen: Tylenol as needed, oxycodone as needed.   not using any narcotics. Bowel regimen: MiraLAX as needed DVT prophylaxis with Lovenox.  Will change to aspirin 325 mg daily. Patient is still in the hospital.  Orthopedics notified.  Planning for repeat ankle x-rays tomorrow and possible cam boot.  Hypokalemia Potassium improved with replacement.    Abnormal urinalysis Neurogenic bladder H/o prostate cancer Abnormal urinalysis likely secondary to chronic self-catheterization  Currently no evidence of infection.  Not on antibiotics Flomax to continue No urinary retention.  On external catheter.  Per   H/o back surgery Wheelchair-bound status.  Difficult disposition.   Acute exacerbation of schizophrenia At home noncompliant to medications. Not sure if he was actually taking Cogentin, Haldol, sertraline.  This was prescribed before. Patient was given haloperidol decanoate 50 mg on 2/14, 50 mg on 2/17.   Next haloperidol decanoate 100 mg on 3/17.   On Cogentin, gabapentin and sertraline.   Added Haldol 5 mg daily at night. Better today.   Mobility: Compromised mobility.  Now with further compromise.  Nonweightbearing left lower leg.  Refer to a SNF.  Surgically stable.    Goals of care   Code Status: Limited: Do not attempt resuscitation (DNR) -DNR-LIMITED -Do Not Intubate/DNI      DVT prophylaxis:  SCDs Start: 07/01/23 1324 SCDs Start: 06/25/23 2303   Antimicrobials: None Fluid: None Consultants: Orthopedics, psychiatry Family Communication: None at beside.   Status: Inpatient Level of care:  Med-Surg   Patient is from: Home Needs to continue in-hospital care: Unsafe discharge planning. Anticipated d/c to: Skilled nursing facility when bed available .  Diet:  Diet Order             Diet regular Room service appropriate? Yes; Fluid consistency: Thin  Diet effective now                   Scheduled Meds:   acetaminophen  650 mg Oral Q6H   Or   acetaminophen  650 mg Rectal Q6H   aspirin  325 mg Oral Daily   atorvastatin  80 mg Oral Daily   benztropine  0.5 mg Oral Daily   cholecalciferol  1,000 Units Oral Daily   docusate sodium  100 mg Oral BID   gabapentin  300 mg Oral TID   haloperidol  5 mg Oral QHS   [START ON 08/03/2023] haloperidol decanoate  100 mg Intramuscular Q30 days   sertraline  100 mg Oral Daily   tamsulosin  0.4 mg Oral Daily    PRN meds: alum & mag hydroxide-simeth, cyclobenzaprine, haloperidol **OR** haloperidol lactate, labetalol, melatonin, metoCLOPramide **OR** metoCLOPramide (REGLAN) injection, ondansetron **OR** ondansetron (ZOFRAN) IV, polyethylene glycol, polyvinyl alcohol   Infusions:     Antimicrobials: Anti-infectives (From admission, onward)    Start     Dose/Rate Route Frequency Ordered Stop   07/01/23 2300  vancomycin (VANCOCIN) IVPB 1000 mg/200 mL premix        1,000 mg 200 mL/hr over 60 Minutes Intravenous  Once 07/01/23 1432 07/02/23 0121   07/01/23 1415  vancomycin (VANCOCIN) IVPB 1000 mg/200 mL premix  Status:  Discontinued        1,000 mg 200 mL/hr over 60 Minutes Intravenous Every 12 hours 07/01/23 1323 07/01/23 1432   07/01/23 1132  vancomycin (VANCOCIN) powder  Status:  Discontinued          As needed 07/01/23 1153 07/01/23 1211   07/01/23 1045  ceFAZolin (ANCEF) IVPB 3g/150 mL premix  Status:  Discontinued        3 g 300 mL/hr over 30 Minutes Intravenous On call to O.R. 07/01/23 0946 07/01/23 1321   07/01/23 1017  clindamycin (CLEOCIN) 900 MG/50ML IVPB       Note to Pharmacy: Camillia Herter A: cabinet override      07/01/23 1017 07/01/23 1127   07/01/23 0851  ceFAZolin (ANCEF) 3-4 GM/150ML-% IVPB  Status:  Discontinued       Note to Pharmacy: Shanda Bumps M: cabinet override      07/01/23 0851 07/01/23 1037   07/01/23 0848  ceFAZolin (ANCEF) 2-4 GM/100ML-% IVPB  Status:  Discontinued       Note to Pharmacy: Shanda Bumps M:  cabinet override      07/01/23 0848 07/01/23 0852   06/25/23 1630  cefTRIAXone (ROCEPHIN) 1 g in sodium chloride 0.9 % 100 mL IVPB        1 g 200 mL/hr over 30 Minutes Intravenous  Once 06/25/23 1621 06/25/23 1827       Objective: Vitals:   07/14/23 0722 07/14/23 1314  BP: 108/62 110/68  Pulse: 84 78  Resp: 18 18  Temp: 98.1 F (36.7 C) 98.2 F (36.8 C)  SpO2: 99% 98%    Intake/Output Summary (Last 24 hours) at 07/14/2023 1330 Last data filed at 07/14/2023 0800 Gross per 24 hour  Intake 600 ml  Output 2400 ml  Net -1800 ml   Filed Weights   06/25/23 1430 07/01/23 0936  Weight: 124 kg 123.8 kg   Weight change:  Body mass index is 35.05 kg/m.   Physical Exam: General: Alert and awake.  Flat affect.  More interactive today. Cardiovascular: S1-S2 normal. Respiratory: Bilateral clear. Gastrointestinal: Soft nontender. Ext: Left leg on splint and immobilized. Neuro: Unable to move legs due to chronic weakness and now with a splint. Psych: flat affect. Tangential thoughts.  Denies any delusions or hallucinations.   Data Review: I have personally reviewed the laboratory data and studies available.  F/u labs  Unresulted Labs (From admission, onward)    None       Total time spent in review of labs and imaging, patient evaluation, formulation of plan, documentation and communication with family: 35 minutes  Signed, Dorcas Carrow, MD [facility] 07/14/2023

## 2023-07-14 NOTE — TOC Progression Note (Signed)
 Transition of Care [facility]) - Progression Note    Patient Details  Name: Timothy [name] MRN: [id number] Date of Birth: [birth year]  Transition of [facility]) CM/SW Contact  Lorri Frederick, LCSW Phone Number: 07/14/2023, 8:10 AM  Clinical Narrative:   CSW made the following [facility] SNF referrals by fax today: [facility]: faxed for the second time, again fax failed. [facility]-fax confirmed [facility]-fax confirmed    Expected Discharge Plan: Skilled Nursing Facility Barriers to Discharge: Continued Medical Work up, English as a second language teacher, SNF Pending bed offer  Expected Discharge Plan and Services In-house Referral: Clinical Social Work   Post Acute Care Choice: Skilled Nursing Facility Living arrangements for the past 2 months: Single Family Home                                       Social Determinants of Health (SDOH) Interventions SDOH Screenings   Food Insecurity: Patient Declined (06/26/2023)  Housing: Patient Declined (06/26/2023)  Transportation Needs: Patient Declined (06/26/2023)  Utilities: Patient Declined (06/26/2023)  Social Connections: Unknown (06/26/2023)  Tobacco Use: Medium Risk (07/01/2023)    Readmission Risk Interventions     No data to display

## 2023-07-14 NOTE — Progress Notes (Signed)
 Orthopaedic Trauma Progress Note  SUBJECTIVE: Doing well this morning.  Minimal pain in the left ankle.  Making progress with therapies.  States "my ankle is healed but you can get x-rays if you want".  TOC team continues working on SNF placement.  OBJECTIVE:  Vitals:   07/14/23 0304 07/14/23 0722  BP: 121/74 108/62  Pulse: 69 84  Resp: 16 18  Temp: 98.2 F (36.8 C) 98.1 F (36.7 C)  SpO2: 99% 99%    General: Sitting up in bed, no acute distress Respiratory: No increased work of breathing.  Left lower extremity: Well-padded, well-fitting splint in place.  Nontender above splint.  Endorses sensation to light touch over the toes.  Able to wiggle the toes a small amount.  Toes warm and well-perfused.  IMAGING: Stable post op imaging.  Repeat x-rays left ankle ordered for 07/15/2023  LABS:  No results found for this or any previous visit (from the past 24 hours).   ASSESSMENT: Timothy [name] is a 68 y.o. male, 13 Days Post-Op s/p OPEN REDUCTION INTERNAL FIXATION LEFT ANKLE FRACTURE  CV/Blood loss: Hemoglobin stable.  Hemodynamically stable  PLAN: Weightbearing: NWB LLE ROM: Okay for knee motion.  Maintain splint to lower leg Incisional and dressing care: Dressings left intact until follow-up  Showering: Okay to shower, splint must stay clean and dry Orthopedic device(s): Splint LLE Pain management: Continue current multimodal regimen VTE prophylaxis: Lovenox, SCDs ID: Vancomycin post op completed Foley/Lines:  No foley, KVO IVFs Impediments to Fracture Healing: Vitamin D level 28, Continue on home dose supplementation Dispo: Plan for repeat x-rays left ankle 07/15/2023.  Will likely remove splint and sutures at that time and transition to CAM boot.    PT/OT evaluation ongoing, currently recommending SNF.  Okay for discharge from ortho standpoint once SNF available.  I have signed and placed discharge Rx for pain medication, DVT prophylaxis, muscle relaxer in patient's  chart  D/C recommendations: -Oxycodone 10 mg, Flexeril for pain control -Aspirin 325 mg daily x 30 days for DVT prophylaxis -Continue home dose 1000 units Vit D supplementation daily  Follow - up plan: 2 weeks after discharge for wound check, repeat x-rays,    Contact information:  Truitt Merle MD, Thyra Breed PA-C. After hours and holidays please check [email or web address] for group call information for [facility]   Thompson Caul, PA-C [phone] (office) [email or web address]

## 2023-07-14 NOTE — Plan of Care (Signed)

## 2023-07-15 ENCOUNTER — Inpatient Hospital Stay (HOSPITAL_COMMUNITY): Payer: No Typology Code available for payment source

## 2023-07-15 DIAGNOSIS — S82852A Displaced trimalleolar fracture of left lower leg, initial encounter for closed fracture: Secondary | ICD-10-CM | POA: Diagnosis not present

## 2023-07-15 NOTE — Progress Notes (Signed)
 Orthopaedic Trauma Progress Note  SUBJECTIVE: Doing well this morning.  Minimal pain in the left ankle.  TOC team continues working on SNF placement.  OBJECTIVE:  Vitals:   07/15/23 0539 07/15/23 0722  BP: 120/79 126/69  Pulse: 68 83  Resp: 16 18  Temp: 97.9 F (36.6 C) 98.2 F (36.8 C)  SpO2: 98% 99%    General: Sitting up in bed, no acute distress Respiratory: No increased work of breathing.  Left lower extremity: Splint cut down.  Medial and lateral ankle incisions healing well.  Sutures were removed.  Patient tolerated this well.  Tolerates gentle ankle range of motion.  Endorses sensation to light touch over the dorsal and plantar aspect of the foot.  Able to wiggle the toes a small amount.  Toes warm and well-perfused.  IMAGING: Repeat imaging left ankle performed this morning stable.  No signs of any hardware failure or loosening.  Ankle mortise well-maintained.  LABS:  No results found for this or any previous visit (from the past 24 hours).   ASSESSMENT: Timothy [name] is a 68 y.o. male, 14 Days Post-Op s/p OPEN REDUCTION INTERNAL FIXATION LEFT ANKLE FRACTURE  CV/Blood loss: Hemoglobin stable.  Hemodynamically stable  PLAN: Weightbearing: NWB LLE ROM: Okay for knee motion.  Okay to begin gentle ankle range of motion as tolerated Incisional and dressing care: Sutures removed and dressing changed today.  Continue to change as needed Showering: Okay to shower, splint must stay clean and dry Orthopedic device(s): CAM boot LLE when OOB Pain management: Continue current multimodal regimen VTE prophylaxis: Lovenox, SCDs ID: Vancomycin post op completed Foley/Lines:  No foley, KVO IVFs Impediments to Fracture Healing: Vitamin D level 28, Continue on home dose supplementation Dispo: Ortho issues stable. Okay for discharge from ortho standpoint once SNF available.  I have signed and placed discharge Rx for pain medication, DVT prophylaxis, muscle relaxer in patient's  chart  D/C recommendations: -Oxycodone 10 mg, Flexeril for pain control -Aspirin 325 mg daily x 30 days for DVT prophylaxis -Continue home dose 1000 units Vit D supplementation daily  Follow - up plan: 2 weeks after discharge for wound check, repeat x-rays,    Contact information:  Truitt Merle MD, Thyra Breed PA-C. After hours and holidays please check [email or web address] for group call information for [facility]   Thompson Caul, PA-C [phone] (office) [email or web address]

## 2023-07-15 NOTE — Progress Notes (Signed)
 PROGRESS NOTE  [name]  DOB: [birth year]  PCP: Wendi Maya, MD HQI:[id number]  DOA: 06/25/2023  LOS: 18 days  Hospital Day: 21  Brief narrative: Timothy [name] is a 68 y.o. male with PMH significant for prostate cancer with vertebral mets, s/p back surgery, wheelchair-bound status, neurogenic bladder requiring self cath, chronic fecal incontinence, gonorrhea, schizophrenia, PTSD, behavioral disturbance who follows up at [facility] [place]. 2/6, patient presented to ED with complaint of bilateral leg swelling He states it has been present for months but became much worse yesterday redness and swelling noted left more than right. Patient was apparently seen in the ED on January 4 for a fall.  He underwent x-ray of left knee at that time which was unremarkable and was discharged.  It seems he did not have ankle x-ray done at that time.  Patient reports pain and swelling since that fall.  In the ED, patient was hemodynamically stable CBC/CMP mostly unremarkable Catheter urinalysis showed hazy yellow color urine with small hemoglobin, moderate leukocytes, positive nitrite, many bacteria UDS negative Chest x-ray negative for any acute infiltrates X-ray left ankle showed acute mildly displaced distal fibular shaft fracture, acute mildly displaced medial malleolar fracture, mild posterior subluxation of the talar dome with respect to the distal tibia. EDP discussed with orthopedics Dr. Hulda Humphrey. Admitted to [facility] Orthopedic consulted.  2/12, underwent ORIF of left ankle 2/14, started developing paranoia.  Apparently also APS involved on family.  2/18, improving.  Insist on going home however he cannot go home. 2/19, paranoid.  Loss of insight.  Followed by psych. 2/25, does have slight tangential thoughts.  Psych thinks this is his baseline.  Medically stable to transfer to SNF when bed available.  Subjective:  No new events. For x-ray today and possibly for Calm boot.  Occasionally impulsive  but able to be consoled.   Assessment and plan: Left ankle trimalleolar fracture S/p ORIF -2/12 Dr. Jena Gauss Secondary to fall 5 weeks ago. Pain regimen: Tylenol as needed, oxycodone as needed.   not using any narcotics. Bowel regimen: MiraLAX as needed Will change to aspirin 325 mg daily. Seen by Ortho.  Planning for repeat x-ray today and giving cam boot.  Hypokalemia Potassium improved with replacement.    Abnormal urinalysis Neurogenic bladder H/o prostate cancer Abnormal urinalysis likely secondary to chronic self-catheterization  Currently no evidence of infection.  Not on antibiotics Flomax to continue No urinary retention.  On external catheter.     H/o back surgery Wheelchair-bound status.  Difficult disposition.   Acute exacerbation of schizophrenia At home noncompliant to medications. Not sure if he was actually taking Cogentin, Haldol, sertraline.  This was prescribed before. Patient was given haloperidol decanoate 50 mg on 2/14, 50 mg on 2/17.   Next haloperidol decanoate 100 mg on 3/17.   On Cogentin, gabapentin and sertraline.   Added Haldol 5 mg daily at night. Better today.   Mobility: Compromised mobility.  Now with further compromise.  Nonweightbearing left lower leg.  Refer to a SNF.  Surgically stable.    Goals of care   Code Status: Limited: Do not attempt resuscitation (DNR) -DNR-LIMITED -Do Not Intubate/DNI      DVT prophylaxis:  SCDs Start: 07/01/23 1324 SCDs Start: 06/25/23 2303   Antimicrobials: None Fluid: None Consultants: Orthopedics, psychiatry Family Communication: None at beside.   Status: Inpatient Level of care:  Med-Surg   Patient is from: Home Needs to continue in-hospital care: Unsafe discharge planning. Anticipated d/c to: Skilled nursing facility  when bed available .   Diet:  Diet Order             Diet regular Room service appropriate? Yes; Fluid consistency: Thin  Diet effective now                    Scheduled Meds:  acetaminophen  650 mg Oral Q6H   Or   acetaminophen  650 mg Rectal Q6H   aspirin  325 mg Oral Daily   atorvastatin  80 mg Oral Daily   benztropine  0.5 mg Oral Daily   cholecalciferol  1,000 Units Oral Daily   docusate sodium  100 mg Oral BID   gabapentin  300 mg Oral TID   haloperidol  5 mg Oral QHS   [START ON 08/03/2023] haloperidol decanoate  100 mg Intramuscular Q30 days   sertraline  100 mg Oral Daily   tamsulosin  0.4 mg Oral Daily    PRN meds: alum & mag hydroxide-simeth, cyclobenzaprine, haloperidol **OR** haloperidol lactate, labetalol, melatonin, metoCLOPramide **OR** metoCLOPramide (REGLAN) injection, ondansetron **OR** ondansetron (ZOFRAN) IV, polyethylene glycol, polyvinyl alcohol   Infusions:     Antimicrobials: Anti-infectives (From admission, onward)    Start     Dose/Rate Route Frequency Ordered Stop   07/01/23 2300  vancomycin (VANCOCIN) IVPB 1000 mg/200 mL premix        1,000 mg 200 mL/hr over 60 Minutes Intravenous  Once 07/01/23 1432 07/02/23 0121   07/01/23 1415  vancomycin (VANCOCIN) IVPB 1000 mg/200 mL premix  Status:  Discontinued        1,000 mg 200 mL/hr over 60 Minutes Intravenous Every 12 hours 07/01/23 1323 07/01/23 1432   07/01/23 1132  vancomycin (VANCOCIN) powder  Status:  Discontinued          As needed 07/01/23 1153 07/01/23 1211   07/01/23 1045  ceFAZolin (ANCEF) IVPB 3g/150 mL premix  Status:  Discontinued        3 g 300 mL/hr over 30 Minutes Intravenous On call to O.R. 07/01/23 0946 07/01/23 1321   07/01/23 1017  clindamycin (CLEOCIN) 900 MG/50ML IVPB       Note to Pharmacy: Camillia Herter A: cabinet override      07/01/23 1017 07/01/23 1127   07/01/23 0851  ceFAZolin (ANCEF) 3-4 GM/150ML-% IVPB  Status:  Discontinued       Note to Pharmacy: Shanda Bumps M: cabinet override      07/01/23 0851 07/01/23 1037   07/01/23 0848  ceFAZolin (ANCEF) 2-4 GM/100ML-% IVPB  Status:  Discontinued       Note to Pharmacy:  Shanda Bumps M: cabinet override      07/01/23 0848 07/01/23 0852   06/25/23 1630  cefTRIAXone (ROCEPHIN) 1 g in sodium chloride 0.9 % 100 mL IVPB        1 g 200 mL/hr over 30 Minutes Intravenous  Once 06/25/23 1621 06/25/23 1827       Objective: Vitals:   07/15/23 0539 07/15/23 0722  BP: 120/79 126/69  Pulse: 68 83  Resp: 16 18  Temp: 97.9 F (36.6 C) 98.2 F (36.8 C)  SpO2: 98% 99%    Intake/Output Summary (Last 24 hours) at 07/15/2023 1100 Last data filed at 07/15/2023 0214 Gross per 24 hour  Intake --  Output 2150 ml  Net -2150 ml   Filed Weights   06/25/23 1430 07/01/23 0936  Weight: 124 kg 123.8 kg   Weight change:  Body mass index is 35.05 kg/m.   Physical Exam:  Alert awake.  Eating breakfast.   Data Review: I have personally reviewed the laboratory data and studies available.  F/u labs  Unresulted Labs (From admission, onward)    None       Total time spent in review of labs and imaging, patient evaluation, formulation of plan, documentation and communication with family: 25 minutes  Signed, Dorcas Carrow, MD [facility] 07/15/2023

## 2023-07-15 NOTE — Plan of Care (Signed)

## 2023-07-15 NOTE — Plan of Care (Signed)
  Problem: Education: Goal: Knowledge of General Education information will improve Description: Including pain rating scale, medication(s)/side effects and non-pharmacologic comfort measures 07/15/2023 0118 by Clint Lipps, RN Outcome: Progressing 07/15/2023 0118 by Clint Lipps, RN Outcome: Progressing   Problem: Health Behavior/Discharge Planning: Goal: Ability to manage health-related needs will improve 07/15/2023 0118 by Clint Lipps, RN Outcome: Progressing 07/15/2023 0118 by Clint Lipps, RN Outcome: Progressing   Problem: Clinical Measurements: Goal: Ability to maintain clinical measurements within normal limits will improve 07/15/2023 0118 by Clint Lipps, RN Outcome: Progressing 07/15/2023 0118 by Clint Lipps, RN Outcome: Progressing Goal: Will remain free from infection 07/15/2023 0118 by Clint Lipps, RN Outcome: Progressing 07/15/2023 0118 by Clint Lipps, RN Outcome: Progressing Goal: Diagnostic test results will improve 07/15/2023 0118 by Clint Lipps, RN Outcome: Progressing 07/15/2023 0118 by Clint Lipps, RN Outcome: Progressing Goal: Respiratory complications will improve 07/15/2023 0118 by Clint Lipps, RN Outcome: Progressing 07/15/2023 0118 by Clint Lipps, RN Outcome: Progressing Goal: Cardiovascular complication will be avoided 07/15/2023 0118 by Clint Lipps, RN Outcome: Progressing 07/15/2023 0118 by Clint Lipps, RN Outcome: Progressing   Problem: Activity: Goal: Risk for activity intolerance will decrease 07/15/2023 0118 by Clint Lipps, RN Outcome: Progressing 07/15/2023 0118 by Clint Lipps, RN Outcome: Progressing   Problem: Nutrition: Goal: Adequate nutrition will be maintained 07/15/2023 0118 by Clint Lipps, RN Outcome: Progressing 07/15/2023 0118 by Clint Lipps, RN Outcome: Progressing   Problem: Coping: Goal: Level of anxiety will decrease 07/15/2023  0118 by Clint Lipps, RN Outcome: Progressing 07/15/2023 0118 by Clint Lipps, RN Outcome: Progressing   Problem: Elimination: Goal: Will not experience complications related to bowel motility 07/15/2023 0118 by Clint Lipps, RN Outcome: Progressing 07/15/2023 0118 by Clint Lipps, RN Outcome: Progressing Goal: Will not experience complications related to urinary retention 07/15/2023 0118 by Clint Lipps, RN Outcome: Progressing 07/15/2023 0118 by Clint Lipps, RN Outcome: Progressing   Problem: Pain Managment: Goal: General experience of comfort will improve and/or be controlled 07/15/2023 0118 by Clint Lipps, RN Outcome: Progressing 07/15/2023 0118 by Clint Lipps, RN Outcome: Progressing   Problem: Safety: Goal: Ability to remain free from injury will improve 07/15/2023 0118 by Clint Lipps, RN Outcome: Progressing 07/15/2023 0118 by Clint Lipps, RN Outcome: Progressing   Problem: Skin Integrity: Goal: Risk for impaired skin integrity will decrease 07/15/2023 0118 by Clint Lipps, RN Outcome: Progressing 07/15/2023 0118 by Clint Lipps, RN Outcome: Progressing

## 2023-07-15 NOTE — TOC Progression Note (Signed)
 Transition of Care [facility]) - Progression Note    Patient Details  Name: Timothy [name] MRN: [id number] Date of Birth: 1956-01-22  Transition of [facility] CM/SW Contact  Lorri Frederick, LCSW Phone Number: 07/15/2023, 2:46 PM  Clinical Narrative:   CSW sent email to [facility].  She initially stated that 2 case managers had contacted her about pt DC home with [facility], did not respond to CSW question as to who these case managers were.  She requested updated MD/PT/OT notes and said she will work on getting SNF auth approval.   Updated notes emailed.    Expected Discharge Plan: Skilled Nursing Facility Barriers to Discharge: Continued Medical Work up, English as a second language teacher, SNF Pending bed offer  Expected Discharge Plan and Services In-house Referral: Clinical Social Work   Post Acute Care Choice: Skilled Nursing Facility Living arrangements for the past 2 months: Single Family Home                                       Social Determinants of Health (SDOH) Interventions SDOH Screenings   Food Insecurity: Patient Declined (06/26/2023)  Housing: Patient Declined (06/26/2023)  Transportation Needs: Patient Declined (06/26/2023)  Utilities: Patient Declined (06/26/2023)  Social Connections: Unknown (06/26/2023)  Tobacco Use: Medium Risk (07/01/2023)    Readmission Risk Interventions     No data to display

## 2023-07-15 NOTE — Progress Notes (Signed)
 Orthopedic Tech Progress Note Patient Details:  Timothy [name] 28-Oct-1955 [id number]  Ortho Devices Type of Ortho Device: CAM walker Ortho Device/Splint Location: LLE Ortho Device/Splint Interventions: Ordered, Other (comment)dropped off CAM WALKER BOOT to room, patient was getting changed at the moment    Post Interventions Patient Tolerated: Well Instructions Provided: Care of device  [name] 07/15/2023, 12:02 PM

## 2023-07-16 DIAGNOSIS — S82852A Displaced trimalleolar fracture of left lower leg, initial encounter for closed fracture: Secondary | ICD-10-CM | POA: Diagnosis not present

## 2023-07-16 NOTE — Progress Notes (Signed)
 Physical Therapy Treatment Patient Details Name: Timothy [name] MRN: [id number] DOB: [birth year] Today's Date: 07/16/2023   History of Present Illness 68 y/o male presents to [facility] ED on 2/6 for bilateral leg swelling/pain, redness and swelling noted to L>R. Imaging findings result in trimalleolar fx of the L ankle, mildly displaced distal fibular shaft fx, mild posterior subluxation of talar dome. S/p ORIF of L trimalleolar ankle fx and internal fixation of L syndesmosis 2/12. PMH includes history of schizophrenia, chronic neurogenic bladder with a history of self catheterizations, chronic fecal incontinence, history of chronic back pain, prostate cx w/ mets to spine.    PT Comments  Pt received in chair, agreeable to therapy session with encouragement, as pt initially amenable to getting up to wheelchair for BUE strengthening/mobility into the hallway in wheelchair. Pt resistant to instruction about LLE NWB precautions and states "my doctor took my stitches out and told me my ankle is healed". Pt instructed on bone healing timelines and that imaging yesterday still shows sub-acute fracture but pt not receptive. Pt performed lateral scoot pivot from drop arm recliner to wheelchair toward his R side, then became resistant to further participation in session, including wheelchair mobility or transfer training. Pt allows PTA to place chair alarm in seat behind his back and requesting to leave, stating "security will have to come help me back to the bed, I won't get up unless security comes, RN notified.    If plan is discharge home, recommend the following: Two people to help with walking and/or transfers;Two people to help with bathing/dressing/bathroom;Direct supervision/assist for medications management;Help with stairs or ramp for entrance;Assist for transportation;Assistance with cooking/housework;Direct supervision/assist for financial management;Supervision due to cognitive status   Can travel by  private vehicle     No  Equipment Recommendations  None recommended by PT    Recommendations for Other Services       Precautions / Restrictions Precautions Precautions: Fall Recall of Precautions/Restrictions: Impaired Precaution/Restrictions Comments: pt is unable to recall or adhere to NWB L LE Required Braces or Orthoses: Other Brace Other Brace: CAM walker OOB L LE Restrictions Weight Bearing Restrictions Per Provider Order: Yes LLE Weight Bearing Per Provider Order: Non weight bearing Other Position/Activity Restrictions: pt states he is no longer NWB status     Mobility  Bed Mobility Overal bed mobility: Needs Assistance             General bed mobility comments: Pt received in recliner and up in wheelchair at end of session.    Transfers Overall transfer level: Needs assistance Equipment used: Rolling walker (2 wheels) Transfers: Bed to chair/wheelchair/BSC            Lateral/Scoot Transfers: Min assist General transfer comment: Lateral scoot towards R side, requires full support to keep L LE elevated to maintain NWB. Pt using BUE well to lift hips off chair surface, but not listening well to cues to squat and pivot for energy conservation. Pt encouraged to try stand pivot transfer for return from wheelchair to his bed so he can be in safer posture as he changed him mind about participating in wheelchair mobility once in chair, but pt refusing. RN called to room but pt still refusing to get out of wheelchair, and asked that security come get him and he will not participate in session any further, RN agreeable to call security at end of session.    Ambulation/Gait               General  Gait Details: visual/verbal demo for hopping/pivoting on one leg and pt states he knows he is not yet able to do this and that "I can walk with both legs", pt reoriented to his LLE precs but pt refusing to agree with following precautions.   Stairs              Wheelchair Mobility     Tilt Bed    Modified Rankin (Stroke Patients Only)       Balance Overall balance assessment: Needs assistance Sitting-balance support: No upper extremity supported, Feet supported Sitting balance-Leahy Scale: Fair Sitting balance - Comments: able to perform chair push-ups in recliner while leaning back against chair surface                                    Communication Communication Communication: No apparent difficulties  Cognition Arousal: Alert Behavior During Therapy: Flat affect, Agitated   PT - Cognitive impairments: Memory, Attention, Sequencing, Problem solving, Safety/Judgement   Orientation impairments: Time, Situation                   PT - Cognition Comments: pt is very tangential, poor recall of precautions, pt now states "my Doctor told me my L leg is healed, they took the stitches out so now I can walk again" when PTA notified him the imaging still shows a fracture, not yet healed, pt states this is not correct and asking for additional x-rays. Pt tangential and confabulating stories such as "my psychiatrist is waiting for me in the ED, if I go to the ED I can go to the [place]." Pt not amenable to logical discussion on insurance approval process and importance of participating in therapies to get stronger/ensure his safety so he can be independent. Following commands: Impaired Following commands impaired: Follows one step commands with increased time, Follows one step commands inconsistently (initially following commands, but once in chair he refuses to follow any additional commands)    Cueing Cueing Techniques: Verbal cues, Tactile cues, Visual cues, Gestural cues  Exercises Other Exercises Other Exercises: pt holds a press up in recliner for ~50 seconds at beginning of session, leaning against back of chair. Pt ignoring cues to perform multiple reps with shorter hold.    General Comments General comments (skin  integrity, edema, etc.): Pt with LLE CAM boot donned when PTA arrived to the room      Pertinent Vitals/Pain Pain Assessment Pain Assessment: PAINAD Breathing: normal Negative Vocalization: none Facial Expression: sad, frightened, frown Body Language: tense, distressed pacing, fidgeting Consolability: unable to console, distract or reassure PAINAD Score: 4 Pain Location: Pt currently confused and denies any pain but his increased agitation demonstrates that he may be in more pain than he is admitting to. Pain Descriptors / Indicators: Grimacing, Operative site guarding Pain Intervention(s): Limited activity within patient's tolerance, Monitored during session, Repositioned, Other (comment) (pt refusing ice or pain meds for LLE)    Home Living                          Prior Function            PT Goals (current goals can now be found in the care plan section) Acute Rehab PT Goals Patient Stated Goal: To be able to walk so I can go home PT Goal Formulation: With patient Time For Goal Achievement: 07/27/23 Progress towards  PT goals: Progressing toward goals    Frequency    Min 1X/week      PT Plan      AM-PAC PT "6 Clicks" Mobility   Outcome Measure  Help needed turning from your back to your side while in a flat bed without using bedrails?: A Little Help needed moving from lying on your back to sitting on the side of a flat bed without using bedrails?: A Little Help needed moving to and from a bed to a chair (including a wheelchair)?: A Lot Help needed standing up from a chair using your arms (e.g., wheelchair or bedside chair)?: A Lot Help needed to walk in hospital room?: Total Help needed climbing 3-5 steps with a railing? : Total 6 Click Score: 12    End of Session Equipment Utilized During Treatment: Gait belt;Other (comment) (LLE CAM boot) Activity Tolerance: Treatment limited secondary to agitation Patient left: in chair;with call bell/phone  within reach;with chair alarm set;Other (comment) (chair alarm pad behind him in [facility]) Nurse Communication: Mobility status;Precautions;Other (comment) (pt agitation) PT Visit Diagnosis: Other abnormalities of gait and mobility (R26.89);History of falling (Z91.81);Muscle weakness (generalized) (M62.81)     Time: 1610-9604 PT Time Calculation (min) (ACUTE ONLY): 22 min  Charges:    $Therapeutic Activity: 8-22 mins PT General Charges $$ ACUTE PT VISIT: 1 Visit                     Zaxton Angerer P., PTA Acute Rehabilitation Services Secure Chat Preferred 9a-5:30pm Office: [phone]    Dorathy Kinsman [facility] 07/16/2023, 3:10 PM

## 2023-07-16 NOTE — Progress Notes (Signed)
 Physical Therapy Treatment Patient Details Name: Timothy [name] MRN: [id number] DOB: 1955-06-01 Today's Date: 07/16/2023   History of Present Illness 68 y/o male presents to [facility] ED on 2/6 for bilateral leg swelling/pain, redness and swelling noted to L>R. Imaging findings result in trimalleolar fx of the L ankle, mildly displaced distal fibular shaft fx, mild posterior subluxation of talar dome. S/p ORIF of L trimalleolar ankle fx and internal fixation of L syndesmosis 2/12. PMH includes history of schizophrenia, chronic neurogenic bladder with a history of self catheterizations, chronic fecal incontinence, history of chronic back pain, prostate cx w/ mets to spine.    PT Comments  Pt received in wheelchair, pt agreeable to transfer back from wheelchair to bed after sitting up in wheelchair for almost 30 mins and prior to this sitting up in recliner for almost 2 hours. Pt needed discussion on plan for working on transfers to prepare for discharge, pt still tangential and not seeming to fully understand discussion, and pt able to recall some cues for wheelchair mobility safety, but tending to keep his LLE resting on floor while attempting to propel chair to move closer to bed, so needing assist to keep LLE elevated throughout and also needing minA to keep LLE from pushing into floor while scooting between surfaces. +2 staff present for safety due to pt confusion and poor awareness of his precautions but only needing physical assist of one person while scooting. Pt continues to benefit from PT services to progress toward functional mobility goals, he remains unsafe with all aspects of functional mobility at this time due to poor insight into LLE weight bearing restrictions and cognitive deficit.    If plan is discharge home, recommend the following: Two people to help with walking and/or transfers;Two people to help with bathing/dressing/bathroom;Direct supervision/assist for medications management;Help  with stairs or ramp for entrance;Assist for transportation;Assistance with cooking/housework;Direct supervision/assist for financial management;Supervision due to cognitive status   Can travel by private vehicle     No  Equipment Recommendations  None recommended by PT    Recommendations for Other Services       Precautions / Restrictions Precautions Precautions: Fall Recall of Precautions/Restrictions: Impaired Precaution/Restrictions Comments: pt is unable to recall or adhere to NWB L LE Required Braces or Orthoses: Other Brace Other Brace: CAM walker OOB L LE Restrictions Weight Bearing Restrictions Per Provider Order: Yes LLE Weight Bearing Per Provider Order: Non weight bearing Other Position/Activity Restrictions: pt states he is no longer NWB status     Mobility  Bed Mobility Overal bed mobility: Needs Assistance Bed Mobility: Sit to Supine       Sit to supine: Contact guard assist, Used rails   General bed mobility comments: Increased time to initiate, CGA to avoid LLE weight bearing on mattress    Transfers Overall transfer level: Needs assistance Equipment used: None Transfers: Bed to chair/wheelchair/BSC            Lateral/Scoot Transfers: Min assist, +2 safety/equipment General transfer comment: Lateral scoot towards R side, requires full support to keep L LE elevated to maintain NWB. Pt using BUE well to lift hips off chair surface, but not listening well to cues to squat and pivot for energy conservation.    Ambulation/Gait               General Gait Details: Pt non-compliant with LLE NWB with transfers so not safe to attempt.   Stairs  Merchant navy officer mobility: Yes Wheelchair propulsion: Both upper extremities Wheelchair parts: Needs assistance Distance: 4 Wheelchair Assistance Details (indicate cue type and reason): short distance at bedside to return to supine, pt instructed on  exiting chair toward his R side, pt able to recall cues for use of brakes being locked prior to transfer, but needs assist to unlock and remove arm rest and some physical assist to prevent him from LLE weight bearing while turning the chair (leg rests off as pt refusing to put legs on them). Pt encouraged him to wheel more in hallway for BUE strengthening but refuses and states "I'm already doing my body weight exercises".   Tilt Bed    Modified Rankin (Stroke Patients Only)       Balance Overall balance assessment: Needs assistance Sitting-balance support: No upper extremity supported, Feet supported Sitting balance-Leahy Scale: Fair Sitting balance - Comments: able to perform chair push-ups in recliner while leaning back against chair surface                                    Communication Communication Communication: No apparent difficulties  Cognition Arousal: Alert Behavior During Therapy: Flat affect   PT - Cognitive impairments: Memory, Attention, Sequencing, Problem solving, Safety/Judgement   Orientation impairments: Time, Situation                   PT - Cognition Comments: Pt is very tangential, poor insight into reason for LLE precautions despite education. Pt agreeable to get back to bed after discussion with mobility specialist and RN, pt hopeful he will get to go to [place] today if he gets into the bed from the chair, but instructed by PTA that it will probably not be today. PTA placed his call bell next to him on L side of his bed, but pt states "I will not need that" and tosses it over side rail of his bed. Following commands: Impaired Following commands impaired: Follows one step commands inconsistently (reluctantly agreeable to mobilize back to bed so he can elevate LLE, follows some commands, ~75%) Pt looking at 3 foam pads that came with him CAM boot and states "The Father, The Son and the Principal Financial Techniques: Verbal cues,  Tactile cues, Visual cues, Gestural cues  Exercises General Exercises - Lower Extremity Long Arc Quad: AROM, Seated, 5 reps, AAROM, Left Straight Leg Raises: AAROM, Left, 5 reps, Supine Other Exercises Other Exercises: pt refusing other than exercises listed above    General Comments General comments (skin integrity, edema, etc.): Pt with LLE CAM boot donned when PTA arrived to the room, boot doffed at end of session (patient not assisting to doff boot) so ice can be placed over his L ankle/heel.      Pertinent Vitals/Pain Pain Assessment Pain Assessment: PAINAD Breathing: normal Negative Vocalization: none Facial Expression: sad, frightened, frown Body Language: tense, distressed pacing, fidgeting Consolability: unable to console, distract or reassure PAINAD Score: 4 Pain Location: pt c/o L heel pain, agreeable to CAM boot being removed so ice can be placed over his heel Pain Descriptors / Indicators: Grimacing, Discomfort, Sore Pain Intervention(s): Limited activity within patient's tolerance, Monitored during session, Repositioned, Ice applied (pt refusing pain meds, leg elevated with heel floated)    Home Living  Prior Function            PT Goals (current goals can now be found in the care plan section) Acute Rehab PT Goals Patient Stated Goal: To be able to walk so I can go home PT Goal Formulation: With patient Time For Goal Achievement: 07/27/23 Progress towards PT goals: Progressing toward goals (limited participation today due to behaviors/not taking his medications)    Frequency    Min 1X/week      PT Plan      Co-evaluation              AM-PAC PT "6 Clicks" Mobility   Outcome Measure  Help needed turning from your back to your side while in a flat bed without using bedrails?: A Little Help needed moving from lying on your back to sitting on the side of a flat bed without using bedrails?: A Little Help needed  moving to and from a bed to a chair (including a wheelchair)?: A Lot Help needed standing up from a chair using your arms (e.g., wheelchair or bedside chair)?: A Lot Help needed to walk in hospital room?: Total Help needed climbing 3-5 steps with a railing? : Total 6 Click Score: 12    End of Session Equipment Utilized During Treatment: Gait belt Activity Tolerance: Patient tolerated treatment well;Patient limited by fatigue;Other (comment) (cognitive deficit limiting mobility progression) Patient left: with call bell/phone within reach;Other (comment);in bed;with bed alarm set (LLE elevated with ice over his heel.) Nurse Communication: Mobility status;Precautions;Other (comment) (pt confusion) PT Visit Diagnosis: Other abnormalities of gait and mobility (R26.89);History of falling (Z91.81);Muscle weakness (generalized) (M62.81)     Time: 1610-9604 PT Time Calculation (min) (ACUTE ONLY): 11 min  Charges:    $Therapeutic Activity: 8-22 mins PT General Charges $$ ACUTE PT VISIT: 1 Visit                     Koi Yarbro P., PTA Acute Rehabilitation Services Secure Chat Preferred 9a-5:30pm Office: [phone]    Dorathy Kinsman [facility] 07/16/2023, 4:10 PM

## 2023-07-16 NOTE — Progress Notes (Signed)
 Occupational Therapy Treatment Patient Details Name: TRYTON BODI MRN: [id number] DOB: 12-30-1955 Today's Date: 07/16/2023   History of present illness 68 y/o male presents to [facility] ED on 2/6 for bilateral leg swelling/pain, redness and swelling noted to L>R. Imaging findings result in trimalleolar fx of the L ankle, mildly displaced distal fibular shaft fx, mild posterior subluxation of talar dome. S/p ORIF of L trimalleolar ankle fx and internal fixation of L syndesmosis 2/12. PMH includes history of schizophrenia, chronic neurogenic bladder with a history of self catheterizations, chronic fecal incontinence, history of chronic back pain, prostate cx w/ mets to spine.   OT comments  Reinforced NWB to L LE, pt reports having CAM boot now and he can walk. Pt hesitant to accept precautions and reports he is going home and can "walk out of here".  Pt perseverates on going home, when questioned pt becomes agitated and stops engaging with therapist.  Requires full support to L LE during session to maintain NWB, cueing to avoid WB when donning R sock and overall needing min assist to lateral scoot transfer, mod assist for LB dressing.  Continue to recommend <3hrs/day inpatient setting at dc. Will follow acutely.       If plan is discharge home, recommend the following:  Assistance with cooking/housework;Direct supervision/assist for medications management;Direct supervision/assist for financial management;Assist for transportation;Help with stairs or ramp for entrance;Two people to help with bathing/dressing/bathroom;Two people to help with walking and/or transfers;Supervision due to cognitive status   Equipment Recommendations  Other (comment) (defer)    Recommendations for Other Services      Precautions / Restrictions Precautions Precautions: Fall Recall of Precautions/Restrictions: Impaired Precaution/Restrictions Comments: pt is unable to recall or adhere to NWB L LE Required Braces or  Orthoses: Other Brace Other Brace: CAM walker OOB L LE Restrictions Weight Bearing Restrictions Per Provider Order: Yes LLE Weight Bearing Per Provider Order: Non weight bearing       Mobility Bed Mobility Overal bed mobility: Needs Assistance Bed Mobility: Supine to Sit     Supine to sit: Contact guard     General bed mobility comments: min guard for safety    Transfers Overall transfer level: Needs assistance Equipment used: Rolling walker (2 wheels) Transfers: Bed to chair/wheelchair/BSC            Lateral/Scoot Transfers: Min assist General transfer comment: lateral scoot towards R side, requires full support to keep L LE elevated to maintain NWB.     Balance Overall balance assessment: Needs assistance Sitting-balance support: No upper extremity supported, Feet supported Sitting balance-Leahy Scale: Fair                                     ADL either performed or assessed with clinical judgement   ADL Overall ADL's : Needs assistance/impaired                     Lower Body Dressing: Moderate assistance;Sitting/lateral leans Lower Body Dressing Details (indicate cue type and reason): able to don R sock but requires cueing to avoid WB through L ankle when crossing R LE.  pt requires assist to pull clohting over hips Toilet Transfer: Minimal assistance Toilet Transfer Details (indicate cue type and reason): lateral scoot simulated to recliner towards R side         Functional mobility during ADLs: Minimal assistance;Cueing for sequencing;Cueing for safety General ADL Comments: requires support  to keep L LE elevated during session    Extremity/Trunk Assessment              Vision       Perception     Praxis     Communication Communication Communication: No apparent difficulties   Cognition Arousal: Alert Behavior During Therapy: Flat affect Cognition: No family/caregiver present to determine baseline     Awareness:  Intellectual awareness intact, Online awareness impaired Memory impairment (select all impairments): Short-term memory, Working memory Attention impairment (select first level of impairment): Sustained attention Executive functioning impairment (select all impairments): Organization, Sequencing, Reasoning, Problem solving OT - Cognition Comments: pt tangential, requires cueing for awareness of precautions. Perseverates on being able to walk on his L foot bc it is "healed" and  reporting he is gonig home today                 Following commands: Impaired Following commands impaired: Follows one step commands with increased time, Follows multi-step commands inconsistently      Cueing   Cueing Techniques: Verbal cues, Tactile cues, Visual cues, Gestural cues  Exercises      Shoulder Instructions       General Comments      Pertinent Vitals/ Pain       Pain Assessment Pain Assessment: No/denies pain  Home Living                                          Prior Functioning/Environment              Frequency  Min 1X/week        Progress Toward Goals  OT Goals(current goals can now be found in the care plan section)  Progress towards OT goals: Progressing toward goals  Acute Rehab OT Goals Patient Stated Goal: home OT Goal Formulation: With patient Time For Goal Achievement: 07/27/23 Potential to Achieve Goals: Fair  Plan      Co-evaluation                 AM-PAC OT "6 Clicks" Daily Activity     Outcome Measure   Help from another person eating meals?: A Little Help from another person taking care of personal grooming?: A Little Help from another person toileting, which includes using toliet, bedpan, or urinal?: Total Help from another person bathing (including washing, rinsing, drying)?: A Lot Help from another person to put on and taking off regular upper body clothing?: A Little Help from another person to put on and taking off  regular lower body clothing?: A Lot 6 Click Score: 14    End of Session Equipment Utilized During Treatment:  (pt refused gait belt)  OT Visit Diagnosis: Unsteadiness on feet (R26.81);Muscle weakness (generalized) (M62.81);Other symptoms and signs involving cognitive function;History of falling (Z91.81)   Activity Tolerance Patient tolerated treatment well   Patient Left with call bell/phone within reach;with chair alarm set;in chair   Nurse Communication Mobility status        Time: 3086-5784 OT Time Calculation (min): 16 min  Charges: OT General Charges $OT Visit: 1 Visit OT Treatments $Self Care/Home Management : 8-22 mins  Barry Brunner, OT Acute Rehabilitation Services Office [phone] Secure Chat Preferred    [name] 07/16/2023, 1:29 PM

## 2023-07-16 NOTE — TOC Progression Note (Signed)
 Transition of Care [facility]) - Progression Note    Patient Details  Name: Timothy [name] MRN: [id number] Date of Birth: 06/20/1955  [facility] CM/SW Contact  Lorri Frederick, LCSW Phone Number: 07/16/2023, 4:00 PM  Clinical Narrative:   CSW spoke with Deborah/[facility], she reviewed the referral.  [facility] been [facility] hold" [facility], but Gavin Pound said she may be able to accept one.  She asked for additional info on family support.  CSW spoke with Ronnie Derby, who provided info.  Pt has been living at home (pt owns his own home) with his brother and Lavern's son has been paid by [place] as a caregiver.  Pt gets all care including psych care through [place] and pt does well when on psych meds.  [name] is also quite involved and is planning to continue, would be able to sign paperwork if needed for SNF.  CSW spoke with Deborah/[facility] again.  She will work on this and also confirm with the [facility] if she is able to be off "hold" status for this admission.    Expected Discharge Plan: Skilled Nursing Facility Barriers to Discharge: Continued Medical Work up, English as a second language teacher, SNF Pending bed offer  Expected Discharge Plan and Services In-house Referral: Clinical Social Work   Post Acute Care Choice: Skilled Nursing Facility Living arrangements for the past 2 months: Single Family Home                                       Social Determinants of Health (SDOH) Interventions SDOH Screenings   Food Insecurity: Patient Declined (06/26/2023)  Housing: Patient Declined (06/26/2023)  Transportation Needs: Patient Declined (06/26/2023)  Utilities: Patient Declined (06/26/2023)  Social Connections: Unknown (06/26/2023)  Tobacco Use: Medium Risk (07/01/2023)    Readmission Risk Interventions     No data to display

## 2023-07-16 NOTE — Progress Notes (Signed)
 PROGRESS NOTE  Timothy [name]  DOB: Nov 13, 1955  PCP: Timothy Maya, MD BJY:[id number]  DOA: 06/25/2023  LOS: 19 days  [name] Day: 22  Brief narrative: Timothy [name] is a 68 y.o. male with PMH significant for prostate cancer with vertebral mets, s/p back surgery, wheelchair-bound status, neurogenic bladder requiring self cath, chronic fecal incontinence, gonorrhea, schizophrenia, PTSD, behavioral disturbance who follows up at [name]. 2/6, patient presented to ED with complaint of bilateral leg swelling He states it has been present for months but became much worse yesterday redness and swelling noted left more than right. Patient was apparently seen in the ED on January 4 for a fall.  He underwent x-ray of left knee at that time which was unremarkable and was discharged.  It seems he did not have ankle x-ray done at that time.  Patient reports pain and swelling since that fall.  In the ED, patient was hemodynamically stable CBC/CMP mostly unremarkable Catheter urinalysis showed hazy yellow color urine with small hemoglobin, moderate leukocytes, positive nitrite, many bacteria UDS negative Chest x-ray negative for any acute infiltrates X-ray left ankle showed acute mildly displaced distal fibular shaft fracture, acute mildly displaced medial malleolar fracture, mild posterior subluxation of the talar dome with respect to the distal tibia. EDP discussed with orthopedics [name]. Admitted to [name] Orthopedic consulted.  2/12, underwent ORIF of left ankle 2/14, started developing paranoia.  Apparently also APS involved on family.  2/18, improving.  Insist on going home however he cannot go home. 2/19, paranoid.  Loss of insight.  Followed by psych. 2/25, does have slight tangential thoughts.  Psych thinks this is his baseline.  Medically stable to transfer to SNF when bed available.  Subjective:  Patient seen and examined.  No new events.  He wants me to give him a cam boot and  will let him walk. "I need to go to [name] " .    Assessment and plan: Left ankle trimalleolar fracture S/p ORIF -2/12 Timothy [name] Secondary to fall 5 weeks ago. Pain regimen: Tylenol as needed, oxycodone as needed.   not using any narcotics. Bowel regimen: MiraLAX as needed Will change to aspirin 325 mg daily. Seen by Ortho.  Repeat x-ray stable.  Cam boot on walking.  Hypokalemia Potassium improved with replacement.    Abnormal urinalysis Neurogenic bladder H/o prostate cancer Abnormal urinalysis likely secondary to chronic self-catheterization  Currently no evidence of infection.  Not on antibiotics Timothy [name] to continue No urinary retention.  On external catheter.     H/o back surgery Wheelchair-bound status.  Difficult disposition.   Acute exacerbation of schizophrenia At home noncompliant to medications. Not sure if he was actually taking Timothy [name], Timothy [name], Timothy [name].  This was prescribed before. Patient was given haloperidol [name] 50 mg on 2/14, 50 mg on 2/17.   Next haloperidol [name] 100 mg on 3/17.   On Timothy [name], gabapentin and Timothy [name].   Added Timothy [name] 5 mg daily at night. Still has some tangential thoughts, however psychiatry advised this is his baseline.   Goals of care   Code Status: Limited: Do not attempt resuscitation (DNR) -DNR-LIMITED -Do Not Intubate/DNI      DVT prophylaxis:  SCDs Start: 07/01/23 1324 SCDs Start: 06/25/23 2303   Antimicrobials: None Fluid: None Consultants: Orthopedics, psychiatry Family Communication: None at beside.   Status: Inpatient Level of care:  Med-Surg   Patient is from: Home Needs to continue in-[name] care: Unsafe discharge planning. Anticipated d/c to: Skilled nursing facility when bed  available .   Diet:  Diet Order             Diet regular Room service appropriate? Yes; Fluid consistency: Thin  Diet effective now                   Scheduled Meds:  acetaminophen  650 mg Oral Q6H   Or    acetaminophen  650 mg Rectal Q6H   aspirin  325 mg Oral Daily   atorvastatin  80 mg Oral Daily   benztropine  0.5 mg Oral Daily   cholecalciferol  1,000 Units Oral Daily   docusate sodium  100 mg Oral BID   gabapentin  300 mg Oral TID   haloperidol  5 mg Oral QHS   [START ON 08/03/2023] haloperidol [name]  100 mg Intramuscular Q30 days   Timothy [name]  100 mg Oral Daily   tamsulosin  0.4 mg Oral Daily    PRN meds: alum & mag hydroxide-simeth, cyclobenzaprine, haloperidol **OR** haloperidol lactate, labetalol, melatonin, metoCLOPramide **OR** metoCLOPramide (REGLAN) injection, ondansetron **OR** ondansetron (ZOFRAN) IV, polyethylene glycol, polyvinyl alcohol   Infusions:     Antimicrobials: Anti-infectives (From admission, onward)    Start     Dose/Rate Route Frequency Ordered Stop   07/01/23 2300  vancomycin (VANCOCIN) IVPB 1000 mg/200 mL premix        1,000 mg 200 mL/hr over 60 Minutes Intravenous  Once 07/01/23 1432 07/02/23 0121   07/01/23 1415  vancomycin (VANCOCIN) IVPB 1000 mg/200 mL premix  Status:  Discontinued        1,000 mg 200 mL/hr over 60 Minutes Intravenous Every 12 hours 07/01/23 1323 07/01/23 1432   07/01/23 1132  vancomycin (VANCOCIN) powder  Status:  Discontinued          As needed 07/01/23 1153 07/01/23 1211   07/01/23 1045  ceFAZolin (ANCEF) IVPB 3g/150 mL premix  Status:  Discontinued        3 g 300 mL/hr over 30 Minutes Intravenous On call to O.R. 07/01/23 0946 07/01/23 1321   07/01/23 1017  clindamycin (CLEOCIN) 900 MG/50ML IVPB       Note to Pharmacy: Camillia Herter A: cabinet override      07/01/23 1017 07/01/23 1127   07/01/23 0851  ceFAZolin (ANCEF) 3-4 GM/150ML-% IVPB  Status:  Discontinued       Note to Pharmacy: Shanda Bumps M: cabinet override      07/01/23 0851 07/01/23 1037   07/01/23 0848  ceFAZolin (ANCEF) 2-4 GM/100ML-% IVPB  Status:  Discontinued       Note to Pharmacy: Shanda Bumps M: cabinet override      07/01/23 0848  07/01/23 0852   06/25/23 1630  cefTRIAXone (ROCEPHIN) 1 g in sodium chloride 0.9 % 100 mL IVPB        1 g 200 mL/hr over 30 Minutes Intravenous  Once 06/25/23 1621 06/25/23 1827       Objective: Vitals:   07/15/23 2031 07/16/23 0914  BP: (!) 115/58 121/69  Pulse: 77 82  Resp: 18 20  Temp: 97.7 F (36.5 C) 98.3 F (36.8 C)  SpO2: 96% 100%    Intake/Output Summary (Last 24 hours) at 07/16/2023 1143 Last data filed at 07/16/2023 0800 Gross per 24 hour  Intake 240 ml  Output 1300 ml  Net -1060 ml   Filed Weights   06/25/23 1430 07/01/23 0936  Weight: 124 kg 123.8 kg   Weight change:  Body mass index is 35.05 kg/m.   Physical Exam:  Alert awake.   Comfortable.  Flat affect but interactive today.  Asking appropriate questions.   Data Review: I have personally reviewed the laboratory data and studies available.  F/u labs  Unresulted Labs (From admission, onward)    None       Total time spent in review of labs and imaging, patient evaluation, formulation of plan, documentation and communication with family: 25 minutes  Signed, Dorcas Carrow, MD [facility] 07/16/2023

## 2023-07-16 NOTE — Progress Notes (Signed)
 Dr. Jerral Ralph made aware that the patient is refusing all of his medications.

## 2023-07-17 DIAGNOSIS — S82852A Displaced trimalleolar fracture of left lower leg, initial encounter for closed fracture: Secondary | ICD-10-CM | POA: Diagnosis not present

## 2023-07-17 NOTE — TOC Progression Note (Signed)
 Transition of Care [facility]) - Progression Note    Patient Details  Name: JEREMI LOSITO MRN: [id number] Date of Birth: [birth year]  [facility] of [facility]) CM/SW Contact  Dellie Burns [place], [place] Phone Number: 07/17/2023, 2:03 PM  Clinical Narrative: Sherron Monday to Gavin Pound at [facility] who reports they have not received a determination from the [place]. If the [facility] denies [facility], Gavin Pound reports they are willing to accept pt under [employer] if the pt has SNF benefits. Deborah to confirm SNF benefits with [facility] and will update SW.   Will follow and provide updates as available.   Dellie Burns, MSW, LCSW [phone] (coverage)      Expected Discharge Plan: Skilled Nursing Facility Barriers to Discharge: Continued Medical Work up, English as a second language teacher, SNF Pending bed offer  Expected Discharge Plan and Services In-house Referral: Clinical Social Work   Post Acute Care Choice: Skilled Nursing Facility Living arrangements for the past 2 months: Single Family Home                                       Social Determinants of Health (SDOH) Interventions SDOH Screenings   Food Insecurity: Patient Declined (06/26/2023)  Housing: Patient Declined (06/26/2023)  Transportation Needs: Patient Declined (06/26/2023)  Utilities: Patient Declined (06/26/2023)  Social Connections: Unknown (06/26/2023)  Tobacco Use: Medium Risk (07/01/2023)    Readmission Risk Interventions     No data to display

## 2023-07-17 NOTE — Progress Notes (Signed)
 PROGRESS NOTE  Timothy [name]  DOB: February 23, 1956  PCP: Wendi Maya, MD MWN:[id number]  DOA: 06/25/2023  LOS: 20 days  Hospital Day: 23  Brief narrative: Timothy [name] is a 68 y.o. male with PMH significant for prostate cancer with vertebral mets, s/p back surgery, wheelchair-bound status, neurogenic bladder requiring self cath, chronic fecal incontinence, gonorrhea, schizophrenia, PTSD, behavioral disturbance who follows up at [facility] [place]. 2/6, patient presented to ED with complaint of bilateral leg swelling He states it has been present for months but became much worse yesterday redness and swelling noted left more than right. Patient was apparently seen in the ED on January 4 for a fall.  He underwent x-ray of left knee at that time which was unremarkable and was discharged.  It seems he did not have ankle x-ray done at that time.  Patient reports pain and swelling since that fall.  In the ED, patient was hemodynamically stable CBC/CMP mostly unremarkable Catheter urinalysis showed hazy yellow color urine with small hemoglobin, moderate leukocytes, positive nitrite, many bacteria UDS negative Chest x-ray negative for any acute infiltrates X-ray left ankle showed acute mildly displaced distal fibular shaft fracture, acute mildly displaced medial malleolar fracture, mild posterior subluxation of the talar dome with respect to the distal tibia. EDP discussed with orthopedics Dr. Hulda Humphrey. Admitted to [facility] Orthopedic consulted.  2/12, underwent ORIF of left ankle 2/14, started developing paranoia.  Apparently also APS involved on family.  2/18, improving.  Insist on going home however he cannot go home. 2/19, paranoid.  Loss of insight.  Followed by psych. 2/25, does have slight tangential thoughts.  Psych thinks this is his baseline.  Medically stable to transfer to SNF when bed available.  Subjective:  No new events . " [facility] am I going to [facility]"    Assessment and plan: Left  ankle trimalleolar fracture S/p ORIF -2/12 Dr. Jena Gauss Secondary to fall 5 weeks ago. Pain regimen: Tylenol as needed, oxycodone as needed.   not using any narcotics. Bowel regimen: MiraLAX as needed Will change to aspirin 325 mg daily. Seen by Ortho.  Repeat x-ray stable.  Cam boot on walking.  Hypokalemia Potassium improved with replacement.    Abnormal urinalysis Neurogenic bladder H/o prostate cancer Abnormal urinalysis likely secondary to chronic self-catheterization  Currently no evidence of infection.  Not on antibiotics Flomax to continue No urinary retention.  On external catheter.     H/o back surgery Wheelchair-bound status.  Difficult disposition.   Acute exacerbation of schizophrenia At home noncompliant to medications. Not sure if he was actually taking Cogentin, Haldol, sertraline.  This was prescribed before. Patient was given haloperidol decanoate 50 mg on 2/14, 50 mg on 2/17.   Next haloperidol decanoate 100 mg on 3/17.   On Cogentin, gabapentin and sertraline.   Added Haldol 5 mg daily at night. Still has some tangential thoughts, however psychiatry advised this is his baseline.   Goals of care   Code Status: Limited: Do not attempt resuscitation (DNR) -DNR-LIMITED -Do Not Intubate/DNI      DVT prophylaxis:  SCDs Start: 07/01/23 1324 SCDs Start: 06/25/23 2303   Antimicrobials: None Fluid: None Consultants: Orthopedics, psychiatry Family Communication: None at beside.   Status: Inpatient Level of care:  Med-Surg   Patient is from: Home Needs to continue in-hospital care: Unsafe discharge planning. Anticipated d/c to: Skilled nursing facility when bed available .   Diet:  Diet Order  Diet regular Room service appropriate? Yes; Fluid consistency: Thin  Diet effective now                   Scheduled Meds:  acetaminophen  650 mg Oral Q6H   Or   acetaminophen  650 mg Rectal Q6H   aspirin  325 mg Oral Daily   atorvastatin   80 mg Oral Daily   benztropine  0.5 mg Oral Daily   cholecalciferol  1,000 Units Oral Daily   docusate sodium  100 mg Oral BID   gabapentin  300 mg Oral TID   haloperidol  5 mg Oral QHS   [START ON 08/03/2023] haloperidol decanoate  100 mg Intramuscular Q30 days   sertraline  100 mg Oral Daily   tamsulosin  0.4 mg Oral Daily    PRN meds: alum & mag hydroxide-simeth, cyclobenzaprine, haloperidol **OR** haloperidol lactate, labetalol, melatonin, metoCLOPramide **OR** metoCLOPramide (REGLAN) injection, ondansetron **OR** ondansetron (ZOFRAN) IV, polyethylene glycol, polyvinyl alcohol   Infusions:     Antimicrobials: Anti-infectives (From admission, onward)    Start     Dose/Rate Route Frequency Ordered Stop   07/01/23 2300  vancomycin (VANCOCIN) IVPB 1000 mg/200 mL premix        1,000 mg 200 mL/hr over 60 Minutes Intravenous  Once 07/01/23 1432 07/02/23 0121   07/01/23 1415  vancomycin (VANCOCIN) IVPB 1000 mg/200 mL premix  Status:  Discontinued        1,000 mg 200 mL/hr over 60 Minutes Intravenous Every 12 hours 07/01/23 1323 07/01/23 1432   07/01/23 1132  vancomycin (VANCOCIN) powder  Status:  Discontinued          As needed 07/01/23 1153 07/01/23 1211   07/01/23 1045  ceFAZolin (ANCEF) IVPB 3g/150 mL premix  Status:  Discontinued        3 g 300 mL/hr over 30 Minutes Intravenous On call to O.R. 07/01/23 0946 07/01/23 1321   07/01/23 1017  clindamycin (CLEOCIN) 900 MG/50ML IVPB       Note to Pharmacy: Camillia Herter A: cabinet override      07/01/23 1017 07/01/23 1127   07/01/23 0851  ceFAZolin (ANCEF) 3-4 GM/150ML-% IVPB  Status:  Discontinued       Note to Pharmacy: Shanda Bumps M: cabinet override      07/01/23 0851 07/01/23 1037   07/01/23 0848  ceFAZolin (ANCEF) 2-4 GM/100ML-% IVPB  Status:  Discontinued       Note to Pharmacy: Shanda Bumps M: cabinet override      07/01/23 0848 07/01/23 0852   06/25/23 1630  cefTRIAXone (ROCEPHIN) 1 g in sodium chloride 0.9 %  100 mL IVPB        1 g 200 mL/hr over 30 Minutes Intravenous  Once 06/25/23 1621 06/25/23 1827       Objective: Vitals:   07/17/23 0430 07/17/23 0856  BP: 121/73 119/74  Pulse: 64 79  Resp: 17 18  Temp: 98.9 F (37.2 C) 97.8 F (36.6 C)  SpO2: 98% 100%    Intake/Output Summary (Last 24 hours) at 07/17/2023 1051 Last data filed at 07/16/2023 1300 Gross per 24 hour  Intake 240 ml  Output --  Net 240 ml   Filed Weights   06/25/23 1430 07/01/23 0936  Weight: 124 kg 123.8 kg   Weight change:  Body mass index is 35.05 kg/m.   Physical Exam:  Alert awake.   Comfortable.  Flat affect but interactive today.  Asking appropriate questions.   Data Review: I have  personally reviewed the laboratory data and studies available.  F/u labs  Unresulted Labs (From admission, onward)    None       Total time spent in review of labs and imaging, patient evaluation, formulation of plan, documentation and communication with family: 25 minutes  Signed, Dorcas Carrow, MD [facility] 07/17/2023

## 2023-07-18 DIAGNOSIS — S82852A Displaced trimalleolar fracture of left lower leg, initial encounter for closed fracture: Secondary | ICD-10-CM | POA: Diagnosis not present

## 2023-07-18 NOTE — Plan of Care (Signed)
 ?  Problem: Coping: ?Goal: Level of anxiety will decrease ?Outcome: Progressing ?  ?Problem: Safety: ?Goal: Ability to remain free from injury will improve ?Outcome: Progressing ?  ?

## 2023-07-18 NOTE — Progress Notes (Signed)
 PROGRESS NOTE  Timothy [name]  DOB: Feb 01, 1956  PCP: Wendi Maya, MD VHQ:[id number]  DOA: 06/25/2023  LOS: 21 days  Hospital Day: 24  Brief narrative: Timothy [name] is a 68 y.o. male with PMH significant for prostate cancer with vertebral mets, s/p back surgery, wheelchair-bound status, neurogenic bladder requiring self cath, chronic fecal incontinence, gonorrhea, schizophrenia, PTSD, behavioral disturbance who follows up at [facility] [place]. 2/6, patient presented to ED with complaint of bilateral leg swelling He states it has been present for months but became much worse yesterday redness and swelling noted left more than right. Patient was apparently seen in the ED on January 4 for a fall.  He underwent x-ray of left knee at that time which was unremarkable and was discharged.  It seems he did not have ankle x-ray done at that time.  Patient reports pain and swelling since that fall.  In the ED, patient was hemodynamically stable CBC/CMP mostly unremarkable Catheter urinalysis showed hazy yellow color urine with small hemoglobin, moderate leukocytes, positive nitrite, many bacteria UDS negative Chest x-ray negative for any acute infiltrates X-ray left ankle showed acute mildly displaced distal fibular shaft fracture, acute mildly displaced medial malleolar fracture, mild posterior subluxation of the talar dome with respect to the distal tibia. EDP discussed with orthopedics Dr. Hulda Humphrey. Admitted to [facility] Orthopedic consulted.  2/12, underwent ORIF of left ankle 2/14, started developing paranoia.  Apparently also APS involved on family.  2/18, improving.  Insist on going home however he cannot go home. 2/19, paranoid.  Loss of insight.  Followed by psych. 2/25, does have slight tangential thoughts.  Psych thinks this is his baseline.  Medically stable to transfer to SNF when bed available.  Subjective:  No new events .  "Help me to walk .  I want to walk."     Assessment and  plan: Left ankle trimalleolar fracture S/p ORIF -2/12 Dr. Jena Gauss Secondary to fall 5 weeks ago. Pain regimen: Tylenol as needed, oxycodone as needed.   not using any narcotics. Bowel regimen: MiraLAX as needed Will change to aspirin 325 mg daily. Seen by Ortho.  Repeat x-ray stable.  Cam boot on walking.  Hypokalemia Potassium improved with replacement.    Abnormal urinalysis Neurogenic bladder H/o prostate cancer Abnormal urinalysis likely secondary to chronic self-catheterization  Currently no evidence of infection.  Not on antibiotics Flomax to continue No urinary retention.  On external catheter.     H/o back surgery Wheelchair-bound status.  Difficult disposition.   Acute exacerbation of schizophrenia At home noncompliant to medications. Not sure if he was actually taking Cogentin, Haldol, sertraline.  This was prescribed before. Patient was given haloperidol decanoate 50 mg on 2/14, 50 mg on 2/17.   Next haloperidol decanoate 100 mg on 3/17.   On Cogentin, gabapentin and sertraline.   Added Haldol 5 mg daily at night. Still has some tangential thoughts, however psychiatry advised this is his baseline.   Goals of care   Code Status: Limited: Do not attempt resuscitation (DNR) -DNR-LIMITED -Do Not Intubate/DNI      DVT prophylaxis:  SCDs Start: 07/01/23 1324 SCDs Start: 06/25/23 2303   Antimicrobials: None Fluid: None Consultants: Orthopedics, psychiatry Family Communication: None at beside.   Status: Inpatient Level of care:  Med-Surg   Patient is from: Home Needs to continue in-hospital care: Unsafe discharge planning. Anticipated d/c to: Skilled nursing facility when bed available .   Diet:  Diet Order  Diet regular Room service appropriate? Yes; Fluid consistency: Thin  Diet effective now                   Scheduled Meds:  acetaminophen  650 mg Oral Q6H   Or   acetaminophen  650 mg Rectal Q6H   aspirin  325 mg Oral Daily    atorvastatin  80 mg Oral Daily   benztropine  0.5 mg Oral Daily   cholecalciferol  1,000 Units Oral Daily   docusate sodium  100 mg Oral BID   gabapentin  300 mg Oral TID   haloperidol  5 mg Oral QHS   [START ON 08/03/2023] haloperidol decanoate  100 mg Intramuscular Q30 days   sertraline  100 mg Oral Daily   tamsulosin  0.4 mg Oral Daily    PRN meds: alum & mag hydroxide-simeth, cyclobenzaprine, haloperidol **OR** haloperidol lactate, labetalol, melatonin, metoCLOPramide **OR** metoCLOPramide (REGLAN) injection, ondansetron **OR** ondansetron (ZOFRAN) IV, polyethylene glycol, polyvinyl alcohol   Infusions:     Antimicrobials: Anti-infectives (From admission, onward)    Start     Dose/Rate Route Frequency Ordered Stop   07/01/23 2300  vancomycin (VANCOCIN) IVPB 1000 mg/200 mL premix        1,000 mg 200 mL/hr over 60 Minutes Intravenous  Once 07/01/23 1432 07/02/23 0121   07/01/23 1415  vancomycin (VANCOCIN) IVPB 1000 mg/200 mL premix  Status:  Discontinued        1,000 mg 200 mL/hr over 60 Minutes Intravenous Every 12 hours 07/01/23 1323 07/01/23 1432   07/01/23 1132  vancomycin (VANCOCIN) powder  Status:  Discontinued          As needed 07/01/23 1153 07/01/23 1211   07/01/23 1045  ceFAZolin (ANCEF) IVPB 3g/150 mL premix  Status:  Discontinued        3 g 300 mL/hr over 30 Minutes Intravenous On call to O.R. 07/01/23 0946 07/01/23 1321   07/01/23 1017  clindamycin (CLEOCIN) 900 MG/50ML IVPB       Note to Pharmacy: Camillia Herter A: cabinet override      07/01/23 1017 07/01/23 1127   07/01/23 0851  ceFAZolin (ANCEF) 3-4 GM/150ML-% IVPB  Status:  Discontinued       Note to Pharmacy: Shanda Bumps M: cabinet override      07/01/23 0851 07/01/23 1037   07/01/23 0848  ceFAZolin (ANCEF) 2-4 GM/100ML-% IVPB  Status:  Discontinued       Note to Pharmacy: Shanda Bumps M: cabinet override      07/01/23 0848 07/01/23 0852   06/25/23 1630  cefTRIAXone (ROCEPHIN) 1 g in sodium  chloride 0.9 % 100 mL IVPB        1 g 200 mL/hr over 30 Minutes Intravenous  Once 06/25/23 1621 06/25/23 1827       Objective: Vitals:   07/18/23 0433 07/18/23 0800  BP: 117/67 126/68  Pulse: 75 78  Resp: 17 16  Temp: 98.6 F (37 C) 98.4 F (36.9 C)  SpO2: 96% 98%    Intake/Output Summary (Last 24 hours) at 07/18/2023 1316 Last data filed at 07/18/2023 0900 Gross per 24 hour  Intake --  Output 1500 ml  Net -1500 ml   Filed Weights   06/25/23 1430 07/01/23 0936  Weight: 124 kg 123.8 kg   Weight change:  Body mass index is 35.05 kg/m.   Physical Exam:  Alert awake.   Comfortable.   Appropriately responding.   Data Review: I have personally reviewed the laboratory data and  studies available.  F/u labs  Unresulted Labs (From admission, onward)    None       Total time spent in review of labs and imaging, patient evaluation, formulation of plan, documentation and communication with family: 25 minutes  Signed, Dorcas Carrow, MD [facility] 07/18/2023

## 2023-07-19 DIAGNOSIS — S82852A Displaced trimalleolar fracture of left lower leg, initial encounter for closed fracture: Secondary | ICD-10-CM | POA: Diagnosis not present

## 2023-07-19 NOTE — Plan of Care (Signed)
   Problem: Clinical Measurements: Goal: Will remain free from infection Outcome: Not Progressing   Problem: Activity: Goal: Risk for activity intolerance will decrease Outcome: Not Progressing   Problem: Coping: Goal: Level of anxiety will decrease Outcome: Not Progressing   Problem: Safety: Goal: Ability to remain free from injury will improve Outcome: Not Progressing   Problem: Skin Integrity: Goal: Risk for impaired skin integrity will decrease Outcome: Not Progressing

## 2023-07-19 NOTE — Plan of Care (Signed)
 ?  Problem: Coping: ?Goal: Level of anxiety will decrease ?Outcome: Progressing ?  ?Problem: Safety: ?Goal: Ability to remain free from injury will improve ?Outcome: Progressing ?  ?

## 2023-07-19 NOTE — Progress Notes (Signed)
 PROGRESS NOTE  Timothy [name]  DOB: 09-19-1955  PCP: Wendi Maya, MD ZOX:[id number]  DOA: 06/25/2023  LOS: 22 days  Hospital Day: 25  Brief narrative: Timothy [name] is a 68 y.o. male with PMH significant for prostate cancer with vertebral mets, s/p back surgery, wheelchair-bound status, neurogenic bladder requiring self cath, chronic fecal incontinence, gonorrhea, schizophrenia, PTSD, behavioral disturbance who follows up at [facility]. 2/6, patient presented to ED with complaint of bilateral leg swelling He states it has been present for months but became much worse yesterday redness and swelling noted left more than right. Patient was apparently seen in the ED on January 4 for a fall.  He underwent x-ray of left knee at that time which was unremarkable and was discharged.  It seems he did not have ankle x-ray done at that time.  Patient reports pain and swelling since that fall.  In the ED, patient was hemodynamically stable CBC/CMP mostly unremarkable Catheter urinalysis showed hazy yellow color urine with small hemoglobin, moderate leukocytes, positive nitrite, many bacteria UDS negative Chest x-ray negative for any acute infiltrates X-ray left ankle showed acute mildly displaced distal fibular shaft fracture, acute mildly displaced medial malleolar fracture, mild posterior subluxation of the talar dome with respect to the distal tibia. EDP discussed with orthopedics Dr. Hulda Humphrey. Admitted to [facility] Orthopedic consulted.  2/12, underwent ORIF of left ankle 2/14, started developing paranoia.  Apparently also APS involved on family.  2/18, improving.  Insist on going home however he cannot go home. 2/19, paranoid.  Loss of insight.  Followed by psych. 2/25, does have slight tangential thoughts.  Psych thinks this is his baseline.  Medically stable to transfer to SNF when bed available.  Subjective:  No new events.  Occasional tangential thoughts remain.   Assessment and  plan: Left ankle trimalleolar fracture S/p ORIF -2/12 Dr. Jena Gauss Secondary to fall 5 weeks ago. Pain regimen: Tylenol as needed, oxycodone as needed.   not using any narcotics. Bowel regimen: MiraLAX as needed Will change to aspirin 325 mg daily. Seen by Ortho.  Repeat x-ray stable.  Cam boot on walking.  Hypokalemia Potassium improved with replacement.    Abnormal urinalysis Neurogenic bladder H/o prostate cancer Abnormal urinalysis likely secondary to chronic self-catheterization  Currently no evidence of infection.  Not on antibiotics Flomax to continue No urinary retention.  On external catheter.     H/o back surgery Wheelchair-bound status.  Difficult disposition.   Acute exacerbation of schizophrenia At home noncompliant to medications. Not sure if he was actually taking Cogentin, Haldol, sertraline.  This was prescribed before. Patient was given haloperidol decanoate 50 mg on 2/14, 50 mg on 2/17.   Next haloperidol decanoate 100 mg on 3/17.   On Cogentin, gabapentin and sertraline.   Added Haldol 5 mg daily at night. Still has some tangential thoughts, however psychiatry advised this is his baseline.   Goals of care   Code Status: Limited: Do not attempt resuscitation (DNR) -DNR-LIMITED -Do Not Intubate/DNI      DVT prophylaxis:  SCDs Start: 07/01/23 1324 SCDs Start: 06/25/23 2303   Antimicrobials: None Fluid: None Consultants: Orthopedics, psychiatry Family Communication: None at beside.   Status: Inpatient Level of care:  Med-Surg   Patient is from: Home Needs to continue in-hospital care: Unsafe discharge planning. Anticipated d/c to: Skilled nursing facility when bed available .   Diet:  Diet Order             Diet regular Room service  appropriate? Yes; Fluid consistency: Thin  Diet effective now                   Scheduled Meds:  acetaminophen  650 mg Oral Q6H   Or   acetaminophen  650 mg Rectal Q6H   aspirin  325 mg Oral Daily    atorvastatin  80 mg Oral Daily   benztropine  0.5 mg Oral Daily   cholecalciferol  1,000 Units Oral Daily   docusate sodium  100 mg Oral BID   gabapentin  300 mg Oral TID   haloperidol  5 mg Oral QHS   [START ON 08/03/2023] haloperidol decanoate  100 mg Intramuscular Q30 days   sertraline  100 mg Oral Daily   tamsulosin  0.4 mg Oral Daily    PRN meds: alum & mag hydroxide-simeth, cyclobenzaprine, haloperidol **OR** haloperidol lactate, labetalol, melatonin, metoCLOPramide **OR** metoCLOPramide (REGLAN) injection, ondansetron **OR** ondansetron (ZOFRAN) IV, polyethylene glycol, polyvinyl alcohol   Infusions:     Antimicrobials: Anti-infectives (From admission, onward)    Start     Dose/Rate Route Frequency Ordered Stop   07/01/23 2300  vancomycin (VANCOCIN) IVPB 1000 mg/200 mL premix        1,000 mg 200 mL/hr over 60 Minutes Intravenous  Once 07/01/23 1432 07/02/23 0121   07/01/23 1415  vancomycin (VANCOCIN) IVPB 1000 mg/200 mL premix  Status:  Discontinued        1,000 mg 200 mL/hr over 60 Minutes Intravenous Every 12 hours 07/01/23 1323 07/01/23 1432   07/01/23 1132  vancomycin (VANCOCIN) powder  Status:  Discontinued          As needed 07/01/23 1153 07/01/23 1211   07/01/23 1045  ceFAZolin (ANCEF) IVPB 3g/150 mL premix  Status:  Discontinued        3 g 300 mL/hr over 30 Minutes Intravenous On call to O.R. 07/01/23 0946 07/01/23 1321   07/01/23 1017  clindamycin (CLEOCIN) 900 MG/50ML IVPB       Note to Pharmacy: Camillia Herter A: cabinet override      07/01/23 1017 07/01/23 1127   07/01/23 0851  ceFAZolin (ANCEF) 3-4 GM/150ML-% IVPB  Status:  Discontinued       Note to Pharmacy: Shanda Bumps M: cabinet override      07/01/23 0851 07/01/23 1037   07/01/23 0848  ceFAZolin (ANCEF) 2-4 GM/100ML-% IVPB  Status:  Discontinued       Note to Pharmacy: Shanda Bumps M: cabinet override      07/01/23 0848 07/01/23 0852   06/25/23 1630  cefTRIAXone (ROCEPHIN) 1 g in sodium  chloride 0.9 % 100 mL IVPB        1 g 200 mL/hr over 30 Minutes Intravenous  Once 06/25/23 1621 06/25/23 1827       Objective: Vitals:   07/19/23 0502 07/19/23 0812  BP: 112/60 120/73  Pulse: 66 82  Resp: 18   Temp: 99 F (37.2 C) (!) 97.5 F (36.4 C)  SpO2: 97% 98%    Intake/Output Summary (Last 24 hours) at 07/19/2023 1132 Last data filed at 07/19/2023 0900 Gross per 24 hour  Intake 977 ml  Output 2050 ml  Net -1073 ml   Filed Weights   06/25/23 1430 07/01/23 0936  Weight: 124 kg 123.8 kg   Weight change:  Body mass index is 35.05 kg/m.   Physical Exam:  Alert awake.   Comfortable.   Appropriately responding. Sometimes he talks that difficult to understand but he is easily reoriented.  Data Review: I have personally reviewed the laboratory data and studies available.  F/u labs  Unresulted Labs (From admission, onward)    None       Total time spent in review of labs and imaging, patient evaluation, formulation of plan, documentation and communication with family: 25 minutes  Signed, Dorcas Carrow, MD [facility] 07/19/2023

## 2023-07-20 DIAGNOSIS — S82852A Displaced trimalleolar fracture of left lower leg, initial encounter for closed fracture: Secondary | ICD-10-CM | POA: Diagnosis not present

## 2023-07-20 NOTE — TOC Progression Note (Addendum)
 Transition of Care [facility]) - Progression Note    Patient Details  Name: Timothy [name] MRN: [id number] Date of Birth: 07-Aug-1955  Transition of [facility]) CM/SW Contact  Lorri Frederick, LCSW Phone Number: 07/20/2023, 2:27 PM  Clinical Narrative:   Email from [facility]: she is anticipating a decision on SNF approval today.  Will reach out when it arrives.  1215: CSW messages with Deborah/[facility].  She confirmed she can offer bed once approved.  She is also checking devoted health benefits as a possibility if [facility] does not approve.  1415: TC Deborah: she was notified [facility] has approved for 32 days.  She had questions about DSS guardian, discussed that Camillie Katrinka Blazing is Management consultant but not guardian.  TC Camillie: updated her that pt has been accepted at [facility]  Camillie can sign admission paperwork.  Gavin Pound is aware.   1600: Email from Wonda Cerise confirming pt SNF auth request has been approved for [facility].     Expected Discharge Plan: Skilled Nursing Facility Barriers to Discharge: Continued Medical Work up, English as a second language teacher, SNF Pending bed offer  Expected Discharge Plan and Services In-house Referral: Clinical Social Work   Post Acute Care Choice: Skilled Nursing Facility Living arrangements for the past 2 months: Single Family Home                                       Social Determinants of Health (SDOH) Interventions SDOH Screenings   Food Insecurity: Patient Declined (06/26/2023)  Housing: Patient Declined (06/26/2023)  Transportation Needs: Patient Declined (06/26/2023)  Utilities: Patient Declined (06/26/2023)  Social Connections: Unknown (06/26/2023)  Tobacco Use: Medium Risk (07/01/2023)    Readmission Risk Interventions     No data to display

## 2023-07-20 NOTE — Progress Notes (Signed)
 PROGRESS NOTE  [place]  DOB: 11/05/1955  PCP: Wendi Maya, MD ZOX:[id number]  DOA: 06/25/2023  LOS: 23 days  Hospital Day: 26  Brief narrative: Timothy [name] is a 68 y.o. male with PMH significant for prostate cancer with vertebral mets, s/p back surgery, wheelchair-bound status, neurogenic bladder requiring self cath, chronic fecal incontinence, gonorrhea, schizophrenia, PTSD, behavioral disturbance who follows up at [facility]. 2/6, patient presented to ED with complaint of bilateral leg swelling He states it has been present for months but became much worse yesterday redness and swelling noted left more than right. Patient was apparently seen in the ED on January 4 for a fall.  He underwent x-ray of left knee at that time which was unremarkable and was discharged.  It seems he did not have ankle x-ray done at that time.  Patient reports pain and swelling since that fall.  In the ED, patient was hemodynamically stable CBC/CMP mostly unremarkable Catheter urinalysis showed hazy yellow color urine with small hemoglobin, moderate leukocytes, positive nitrite, many bacteria UDS negative Chest x-ray negative for any acute infiltrates X-ray left ankle showed acute mildly displaced distal fibular shaft fracture, acute mildly displaced medial malleolar fracture, mild posterior subluxation of the talar dome with respect to the distal tibia. EDP discussed with orthopedics Dr. Hulda Humphrey. Admitted to [facility] Orthopedic consulted.  2/12, underwent ORIF of left ankle 2/14, started developing paranoia.  Apparently also APS involved on family.  2/18, improving.  Insist on going home however he cannot go home. 2/19, paranoid.  Loss of insight.  Followed by psych. 2/25, does have slight tangential thoughts.  Psych thinks this is his baseline.  Medically stable to transfer to SNF when bed available.  Subjective:  No new events.  Wants to go home.   Assessment and plan: Left ankle  trimalleolar fracture S/p ORIF -2/12 Dr. Jena Gauss Secondary to fall 5 weeks ago. Pain regimen: Tylenol as needed, oxycodone as needed.   not using any narcotics. Bowel regimen: MiraLAX as needed Will change to aspirin 325 mg daily. Seen by Ortho.  Repeat x-ray stable.  Cam boot on walking.  Hypokalemia Potassium improved with replacement.    Abnormal urinalysis Neurogenic bladder H/o prostate cancer Abnormal urinalysis likely secondary to chronic self-catheterization  Currently no evidence of infection.  Not on antibiotics Flomax to continue No urinary retention.  On external catheter.     H/o back surgery Wheelchair-bound status.  Difficult disposition.   Acute exacerbation of schizophrenia At home noncompliant to medications. Not sure if he was actually taking Cogentin, Haldol, sertraline.  This was prescribed before. Patient was given haloperidol decanoate 50 mg on 2/14, 50 mg on 2/17.   Next haloperidol decanoate 100 mg on 3/17.   On Cogentin, gabapentin and sertraline.   Added Haldol 5 mg daily at night. Still has some tangential thoughts, however psychiatry advised this is his baseline.   Goals of care   Code Status: Limited: Do not attempt resuscitation (DNR) -DNR-LIMITED -Do Not Intubate/DNI      DVT prophylaxis:  SCDs Start: 07/01/23 1324 SCDs Start: 06/25/23 2303   Antimicrobials: None Fluid: None Consultants: Orthopedics, psychiatry Family Communication: None at beside.   Status: Inpatient Level of care:  Med-Surg   Patient is from: Home Needs to continue in-hospital care: Unsafe discharge planning. Anticipated d/c to: Skilled nursing facility when bed available .   Diet:  Diet Order             Diet regular Room service  appropriate? Yes; Fluid consistency: Thin  Diet effective now                   Scheduled Meds:  acetaminophen  650 mg Oral Q6H   Or   acetaminophen  650 mg Rectal Q6H   aspirin  325 mg Oral Daily   atorvastatin  80 mg  Oral Daily   benztropine  0.5 mg Oral Daily   cholecalciferol  1,000 Units Oral Daily   docusate sodium  100 mg Oral BID   gabapentin  300 mg Oral TID   haloperidol  5 mg Oral QHS   [START ON 08/03/2023] haloperidol decanoate  100 mg Intramuscular Q30 days   sertraline  100 mg Oral Daily   tamsulosin  0.4 mg Oral Daily    PRN meds: alum & mag hydroxide-simeth, cyclobenzaprine, haloperidol **OR** haloperidol lactate, labetalol, melatonin, metoCLOPramide **OR** metoCLOPramide (REGLAN) injection, ondansetron **OR** ondansetron (ZOFRAN) IV, polyethylene glycol, polyvinyl alcohol   Infusions:     Antimicrobials: Anti-infectives (From admission, onward)    Start     Dose/Rate Route Frequency Ordered Stop   07/01/23 2300  vancomycin (VANCOCIN) IVPB 1000 mg/200 mL premix        1,000 mg 200 mL/hr over 60 Minutes Intravenous  Once 07/01/23 1432 07/02/23 0121   07/01/23 1415  vancomycin (VANCOCIN) IVPB 1000 mg/200 mL premix  Status:  Discontinued        1,000 mg 200 mL/hr over 60 Minutes Intravenous Every 12 hours 07/01/23 1323 07/01/23 1432   07/01/23 1132  vancomycin (VANCOCIN) powder  Status:  Discontinued          As needed 07/01/23 1153 07/01/23 1211   07/01/23 1045  ceFAZolin (ANCEF) IVPB 3g/150 mL premix  Status:  Discontinued        3 g 300 mL/hr over 30 Minutes Intravenous On call to O.R. 07/01/23 0946 07/01/23 1321   07/01/23 1017  clindamycin (CLEOCIN) 900 MG/50ML IVPB       Note to Pharmacy: Camillia Herter A: cabinet override      07/01/23 1017 07/01/23 1127   07/01/23 0851  ceFAZolin (ANCEF) 3-4 GM/150ML-% IVPB  Status:  Discontinued       Note to Pharmacy: Shanda Bumps M: cabinet override      07/01/23 0851 07/01/23 1037   07/01/23 0848  ceFAZolin (ANCEF) 2-4 GM/100ML-% IVPB  Status:  Discontinued       Note to Pharmacy: Shanda Bumps M: cabinet override      07/01/23 0848 07/01/23 0852   06/25/23 1630  cefTRIAXone (ROCEPHIN) 1 g in sodium chloride 0.9 % 100 mL  IVPB        1 g 200 mL/hr over 30 Minutes Intravenous  Once 06/25/23 1621 06/25/23 1827       Objective: Vitals:   07/20/23 0447 07/20/23 0710  BP: 119/73 127/70  Pulse: (!) 59 73  Resp: 15   Temp: 98.3 F (36.8 C) 97.8 F (36.6 C)  SpO2: 100% 97%    Intake/Output Summary (Last 24 hours) at 07/20/2023 1339 Last data filed at 07/20/2023 0936 Gross per 24 hour  Intake 240 ml  Output 3000 ml  Net -2760 ml   Filed Weights   06/25/23 1430 07/01/23 0936  Weight: 124 kg 123.8 kg   Weight change:  Body mass index is 35.05 kg/m.   Physical Exam:  Alert awake.  Looks fairly comfortable. Bilateral clear air entry on lung exams. Appropriately responding. Sometimes he talks that difficult to understand but  he is easily reoriented.   Data Review: I have personally reviewed the laboratory data and studies available.  F/u labs  Unresulted Labs (From admission, onward)    None       Total time spent in review of labs and imaging, patient evaluation, formulation of plan, documentation and communication with family: 25 minutes  Signed, Dorcas Carrow, MD [facility] 07/20/2023

## 2023-07-20 NOTE — Progress Notes (Signed)
 PT Cancellation Note  Patient Details Name: Timothy [name] MRN: [id number] DOB: January 09, 1956   Cancelled Treatment:    Reason Eval/Treat Not Completed: Other (comment) Needed to move bowels as we began session; Assisted onto bedpan;   Will follow up later today as time allows;  Otherwise, will follow up for PT tomorrow;   Thank you,  Van Clines, PT  Acute Rehabilitation Services Office [phone]    Levi Aland 07/20/2023, 1:14 PM

## 2023-07-21 DIAGNOSIS — S82852A Displaced trimalleolar fracture of left lower leg, initial encounter for closed fracture: Secondary | ICD-10-CM | POA: Diagnosis not present

## 2023-07-21 MED ORDER — GABAPENTIN 300 MG PO CAPS: 300.0000 mg | ORAL_CAPSULE | Freq: Three times a day (TID) | ORAL | 1 refills | Status: AC

## 2023-07-21 MED ORDER — DOCUSATE SODIUM 100 MG PO CAPS: 100.0000 mg | ORAL_CAPSULE | Freq: Two times a day (BID) | ORAL | Status: AC

## 2023-07-21 MED ORDER — HALOPERIDOL DECANOATE 100 MG/ML IM SOLN: 100.0000 mg | INTRAMUSCULAR | Status: AC

## 2023-07-21 MED ORDER — HALOPERIDOL 5 MG PO TABS: 5.0000 mg | ORAL_TABLET | Freq: Every day | ORAL | 0 refills | Status: AC

## 2023-07-21 NOTE — Discharge Summary (Signed)
 Physician Discharge Summary  Timothy [name] ZOX:[id number] DOB: 1956/02/24 DOA: 06/25/2023  PCP: Wendi Maya, MD  Admit date: 06/25/2023 Discharge date: 07/22/2023  Admitted From: Home Disposition: Skilled nursing facility  Recommendations for Outpatient Follow-up:  Follow up with PCP in 1-2 weeks Please obtain BMP/CBC in one week Schedule follow-up with psychiatry  Home Health: N/A Equipment/Devices: N/A  Discharge Condition: Stable CODE STATUS: DNR with limited intervention Diet recommendation: Regular diet  Discharge summary: Timothy [name] is a 68 y.o. male with PMH significant for prostate cancer with vertebral mets, s/p back surgery, wheelchair-bound status, neurogenic bladder requiring self cath, chronic fecal incontinence, gonorrhea, schizophrenia, PTSD, behavioral disturbance who follows up at [facility]. 2/6, patient presented to ED with complaint of bilateral leg swelling He states it has been present for months but became much worse yesterday redness and swelling noted left more than right. Patient was apparently seen in the ED on January 4 for a fall.  He underwent x-ray of left knee at that time which was unremarkable and was discharged.  It seems he did not have ankle x-ray done at that time.  Patient reported pain and swelling since that fall.   In the ED, patient was hemodynamically stable. CBC/CMP mostly unremarkable Catheter urinalysis showed hazy yellow color urine with small hemoglobin, moderate leukocytes, positive nitrite, many bacteria. UDS negative Chest x-ray negative for any acute infiltrates X-ray left ankle showed acute mildly displaced distal fibular shaft fracture, acute mildly displaced medial malleolar fracture, mild posterior subluxation of the talar dome with respect to the distal tibia. 2/12, underwent ORIF of left ankle 2/14, started developing paranoia.  Apparently also APS involved on family.  2/18, improving.  Insist on going home however he  cannot go home. 2/19, paranoid.  Loss of insight.  Followed by psych. 2/25, does have slight tangential thoughts.  Psych thinks this is his baseline.  Medically stable to transfer to SNF when bed available. 3/4, able to secure a SNF bed today.  Stable for discharge.  Treated for following conditions.   Assessment and plan: Left ankle trimalleolar fracture S/p ORIF -2/12 Dr. Jena Gauss Secondary to fall 5 weeks ago. Pain regimen: Tylenol as needed, oxycodone as needed.   not using any narcotics. Bowel regimen: MiraLAX as needed Aspirin 325 mg daily for 4 weeks. Seen by Ortho.  Repeat x-ray stable.  Cam boot on walking.  Orthopedics to schedule outpatient follow-up.  Abnormal urinalysis Neurogenic bladder H/o prostate cancer Abnormal urinalysis likely secondary to chronic self-catheterization  Currently no evidence of infection.  Not on antibiotics Flomax to continue No urinary retention.  On external catheter.     Acute exacerbation of schizophrenia At home noncompliant to medications. Not sure if he was actually taking Cogentin, Haldol, sertraline.  This was prescribed before. Patient was given haloperidol decanoate 50 mg on 2/14, 50 mg on 2/17.   Next haloperidol decanoate 100 mg on 3/17.   On Cogentin, gabapentin and sertraline.   Added Haldol 5 mg daily at night. Still has some tangential thoughts, however psychiatry advised this is his baseline.  Will need outpatient follow-up with psychiatry.   Goals of care   Code Status: Limited: Do not attempt resuscitation (DNR) -DNR-LIMITED -Do Not Intubate/DNI     Medically stable for discharge.  Ensure psychiatry follow-up at rehab.  Discharge Diagnoses:  Principal Problem:   Trimalleolar fracture of ankle, closed, left, initial encounter Active Problems:   Schizophrenia [facility])   Self-catheterizes urinary bladder   Neurogenic bladder  Essential (primary) hypertension   Fecal incontinence   DNR (do not resuscitate)/DNI(Do not  intubate)   Obesity, Class II, BMI 35-39.9    Discharge Instructions  Discharge Instructions     Diet general   Complete by: As directed    Increase activity slowly   Complete by: As directed       Allergies as of 07/22/2023       Reactions   Penicillins Anaphylaxis   Has patient had a PCN reaction causing immediate rash, facial/tongue/throat swelling, SOB or lightheadedness with hypotension: Yes Has patient had a PCN reaction causing severe rash involving mucus membranes or skin necrosis: No Has patient had a PCN reaction that required hospitalization: Yes Has patient had a PCN reaction occurring within the last 10 years: No If all of the above answers are "NO", then may proceed with Cephalosporin use.   Tamsulosin Other (See Comments)   Weakness, fatigue        Medication List     STOP taking these medications    cephALEXin 500 MG capsule Commonly known as: KEFLEX   cetirizine 10 MG tablet Commonly known as: ZyrTEC Allergy   chlorhexidine 0.12 % solution Commonly known as: PERIDEX   diclofenac 50 MG EC tablet Commonly known as: VOLTAREN   diclofenac Sodium 1 % Gel Commonly known as: VOLTAREN   diphenhydrAMINE 25 mg capsule Commonly known as: BENADRYL   Edex 40 MCG injection Generic drug: alprostadil   fluticasone 50 MCG/ACT nasal spray Commonly known as: FLONASE   furosemide 20 MG tablet Commonly known as: LASIX   haloperidol decanoate 50 MG/ML injection Commonly known as: HALDOL DECANOATE Replaced by: haloperidol decanoate 100 MG/ML injection   lactulose 10 GM/15ML solution Commonly known as: CHRONULAC   levofloxacin 500 MG tablet Commonly known as: LEVAQUIN   lidocaine 5 % Commonly known as: LIDODERM   lisinopril 20 MG tablet Commonly known as: ZESTRIL   mupirocin cream 2 % Commonly known as: BACTROBAN   phenazopyridine 200 MG tablet Commonly known as: PYRIDIUM   polycarbophil 625 MG tablet Commonly known as: FIBERCON    sildenafil 100 MG tablet Commonly known as: VIAGRA       TAKE these medications    acetaminophen 325 MG tablet Commonly known as: TYLENOL Take 650 mg by mouth every 6 (six) hours as needed for mild pain (pain score 1-3) or moderate pain (pain score 4-6).   aspirin EC 325 MG tablet Take 1 tablet (325 mg total) by mouth daily.   atorvastatin 80 MG tablet Commonly known as: LIPITOR Take 80 mg by mouth daily.   benztropine 0.5 MG tablet Commonly known as: COGENTIN Take 0.5 mg by mouth daily.   bisacodyl 5 MG EC tablet Commonly known as: DULCOLAX Take 5 mg by mouth daily as needed for moderate constipation.   Calcium-Vitamin D 500-400 MG-UNIT Tabs Take 1 tablet by mouth daily.   carboxymethylcellulose 0.5 % Soln Commonly known as: REFRESH PLUS Place 1 drop into both eyes 4 (four) times daily.   Cholecalciferol 25 MCG (1000 UT) Tbdp Take 1,000 Units by mouth daily.   cyclobenzaprine 10 MG tablet Commonly known as: FLEXERIL Take 1 tablet (10 mg total) by mouth 2 (two) times daily as needed for muscle spasms.   docusate sodium 100 MG capsule Commonly known as: COLACE Take 1 capsule (100 mg total) by mouth 2 (two) times daily.   gabapentin 300 MG capsule Commonly known as: Neurontin Take 1 capsule (300 mg total) by mouth 3 (three) times  daily.   haloperidol 5 MG tablet Commonly known as: HALDOL Take 1 tablet (5 mg total) by mouth at bedtime.   haloperidol decanoate 100 MG/ML injection Commonly known as: HALDOL DECANOATE Inject 1 mL (100 mg total) into the muscle every 30 (thirty) days. Start taking on: August 03, 2023 Replaces: haloperidol decanoate 50 MG/ML injection   hydroxypropyl methylcellulose / hypromellose 2.5 % ophthalmic solution Commonly known as: ISOPTO TEARS / GONIOVISC Place 1 drop into both eyes 3 (three) times daily as needed for dry eyes.   omeprazole 20 MG capsule Commonly known as: PRILOSEC Take 40 mg by mouth 2 (two) times daily before a  meal.   oxybutynin 5 MG tablet Commonly known as: DITROPAN Take 5 mg by mouth 3 (three) times daily as needed for bladder spasms.   Oxycodone HCl 10 MG Tabs Take 1 tablet (10 mg total) by mouth every 4 (four) hours as needed for severe pain (pain score 7-10). What changed:  medication strength how much to take when to take this reasons to take this   polyethylene glycol 17 g packet Commonly known as: MIRALAX / GLYCOLAX Take 17 g by mouth daily as needed for mild constipation.   sertraline 100 MG tablet Commonly known as: ZOLOFT Take 100 mg by mouth daily.   tamsulosin 0.4 MG Caps capsule Commonly known as: FLOMAX Take 0.4 mg by mouth daily after supper.        Follow-up Information     Haddix, Gillie Manners, MD. Schedule an appointment as soon as possible for a visit in 2 week(s).   Specialty: Orthopedic Surgery Why: for splint/suture removal, repeat x-rays Contact information: [address] [place] [address] [phone]                Allergies  Allergen Reactions   Penicillins Anaphylaxis    Has patient had a PCN reaction causing immediate rash, facial/tongue/throat swelling, SOB or lightheadedness with hypotension: Yes Has patient had a PCN reaction causing severe rash involving mucus membranes or skin necrosis: No Has patient had a PCN reaction that required hospitalization: Yes Has patient had a PCN reaction occurring within the last 10 years: No If all of the above answers are "NO", then may proceed with Cephalosporin use.    Tamsulosin Other (See Comments)    Weakness, fatigue    Consultations: Psychiatry Orthopedics   Procedures/Studies: DG Ankle Complete Left Result Date: 07/15/2023 CLINICAL DATA:  Left ankle fracture. EXAM: LEFT ANKLE COMPLETE - 3+ VIEW COMPARISON:  July 01, 2023. FINDINGS: Left ankle is been casted and immobilized. Status post surgical internal fixation of distal left fibular and tibial fractures. Stable appearance  of fractures is noted. No dislocation is noted. IMPRESSION: Stable appearance of distal left fibular and tibial fractures. Electronically Signed   By: Lupita Raider M.D.   On: 07/15/2023 11:45   DG Ankle Complete Left Result Date: 07/01/2023 CLINICAL DATA:  Postop in PACU. EXAM: LEFT ANKLE COMPLETE - 3+ VIEW COMPARISON:  Preoperative imaging FINDINGS: Lateral plate and multi screw fixation as well as syndesmotic screw traverse distal fibular fracture. Improved fracture alignment from preoperative imaging. Two screws traverse the medial malleolar fracture. Improved fracture alignment with mild residual offset. Overlying cast/splint material limits detailed osseous and soft tissue assessment. IMPRESSION: ORIF of distal fibular and medial malleolar fractures with improved fracture alignment. Electronically Signed   By: Narda Rutherford M.D.   On: 07/01/2023 15:54   DG Ankle Complete Left Result Date: 07/01/2023 CLINICAL DATA:  Elective surgery. EXAM: LEFT ANKLE COMPLETE - 3+ VIEW COMPARISON:  Preoperative imaging FINDINGS: Five fluoroscopic spot views of the left ankle submitted from the operating room. Lateral plate and screw fixation of distal fibular fracture with syndesmotic screw. Two screws traverse medial malleolar fracture. Fluoroscopy time 69.7 seconds. Dose 2.71 mGy. IMPRESSION: Procedural fluoroscopy during left ankle fracture fixation. Electronically Signed   By: Narda Rutherford M.D.   On: 07/01/2023 12:40   DG C-Arm 1-60 Min-No Report Result Date: 07/01/2023 Fluoroscopy was utilized by the requesting physician.  No radiographic interpretation.   VAS [place] LOWER EXTREMITY VENOUS (DVT) (7a-7p) Result Date: 06/26/2023  Lower Venous DVT Study Patient Name:  KALIEF KATTNER  Date of Exam:   06/25/2023 Medical Rec #: [id number]         Accession #:    [id number] Date of Birth: 1956-01-02         Patient Gender: M Patient Age:   24 years Exam Location:  [facility] Procedure:      VAS [place] LOWER  EXTREMITY VENOUS (DVT) Referring Phys: Fayrene Fearing [place] --------------------------------------------------------------------------------  Indications: Swelling, and Edema.  Comparison Study: Previous study on 5.7.2024. Performing Technologist: Fernande Bras  Examination Guidelines: A complete evaluation includes B-mode imaging, spectral Doppler, color Doppler, and power Doppler as needed of all accessible portions of each vessel. Bilateral testing is considered an integral part of a complete examination. Limited examinations for reoccurring indications may be performed as noted. The reflux portion of the exam is performed with the patient in reverse Trendelenburg.  +-----+---------------+---------+-----------+----------+--------------+ RIGHTCompressibilityPhasicitySpontaneityPropertiesThrombus Aging +-----+---------------+---------+-----------+----------+--------------+ CFV  Full           Yes      Yes                                 +-----+---------------+---------+-----------+----------+--------------+ SFJ  Full           Yes      Yes                                 +-----+---------------+---------+-----------+----------+--------------+   +---------+---------------+---------+-----------+----------+-------------------+ LEFT     CompressibilityPhasicitySpontaneityPropertiesThrombus Aging      +---------+---------------+---------+-----------+----------+-------------------+ CFV      Full           Yes      Yes                                      +---------+---------------+---------+-----------+----------+-------------------+ SFJ      Full           Yes      Yes                                      +---------+---------------+---------+-----------+----------+-------------------+ FV Prox  Full                                                             +---------+---------------+---------+-----------+----------+-------------------+ FV Mid   Full                                                              +---------+---------------+---------+-----------+----------+-------------------+  FV DistalFull                                                             +---------+---------------+---------+-----------+----------+-------------------+ PFV      Full                                                             +---------+---------------+---------+-----------+----------+-------------------+ POP      Full           Yes      Yes                                      +---------+---------------+---------+-----------+----------+-------------------+ PTV                                                   Limited, patent by                                                        color.              +---------+---------------+---------+-----------+----------+-------------------+ PERO                                                  Limited, patent by                                                        color.              +---------+---------------+---------+-----------+----------+-------------------+     Summary: RIGHT: - No evidence of common femoral vein obstruction.   LEFT: - There is no evidence of deep vein thrombosis in the lower extremity.  - No cystic structure found in the popliteal fossa.  *See table(s) above for measurements and observations. Electronically signed by Gerarda Fraction on 06/26/2023 at 4:48:08 PM.    Final    CT Ankle Left Wo Contrast Result Date: 06/26/2023 CLINICAL DATA:  Ankle fracture, postreduction. EXAM: CT OF THE LEFT ANKLE WITHOUT CONTRAST TECHNIQUE: Multidetector CT imaging of the left ankle was performed according to the standard protocol. Multiplanar CT image reconstructions were also generated. RADIATION DOSE REDUCTION: This exam was performed according to the departmental dose-optimization program which includes automated exposure control, adjustment of the mA and/or kV according to patient size  and/or use of iterative reconstruction technique. COMPARISON:  Pre and post reduction left ankle series earlier today. FINDINGS: Bones/Joint/Cartilage There is mild  osteopenia. This is more notable in the juxta-articular areas in the ankle and midfoot than elsewhere. Trimalleolar fracture. There is a longitudinal nondisplaced linear posterior malleolar fracture. Transverse oblique medial malleolar fracture is displaced about 1 cortex posteriorly and laterally with slight distraction of the anterior fracture margin. Distal fibular fracture extends through the lower diametaphysis with a spiral fracture and butterfly comminution fragment posteriorly. The main distal fragment displaced laterally by 1 cortex width. The butterfly fragment only minimally dorsally distracted. The lower fracture margin of the fibular fracture is through the level the distal tibiofibular syndesmosis above the level of the tibial plafond. There is less widening of the anterior joint space than previously with no evidence of dislocation. There is mild midfoot nonerosive arthrosis and mild to moderate nonerosive arthrosis of the ankle joint. In the visualized portion of the foot, there is also an accessory navicular bone and a bony bridge between the proximal shafts of the third and fourth metatarsals. Ligaments Suboptimally assessed by CT. Muscles and Tendons There is mild atrophy of the intrinsic foreleg musculature. More advanced atrophy of the intrinsic plantar foot muscles. No intramuscular hematoma is seen. Area tendons are grossly intact but not optimally studied with CT. Soft tissues Diffuse edema extends over the mid to distal foreleg and ankle and into the foot. No space-occupying hematoma or mass. There are dystrophic calcifications medial to the medial malleolus which are nonspecific but could be due to remote trauma or remote infection. No vascular calcifications are seen. There is no soft tissue gas. No visible ulcerations or  defects in the skin. IMPRESSION: 1. Trimalleolar fracture with less widening of the anterior joint space than previously. 2. Osteopenia and degenerative change. 3. Diffuse edema over the mid to distal foreleg and ankle and into the foot. No space-occupying hematoma or mass. 4. Muscle atrophy. 5. Dystrophic calcifications medial to the medial malleolus which are nonspecific but could be due to remote trauma or remote infection. Electronically Signed   By: Almira Bar M.D.   On: 06/26/2023 00:36   DG Ankle Left Port Result Date: 06/25/2023 CLINICAL DATA:  Ankle fracture EXAM: PORTABLE LEFT ANKLE - 2 VIEW COMPARISON:  06/25/2023 FINDINGS: Casting material limits osseous detail. Displaced distal fibular and medial malleolar fractures are again noted. Decreased posterior subluxation of the talus but with residual widening of the anterior joint space IMPRESSION: Casting material limits osseous detail. Displaced distal fibular and medial malleolar fractures. Decreased posterior subluxation of the talus but with residual widening of the anterior joint space. Electronically Signed   By: Jasmine Pang M.D.   On: 06/25/2023 19:16   CT Head Wo Contrast Result Date: 06/25/2023 CLINICAL DATA:  Mental status change EXAM: CT HEAD WITHOUT CONTRAST TECHNIQUE: Contiguous axial images were obtained from the base of the skull through the vertex without intravenous contrast. RADIATION DOSE REDUCTION: This exam was performed according to the departmental dose-optimization program which includes automated exposure control, adjustment of the mA and/or kV according to patient size and/or use of iterative reconstruction technique. COMPARISON:  Head CT 08/17/2021 FINDINGS: Brain: No evidence of acute infarction, hemorrhage, hydrocephalus, extra-axial collection or mass lesion/mass effect. Vascular: No hyperdense vessel or unexpected calcification. Skull: Normal. Negative for fracture or focal lesion. Sinuses/Orbits: There is mucosal  thickening of the paranasal sinuses diffusely. There is a small air-fluid level in the right maxillary sinus. Mastoid air cells are clear. Orbits are within normal limits. Other: None. IMPRESSION: 1. No acute intracranial process. 2. Paranasal sinus disease. Electronically Signed  By: Darliss Cheney M.D.   On: 06/25/2023 17:29   DG Ankle Complete Left Result Date: 06/25/2023 CLINICAL DATA:  Ankle pain and swelling EXAM: LEFT ANKLE COMPLETE - 3+ VIEW COMPARISON:  None Available. FINDINGS: Acute appearing fracture involving the distal shaft of the fibula with a bowel 1/4 shaft diameter lateral and posterior displacement of distal fracture fragment. Acute mildly displaced medial malleolar fracture. Mild posterior subluxation of the talar dome with respect to the distal tibia. Diffuse soft tissue swelling. IMPRESSION: Acute mildly displaced distal fibular shaft fracture. Acute mildly displaced medial malleolar fracture. Mild posterior subluxation of the talar dome with respect to the distal tibia. Electronically Signed   By: Jasmine Pang M.D.   On: 06/25/2023 16:03   DG Chest 2 View Result Date: 06/25/2023 CLINICAL DATA:  Suspected sepsis ankle swelling EXAM: CHEST - 2 VIEW COMPARISON:  01/19/2023 FINDINGS: Normal cardiac size. No significant effusion. No focal consolidation. Question nodular opacity at the posterior lung base on 1 of the 2 lateral images. IMPRESSION: No active cardiopulmonary disease. Question nodular opacity at the posterior lung base on 1 of the 2 lateral images. Consider further assessment with chest CT Electronically Signed   By: Jasmine Pang M.D.   On: 06/25/2023 16:00   (Echo, Carotid, EGD, Colonoscopy, ERCP)    Subjective: Patient seen in the morning rounds.  Denies any complaints.  Easily distracted and starts talking something irrelevant but he can be reoriented.   Discharge Exam: Vitals:   07/22/23 0442 07/22/23 0824  BP: 110/83 124/72  Pulse: 70 80  Resp: 18 20  Temp:  98.2 F (36.8 C) 97.9 F (36.6 C)  SpO2: 97% 99%   Vitals:   07/21/23 1539 07/21/23 1925 07/22/23 0442 07/22/23 0824  BP: (!) 142/78 108/75 110/83 124/72  Pulse: 80 88 70 80  Resp: 18 14 18 20   Temp: 98 F (36.7 C) 97.9 F (36.6 C) 98.2 F (36.8 C) 97.9 F (36.6 C)  TempSrc: Oral Oral  Oral  SpO2: 99% 95% 97% 99%  Weight:      Height:        General: Pt is alert, awake, not in acute distress Cardiovascular: RRR, S1/S2 +, no rubs, no gallops Respiratory: CTA bilaterally, no wheezing, no rhonchi Abdominal: Soft, NT, ND, bowel sounds + Extremities: Left foot on Ace wrap at rest.  He has bilateral leg mostly nonpitting edema. Patient is alert awake.  Mostly oriented.  Occasionally drags away from conversation.  Pleasant interaction.    The results of significant diagnostics from this hospitalization (including imaging, microbiology, ancillary and laboratory) are listed below for reference.     Microbiology: No results found for this or any previous visit (from the past 240 hours).   Labs: BNP (last 3 results) Recent Labs    01/19/23 2308 06/25/23 1441  BNP 15.6 66.8   Basic Metabolic Panel: No results for input(s): "NA", "K", "CL", "CO2", "GLUCOSE", "BUN", "CREATININE", "CALCIUM", "MG", "PHOS" in the last 168 hours. Liver Function Tests: No results for input(s): "AST", "ALT", "ALKPHOS", "BILITOT", "PROT", "ALBUMIN" in the last 168 hours. No results for input(s): "LIPASE", "AMYLASE" in the last 168 hours. No results for input(s): "AMMONIA" in the last 168 hours. CBC: No results for input(s): "WBC", "NEUTROABS", "HGB", "HCT", "MCV", "PLT" in the last 168 hours. Cardiac Enzymes: No results for input(s): "CKTOTAL", "CKMB", "CKMBINDEX", "TROPONINI" in the last 168 hours. BNP: Invalid input(s): "POCBNP" CBG: No results for input(s): "GLUCAP" in the last 168 hours. D-Dimer No results  for input(s): "DDIMER" in the last 72 hours. Hgb A1c No results for input(s):  "HGBA1C" in the last 72 hours. Lipid Profile No results for input(s): "CHOL", "HDL", "LDLCALC", "TRIG", "CHOLHDL", "LDLDIRECT" in the last 72 hours. Thyroid function studies No results for input(s): "TSH", "T4TOTAL", "T3FREE", "THYROIDAB" in the last 72 hours.  Invalid input(s): "FREET3" Anemia work up No results for input(s): "VITAMINB12", "FOLATE", "FERRITIN", "TIBC", "IRON", "RETICCTPCT" in the last 72 hours. Urinalysis    Component Value Date/Time   COLORURINE YELLOW 06/25/2023 1620   APPEARANCEUR HAZY (A) 06/25/2023 1620   LABSPEC 1.020 06/25/2023 1620   PHURINE 6.0 06/25/2023 1620   GLUCOSEU NEGATIVE 06/25/2023 1620   HGBUR SMALL (A) 06/25/2023 1620   BILIRUBINUR NEGATIVE 06/25/2023 1620   KETONESUR 40 (A) 06/25/2023 1620   PROTEINUR NEGATIVE 06/25/2023 1620   UROBILINOGEN 0.2 05/27/2022 1536   NITRITE POSITIVE (A) 06/25/2023 1620   LEUKOCYTESUR MODERATE (A) 06/25/2023 1620   Sepsis Labs No results for input(s): "WBC" in the last 168 hours.  Invalid input(s): "PROCALCITONIN", "LACTICIDVEN" Microbiology No results found for this or any previous visit (from the past 240 hours).   Time coordinating discharge: 35 minutes  SIGNED:   Dorcas Carrow, MD  [facility] 07/22/2023, 11:30 AM

## 2023-07-21 NOTE — TOC Progression Note (Signed)
 Transition of Care [facility]) - Progression Note    Patient Details  Name: ELMORE HYSLOP MRN: [id number] Date of Birth: October 31, 1955  Transition of Care [facility]) CM/SW Contact  Lorri Frederick, LCSW Phone Number: 07/21/2023, 12:29 PM  Clinical Narrative:   Message Camillie/DSS: she is at a meeting until 2pm today.  She has received the admission paperwork.  1230: Message from Deborah/[facility]: Camille/DSS will not be able to complete admission paperwork until tomorrow.  They cannot receive pt until they have it.     Expected Discharge Plan: Skilled Nursing Facility Barriers to Discharge: Continued Medical Work up, English as a second language teacher, SNF Pending bed offer  Expected Discharge Plan and Services In-house Referral: Clinical Social Work   Post Acute Care Choice: Skilled Nursing Facility Living arrangements for the past 2 months: Single Family Home Expected Discharge Date: 07/21/23                                     Social Determinants of Health (SDOH) Interventions SDOH Screenings   Food Insecurity: Patient Declined (06/26/2023)  Housing: Patient Declined (06/26/2023)  Transportation Needs: Patient Declined (06/26/2023)  Utilities: Patient Declined (06/26/2023)  Social Connections: Unknown (06/26/2023)  Tobacco Use: Medium Risk (07/01/2023)    Readmission Risk Interventions     No data to display

## 2023-07-21 NOTE — Progress Notes (Signed)
 Physical Therapy Treatment Patient Details Name: Timothy [name] MRN: [id number] DOB: 1955/08/27 Today's Date: 07/21/2023   History of Present Illness 68 y/o male presents to [facility] ED on 2/6 for bilateral leg swelling/pain, redness and swelling noted to L>R. Imaging findings result in trimalleolar fx of the L ankle, mildly displaced distal fibular shaft fx, mild posterior subluxation of talar dome. S/p ORIF of L trimalleolar ankle fx and internal fixation of L syndesmosis 2/12. PMH includes history of schizophrenia, chronic neurogenic bladder with a history of self catheterizations, chronic fecal incontinence, history of chronic back pain, prostate cx w/ mets to spine.    PT Comments  Patient is agreeable to PT session. He is tangential at times with frequent cues for attention to task. He has difficulty maintaining NWB of LLE with transfer despite cues for technique. Min A required for incremental lateral scooting. Recommend to continue PT to maximize independence. Rehabilitation < 3 hours/day recommended after this hospital stay.    If plan is discharge home, recommend the following: Two people to help with walking and/or transfers;Two people to help with bathing/dressing/bathroom;Direct supervision/assist for medications management;Help with stairs or ramp for entrance;Assist for transportation;Assistance with cooking/housework;Direct supervision/assist for financial management;Supervision due to cognitive status   Can travel by private vehicle     No  Equipment Recommendations  None recommended by PT    Recommendations for Other Services       Precautions / Restrictions Precautions Precautions: Fall Recall of Precautions/Restrictions: Impaired Precaution/Restrictions Comments: pt is unable to recall or adhere to NWB L LE Required Braces or Orthoses: Other Brace Other Brace: CAM walker OOB L LE Restrictions Weight Bearing Restrictions Per Provider Order: Yes LLE Weight Bearing Per  Provider Order: Non weight bearing     Mobility  Bed Mobility Overal bed mobility: Needs Assistance Bed Mobility: Sit to Supine, Supine to Sit     Supine to sit: Contact guard Sit to supine: Min assist   General bed mobility comments: cues for technique. impulsive at times with mobility efforts    Transfers Overall transfer level: Needs assistance Equipment used: None              Lateral/Scoot Transfers: Min assist General transfer comment: Min A for incremental lateral scooting to the right x 4 bouts. despite cues for LLE positioning and technique to maintain NWB, patient is unable to fully maintain NWB of LLE during transfer efforts. CAM boot in place with transfers    Ambulation/Gait                   Stairs             Wheelchair Mobility     Tilt Bed    Modified Rankin (Stroke Patients Only)       Balance Overall balance assessment: Needs assistance Sitting-balance support: No upper extremity supported, Feet supported Sitting balance-Leahy Scale: Fair                                      Hotel manager: No apparent difficulties  Cognition Arousal: Alert Behavior During Therapy: Flat affect   PT - Cognitive impairments: Memory, Attention, Sequencing, Problem solving, Safety/Judgement                       PT - Cognition Comments: poor insight into deficits with limited recall of weight bearing restrictions. frequent safety cues required  with mobility efforts. tangential with cues required for attention to task Following commands: Impaired Following commands impaired: Follows one step commands with increased time    Cueing Cueing Techniques: Verbal cues, Tactile cues, Visual cues  Exercises General Exercises - Lower Extremity Long Arc Quad: AROM, AAROM, Strengthening, Left, 5 reps, Seated    General Comments        Pertinent Vitals/Pain Pain Assessment Pain Assessment:  No/denies pain    Home Living                          Prior Function            PT Goals (current goals can now be found in the care plan section) Acute Rehab PT Goals Patient Stated Goal: To be able to walk so I can go home PT Goal Formulation: With patient Time For Goal Achievement: 07/27/23 Potential to Achieve Goals: Fair Progress towards PT goals: Progressing toward goals    Frequency    Min 1X/week      PT Plan      Co-evaluation              AM-PAC PT "6 Clicks" Mobility   Outcome Measure  Help needed turning from your back to your side while in a flat bed without using bedrails?: A Little Help needed moving from lying on your back to sitting on the side of a flat bed without using bedrails?: A Little Help needed moving to and from a bed to a chair (including a wheelchair)?: A Lot Help needed standing up from a chair using your arms (e.g., wheelchair or bedside chair)?: A Lot Help needed to walk in hospital room?: Total Help needed climbing 3-5 steps with a railing? : Total 6 Click Score: 12    End of Session   Activity Tolerance: Patient tolerated treatment well Patient left: in bed;with call bell/phone within reach;with bed alarm set (he preferes to leave CAM boot on with LLE elevated on pillow)   PT Visit Diagnosis: Other abnormalities of gait and mobility (R26.89);History of falling (Z91.81);Muscle weakness (generalized) (M62.81)     Time: 1610-9604 PT Time Calculation (min) (ACUTE ONLY): 26 min  Charges:    $Therapeutic Activity: 23-37 mins PT General Charges $$ ACUTE PT VISIT: 1 Visit                     Donna Bernard, PT, MPT    Timothy [name] 07/21/2023, 3:18 PM

## 2023-07-22 DIAGNOSIS — S82852A Displaced trimalleolar fracture of left lower leg, initial encounter for closed fracture: Principal | ICD-10-CM

## 2023-07-22 NOTE — Progress Notes (Signed)
 Informed by TOC that he is ready to go to SNF this am Came and examined at the bedside Is alert awake oriented communicative.  About to transfer to bedside chair No new complaints. No change in discharge summary completed on 07/21/23  by Dr. Dina Rich and reviewed and agree with change in date of discharge as 07/22/2023.  Patient has no questions.  He is stable for discharge to SNF today

## 2023-07-22 NOTE — Plan of Care (Signed)

## 2023-07-22 NOTE — TOC Progression Note (Signed)
 Transition of Care [facility]) - Progression Note    Patient Details  Name: Timothy [name] MRN: [id number] Date of Birth: Mar 07, 1956  Transition of Care [facility]) CM/SW Contact  Lorri Frederick, LCSW Phone Number: 07/22/2023, 8:35 AM  Clinical Narrative:   Message from Camillie/APS and from [facility] confirming admission paperwork is done and pt can DC to [place].  MD informed.     Expected Discharge Plan: Skilled Nursing Facility Barriers to Discharge: Continued Medical Work up, English as a second language teacher, SNF Pending bed offer  Expected Discharge Plan and Services In-house Referral: Clinical Social Work   Post Acute Care Choice: Skilled Nursing Facility Living arrangements for the past 2 months: Single Family Home Expected Discharge Date: 07/21/23                                     Social Determinants of Health (SDOH) Interventions SDOH Screenings   Food Insecurity: Patient Declined (06/26/2023)  Housing: Patient Declined (06/26/2023)  Transportation Needs: Patient Declined (06/26/2023)  Utilities: Patient Declined (06/26/2023)  Social Connections: Unknown (06/26/2023)  Tobacco Use: Medium Risk (07/01/2023)    Readmission Risk Interventions     No data to display

## 2023-07-22 NOTE — Plan of Care (Signed)

## 2023-07-22 NOTE — TOC Transition Note (Signed)
 Transition of Care [facility]) - Discharge Note   Patient Details  Name: Timothy [name] MRN: [id number] Date of Birth: 25-Feb-1956  [facility]) CM/SW Contact:  Lorri Frederick, LCSW Phone Number: 07/22/2023, 11:43 AM   Clinical Narrative:   Pt discharging to [facility]  RN call report to [phone].    Final next level of care: Skilled Nursing Facility Barriers to Discharge: Barriers Resolved   Patient Goals and CMS Choice Patient states their goals for this hospitalization and ongoing recovery are:: get better          Discharge Placement              Patient chooses bed at:  [facility]) Patient to be transferred to facility by: ptar Name of family member notified: left message with sister [name]. Legal guardian [name] also aware Patient and family notified of of transfer: 07/22/23  Discharge Plan and Services Additional resources added to the After Visit Summary for   In-house Referral: Clinical Social Work   Post Acute Care Choice: Skilled Nursing Facility                               Social Drivers of Health (SDOH) Interventions SDOH Screenings   Food Insecurity: Patient Declined (06/26/2023)  Housing: Patient Declined (06/26/2023)  Transportation Needs: Patient Declined (06/26/2023)  Utilities: Patient Declined (06/26/2023)  Social Connections: Unknown (06/26/2023)  Tobacco Use: Medium Risk (07/01/2023)     Readmission Risk Interventions     No data to display

## 2023-08-04 DIAGNOSIS — S93432D Sprain of tibiofibular ligament of left ankle, subsequent encounter: Secondary | ICD-10-CM | POA: Diagnosis not present

## 2023-08-04 DIAGNOSIS — M25511 Pain in right shoulder: Secondary | ICD-10-CM | POA: Diagnosis not present

## 2023-08-04 DIAGNOSIS — S82852D Displaced trimalleolar fracture of left lower leg, subsequent encounter for closed fracture with routine healing: Secondary | ICD-10-CM | POA: Diagnosis not present

## 2023-09-21 DIAGNOSIS — S93432D Sprain of tibiofibular ligament of left ankle, subsequent encounter: Secondary | ICD-10-CM | POA: Diagnosis not present

## 2023-09-21 DIAGNOSIS — S82852D Displaced trimalleolar fracture of left lower leg, subsequent encounter for closed fracture with routine healing: Secondary | ICD-10-CM | POA: Diagnosis not present

## 2024-01-29 DIAGNOSIS — H538 Other visual disturbances: Secondary | ICD-10-CM | POA: Diagnosis not present
